# Patient Record
Sex: Male | Born: 1955 | Race: Black or African American | Hispanic: No | Smoking: Current every day smoker
Health system: Southern US, Community
[De-identification: ages and names within clinical notes are randomized; demographics above are authoritative.]

## PROBLEM LIST (undated history)

## (undated) DIAGNOSIS — B9681 Helicobacter pylori [H. pylori] as the cause of diseases classified elsewhere: Secondary | ICD-10-CM

## (undated) DIAGNOSIS — E785 Hyperlipidemia, unspecified: Secondary | ICD-10-CM

## (undated) DIAGNOSIS — M1990 Unspecified osteoarthritis, unspecified site: Secondary | ICD-10-CM

## (undated) DIAGNOSIS — E119 Type 2 diabetes mellitus without complications: Secondary | ICD-10-CM

## (undated) DIAGNOSIS — I10 Essential (primary) hypertension: Secondary | ICD-10-CM

## (undated) DIAGNOSIS — B1920 Unspecified viral hepatitis C without hepatic coma: Secondary | ICD-10-CM

## (undated) DIAGNOSIS — J42 Unspecified chronic bronchitis: Secondary | ICD-10-CM

## (undated) DIAGNOSIS — E871 Hypo-osmolality and hyponatremia: Secondary | ICD-10-CM

## (undated) DIAGNOSIS — K5900 Constipation, unspecified: Secondary | ICD-10-CM

## (undated) DIAGNOSIS — K652 Spontaneous bacterial peritonitis: Secondary | ICD-10-CM

## (undated) DIAGNOSIS — J189 Pneumonia, unspecified organism: Secondary | ICD-10-CM

## (undated) DIAGNOSIS — K219 Gastro-esophageal reflux disease without esophagitis: Secondary | ICD-10-CM

## (undated) DIAGNOSIS — R519 Headache, unspecified: Secondary | ICD-10-CM

## (undated) DIAGNOSIS — D689 Coagulation defect, unspecified: Secondary | ICD-10-CM

## (undated) DIAGNOSIS — D649 Anemia, unspecified: Secondary | ICD-10-CM

## (undated) DIAGNOSIS — K279 Peptic ulcer, site unspecified, unspecified as acute or chronic, without hemorrhage or perforation: Secondary | ICD-10-CM

## (undated) DIAGNOSIS — D62 Acute posthemorrhagic anemia: Secondary | ICD-10-CM

## (undated) DIAGNOSIS — R188 Other ascites: Secondary | ICD-10-CM

## (undated) DIAGNOSIS — T148XXA Other injury of unspecified body region, initial encounter: Secondary | ICD-10-CM

## (undated) DIAGNOSIS — IMO0002 Reserved for concepts with insufficient information to code with codable children: Secondary | ICD-10-CM

## (undated) DIAGNOSIS — F1010 Alcohol abuse, uncomplicated: Secondary | ICD-10-CM

## (undated) DIAGNOSIS — K254 Chronic or unspecified gastric ulcer with hemorrhage: Secondary | ICD-10-CM

## (undated) DIAGNOSIS — R51 Headache: Secondary | ICD-10-CM

## (undated) DIAGNOSIS — K7460 Unspecified cirrhosis of liver: Secondary | ICD-10-CM

## (undated) HISTORY — PX: TONSILLECTOMY: SUR1361

## (undated) HISTORY — PX: PARACENTESIS: SHX844

---
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
## 1999-02-21 ENCOUNTER — Encounter: Payer: Self-pay | Admitting: Family Medicine

## 1999-02-21 ENCOUNTER — Ambulatory Visit (HOSPITAL_COMMUNITY): Admission: RE | Admit: 1999-02-21 | Discharge: 1999-02-21 | Payer: Self-pay | Admitting: Family Medicine

## 2001-08-31 ENCOUNTER — Emergency Department (HOSPITAL_COMMUNITY): Admission: EM | Admit: 2001-08-31 | Discharge: 2001-08-31 | Payer: Self-pay

## 2001-10-08 ENCOUNTER — Encounter: Payer: Self-pay | Admitting: Emergency Medicine

## 2001-10-08 ENCOUNTER — Emergency Department (HOSPITAL_COMMUNITY): Admission: EM | Admit: 2001-10-08 | Discharge: 2001-10-08 | Payer: Self-pay | Admitting: Emergency Medicine

## 2002-07-30 ENCOUNTER — Emergency Department (HOSPITAL_COMMUNITY): Admission: EM | Admit: 2002-07-30 | Discharge: 2002-07-30 | Payer: Self-pay | Admitting: Emergency Medicine

## 2003-08-22 ENCOUNTER — Emergency Department (HOSPITAL_COMMUNITY): Admission: EM | Admit: 2003-08-22 | Discharge: 2003-08-22 | Payer: Self-pay

## 2003-09-15 ENCOUNTER — Emergency Department (HOSPITAL_COMMUNITY): Admission: EM | Admit: 2003-09-15 | Discharge: 2003-09-15 | Payer: Self-pay | Admitting: Emergency Medicine

## 2004-05-06 ENCOUNTER — Emergency Department (HOSPITAL_COMMUNITY): Admission: EM | Admit: 2004-05-06 | Discharge: 2004-05-06 | Payer: Self-pay | Admitting: Emergency Medicine

## 2012-04-22 ENCOUNTER — Encounter (HOSPITAL_COMMUNITY): Payer: Self-pay | Admitting: *Deleted

## 2012-04-22 ENCOUNTER — Emergency Department (HOSPITAL_COMMUNITY)
Admission: EM | Admit: 2012-04-22 | Discharge: 2012-04-22 | Disposition: A | Discharge status: Home / Self Care | Payer: Self-pay | Attending: Emergency Medicine | Admitting: Emergency Medicine

## 2012-04-22 DIAGNOSIS — R000 Tachycardia, unspecified: Secondary | ICD-10-CM | POA: Insufficient documentation

## 2012-04-22 DIAGNOSIS — R5381 Other malaise: Secondary | ICD-10-CM | POA: Insufficient documentation

## 2012-04-22 DIAGNOSIS — F17200 Nicotine dependence, unspecified, uncomplicated: Secondary | ICD-10-CM | POA: Insufficient documentation

## 2012-04-22 DIAGNOSIS — E119 Type 2 diabetes mellitus without complications: Secondary | ICD-10-CM | POA: Insufficient documentation

## 2012-04-22 DIAGNOSIS — R739 Hyperglycemia, unspecified: Secondary | ICD-10-CM

## 2012-04-22 DIAGNOSIS — I10 Essential (primary) hypertension: Secondary | ICD-10-CM | POA: Insufficient documentation

## 2012-04-22 DIAGNOSIS — Z7982 Long term (current) use of aspirin: Secondary | ICD-10-CM | POA: Insufficient documentation

## 2012-04-22 DIAGNOSIS — Z79899 Other long term (current) drug therapy: Secondary | ICD-10-CM | POA: Insufficient documentation

## 2012-04-22 HISTORY — DX: Essential (primary) hypertension: I10

## 2012-04-22 LAB — GLUCOSE, CAPILLARY: Glucose-Capillary: 224 mg/dL — ABNORMAL HIGH (ref 70–99)

## 2012-04-22 LAB — COMPREHENSIVE METABOLIC PANEL WITH GFR
ALT: 96 U/L — ABNORMAL HIGH (ref 0–53)
AST: 88 U/L — ABNORMAL HIGH (ref 0–37)
Albumin: 3 g/dL — ABNORMAL LOW (ref 3.5–5.2)
Alkaline Phosphatase: 398 U/L — ABNORMAL HIGH (ref 39–117)
BUN: 5 mg/dL — ABNORMAL LOW (ref 6–23)
CO2: 21 meq/L (ref 19–32)
Calcium: 9.2 mg/dL (ref 8.4–10.5)
Chloride: 99 meq/L (ref 96–112)
Creatinine, Ser: 0.68 mg/dL (ref 0.50–1.35)
GFR calc Af Amer: >90 mL/min
GFR calc non Af Amer: >90 mL/min
Glucose, Bld: 407 mg/dL — ABNORMAL HIGH (ref 70–99)
Potassium: 4.2 meq/L (ref 3.5–5.1)
Sodium: 134 meq/L — ABNORMAL LOW (ref 135–145)
Total Bilirubin: 0.7 mg/dL (ref 0.3–1.2)
Total Protein: 7 g/dL (ref 6.0–8.3)

## 2012-04-22 LAB — CBC WITH DIFFERENTIAL/PLATELET
Basophils Absolute: 0.1 10*3/uL (ref 0.0–0.1)
Basophils Relative: 1 % (ref 0–1)
Eosinophils Absolute: 0.2 10*3/uL (ref 0.0–0.7)
Eosinophils Relative: 2 % (ref 0–5)
HCT: 41.5 % (ref 39.0–52.0)
Hemoglobin: 14.8 g/dL (ref 13.0–17.0)
Lymphocytes Relative: 40 % (ref 12–46)
Lymphs Abs: 3.2 10*3/uL (ref 0.7–4.0)
MCH: 29.9 pg (ref 26.0–34.0)
MCHC: 35.7 g/dL (ref 30.0–36.0)
MCV: 83.8 fL (ref 78.0–100.0)
Monocytes Absolute: 0.7 10*3/uL (ref 0.1–1.0)
Monocytes Relative: 8 % (ref 3–12)
Neutro Abs: 3.9 10*3/uL (ref 1.7–7.7)
Neutrophils Relative %: 48 % (ref 43–77)
Platelets: 111 10*3/uL — ABNORMAL LOW (ref 150–400)
RBC: 4.95 MIL/uL (ref 4.22–5.81)
RDW: 14.9 % (ref 11.5–15.5)
WBC: 8 10*3/uL (ref 4.0–10.5)

## 2012-04-22 LAB — URINALYSIS, ROUTINE W REFLEX MICROSCOPIC
Bilirubin Urine: NEGATIVE
Glucose, UA: >1000 mg/dL — AB
Hgb urine dipstick: NEGATIVE
Ketones, ur: NEGATIVE mg/dL
pH: 5.5 (ref 5.0–8.0)

## 2012-04-22 LAB — URINE MICROSCOPIC-ADD ON

## 2012-04-22 MED ORDER — SODIUM CHLORIDE 0.9 % IV BOLUS (SEPSIS)
1000.0000 mL | Freq: Once | INTRAVENOUS | Status: AC
  Administered 2012-04-22: 1000 mL via INTRAVENOUS

## 2012-04-22 NOTE — ED Notes (Signed)
Patient states dizziness and weakness this am, patient seen at PCP and [facility]

## 2012-04-22 NOTE — ED Provider Notes (Signed)
History     CSN: [id number]  Arrival date & time 04/22/12  1315   First MD Initiated Contact with Patient 04/22/12 1553      Chief Complaint  Patient presents with  . Hyperglycemia    HPI  The patient presents with generalized complaints.  He notes that today, he gradually became aware of general weakness, mild disequilibrium.  The sensation persisted after the patient took a nap.  He had his blood sugar checked, found the elevated results concerning, and presents for evaluation.  During my evaluation he has no ongoing complaints and chronic lower extremity pain, attributed to neuropathy.  He currently denies any fevers, chills, nausea, vomiting, confusion, disequilibrium, disorientation or any other pain.  Past Medical History  Diagnosis Date  . Hypertension   . Diabetes mellitus without complication   . Hepatitis     Past Surgical History  Procedure Date  . Hernia repair   . Tonsillectomy     No family history on file.  History  Substance Use Topics  . Smoking status: Current Every Day Smoker  . Smokeless tobacco: Not on file  . Alcohol Use: Yes     socially      Review of Systems  Constitutional:       Per HPI, otherwise negative  HENT:       Per HPI, otherwise negative  Eyes: Negative.   Respiratory:       Per HPI, otherwise negative  Cardiovascular:       Per HPI, otherwise negative  Gastrointestinal: Negative for vomiting.  Genitourinary: Negative.   Musculoskeletal:       Per HPI, otherwise negative  Skin: Negative.   Neurological: Negative for syncope.    Allergies  Penicillins  Home Medications   Current Outpatient Rx  Name Route Sig Dispense Refill  . ACETAMINOPHEN 500 MG PO TABS Oral Take 500 mg by mouth every 8 (eight) hours as needed. For pain    . ASPIRIN EC 81 MG PO TBEC Oral Take 81 mg by mouth daily.    . OMEGA-3 FATTY ACIDS 1000 MG PO CAPS Oral Take 1 g by mouth 2 (two) times daily.    Marland Kitchen GABAPENTIN 300 MG PO CAPS Oral Take 600  mg by mouth 3 (three) times daily.    . GLYBURIDE 5 MG PO TABS Oral Take 10 mg by mouth 2 (two) times daily with a meal.    . LISINOPRIL 20 MG PO TABS Oral Take 10 mg by mouth daily.    Marland Kitchen METFORMIN HCL 1000 MG PO TABS Oral Take 1,000 mg by mouth 2 (two) times daily with a meal.    . POLYVINYL ALCOHOL 1.4 % OP SOLN Both Eyes Place 1 drop into both eyes daily as needed.    Marland Kitchen SILDENAFIL CITRATE 100 MG PO TABS Oral Take 50 mg by mouth daily as needed.      BP 109/78  Pulse 105  Temp 98.1 F (36.7 C) (Oral)  Resp 16  SpO2 99%  Physical Exam  Nursing note and vitals reviewed. Constitutional: He is oriented to person, place, and time. He appears well-developed. No distress.  HENT:  Head: Normocephalic and atraumatic.  Eyes: Conjunctivae normal and EOM are normal.  Cardiovascular: Regular rhythm.  Tachycardia present.   Pulmonary/Chest: Effort normal. No stridor. No respiratory distress.  Abdominal: He exhibits no distension.  Musculoskeletal: He exhibits no edema.  Neurological: He is alert and oriented to person, place, and time.  Skin: Skin is warm and  dry.  Psychiatric: He has a normal mood and affect.    ED Course  Procedures (including critical care time)  Labs Reviewed  CBC WITH DIFFERENTIAL - Abnormal; Notable for the following:    Platelets 111 (*)     All other components within normal limits  COMPREHENSIVE METABOLIC PANEL - Abnormal; Notable for the following:    Sodium 134 (*)     Glucose, Bld 407 (*)     BUN 5 (*)     Albumin 3.0 (*)     AST 88 (*)     ALT 96 (*)     Alkaline Phosphatase 398 (*)     All other components within normal limits  GLUCOSE, CAPILLARY - Abnormal; Notable for the following:    Glucose-Capillary 357 (*)     All other components within normal limits  GLUCOSE, CAPILLARY - Abnormal; Notable for the following:    Glucose-Capillary 270 (*)     All other components within normal limits  URINALYSIS, ROUTINE W REFLEX MICROSCOPIC   No results  found.   No diagnosis found.  Cardiac 101 sinus tach abnormal Pulse ox 99% room air normal   Date: 04/22/2012  Rate: 102  Rhythm: sinus tachycardia  QRS Axis: normal  Intervals: normal  ST/T Wave abnormalities: normal  Conduction Disutrbances:none  Narrative Interpretation:   Old EKG Reviewed: none available BORDERLINE   MDM  The patient presents with generalized complaints, and on initial exam is notably hyperglycemic.  Per the patient's ED stay he had no new events, noted that he felt better.  He repeat blood check demonstrated the patient's glucose was significantly more appropriate.  His muscles on the need for ongoing medication compliance, PND followup.  Absent distress, notable vital sign abnormalities, ongoing complaints, and with the established history of non-insulin-dependent diabetes the patient was discharged with expectation of close followup.    Gerhard Munch, MD 04/22/12 1800

## 2012-08-09 ENCOUNTER — Encounter (HOSPITAL_COMMUNITY): Payer: Self-pay | Admitting: Emergency Medicine

## 2012-08-09 ENCOUNTER — Emergency Department (HOSPITAL_COMMUNITY)
Admission: EM | Admit: 2012-08-09 | Discharge: 2012-08-10 | Disposition: A | Discharge status: Home / Self Care | Payer: Self-pay | Attending: Emergency Medicine | Admitting: Emergency Medicine

## 2012-08-09 DIAGNOSIS — N390 Urinary tract infection, site not specified: Secondary | ICD-10-CM | POA: Insufficient documentation

## 2012-08-09 DIAGNOSIS — E860 Dehydration: Secondary | ICD-10-CM | POA: Insufficient documentation

## 2012-08-09 DIAGNOSIS — F17200 Nicotine dependence, unspecified, uncomplicated: Secondary | ICD-10-CM | POA: Insufficient documentation

## 2012-08-09 DIAGNOSIS — R1013 Epigastric pain: Secondary | ICD-10-CM | POA: Insufficient documentation

## 2012-08-09 DIAGNOSIS — E119 Type 2 diabetes mellitus without complications: Secondary | ICD-10-CM | POA: Insufficient documentation

## 2012-08-09 DIAGNOSIS — J029 Acute pharyngitis, unspecified: Secondary | ICD-10-CM | POA: Insufficient documentation

## 2012-08-09 DIAGNOSIS — Z7982 Long term (current) use of aspirin: Secondary | ICD-10-CM | POA: Insufficient documentation

## 2012-08-09 DIAGNOSIS — Z79899 Other long term (current) drug therapy: Secondary | ICD-10-CM | POA: Insufficient documentation

## 2012-08-09 DIAGNOSIS — I10 Essential (primary) hypertension: Secondary | ICD-10-CM | POA: Insufficient documentation

## 2012-08-09 DIAGNOSIS — R112 Nausea with vomiting, unspecified: Secondary | ICD-10-CM | POA: Insufficient documentation

## 2012-08-09 LAB — URINALYSIS, ROUTINE W REFLEX MICROSCOPIC
Glucose, UA: 250 mg/dL — AB
Hgb urine dipstick: NEGATIVE
Specific Gravity, Urine: 1.033 — ABNORMAL HIGH (ref 1.005–1.030)

## 2012-08-09 LAB — COMPREHENSIVE METABOLIC PANEL
ALT: 120 U/L — ABNORMAL HIGH (ref 0–53)
AST: 142 U/L — ABNORMAL HIGH (ref 0–37)
CO2: 31 mEq/L (ref 19–32)
Calcium: 9.7 mg/dL (ref 8.4–10.5)
GFR calc non Af Amer: >90 mL/min (ref >90)
Sodium: 135 mEq/L (ref 135–145)

## 2012-08-09 LAB — CBC WITH DIFFERENTIAL/PLATELET
Basophils Absolute: 0.1 10*3/uL (ref 0.0–0.1)
Eosinophils Relative: 0 % (ref 0–5)
Lymphocytes Relative: 39 % (ref 12–46)
MCV: 83.4 fL (ref 78.0–100.0)
Neutro Abs: 4.9 10*3/uL (ref 1.7–7.7)
Neutrophils Relative %: 46 % (ref 43–77)
Platelets: 121 10*3/uL — ABNORMAL LOW (ref 150–400)
RDW: 15.4 % (ref 11.5–15.5)
WBC: 10.6 10*3/uL — ABNORMAL HIGH (ref 4.0–10.5)

## 2012-08-09 LAB — GLUCOSE, CAPILLARY: Glucose-Capillary: 245 mg/dL — ABNORMAL HIGH (ref 70–99)

## 2012-08-09 LAB — URINE MICROSCOPIC-ADD ON

## 2012-08-09 MED ORDER — DEXTROSE 5 % IV SOLN
1.0000 g | Freq: Once | INTRAVENOUS | Status: AC
  Administered 2012-08-09: 1 g via INTRAVENOUS
  Filled 2012-08-09: qty 10

## 2012-08-09 MED ORDER — IOHEXOL 300 MG/ML  SOLN
50.0000 mL | Freq: Once | INTRAMUSCULAR | Status: AC | PRN
  Administered 2012-08-09: 50 mL via ORAL

## 2012-08-09 MED ORDER — SODIUM CHLORIDE 0.9 % IV SOLN
INTRAVENOUS | Status: DC
  Administered 2012-08-09: 23:00:00 via INTRAVENOUS

## 2012-08-09 MED ORDER — SODIUM CHLORIDE 0.9 % IV BOLUS (SEPSIS)
1000.0000 mL | Freq: Once | INTRAVENOUS | Status: AC
  Administered 2012-08-09: 1000 mL via INTRAVENOUS

## 2012-08-09 MED ORDER — IOHEXOL 300 MG/ML  SOLN: 100.0000 mL | Freq: Once | INTRAMUSCULAR | Status: AC | PRN

## 2012-08-09 MED ORDER — ONDANSETRON HCL 4 MG/2ML IJ SOLN
4.0000 mg | Freq: Once | INTRAMUSCULAR | Status: AC
  Administered 2012-08-09: 4 mg via INTRAVENOUS
  Filled 2012-08-09: qty 2

## 2012-08-09 MED ORDER — SODIUM CHLORIDE 0.9 % IV BOLUS (SEPSIS)
500.0000 mL | Freq: Once | INTRAVENOUS | Status: AC
  Administered 2012-08-09: 500 mL via INTRAVENOUS

## 2012-08-09 NOTE — ED Notes (Signed)
Pt given water to drink PO.

## 2012-08-09 NOTE — ED Notes (Signed)
Pt made aware need for urine sample. Reports he hasn't been able to urinate all day but will try in a little.

## 2012-08-09 NOTE — ED Provider Notes (Signed)
History     CSN: [id number]  Arrival date & time 08/09/12  1843   First MD Initiated Contact with Patient 08/09/12 1854      Chief Complaint  Patient presents with  . Emesis    (Consider location/radiation/quality/duration/timing/severity/associated sxs/prior treatment) HPI Comments: Patient with h/o DM controlled on metformin, hepatitis -- presents with complaint of 3 days of persistent nausea and vomiting. Patient states that he is been unable to keep down solids or liquids other than a popsicle. He states that he has epigastric pain and throat pain from vomiting. Vomit has been clear. Patient had a BM yesterday and continues to pass gas.  Patient denies fever, cold symptoms, chest pain, shortness of breath, urinary symptoms. Denies GERD symptoms. No treatments prior to arrival. Patient states he has been unable to keep down his diabetic medications. Course is constant. Onset was acute. Nothing makes symptoms better or worse  The history is provided by the patient.    Past Medical History  Diagnosis Date  . Hypertension   . Diabetes mellitus without complication   . Hepatitis     Past Surgical History  Procedure Date  . Hernia repair   . Tonsillectomy     No family history on file.  History  Substance Use Topics  . Smoking status: Current Every Day Smoker  . Smokeless tobacco: Not on file  . Alcohol Use: Yes     Comment: socially      Review of Systems  Constitutional: Negative for fever.  HENT: Positive for sore throat. Negative for rhinorrhea.   Eyes: Negative for redness.  Respiratory: Negative for cough, choking and shortness of breath.   Cardiovascular: Negative for chest pain and leg swelling.  Gastrointestinal: Positive for nausea, vomiting and abdominal pain. Negative for diarrhea, constipation and blood in stool.  Genitourinary: Negative for dysuria.  Musculoskeletal: Negative for myalgias.  Skin: Negative for rash.  Neurological: Negative for  headaches.    Allergies  Penicillins  Home Medications   Current Outpatient Rx  Name  Route  Sig  Dispense  Refill  . ACETAMINOPHEN 500 MG PO TABS   Oral   Take 500 mg by mouth every 8 (eight) hours as needed. For pain         . ASPIRIN EC 81 MG PO TBEC   Oral   Take 81 mg by mouth daily.         . OMEGA-3 FATTY ACIDS 1000 MG PO CAPS   Oral   Take 1 g by mouth 2 (two) times daily.         Marland Kitchen GABAPENTIN 300 MG PO CAPS   Oral   Take 600 mg by mouth 3 (three) times daily.         . GLYBURIDE 5 MG PO TABS   Oral   Take 10 mg by mouth 2 (two) times daily with a meal.         . LISINOPRIL 20 MG PO TABS   Oral   Take 10 mg by mouth daily.         Marland Kitchen METFORMIN HCL 1000 MG PO TABS   Oral   Take 1,000 mg by mouth 2 (two) times daily with a meal.         . POLYVINYL ALCOHOL 1.4 % OP SOLN   Both Eyes   Place 1 drop into both eyes daily as needed.         Marland Kitchen SILDENAFIL CITRATE 100 MG PO TABS  Oral   Take 50 mg by mouth daily as needed.           BP 123/96  Pulse 121  Temp 98.3 F (36.8 C) (Oral)  Resp 16  Ht 5\' 9"  (1.753 m)  Wt 175 lb (79.379 kg)  BMI 25.84 kg/m2  SpO2 96%  Physical Exam  Nursing note and vitals reviewed. Constitutional: He appears well-developed and well-nourished.  HENT:  Head: Normocephalic and atraumatic.  Eyes: Conjunctivae normal are normal. Right eye exhibits no discharge. Left eye exhibits no discharge.  Neck: Normal range of motion. Neck supple.  Cardiovascular: Normal rate, regular rhythm and normal heart sounds.   Pulmonary/Chest: Effort normal and breath sounds normal.  Abdominal: Soft. Bowel sounds are normal. He exhibits no distension. There is tenderness. There is no rebound, no guarding and no CVA tenderness.    Neurological: He is alert.  Skin: Skin is warm and dry.  Psychiatric: He has a normal mood and affect.    ED Course  Procedures (including critical care time)  Labs Reviewed  GLUCOSE, CAPILLARY  - Abnormal; Notable for the following:    Glucose-Capillary 245 (*)     All other components within normal limits  CBC WITH DIFFERENTIAL - Abnormal; Notable for the following:    WBC 10.6 (*)     Platelets 121 (*)     Lymphs Abs 4.1 (*)     Monocytes Relative 14 (*)     Monocytes Absolute 1.5 (*)     All other components within normal limits  COMPREHENSIVE METABOLIC PANEL - Abnormal; Notable for the following:    Potassium 3.2 (*)     Chloride 94 (*)     Glucose, Bld 260 (*)     Albumin 3.2 (*)     AST 142 (*)     ALT 120 (*)     Alkaline Phosphatase 155 (*)     Total Bilirubin 2.3 (*)     All other components within normal limits  URINALYSIS, ROUTINE W REFLEX MICROSCOPIC - Abnormal; Notable for the following:    Color, Urine ORANGE (*)  BIOCHEMICALS MAY BE AFFECTED BY COLOR   APPearance CLOUDY (*)     Specific Gravity, Urine 1.033 (*)     Glucose, UA 250 (*)     Bilirubin Urine MODERATE (*)     Ketones, ur 15 (*)     Protein, ur 30 (*)     Urobilinogen, UA 4.0 (*)     Nitrite POSITIVE (*)     Leukocytes, UA SMALL (*)     All other components within normal limits  URINE MICROSCOPIC-ADD ON - Abnormal; Notable for the following:    Squamous Epithelial / LPF FEW (*)     Bacteria, UA MANY (*)     All other components within normal limits  LIPASE, BLOOD  URINE CULTURE   No results found.   1. UTI (lower urinary tract infection)   2. Dehydration     7:25 PM Patient seen and examined. Work-up initiated.    Vital signs reviewed and are as follows: Filed Vitals:   08/09/12 1848  BP: 123/96  Pulse: 121  Temp:   Resp:   BP 123/96  Pulse 121  Temp 98.3 F (36.8 C) (Oral)  Resp 16  Ht 5\' 9"  (1.753 m)  Wt 175 lb (79.379 kg)  BMI 25.84 kg/m2  SpO2 96%  9:50 PM Pain has seemed to worsen in ED. Patient d/w Dr. Rubin Payor who has seen. Will  get CT given tenderness. Awaiting UA.   11:25 PM UA showed likely UTI. HR improving. Pending CT. Concepcion Living NP who will f/u on  CT results. Patient has been drinking in room without vomiting.   Plan: CT. Treat any concerning findings. If neg, home with antiemetic, abx for complicated UTI.     MDM  N/V: gastroparesis v UTI. CT pending to rule out more concerning cause of abd pain. Dehydration/hyperglycemia: Do not suspect DKA, treated with fluids.  Transaminitis: chronic, 2/2 hepatitis        Renne Crigler, [place] 08/09/12 2327

## 2012-08-09 NOTE — ED Notes (Signed)
Pt reports he is here for n/v. Pt has been spitting into bag, no emesis seen. Pt in nad. Skin warm and dry. resp e/u.

## 2012-08-09 NOTE — ED Notes (Signed)
Pt finished drinking oral CT contrast, CT has been notified.  

## 2012-08-09 NOTE — ED Notes (Signed)
Pt able to keep fluids down.  

## 2012-08-09 NOTE — ED Notes (Signed)
Per report from [facility] pt has been having N/V x 4 days.  No distress noted.  Resp symmetrical and unlabored.  He states that the color is green now.  Mild dizziness on standing up.

## 2012-08-10 ENCOUNTER — Emergency Department (HOSPITAL_COMMUNITY): Payer: Self-pay

## 2012-08-10 MED ORDER — PROMETHAZINE HCL 25 MG PO TABS: 12.5000 mg | ORAL_TABLET | Freq: Four times a day (QID) | ORAL | Status: DC | PRN

## 2012-08-10 MED ORDER — IOHEXOL 300 MG/ML  SOLN
100.0000 mL | Freq: Once | INTRAMUSCULAR | Status: AC | PRN
  Administered 2012-08-10: 100 mL via INTRAVENOUS

## 2012-08-10 MED ORDER — SULFAMETHOXAZOLE-TRIMETHOPRIM 800-160 MG PO TABS: 1.0000 | ORAL_TABLET | Freq: Two times a day (BID) | ORAL | Status: DC

## 2012-08-10 NOTE — ED Provider Notes (Signed)
Medical screening examination/treatment/procedure(s) were performed by non-physician practitioner and as supervising physician I was immediately available for consultation/collaboration.  Sunnie Nielsen, MD 08/10/12 0530

## 2012-08-10 NOTE — ED Provider Notes (Signed)
  Physical Exam  BP 113/73  Pulse 98  Temp 98.3 F (36.8 C) (Oral)  Resp 19  Ht 5\' 9"  (1.753 m)  Wt 175 lb (79.379 kg)  BMI 25.84 kg/m2  SpO2 95%  Physical Exam Asked to review CT Scan  ED Course  Procedures  MDM Ct scan reviewed no acute findings       Arman Filter, NP 08/10/12 0150

## 2012-08-10 NOTE — ED Provider Notes (Signed)
Medical screening examination/treatment/procedure(s) were performed by non-physician practitioner and as supervising physician I was immediately available for consultation/collaboration.  Verena Shawgo, MD 08/10/12 0530 

## 2012-08-11 LAB — URINE CULTURE

## 2012-08-12 ENCOUNTER — Telehealth (HOSPITAL_COMMUNITY): Payer: Self-pay | Admitting: Emergency Medicine

## 2012-08-12 NOTE — ED Notes (Signed)
Results received from [facility]. (+) URNC -> >/= 100,000 colonies, Staph. Species.  Rx given in ED for Sulfa Trimeth -> No Sens. for this drug listed.  Chart to MD office for review.

## 2012-08-13 NOTE — ED Notes (Signed)
Chart returned from EDP office written rx by Lemont Fillers for Macrobid 100 mg tab # 14 one tab po BID x 7 days.

## 2012-08-14 ENCOUNTER — Telehealth (HOSPITAL_COMMUNITY): Payer: Self-pay | Admitting: Emergency Medicine

## 2012-08-14 NOTE — ED Notes (Signed)
Patient notified of + urine culture. RX Macrobid called to [facility] [phone].

## 2013-01-11 DIAGNOSIS — T148XXA Other injury of unspecified body region, initial encounter: Secondary | ICD-10-CM

## 2013-01-11 DIAGNOSIS — D689 Coagulation defect, unspecified: Secondary | ICD-10-CM

## 2013-01-11 DIAGNOSIS — K7460 Unspecified cirrhosis of liver: Secondary | ICD-10-CM

## 2013-01-11 DIAGNOSIS — E871 Hypo-osmolality and hyponatremia: Secondary | ICD-10-CM

## 2013-01-11 DIAGNOSIS — R188 Other ascites: Secondary | ICD-10-CM

## 2013-01-11 HISTORY — DX: Unspecified cirrhosis of liver: K74.60

## 2013-01-11 HISTORY — DX: Hypo-osmolality and hyponatremia: E87.1

## 2013-01-11 HISTORY — DX: Other injury of unspecified body region, initial encounter: T14.8XXA

## 2013-01-11 HISTORY — DX: Other ascites: R18.8

## 2013-01-11 HISTORY — DX: Coagulation defect, unspecified: D68.9

## 2013-02-04 ENCOUNTER — Emergency Department (HOSPITAL_COMMUNITY): Payer: Self-pay

## 2013-02-04 ENCOUNTER — Encounter (HOSPITAL_COMMUNITY): Payer: Self-pay | Admitting: *Deleted

## 2013-02-04 ENCOUNTER — Emergency Department (HOSPITAL_COMMUNITY)
Admission: EM | Admit: 2013-02-04 | Discharge: 2013-02-05 | Disposition: A | Discharge status: Home / Self Care | Payer: Self-pay | Attending: Emergency Medicine | Admitting: Emergency Medicine

## 2013-02-04 DIAGNOSIS — E119 Type 2 diabetes mellitus without complications: Secondary | ICD-10-CM | POA: Insufficient documentation

## 2013-02-04 DIAGNOSIS — W19XXXA Unspecified fall, initial encounter: Secondary | ICD-10-CM | POA: Insufficient documentation

## 2013-02-04 DIAGNOSIS — Y939 Activity, unspecified: Secondary | ICD-10-CM | POA: Insufficient documentation

## 2013-02-04 DIAGNOSIS — Z7982 Long term (current) use of aspirin: Secondary | ICD-10-CM | POA: Insufficient documentation

## 2013-02-04 DIAGNOSIS — Y929 Unspecified place or not applicable: Secondary | ICD-10-CM | POA: Insufficient documentation

## 2013-02-04 DIAGNOSIS — S301XXA Contusion of abdominal wall, initial encounter: Secondary | ICD-10-CM | POA: Insufficient documentation

## 2013-02-04 DIAGNOSIS — T148XXA Other injury of unspecified body region, initial encounter: Secondary | ICD-10-CM

## 2013-02-04 DIAGNOSIS — Z8719 Personal history of other diseases of the digestive system: Secondary | ICD-10-CM | POA: Insufficient documentation

## 2013-02-04 DIAGNOSIS — Z79899 Other long term (current) drug therapy: Secondary | ICD-10-CM | POA: Insufficient documentation

## 2013-02-04 DIAGNOSIS — F17200 Nicotine dependence, unspecified, uncomplicated: Secondary | ICD-10-CM | POA: Insufficient documentation

## 2013-02-04 DIAGNOSIS — Z880 Allergy status to penicillin: Secondary | ICD-10-CM | POA: Insufficient documentation

## 2013-02-04 DIAGNOSIS — I10 Essential (primary) hypertension: Secondary | ICD-10-CM | POA: Insufficient documentation

## 2013-02-04 LAB — COMPREHENSIVE METABOLIC PANEL
ALT: 63 U/L — ABNORMAL HIGH (ref 0–53)
AST: 111 U/L — ABNORMAL HIGH (ref 0–37)
CO2: 24 mEq/L (ref 19–32)
Calcium: 8 mg/dL — ABNORMAL LOW (ref 8.4–10.5)
GFR calc non Af Amer: >90 mL/min (ref >90)
Sodium: 131 mEq/L — ABNORMAL LOW (ref 135–145)

## 2013-02-04 LAB — CBC WITH DIFFERENTIAL/PLATELET
Eosinophils Relative: 2 % (ref 0–5)
Lymphocytes Relative: 33 % (ref 12–46)
Monocytes Absolute: 1.3 10*3/uL — ABNORMAL HIGH (ref 0.1–1.0)
Monocytes Relative: 17 % — ABNORMAL HIGH (ref 3–12)
Neutrophils Relative %: 47 % (ref 43–77)
Platelets: 166 10*3/uL (ref 150–400)
RBC: 4.3 MIL/uL (ref 4.22–5.81)
WBC: 7.8 10*3/uL (ref 4.0–10.5)

## 2013-02-04 LAB — GLUCOSE, CAPILLARY: Glucose-Capillary: 162 mg/dL — ABNORMAL HIGH (ref 70–99)

## 2013-02-04 LAB — URINALYSIS, ROUTINE W REFLEX MICROSCOPIC
Glucose, UA: NEGATIVE mg/dL
Hgb urine dipstick: NEGATIVE
Protein, ur: NEGATIVE mg/dL
Specific Gravity, Urine: 1.017 (ref 1.005–1.030)

## 2013-02-04 MED ORDER — HYDROMORPHONE HCL PF 1 MG/ML IJ SOLN
1.0000 mg | Freq: Once | INTRAMUSCULAR | Status: AC
  Administered 2013-02-04: 1 mg via INTRAVENOUS
  Filled 2013-02-04: qty 1

## 2013-02-04 MED ORDER — IOHEXOL 300 MG/ML  SOLN
100.0000 mL | Freq: Once | INTRAMUSCULAR | Status: AC | PRN
  Administered 2013-02-04: 100 mL via INTRAVENOUS

## 2013-02-04 MED ORDER — SODIUM CHLORIDE 0.9 % IV SOLN
Freq: Once | INTRAVENOUS | Status: AC
  Administered 2013-02-04: 22:00:00 via INTRAVENOUS

## 2013-02-04 NOTE — ED Notes (Signed)
Pt reports having a fall last wed and still having pain to right flank and big toe. Large hematoma noted to right side. Reports swelling and drainage from right big toe.

## 2013-02-04 NOTE — ED Notes (Signed)
CBG checked 162

## 2013-02-05 MED ORDER — SULFAMETHOXAZOLE-TRIMETHOPRIM 800-160 MG PO TABS: 1.0000 | ORAL_TABLET | Freq: Two times a day (BID) | ORAL | Status: DC

## 2013-02-05 MED ORDER — CEPHALEXIN 500 MG PO CAPS: 500.0000 mg | ORAL_CAPSULE | Freq: Four times a day (QID) | ORAL | Status: DC

## 2013-02-05 MED ORDER — HYDROCODONE-ACETAMINOPHEN 5-325 MG PO TABS: 1.0000 | ORAL_TABLET | Freq: Four times a day (QID) | ORAL | Status: DC | PRN

## 2013-02-05 NOTE — ED Provider Notes (Signed)
CSN: [id number]     Arrival date & time 02/04/13  1539 History     First MD Initiated Contact with Patient 02/04/13 2045     Chief Complaint  Patient presents with  . Fall   (Consider location/radiation/quality/duration/timing/severity/associated sxs/prior Treatment) HPI Comments: Pt comes in with cc of fall. Pt has hx of liver cirrhosis. Pt had a fall last week. States that since then he has been having right great toe pain, and some abd pain. The toe swelling is associated with some clear drainage. No purulent discharge. His nail is slightly loose as well. Pt also has right sided flank and abd pain. He has a large bruise at the site, the pain and the redness is worse per patient.  Patient is a 57 y.o. male presenting with fall. The history is provided by the patient.  Fall Associated symptoms include abdominal pain. Pertinent negatives include no chest pain and no shortness of breath.    Past Medical History  Diagnosis Date  . Hypertension   . Diabetes mellitus without complication   . Hepatitis   . Enlarged liver    Past Surgical History  Procedure Laterality Date  . Hernia repair    . Tonsillectomy     History reviewed. No pertinent family history. History  Substance Use Topics  . Smoking status: Current Every Day Smoker  . Smokeless tobacco: Not on file  . Alcohol Use: Yes     Comment: socially    Review of Systems  Constitutional: Negative for activity change and appetite change.  Respiratory: Negative for cough and shortness of breath.   Cardiovascular: Negative for chest pain.  Gastrointestinal: Positive for abdominal pain.  Genitourinary: Negative for dysuria.  Musculoskeletal: Positive for arthralgias.  Skin: Positive for rash.  Hematological: Does not bruise/bleed easily.    Allergies  Penicillins  Home Medications   Current Outpatient Rx  Name  Route  Sig  Dispense  Refill  . acetaminophen (TYLENOL) 500 MG tablet   Oral   Take 500 mg by mouth  every 8 (eight) hours as needed for pain.          Marland Kitchen aspirin EC 81 MG tablet   Oral   Take 81 mg by mouth daily.         . fish oil-omega-3 fatty acids 1000 MG capsule   Oral   Take 1 g by mouth 2 (two) times daily.         Marland Kitchen glyBURIDE (DIABETA) 5 MG tablet   Oral   Take 7.5 mg by mouth daily with breakfast.         . lisinopril (PRINIVIL,ZESTRIL) 20 MG tablet   Oral   Take 10 mg by mouth daily.         . metFORMIN (GLUCOPHAGE) 1000 MG tablet   Oral   Take 1,000 mg by mouth 2 (two) times daily with a meal.         . polyvinyl alcohol (LIQUIFILM TEARS) 1.4 % ophthalmic solution   Both Eyes   Place 1 drop into both eyes daily as needed (dry eyes).          . promethazine (PHENERGAN) 25 MG tablet   Oral   Take 12.5 mg by mouth every 6 (six) hours as needed for nausea.         . sildenafil (VIAGRA) 100 MG tablet   Oral   Take 50 mg by mouth daily as needed for erectile dysfunction.          Marland Kitchen  HYDROcodone-acetaminophen (NORCO/VICODIN) 5-325 MG per tablet   Oral   Take 1 tablet by mouth every 6 (six) hours as needed for pain.   15 tablet   0    BP 119/84  Pulse 98  Temp(Src) 98 F (36.7 C) (Oral)  Resp 20  SpO2 97% Physical Exam  Nursing note and vitals reviewed. Constitutional: He is oriented to person, place, and time. He appears well-developed.  HENT:  Head: Normocephalic and atraumatic.  Eyes: Conjunctivae and EOM are normal. Pupils are equal, round, and reactive to light.  Neck: Normal range of motion. Neck supple.  Cardiovascular: Normal rate and regular rhythm.   Pulmonary/Chest: Effort normal and breath sounds normal.  Abdominal: Soft. Bowel sounds are normal. He exhibits no distension. There is tenderness. There is no rebound and no guarding.  Large ecchymoses to the right flank region, extending anteriorly.  Musculoskeletal:  Right great toe - swelling, with some fluid pitting and loose toe nail, with no compromise at the nail bed. Pt  has no erythema. No purulent discharge.  Neurological: He is alert and oriented to person, place, and time.  Skin: Skin is warm. Rash noted.    ED Course   Procedures (including critical care time)  Labs Reviewed  GLUCOSE, CAPILLARY - Abnormal; Notable for the following:    Glucose-Capillary 162 (*)    All other components within normal limits  CBC WITH DIFFERENTIAL - Abnormal; Notable for the following:    Hemoglobin 12.7 (*)    HCT 34.4 (*)    MCHC 36.9 (*)    RDW 16.0 (*)    Monocytes Relative 17 (*)    Monocytes Absolute 1.3 (*)    All other components within normal limits  COMPREHENSIVE METABOLIC PANEL - Abnormal; Notable for the following:    Sodium 131 (*)    Glucose, Bld 148 (*)    Calcium 8.0 (*)    Albumin 2.1 (*)    AST 111 (*)    ALT 63 (*)    Alkaline Phosphatase 176 (*)    Total Bilirubin 1.6 (*)    All other components within normal limits  URINALYSIS, ROUTINE W REFLEX MICROSCOPIC - Abnormal; Notable for the following:    Color, Urine AMBER (*)    Bilirubin Urine SMALL (*)    Urobilinogen, UA >8.0 (*)    All other components within normal limits  SEDIMENTATION RATE   Ct Abdomen Pelvis W Contrast  02/05/2013   *RADIOLOGY REPORT*  Clinical Data: Right sided hematoma and decreased hemoglobin after fall.  CT ABDOMEN AND PELVIS WITH CONTRAST  Technique:  Multidetector CT imaging of the abdomen and pelvis was performed following the standard protocol during bolus administration of intravenous contrast.  Contrast: OMNIPAQUE IOHEXOL 300 MG/ML  SOLN  Comparison: 08/10/2012  Findings: There is a linear atelectasis or fibrosis in the lung bases.  Changes of hepatic cirrhosis with enlarged lateral segment left lobe and caudate lobe.  Nodular contour of the liver.  Gallbladder wall is thickened which may be response to ascites and cirrhosis. Spleen size is normal.  Flow is demonstrated in the portal veins. There is interval development of upper abdominal fluid  collections. Density measurements are consistent with ascites.  Fluid extends throughout the abdomen and into the pelvis.  The pancreas, kidneys, inferior vena cava, and retroperitoneal lymph nodes are unremarkable.  Calcification of the abdominal aorta without aneurysm.  There is a right adrenal gland nodule measuring 1.5 cm diameter.  This is stable since previous study  and probably represents an adenoma.  Mildly prominent lymph nodes in the celiac axis likely related to reactive change due to of cirrhosis. Gastric and splenic varices.  The stomach and small bowel are decompressed.  Stool filled colon without abnormal distension.  No free air in the abdomen.  Abdominal wall musculature appears intact.  Pelvis:  Pelvic ascites as previously indicated.  Fat containing left inguinal hernia appears stable.  Bladder wall is not thickened.  Prostate gland is not enlarged.  No significant pelvic lymphadenopathy.  The appendix appears normal.  Degenerative changes in the lumbar spine with normal alignment. Subcutaneous soft tissue hematoma in the right flank region inferior to the right 12th rib.  This measures about 4.2 x 8.6 by 3.1 cm.  No displaced lumbar spine, pelvic, or rib fractures are identified.  IMPRESSION: Hepatic cirrhosis with developing diffuse abdominal and pelvic ascites.  Gallbladder wall thickening is probably reactive. Reactive lymph nodes in the celiac axis.  The upper abdominal varices.  Subcutaneous soft tissue hematoma in the right posterior flank region.  No displaced fractures identified.   Original Report Authenticated By: Burman Nieves, M.D.   Dg Toe Great Right  02/04/2013   *RADIOLOGY REPORT*  Clinical Data: Post fall, now with pain involving the right great toe at the level of the nail bed  RIGHT GREAT TOE  Comparison: None.  Findings:  No fracture or dislocation.  Joint spaces are preserved.  Regional soft tissues are normal.  No radiopaque foreign body.  IMPRESSION: No fracture,  dislocation or radiopaque foreign body.   Original Report Authenticated By: Tacey Ruiz, MD   1. Hematoma   2. Fall, initial encounter     MDM  Pt comes in with cc of fall. Pt has hx of liver dz, he has a large bruise to the right flank region. Pt's Hb is 4 grams less than last visit - CT was ordered to ensure there was no renal or liver injury/ hematoma, and no retroperitoneal bleed. CT is negative, just shows hematoma.  Rest of the exam shows toe swelling, with no clear signs of infection. Given he is diabetic and has liver dz, and the injury is in the periphery, we will get him some po antibiotics.  I have requested patient to see pcp closely, or to return to the ER if the symptoms are getting worse.  Derwood Kaplan, MD 02/05/13 [phone]

## 2013-02-11 DIAGNOSIS — D649 Anemia, unspecified: Secondary | ICD-10-CM

## 2013-02-11 HISTORY — DX: Anemia, unspecified: D64.9

## 2013-03-02 ENCOUNTER — Emergency Department (HOSPITAL_COMMUNITY): Payer: Non-veteran care

## 2013-03-02 ENCOUNTER — Inpatient Hospital Stay (HOSPITAL_COMMUNITY)
Admission: EM | Admit: 2013-03-02 | Discharge: 2013-03-11 | DRG: 377 | Disposition: A | Discharge status: Skilled Nursing Facility | Payer: Non-veteran care | Attending: Internal Medicine | Admitting: Internal Medicine

## 2013-03-02 ENCOUNTER — Encounter (HOSPITAL_COMMUNITY): Payer: Self-pay | Admitting: Emergency Medicine

## 2013-03-02 DIAGNOSIS — R748 Abnormal levels of other serum enzymes: Secondary | ICD-10-CM

## 2013-03-02 DIAGNOSIS — K759 Inflammatory liver disease, unspecified: Secondary | ICD-10-CM

## 2013-03-02 DIAGNOSIS — E871 Hypo-osmolality and hyponatremia: Secondary | ICD-10-CM | POA: Diagnosis present

## 2013-03-02 DIAGNOSIS — D689 Coagulation defect, unspecified: Secondary | ICD-10-CM

## 2013-03-02 DIAGNOSIS — K766 Portal hypertension: Secondary | ICD-10-CM | POA: Diagnosis present

## 2013-03-02 DIAGNOSIS — E46 Unspecified protein-calorie malnutrition: Secondary | ICD-10-CM | POA: Diagnosis present

## 2013-03-02 DIAGNOSIS — F1020 Alcohol dependence, uncomplicated: Secondary | ICD-10-CM | POA: Diagnosis present

## 2013-03-02 DIAGNOSIS — B1921 Unspecified viral hepatitis C with hepatic coma: Secondary | ICD-10-CM | POA: Diagnosis present

## 2013-03-02 DIAGNOSIS — I8510 Secondary esophageal varices without bleeding: Secondary | ICD-10-CM | POA: Diagnosis present

## 2013-03-02 DIAGNOSIS — R0989 Other specified symptoms and signs involving the circulatory and respiratory systems: Secondary | ICD-10-CM | POA: Clinically undetermined

## 2013-03-02 DIAGNOSIS — I10 Essential (primary) hypertension: Secondary | ICD-10-CM | POA: Diagnosis present

## 2013-03-02 DIAGNOSIS — K7030 Alcoholic cirrhosis of liver without ascites: Secondary | ICD-10-CM | POA: Diagnosis present

## 2013-03-02 DIAGNOSIS — D72829 Elevated white blood cell count, unspecified: Secondary | ICD-10-CM

## 2013-03-02 DIAGNOSIS — K7010 Alcoholic hepatitis without ascites: Secondary | ICD-10-CM | POA: Diagnosis present

## 2013-03-02 DIAGNOSIS — R5381 Other malaise: Secondary | ICD-10-CM | POA: Diagnosis present

## 2013-03-02 DIAGNOSIS — IMO0002 Reserved for concepts with insufficient information to code with codable children: Secondary | ICD-10-CM

## 2013-03-02 DIAGNOSIS — T148XXA Other injury of unspecified body region, initial encounter: Secondary | ICD-10-CM | POA: Diagnosis present

## 2013-03-02 DIAGNOSIS — E119 Type 2 diabetes mellitus without complications: Secondary | ICD-10-CM

## 2013-03-02 DIAGNOSIS — J9600 Acute respiratory failure, unspecified whether with hypoxia or hypercapnia: Secondary | ICD-10-CM | POA: Diagnosis present

## 2013-03-02 DIAGNOSIS — J9601 Acute respiratory failure with hypoxia: Secondary | ICD-10-CM

## 2013-03-02 DIAGNOSIS — B9681 Helicobacter pylori [H. pylori] as the cause of diseases classified elsewhere: Secondary | ICD-10-CM

## 2013-03-02 DIAGNOSIS — R000 Tachycardia, unspecified: Secondary | ICD-10-CM

## 2013-03-02 DIAGNOSIS — K7011 Alcoholic hepatitis with ascites: Secondary | ICD-10-CM

## 2013-03-02 DIAGNOSIS — D62 Acute posthemorrhagic anemia: Secondary | ICD-10-CM | POA: Diagnosis present

## 2013-03-02 DIAGNOSIS — R791 Abnormal coagulation profile: Secondary | ICD-10-CM | POA: Diagnosis present

## 2013-03-02 DIAGNOSIS — K7290 Hepatic failure, unspecified without coma: Secondary | ICD-10-CM

## 2013-03-02 DIAGNOSIS — B1920 Unspecified viral hepatitis C without hepatic coma: Secondary | ICD-10-CM | POA: Diagnosis present

## 2013-03-02 DIAGNOSIS — F17200 Nicotine dependence, unspecified, uncomplicated: Secondary | ICD-10-CM | POA: Diagnosis present

## 2013-03-02 DIAGNOSIS — K254 Chronic or unspecified gastric ulcer with hemorrhage: Principal | ICD-10-CM | POA: Diagnosis present

## 2013-03-02 DIAGNOSIS — B182 Chronic viral hepatitis C: Secondary | ICD-10-CM

## 2013-03-02 DIAGNOSIS — K920 Hematemesis: Secondary | ICD-10-CM

## 2013-03-02 DIAGNOSIS — K922 Gastrointestinal hemorrhage, unspecified: Secondary | ICD-10-CM

## 2013-03-02 DIAGNOSIS — A048 Other specified bacterial intestinal infections: Secondary | ICD-10-CM | POA: Diagnosis present

## 2013-03-02 DIAGNOSIS — I8500 Esophageal varices without bleeding: Secondary | ICD-10-CM

## 2013-03-02 DIAGNOSIS — R188 Other ascites: Secondary | ICD-10-CM

## 2013-03-02 HISTORY — DX: Unspecified cirrhosis of liver: K74.60

## 2013-03-02 HISTORY — DX: Other injury of unspecified body region, initial encounter: T14.8XXA

## 2013-03-02 HISTORY — DX: Hypo-osmolality and hyponatremia: E87.1

## 2013-03-02 HISTORY — DX: Coagulation defect, unspecified: D68.9

## 2013-03-02 HISTORY — DX: Alcohol abuse, uncomplicated: F10.10

## 2013-03-02 HISTORY — DX: Other ascites: R18.8

## 2013-03-02 HISTORY — DX: Anemia, unspecified: D64.9

## 2013-03-02 LAB — PROTIME-INR
INR: 1.83 — ABNORMAL HIGH (ref 0.00–1.49)
Prothrombin Time: 20.6 seconds — ABNORMAL HIGH (ref 11.6–15.2)

## 2013-03-02 LAB — CBC WITH DIFFERENTIAL/PLATELET
Eosinophils Relative: 1 % (ref 0–5)
HCT: 30.2 % — ABNORMAL LOW (ref 39.0–52.0)
Lymphocytes Relative: 29 % (ref 12–46)
Lymphs Abs: 2.8 10*3/uL (ref 0.7–4.0)
MCV: 83.7 fL (ref 78.0–100.0)
Monocytes Absolute: 1.3 10*3/uL — ABNORMAL HIGH (ref 0.1–1.0)
Monocytes Relative: 14 % — ABNORMAL HIGH (ref 3–12)
RBC: 3.61 MIL/uL — ABNORMAL LOW (ref 4.22–5.81)
WBC: 9.6 10*3/uL (ref 4.0–10.5)

## 2013-03-02 LAB — COMPREHENSIVE METABOLIC PANEL WITH GFR
ALT: 41 U/L (ref 0–53)
AST: 67 U/L — ABNORMAL HIGH (ref 0–37)
Albumin: 2 g/dL — ABNORMAL LOW (ref 3.5–5.2)
Alkaline Phosphatase: 104 U/L (ref 39–117)
BUN: 17 mg/dL (ref 6–23)
CO2: 25 meq/L (ref 19–32)
Calcium: 8.4 mg/dL (ref 8.4–10.5)
Chloride: 104 meq/L (ref 96–112)
Creatinine, Ser: 1 mg/dL (ref 0.50–1.35)
GFR calc Af Amer: >90 mL/min (ref >90)
GFR calc non Af Amer: 80 mL/min — ABNORMAL LOW (ref >90)
Glucose, Bld: 164 mg/dL — ABNORMAL HIGH (ref 70–99)
Potassium: 4 meq/L (ref 3.5–5.1)
Sodium: 137 meq/L (ref 135–145)
Total Bilirubin: 1.3 mg/dL — ABNORMAL HIGH (ref 0.3–1.2)
Total Protein: 5.9 g/dL — ABNORMAL LOW (ref 6.0–8.3)

## 2013-03-02 LAB — TYPE AND SCREEN
ABO/RH(D): O POS
Antibody Screen: NEGATIVE

## 2013-03-02 LAB — LACTIC ACID, PLASMA: Lactic Acid, Venous: 4.7 mmol/L — ABNORMAL HIGH (ref 0.5–2.2)

## 2013-03-02 LAB — ABO/RH: ABO/RH(D): O POS

## 2013-03-02 LAB — LIPASE, BLOOD: Lipase: 71 U/L — ABNORMAL HIGH (ref 11–59)

## 2013-03-02 LAB — OCCULT BLOOD, POC DEVICE: Fecal Occult Bld: POSITIVE — AB

## 2013-03-02 LAB — APTT: aPTT: 34 s (ref 24–37)

## 2013-03-02 MED ORDER — SODIUM CHLORIDE 0.9 % IJ SOLN
3.0000 mL | Freq: Two times a day (BID) | INTRAMUSCULAR | Status: DC
  Administered 2013-03-05 – 2013-03-10 (×7): 3 mL via INTRAVENOUS

## 2013-03-02 MED ORDER — SODIUM CHLORIDE 0.9 % IV SOLN
1000.0000 mL | Freq: Once | INTRAVENOUS | Status: AC
  Administered 2013-03-02: 1000 mL via INTRAVENOUS

## 2013-03-02 MED ORDER — SODIUM CHLORIDE 0.9 % IV BOLUS (SEPSIS)
1000.0000 mL | Freq: Once | INTRAVENOUS | Status: AC
  Administered 2013-03-02: 1000 mL via INTRAVENOUS

## 2013-03-02 MED ORDER — MORPHINE SULFATE 4 MG/ML IJ SOLN
4.0000 mg | Freq: Once | INTRAMUSCULAR | Status: AC
  Administered 2013-03-02: 4 mg via INTRAVENOUS
  Filled 2013-03-02: qty 1

## 2013-03-02 MED ORDER — INSULIN ASPART 100 UNIT/ML ~~LOC~~ SOLN
0.0000 [IU] | SUBCUTANEOUS | Status: DC
  Administered 2013-03-03: 3 [IU] via SUBCUTANEOUS
  Administered 2013-03-03: 2 [IU] via SUBCUTANEOUS
  Administered 2013-03-04: 3 [IU] via SUBCUTANEOUS
  Administered 2013-03-04: 2 [IU] via SUBCUTANEOUS

## 2013-03-02 MED ORDER — ONDANSETRON HCL 4 MG/2ML IJ SOLN
4.0000 mg | Freq: Once | INTRAMUSCULAR | Status: AC
Start: 2013-03-02 — End: 2013-03-02
  Administered 2013-03-02: 4 mg via INTRAVENOUS
  Filled 2013-03-02: qty 2

## 2013-03-02 MED ORDER — SODIUM CHLORIDE 0.9 % IV SOLN
8.0000 mg/h | INTRAVENOUS | Status: AC
  Administered 2013-03-02 – 2013-03-05 (×5): 8 mg/h via INTRAVENOUS
  Filled 2013-03-02 (×12): qty 80

## 2013-03-02 MED ORDER — SODIUM CHLORIDE 0.9 % IV SOLN
50.0000 ug/h | INTRAVENOUS | Status: DC
  Administered 2013-03-02 – 2013-03-04 (×3): 50 ug/h via INTRAVENOUS
  Filled 2013-03-02 (×8): qty 1

## 2013-03-02 MED ORDER — GABAPENTIN 300 MG PO CAPS
600.0000 mg | ORAL_CAPSULE | Freq: Three times a day (TID) | ORAL | Status: DC
  Administered 2013-03-03 – 2013-03-04 (×3): 600 mg via ORAL
  Filled 2013-03-02 (×7): qty 2

## 2013-03-02 MED ORDER — SODIUM CHLORIDE 0.9 % IV SOLN
1000.0000 mL | INTRAVENOUS | Status: DC
  Administered 2013-03-03: 1000 mL via INTRAVENOUS

## 2013-03-02 MED ORDER — ACETAMINOPHEN 325 MG PO TABS
650.0000 mg | ORAL_TABLET | Freq: Three times a day (TID) | ORAL | Status: DC | PRN
  Administered 2013-03-04: 650 mg via ORAL
  Filled 2013-03-02: qty 2

## 2013-03-02 MED ORDER — SODIUM CHLORIDE 0.9 % IV SOLN
80.0000 mg | Freq: Once | INTRAVENOUS | Status: AC
  Administered 2013-03-02: 80 mg via INTRAVENOUS
  Filled 2013-03-02: qty 80

## 2013-03-02 MED ORDER — POLYVINYL ALCOHOL 1.4 % OP SOLN
1.0000 [drp] | Freq: Every day | OPHTHALMIC | Status: DC | PRN
  Filled 2013-03-02: qty 15

## 2013-03-02 MED ORDER — SODIUM CHLORIDE 0.9 % IV SOLN: 80.0000 mg | Freq: Once | INTRAVENOUS | Status: DC

## 2013-03-02 MED ORDER — MORPHINE SULFATE 4 MG/ML IJ SOLN
4.0000 mg | Freq: Once | INTRAMUSCULAR | Status: AC
  Administered 2013-03-03: 4 mg via INTRAVENOUS
  Filled 2013-03-02 (×2): qty 1

## 2013-03-02 NOTE — ED Notes (Signed)
Patient reports that for the last 2 weeks has had abdominal swelling and pan to his left side. The patent report that he is having chest pan as well.

## 2013-03-02 NOTE — ED Provider Notes (Signed)
CSN: [id number]     Arrival date & time 03/02/13  1827 History     First MD Initiated Contact with Patient 03/02/13 1829     Chief Complaint  Patient presents with  . Abdominal Pain   (Consider location/radiation/quality/duration/timing/severity/associated sxs/prior Treatment) HPI  Curtis Estrada is a 57 y.o. male  with a hx of hepatitis, alcohol abuse DM, HTN presents to the Emergency Department complaining of gradual, persistent, progressively worsening abdominal swelling and pain for the last 2 weeks with associated black tarry stools which began 3 days ago. Pt reports his pain is sometimes epigastric and sometimes on his L side.  He reports nausea today with 1 small episode of emesis that he called dark brown, but is unable to specify if bloody or bilious.  Pt states he has never had a paracentesis or history of GI bleed.  He reports that he has seen a doctor at the [place] but no gastroenterologist.  He also c/o chest pain today.  He endorses peripheral edema of the bilateral lower extremities for several months. He also endorses SOB and generally ill feeling.  Nothing makes his pain better and movement and emesis makes it worse.  Pt denies fever, chills, headache, neck pain, diarrhea, weakness, dizziness, syncope, dysuria, hematuria.      Past Medical History  Diagnosis Date  . Hypertension   . Diabetes mellitus without complication   . HCV (hepatitis C virus)   . Enlarged liver   . ETOH abuse    Past Surgical History  Procedure Laterality Date  . Hernia repair    . Tonsillectomy     Family History  Problem Relation Age of Onset  . Dementia Mother   . Cancer - Other Father    History  Substance Use Topics  . Smoking status: Current Every Day Smoker -- 0.25 packs/day for 45 years    Types: Cigarettes  . Smokeless tobacco: Never Used  . Alcohol Use: Yes     Comment: 03/02/13 -"quit 2 weeks ago"    Review of Systems  Constitutional: Negative for fever, diaphoresis, appetite  change, fatigue and unexpected weight change.  HENT: Negative for mouth sores, trouble swallowing, neck pain and neck stiffness.   Respiratory: Positive for shortness of breath. Negative for cough, chest tightness, wheezing and stridor.   Cardiovascular: Positive for chest pain. Negative for palpitations.  Gastrointestinal: Positive for nausea, vomiting, abdominal pain and abdominal distention. Negative for diarrhea, constipation, blood in stool and rectal pain.  Genitourinary: Negative for dysuria, urgency, frequency, hematuria, flank pain and difficulty urinating.  Musculoskeletal: Negative for back pain.  Skin: Negative for rash.  Neurological: Negative for weakness.  Hematological: Negative for adenopathy.  Psychiatric/Behavioral: Negative for confusion.  All other systems reviewed and are negative.    Allergies  Penicillins  Home Medications   Current Outpatient Rx  Name  Route  Sig  Dispense  Refill  . aspirin EC 81 MG tablet   Oral   Take 81 mg by mouth daily.         . fish oil-omega-3 fatty acids 1000 MG capsule   Oral   Take 1 g by mouth 2 (two) times daily.         . furosemide (LASIX) 20 MG tablet   Oral   Take 20 mg by mouth.         . gabapentin (NEURONTIN) 300 MG capsule   Oral   Take 600 mg by mouth 3 (three) times daily.         Marland Kitchen  HYDROcodone-acetaminophen (NORCO/VICODIN) 5-325 MG per tablet   Oral   Take 1 tablet by mouth every 6 (six) hours as needed for pain.   15 tablet   0   . metFORMIN (GLUCOPHAGE) 1000 MG tablet   Oral   Take 1,000 mg by mouth 2 (two) times daily with a meal.         . ondansetron (ZOFRAN) 8 MG tablet   Oral   Take 4 mg by mouth every 8 (eight) hours as needed for nausea.         Marland Kitchen sulfamethoxazole-trimethoprim (SEPTRA DS) 800-160 MG per tablet   Oral   Take 1 tablet by mouth 2 (two) times daily.   20 tablet   0   . acetaminophen (TYLENOL) 500 MG tablet   Oral   Take 500 mg by mouth every 8 (eight) hours  as needed for pain.          . cephALEXin (KEFLEX) 500 MG capsule   Oral   Take 1 capsule (500 mg total) by mouth 4 (four) times daily.   40 capsule   0   . polyvinyl alcohol (LIQUIFILM TEARS) 1.4 % ophthalmic solution   Both Eyes   Place 1 drop into both eyes daily as needed (dry eyes).          . promethazine (PHENERGAN) 25 MG tablet   Oral   Take 12.5 mg by mouth every 6 (six) hours as needed for nausea.         . sildenafil (VIAGRA) 100 MG tablet   Oral   Take 50 mg by mouth daily as needed for erectile dysfunction.           BP 112/76  Pulse 131  Temp(Src) 100.4 F (38 C) (Rectal)  Resp 24  SpO2 98% Physical Exam  Nursing note and vitals reviewed. Constitutional: He appears well-developed and well-nourished. He appears distressed.  HENT:  Head: Normocephalic and atraumatic.  Mouth/Throat: Oropharynx is clear and moist.  Eyes: Conjunctivae and EOM are normal. Pupils are equal, round, and reactive to light. Scleral icterus is present.  Neck: Normal range of motion.  Cardiovascular: Regular rhythm, S1 normal, S2 normal, normal heart sounds and intact distal pulses.  Tachycardia present.   No murmur heard. Pulses:      Radial pulses are 2+ on the right side, and 2+ on the left side.       Dorsalis pedis pulses are 2+ on the right side, and 2+ on the left side.  Pulmonary/Chest: Effort normal and breath sounds normal. No accessory muscle usage. Not tachypneic. No respiratory distress. He has no decreased breath sounds. He has no wheezes. He has no rhonchi. He has no rales. He exhibits no bony tenderness and no laceration.  Abdominal: Soft. Bowel sounds are normal. He exhibits distension, fluid wave and ascites. He exhibits no mass. There is generalized tenderness (general). There is no rebound, no guarding and no CVA tenderness.  Genitourinary: Rectal exam shows no external hemorrhoid, no internal hemorrhoid, no fissure, no mass, no tenderness and anal tone normal.  Guaiac positive stool.  Musculoskeletal:  Mildly pitting edema of the LE bilaterally.    Lymphadenopathy:    He has no cervical adenopathy.  Neurological: He is alert.  Skin: Skin is warm, dry and intact. He is not diaphoretic. There is pallor.  Psychiatric: He has a normal mood and affect.    ED Course   Procedures (including critical care time)  Labs Reviewed  CBC WITH  DIFFERENTIAL - Abnormal; Notable for the following:    RBC 3.61 (*)    Hemoglobin 10.1 (*)    HCT 30.2 (*)    RDW 16.0 (*)    Monocytes Relative 14 (*)    Monocytes Absolute 1.3 (*)    All other components within normal limits  COMPREHENSIVE METABOLIC PANEL - Abnormal; Notable for the following:    Glucose, Bld 164 (*)    Total Protein 5.9 (*)    Albumin 2.0 (*)    AST 67 (*)    Total Bilirubin 1.3 (*)    GFR calc non Af Amer 80 (*)    All other components within normal limits  LIPASE, BLOOD - Abnormal; Notable for the following:    Lipase 71 (*)    All other components within normal limits  LACTIC ACID, PLASMA - Abnormal; Notable for the following:    Lactic Acid, Venous 4.7 (*)    All other components within normal limits  PROTIME-INR - Abnormal; Notable for the following:    Prothrombin Time 20.6 (*)    INR 1.83 (*)    All other components within normal limits  OCCULT BLOOD, POC DEVICE - Abnormal; Notable for the following:    Fecal Occult Bld POSITIVE (*)    All other components within normal limits  APTT  URINALYSIS, ROUTINE W REFLEX MICROSCOPIC  HEMOGLOBIN AND HEMATOCRIT, BLOOD  CBC  COMPREHENSIVE METABOLIC PANEL  POCT I-STAT TROPONIN I  TYPE AND SCREEN  ABO/RH   CRITICAL CARE Performed by: Dierdre Forth Total critical care time: Critical care time was exclusive of separately billable procedures and treating other patients. Critical care was necessary to treat or prevent imminent or life-threatening deterioration. Critical care was time spent personally by me on the  following activities: development of treatment plan with patient and/or surrogate as well as nursing, discussions with consultants, evaluation of patient's response to treatment, examination of patient, obtaining history from patient or surrogate, ordering and performing treatments and interventions, ordering and review of laboratory studies, ordering and review of radiographic studies, pulse oximetry and re-evaluation of patient's condition.   ECG:  Date: 03/02/2013  Rate: 134  Rhythm: sinus tachycardia  QRS Axis: normal  Intervals: normal  ST/T Wave abnormalities: nonspecific T wave changes  Conduction Disutrbances:none  Narrative Interpretation: inverted T waves III, aVF; changes noted from 04/22/12  Old EKG Reviewed: changes noted   Dg Chest 2 View  03/02/2013   *RADIOLOGY REPORT*  Clinical Data: Shortness of breath.  CHEST - 2 VIEW  Comparison: 08/22/2003  Findings: There is a small patchy area of infiltrate/atelectasis posteriorly at the left lung base.  Heart size and vascularity are normal.  Right lung is clear.  No significant osseous abnormality.  IMPRESSION: Small area of infiltrate/atelectasis at the left base posteriorly.   Original Report Authenticated By: Francene Boyers, M.D.   1. Coffee ground emesis   2. Hepatitis   3. Alcoholic hepatitis with ascites   4. Tachycardia   5. DM (diabetes mellitus)   6. UGI bleed     MDM  [name] presents with epigastric and abdominal pain.  Pt with hepatitis and palpable fluid wave with accumulating ascites.  Pt also reporting black tarry stools.  Pt tachycardic in the 130-140s.  Abd distended and tympanic without pain to palpation.    Record review with CT on 7/35/14 with Hepatic cirrhosis with developing diffuse abdominal and pelvic ascites; upper abdominal varices.  I personally reviewed the imaging tests through PACS system.  I reviewed available ER/hospitalization records through the EMR.    8:00 PM Pt now vomiting with  coffee ground/bloody emesis.  Pt reports this is the 2nd episode today.  Will give fluid bolus, begin octreotide and protonix.  Coags, type and screen pending.  Hgb 10.1 as compared to previous of 12.7.  Pt with significant tachycardia with witnessed coffee ground emesis.  Pt BP stable at this time, but significant concern for possible decompensation.  Will continue to give fluid and monitor closely.    9:18 PM Rectal exam with soft, black tarry stool.  Fecal Occult positive.  No further episodes of emesis.  Pt remains tachycardic.  Attempting to control pain.  Pt also found to be mildly hypoxic at 88% on RA.  Pt placed on [place] 3L with improvement to 97%  CXR with questionable infiltrate vs atelectasis.  I personally reviewed the imaging tests through PACS system.  I reviewed available ER/hospitalization records through the EMR.    10:07 PM Discussed with Dr Russella Dar of [facility] who agrees with the plan and will consult.  Pt without diagnosis of cirrhosis, but clinically appears to be cirrhotic with large ascites and general muscle wasting.  Elevated PT/IN and lactic acid of 4.7.  Pt is continuing to receive fluids.  Pt is to be admitted by the hospitalist.    Dr. Rochele Raring was consulted, evaluated this patient with me and agrees with the plan.    Dahlia Client Shilo Philipson, PA-C 03/02/13 2219  Dierdre Forth, PA-C 03/02/13 2220

## 2013-03-02 NOTE — ED Notes (Signed)
ROOM 2C08C ASSIGNED@2230 

## 2013-03-02 NOTE — ED Notes (Signed)
ROOM 1225 ASSIGNED@2157 

## 2013-03-02 NOTE — H&P (Signed)
[facility] History and Physical  Curtis Estrada ZOX:[id number] DOB: 12/26/1955 DOA: 03/02/2013  Referring physician: ED PCP: No PCP Per Patient   Chief Complaint: Hematemesis  HPI: Curtis Estrada is a 57 y.o. male who presents to the ED after quitting drinking 2 weeks ago.  Since then he has had 2 week history of abdominal swelling and distention, 2 episodes of melena over the past 2 days and 2 episodes of coffee-ground emesis 1 here in the ED observed, and 1 at home that occurred either yesterday or Sunday (he told EDP yesterday and me Sunday).  The patient also admits to a history of HCV, DM, and an "enlarged liver".  He has no history of EGD nor colonoscopy and so does not know about esophageal varices.  In the ED he was witnessed having an episode of coffee ground emesis, he was tachycardic S.Tach to the 140s which improved to 130 with fluid resuscitation.  Guiac was positive, his HGB is 10.1 down from 12 1 month ago.  Hospitalist has been asked to admit.  Review of Systems: 12 systems reviewed and otherwise negative.  Past Medical History  Diagnosis Date  . Hypertension   . Diabetes mellitus without complication   . HCV (hepatitis C virus)   . Enlarged liver   . ETOH abuse    Past Surgical History  Procedure Laterality Date  . Hernia repair    . Tonsillectomy     Social History:  reports that he has been smoking Cigarettes.  He has a 11.25 pack-year smoking history. He has never used smokeless tobacco. He reports that  drinks alcohol. He reports that he does not use illicit drugs.   Allergies  Allergen Reactions  . Penicillins     childhood    Family History  Problem Relation Age of Onset  . Dementia Mother   . Cancer - Other Father      Prior to Admission medications   Medication Sig Start Date End Date Taking? Authorizing Provider  aspirin EC 81 MG tablet Take 81 mg by mouth daily.   Yes Historical Provider, MD  fish oil-omega-3 fatty acids 1000 MG capsule  Take 1 g by mouth 2 (two) times daily.   Yes Historical Provider, MD  furosemide (LASIX) 20 MG tablet Take 20 mg by mouth.   Yes Historical Provider, MD  gabapentin (NEURONTIN) 300 MG capsule Take 600 mg by mouth 3 (three) times daily.   Yes Historical Provider, MD  HYDROcodone-acetaminophen (NORCO/VICODIN) 5-325 MG per tablet Take 1 tablet by mouth every 6 (six) hours as needed for pain. 02/05/13  Yes Derwood Kaplan, MD  metFORMIN (GLUCOPHAGE) 1000 MG tablet Take 1,000 mg by mouth 2 (two) times daily with a meal.   Yes Historical Provider, MD  ondansetron (ZOFRAN) 8 MG tablet Take 4 mg by mouth every 8 (eight) hours as needed for nausea.   Yes Historical Provider, MD  sulfamethoxazole-trimethoprim (SEPTRA DS) 800-160 MG per tablet Take 1 tablet by mouth 2 (two) times daily. 02/05/13  Yes Derwood Kaplan, MD  acetaminophen (TYLENOL) 500 MG tablet Take 500 mg by mouth every 8 (eight) hours as needed for pain.     Historical Provider, MD  cephALEXin (KEFLEX) 500 MG capsule Take 1 capsule (500 mg total) by mouth 4 (four) times daily. 02/05/13   Derwood Kaplan, MD  polyvinyl alcohol (LIQUIFILM TEARS) 1.4 % ophthalmic solution Place 1 drop into both eyes daily as needed (dry eyes).     Historical Provider, MD  promethazine (PHENERGAN)  25 MG tablet Take 12.5 mg by mouth every 6 (six) hours as needed for nausea. 08/10/12   Arman Filter, NP  sildenafil (VIAGRA) 100 MG tablet Take 50 mg by mouth daily as needed for erectile dysfunction.     Historical Provider, MD   Physical Exam: Filed Vitals:   03/02/13 2100  BP: 113/75  Pulse: 128  Temp:   Resp: 24    General:  NAD, resting comfortably in bed Eyes: PEERLA EOMI, scleral icterus noted ENT: mucous membranes moist Neck: supple w/o JVD Cardiovascular: tachycardic, regular, w/o MRG Respiratory: CTA B Abdomen: soft, nt, distended with ascites, elsewhere he is malnourished, does have stigmata of liver disease, bs+ Skin: no rash nor  lesion Musculoskeletal: MAE, full ROM all 4 extremities Psychiatric: normal tone and affect Neurologic: AAOx3, grossly non-focal  Labs on Admission:  Basic Metabolic Panel:  Recent Labs Lab 03/02/13 1845  NA 137  K 4.0  CL 104  CO2 25  GLUCOSE 164*  BUN 17  CREATININE 1.00  CALCIUM 8.4   Liver Function Tests:  Recent Labs Lab 03/02/13 1845  AST 67*  ALT 41  ALKPHOS 104  BILITOT 1.3*  PROT 5.9*  ALBUMIN 2.0*    Recent Labs Lab 03/02/13 1845  LIPASE 71*   No results found for this basename: AMMONIA,  in the last 168 hours CBC:  Recent Labs Lab 03/02/13 1845  WBC 9.6  NEUTROABS 5.4  HGB 10.1*  HCT 30.2*  MCV 83.7  PLT 197   Cardiac Enzymes: No results found for this basename: CKTOTAL, CKMB, CKMBINDEX, TROPONINI,  in the last 168 hours  BNP (last 3 results) No results found for this basename: PROBNP,  in the last 8760 hours CBG: No results found for this basename: GLUCAP,  in the last 168 hours  Radiological Exams on Admission: Dg Chest 2 View  03/02/2013   *RADIOLOGY REPORT*  Clinical Data: Shortness of breath.  CHEST - 2 VIEW  Comparison: 08/22/2003  Findings: There is a small patchy area of infiltrate/atelectasis posteriorly at the left lung base.  Heart size and vascularity are normal.  Right lung is clear.  No significant osseous abnormality.  IMPRESSION: Small area of infiltrate/atelectasis at the left base posteriorly.   Original Report Authenticated By: Francene Boyers, M.D.    EKG: Independently reviewed.  Shows Sinus tach  Assessment/Plan Principal Problem:   UGI bleed   1. UGI bleed - although not formally diagnosed, clinical findings highly concerning for an esophageal variceal bleed with exam findings of cirrhosis of liver, high risk for cirrhosis given heavy EtOH history and HCV.  Patient therefore put on PPI load and GTT as well as octreotide GTT empirically.  He is showing signs of volume depletion with S.Tach on the monitor,  aggressively fluid resuscitating him, 2 L NS in the ED, followed by at least 125 cc/hr on floor, may need additional bolus.  H/H repeat at midnight and again at [facility], transfuse PRBC PRN.  SDU for closer monitoring given his tachycardia. 2. H/o EtOH abuse - watch for withdrawal but patient states last drink 2 weeks ago, so if true withdrawal unlikely. 3. CXR finding of atelectasis vs infiltrate - will observe at this point, does not appear clinically to have PNA, treat if symptoms or signs develop.  Formal consult has been called with Dr. Russella Dar of [facility].  Code Status: Full Code (must indicate code status--if unknown or must be presumed, indicate so) Family Communication: No family in room (indicate person  spoken with, if applicable, with phone number if by telephone) Disposition Plan: Admit to SDU (indicate anticipated LOS)  Time spent: 70 min  GARDNER, JARED M. [facility] Pager [phone]  If 7PM-7AM, please contact night-coverage [email or web address] Password [facility] 03/02/2013, 9:41 PM

## 2013-03-02 NOTE — ED Notes (Signed)
MD at bedside. Evaluation of the patient. O2 sats decreased and o2 placed.

## 2013-03-02 NOTE — Progress Notes (Signed)
Patient does not use computer. Patient does not want to sign up for My Chart. Briscoe Burns BSN, RN-BC Admissions RN  03/02/2013 8:50 PM

## 2013-03-02 NOTE — ED Provider Notes (Signed)
Medical screening examination/treatment/procedure(s) were conducted as a shared visit with non-physician practitioner(s) and myself.  I personally evaluated the patient during the encounter and agree with physical exam and plan of care.  Patient is a 57 y.o. African American male with a history of hepatitis C, alcohol abuse who presents to the emergency department with 2 weeks of abdominal swelling and 2 episodes of melena, 2 episodes of coffee-ground emesis. No prior history of GI bleeding. He states he stopped drinking approximately 2 weeks ago. No history of endoscopy or colonoscopy. He is not on anticoagulation. In the ED, patient is normotensive but tachycardic in the 130s. His abdomen is distended with fluid wave but nontender. Denies a history of fever. No history of SBP. No prior history of paracentesis. Hemoglobin is 10. INR pending. Will give octreotide and start PPI drip with bolus. Will need admission.  10:23 PM  Patient is still tachycardic and hypotensive. Patient admitted to the ICU for GI bleed.  No NGT placed given concern for varices.  No h/o varices or banding.  Layla Maw Ward, DO 03/02/13 2224

## 2013-03-02 NOTE — ED Notes (Signed)
Pt is not able to urinate.  Will try again in 30 minutes. 

## 2013-03-02 NOTE — ED Notes (Signed)
Bed: WA06 Expected date:  Expected time:  Means of arrival:  Comments: ems- 57 yo M, black stool. Abdominal pain

## 2013-03-02 NOTE — ED Notes (Signed)
Patient is sitting up on the side of the bed vomiting coffee ground emesis. The patient is SOB and o2 decreased. The patient is coughing. MD awared

## 2013-03-03 ENCOUNTER — Encounter (HOSPITAL_COMMUNITY)
Admission: EM | Disposition: A | Discharge status: Skilled Nursing Facility | Payer: Self-pay | Source: Home / Self Care | Attending: Internal Medicine

## 2013-03-03 ENCOUNTER — Inpatient Hospital Stay (HOSPITAL_COMMUNITY): Payer: Non-veteran care

## 2013-03-03 ENCOUNTER — Encounter (HOSPITAL_COMMUNITY): Payer: Self-pay | Admitting: Physician Assistant

## 2013-03-03 DIAGNOSIS — J9601 Acute respiratory failure with hypoxia: Secondary | ICD-10-CM | POA: Diagnosis present

## 2013-03-03 DIAGNOSIS — IMO0002 Reserved for concepts with insufficient information to code with codable children: Secondary | ICD-10-CM

## 2013-03-03 DIAGNOSIS — K7460 Unspecified cirrhosis of liver: Secondary | ICD-10-CM

## 2013-03-03 DIAGNOSIS — R188 Other ascites: Secondary | ICD-10-CM | POA: Diagnosis present

## 2013-03-03 DIAGNOSIS — R748 Abnormal levels of other serum enzymes: Secondary | ICD-10-CM | POA: Diagnosis present

## 2013-03-03 DIAGNOSIS — R0989 Other specified symptoms and signs involving the circulatory and respiratory systems: Secondary | ICD-10-CM | POA: Clinically undetermined

## 2013-03-03 DIAGNOSIS — I10 Essential (primary) hypertension: Secondary | ICD-10-CM | POA: Diagnosis present

## 2013-03-03 DIAGNOSIS — I8510 Secondary esophageal varices without bleeding: Secondary | ICD-10-CM

## 2013-03-03 DIAGNOSIS — D62 Acute posthemorrhagic anemia: Secondary | ICD-10-CM

## 2013-03-03 DIAGNOSIS — B182 Chronic viral hepatitis C: Secondary | ICD-10-CM

## 2013-03-03 DIAGNOSIS — K7030 Alcoholic cirrhosis of liver without ascites: Secondary | ICD-10-CM | POA: Diagnosis present

## 2013-03-03 DIAGNOSIS — J9600 Acute respiratory failure, unspecified whether with hypoxia or hypercapnia: Secondary | ICD-10-CM

## 2013-03-03 DIAGNOSIS — K7010 Alcoholic hepatitis without ascites: Secondary | ICD-10-CM

## 2013-03-03 DIAGNOSIS — K766 Portal hypertension: Secondary | ICD-10-CM

## 2013-03-03 DIAGNOSIS — T148XXA Other injury of unspecified body region, initial encounter: Secondary | ICD-10-CM | POA: Diagnosis present

## 2013-03-03 DIAGNOSIS — E119 Type 2 diabetes mellitus without complications: Secondary | ICD-10-CM | POA: Diagnosis present

## 2013-03-03 DIAGNOSIS — K254 Chronic or unspecified gastric ulcer with hemorrhage: Secondary | ICD-10-CM

## 2013-03-03 DIAGNOSIS — D72829 Elevated white blood cell count, unspecified: Secondary | ICD-10-CM | POA: Diagnosis not present

## 2013-03-03 DIAGNOSIS — B1920 Unspecified viral hepatitis C without hepatic coma: Secondary | ICD-10-CM | POA: Diagnosis present

## 2013-03-03 DIAGNOSIS — D689 Coagulation defect, unspecified: Secondary | ICD-10-CM | POA: Diagnosis present

## 2013-03-03 HISTORY — DX: Acute posthemorrhagic anemia: D62

## 2013-03-03 HISTORY — DX: Reserved for concepts with insufficient information to code with codable children: IMO0002

## 2013-03-03 HISTORY — PX: ESOPHAGOGASTRODUODENOSCOPY: SHX5428

## 2013-03-03 HISTORY — DX: Chronic or unspecified gastric ulcer with hemorrhage: K25.4

## 2013-03-03 LAB — URINALYSIS, ROUTINE W REFLEX MICROSCOPIC
Glucose, UA: NEGATIVE mg/dL
Ketones, ur: 15 mg/dL — AB
Leukocytes, UA: NEGATIVE
Protein, ur: NEGATIVE mg/dL

## 2013-03-03 LAB — CBC
HCT: 22.9 % — ABNORMAL LOW (ref 39.0–52.0)
Hemoglobin: 8.8 g/dL — ABNORMAL LOW (ref 13.0–17.0)
Hemoglobin: 8 g/dL — ABNORMAL LOW (ref 13.0–17.0)
MCH: 28.9 pg (ref 26.0–34.0)
MCHC: 35.5 g/dL (ref 30.0–36.0)
MCHC: 34.9 g/dL (ref 30.0–36.0)
MCV: 82.7 fL (ref 78.0–100.0)
Platelets: 218 10*3/uL (ref 150–400)
RBC: 2.77 MIL/uL — ABNORMAL LOW (ref 4.22–5.81)
RDW: 16.3 % — ABNORMAL HIGH (ref 11.5–15.5)

## 2013-03-03 LAB — ABO/RH: ABO/RH(D): O POS

## 2013-03-03 LAB — COMPREHENSIVE METABOLIC PANEL
ALT: 41 U/L (ref 0–53)
AST: 69 U/L — ABNORMAL HIGH (ref 0–37)
Albumin: 1.9 g/dL — ABNORMAL LOW (ref 3.5–5.2)
Alkaline Phosphatase: 83 U/L (ref 39–117)
Glucose, Bld: 156 mg/dL — ABNORMAL HIGH (ref 70–99)
Potassium: 5 mEq/L (ref 3.5–5.1)
Sodium: 141 mEq/L (ref 135–145)
Total Protein: 5.3 g/dL — ABNORMAL LOW (ref 6.0–8.3)

## 2013-03-03 LAB — GLUCOSE, CAPILLARY
Glucose-Capillary: 150 mg/dL — ABNORMAL HIGH (ref 70–99)
Glucose-Capillary: 151 mg/dL — ABNORMAL HIGH (ref 70–99)
Glucose-Capillary: 156 mg/dL — ABNORMAL HIGH (ref 70–99)
Glucose-Capillary: 157 mg/dL — ABNORMAL HIGH (ref 70–99)

## 2013-03-03 LAB — TYPE AND SCREEN
ABO/RH(D): O POS
Antibody Screen: NEGATIVE

## 2013-03-03 LAB — PROTIME-INR
INR: 1.74 — ABNORMAL HIGH (ref 0.00–1.49)
Prothrombin Time: 19.8 s — ABNORMAL HIGH (ref 11.6–15.2)

## 2013-03-03 SURGERY — EGD (ESOPHAGOGASTRODUODENOSCOPY)
Anesthesia: Moderate Sedation

## 2013-03-03 MED ORDER — DIPHENHYDRAMINE HCL 50 MG/ML IJ SOLN
INTRAMUSCULAR | Status: AC
  Filled 2013-03-03: qty 1

## 2013-03-03 MED ORDER — EPINEPHRINE HCL 0.1 MG/ML IJ SOSY
PREFILLED_SYRINGE | INTRAMUSCULAR | Status: AC
  Filled 2013-03-03: qty 10

## 2013-03-03 MED ORDER — BUTAMBEN-TETRACAINE-BENZOCAINE 2-2-14 % EX AERO
INHALATION_SPRAY | CUTANEOUS | Status: DC | PRN
  Administered 2013-03-03: 1 via TOPICAL

## 2013-03-03 MED ORDER — MIDAZOLAM HCL 10 MG/2ML IJ SOLN
INTRAMUSCULAR | Status: DC | PRN
  Administered 2013-03-03 (×3): 1 mg via INTRAVENOUS
  Administered 2013-03-03: 2 mg via INTRAVENOUS
  Administered 2013-03-03: 1 mg via INTRAVENOUS

## 2013-03-03 MED ORDER — FENTANYL CITRATE 0.05 MG/ML IJ SOLN
INTRAMUSCULAR | Status: DC | PRN
  Administered 2013-03-03 (×2): 25 ug via INTRAVENOUS

## 2013-03-03 MED ORDER — NALOXONE HCL 0.4 MG/ML IJ SOLN
0.4000 mg | Freq: Once | INTRAMUSCULAR | Status: AC
  Administered 2013-03-03: 0.4 mg via INTRAVENOUS
  Filled 2013-03-03: qty 1

## 2013-03-03 MED ORDER — SODIUM CHLORIDE 0.9 % IV SOLN
1000.0000 mL | INTRAVENOUS | Status: DC
  Administered 2013-03-03: 1000 mL via INTRAVENOUS

## 2013-03-03 MED ORDER — LACTULOSE 10 GM/15ML PO SOLN
30.0000 g | Freq: Every day | ORAL | Status: DC
  Administered 2013-03-03 – 2013-03-04 (×2): 30 g via ORAL
  Filled 2013-03-03 (×2): qty 45

## 2013-03-03 MED ORDER — SODIUM CHLORIDE 0.9 % IJ SOLN
PREFILLED_SYRINGE | INTRAMUSCULAR | Status: DC | PRN
  Administered 2013-03-03: 13:00:00

## 2013-03-03 MED ORDER — ONDANSETRON HCL 4 MG/2ML IJ SOLN
4.0000 mg | Freq: Four times a day (QID) | INTRAMUSCULAR | Status: DC | PRN
  Administered 2013-03-03 (×3): 4 mg via INTRAVENOUS
  Filled 2013-03-03 (×2): qty 2

## 2013-03-03 MED ORDER — FENTANYL CITRATE 0.05 MG/ML IJ SOLN
INTRAMUSCULAR | Status: AC
  Filled 2013-03-03: qty 4

## 2013-03-03 MED ORDER — METOCLOPRAMIDE HCL 5 MG/ML IJ SOLN
10.0000 mg | Freq: Four times a day (QID) | INTRAMUSCULAR | Status: AC
  Administered 2013-03-03 – 2013-03-04 (×4): 10 mg via INTRAVENOUS
  Filled 2013-03-03 (×4): qty 2

## 2013-03-03 MED ORDER — MIDAZOLAM HCL 5 MG/ML IJ SOLN
INTRAMUSCULAR | Status: AC
  Filled 2013-03-03: qty 2

## 2013-03-03 MED ORDER — SODIUM CHLORIDE 0.9 % IV SOLN: INTRAVENOUS | Status: DC

## 2013-03-03 MED ORDER — VITAMIN K1 10 MG/ML IJ SOLN
10.0000 mg | Freq: Every day | INTRAMUSCULAR | Status: AC
  Administered 2013-03-03 – 2013-03-05 (×3): 10 mg via SUBCUTANEOUS
  Filled 2013-03-03 (×3): qty 1

## 2013-03-03 MED ORDER — DEXTROSE 5 % IV SOLN
1.0000 g | Freq: Two times a day (BID) | INTRAVENOUS | Status: AC
  Administered 2013-03-03 – 2013-03-07 (×10): 1 g via INTRAVENOUS
  Filled 2013-03-03 (×10): qty 1

## 2013-03-03 NOTE — Consult Note (Signed)
[facility] Consult: 9:59 AM 03/03/2013   Referring Provider: Dr Butler Denmark Primary Care Physician:  [facility] Gastroenterologist:  none Hardy,Mamie Sister [phone] [place], [place] [phone]  Reason for Consultation:  Hematemesis and abdominal pain.   HPI: Sheldon Sem is a 57 y.o. male.  Alcoholic with hx Hep C, alcoholic hepatitis, cirrhosis, NIDDM.  Quit ETOH 2 weeks ago Seen for toe injury on 7/25 in ED and Hgb was 12.7, compared to 16 in Jan 2014.  Transaminases with etoh abuse pattern. CT showed cirrhosis and ascites, upper abdominal varices:  Gastric and splenic, hematoma in right posterior flank.  He was empirically given abx for toe swelling.    Represented to ED [facility] initially but transferred to [facility] for available bed) with episodes of hematemesis and melena.   Hgb was 10.1.  Coags 1.8 and 20.6. Na was 131.    For 2 weeks had intermittent vomiting of coffee like material.  Not every day.  Able to keep down soup.  MD at the [facility] gave him "nausea" med (Zofran and Phenergen on PTA list) on Friday but nausea persists.  For 2 weeks having pain in lower to mid abdomen and in creasing abdominal girth.  No taking any OTC or Rx anelgesics.  Felt so bad he quit his daily 6 pack of beer.  No previous stomach trouble.  At times has pain in left chest, not exertional.  Never had EGD or colonoscopy.       Past Medical History  Diagnosis Date  . Hypertension   . Diabetes mellitus without complication   . HCV (hepatitis C virus)   . 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 ETOH abuse   . Ascites 01/2013  . Hematoma 01/2013    posterior right flank from a fall  . 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   . Anemia 02/2013  . Hyponatremia 01/2013    Past Surgical History  Procedure Laterality Date  . Hernia repair    . Tonsillectomy      Prior to Admission medications   Medication Sig Start Date  End Date Taking? Authorizing Provider  aspirin EC 81 MG tablet Take 81 mg by mouth daily.   Yes Historical Provider, MD  fish oil-omega-3 fatty acids 1000 MG capsule Take 1 g by mouth 2 (two) times daily.   Yes Historical Provider, MD  furosemide (LASIX) 20 MG tablet Take 20 mg by mouth.   Yes Historical Provider, MD  gabapentin (NEURONTIN) 300 MG capsule Take 600 mg by mouth 3 (three) times daily.   Yes Historical Provider, MD  HYDROcodone-acetaminophen (NORCO/VICODIN) 5-325 MG per tablet Take 1 tablet by mouth every 6 (six) hours as needed for pain. 02/05/13  Yes Derwood Kaplan, MD  metFORMIN (GLUCOPHAGE) 1000 MG tablet Take 1,000 mg by mouth 2 (two) times daily with a meal.   Yes Historical Provider, MD  ondansetron (ZOFRAN) 8 MG tablet Take 4 mg by mouth every 8 (eight) hours as needed for nausea.   Yes Historical Provider, MD  sulfamethoxazole-trimethoprim (SEPTRA DS) 800-160 MG per tablet Take 1 tablet by mouth 2 (two) times daily. 02/05/13  Yes Derwood Kaplan, MD  acetaminophen (TYLENOL) 500 MG tablet Take 500 mg by mouth every 8 (eight) hours as needed for pain.     Historical Provider, MD  cephALEXin (KEFLEX) 500 MG capsule Take 1 capsule (500 mg total) by mouth 4 (four) times daily. 02/05/13   Derwood Kaplan, MD  polyvinyl  alcohol (LIQUIFILM TEARS) 1.4 % ophthalmic solution Place 1 drop into both eyes daily as needed (dry eyes).     Historical Provider, MD  promethazine (PHENERGAN) 25 MG tablet Take 12.5 mg by mouth every 6 (six) hours as needed for nausea. 08/10/12   Arman Filter, NP  sildenafil (VIAGRA) 100 MG tablet Take 50 mg by mouth daily as needed for erectile dysfunction.     Historical Provider, MD    Scheduled Meds: . ceFEPime (MAXIPIME) IV  1 g Intravenous Q12H  . gabapentin  600 mg Oral TID  . insulin aspart  0-15 Units Subcutaneous Q4H  . morphine  4 mg Intravenous Once  . sodium chloride  3 mL Intravenous Q12H   Infusions: . sodium chloride 1,000 mL (03/03/13 0308)  .  octreotide (SANDOSTATIN) infusion 50 mcg/hr (03/02/13 2241)  . pantoprozole (PROTONIX) infusion 8 mg/hr (03/02/13 2240)   PRN Meds: acetaminophen, ondansetron, polyvinyl alcohol   Allergies as of 03/02/2013 - Review Complete 03/02/2013  Allergen Reaction Noted  . Penicillins  04/22/2012    Family History  Problem Relation Age of Onset  . Dementia Mother   . Cancer - Other Father       5 sisters  History   Social History  . Marital Status: Divorced    Spouse Name: N/A    Number of Children: N/A  . Years of Education: N/A   Occupational History  . Unemployed.  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 Smoking status: Current Every Day Smoker -- 0.25 packs/day for 45 years    Types: Cigarettes  . Smokeless tobacco: Never Used  . Alcohol Use: Yes     Comment: 03/02/13 -"quit 2 weeks ago"  . Drug Use: No  . Sexual Activity: Not on file    Social History Narrative  . Sister and bro in law pay his rent    REVIEW OF SYSTEMS: Constitutional:  No weight loss ENT:  No nose bleeds, no teeth Pulm:  Occasional cough and dyspnea.  No PND CV:  Previous stress test at [place], no problems reported GU:  No dysuria, no blood in urine GI:  Per HPI. Heme:  No hx of anemia, no excessive bleeding.    Transfusions:  none Neuro:  No headache.  Some dizziness with standing.  Poor sleep: too noisy in his apartment.  Tired and intermittent sleep during day.  Mild confusion.  No seizures, no hallucinations.  Derm:  No rash, sores or itching Endocrine:  No sweats or chills Immunization:  Not asked Travel:  None.    PHYSICAL EXAM: Vital signs in last 24 hours: Temp:  [97.2 F (36.2 C)-100.4 F (38 C)] 97.2 F (36.2 C) (08/21 0845) Pulse Rate:  [117-134] 119 (08/21 0845) Resp:  [18-27] 22 (08/21 0845) BP: (91-125)/(58-87) 113/73 mmHg (08/21 0845) SpO2:   92 % (08/21 0845) FiO2 (%): 2 % (08/21 0440) Weight:  86.9 kg 191 lb 9.3 oz  General: unwell, weak and  somewhat groggy but arouseable.   Head:  No facial asymmetry or swelling  Eyes:  No icterus, no pallor Ears:  Not HOH  Nose:  No discharge, no blood Mouth:  Dry oral MM.  Clear.  No teeth Neck:  No JVD, no TMG, no bruits Lungs:  Clear bil.  Unlabored breathing.  Heart: tachy, regular.  No MRG Abdomen:  Distended and tense protuberance,  Not tender.  BS hypoactive.  No mass.  Slight reducible umbilical hernia.  No  caput medusa.   Rectal: not done .  FOB + in ED  GU:  No scrotal edema Musc/Skeltl: no joint defornmity or swelling Extremities:  2 plus pedal ankle edema  Neurologic:  Groggy, arouseable, oriented x 3.  Poor recall of meds.  Moves all 4s.  Mild asterixis Skin:  No telangectasia.  No sores Tattoos:  none Nodes:  No inguinal adenopathy.    Psych:  Cooperative, depressed affect.   Intake/Output from previous day: 08/20 0701 - 08/21 0700 In: 375 [I.V.:375] Out: 550 [Urine:550] Intake/Output this shift: Total I/O In: 700 [I.V.:700] Out: -   LAB RESULTS:  Recent Labs  03/02/13 1845 03/03/13 0810  WBC 9.6 14.5*  HGB 10.1* 8.8*  HCT 30.2* 24.8*  PLT 197 218  MCV    82  BMET Lab Results  Component Value Date   NA 141 03/03/2013   NA 137 03/02/2013   NA 131* 02/04/2013   K 5.0 03/03/2013   K 4.0 03/02/2013   K 4.0 02/04/2013   CL 110 03/03/2013   CL 104 03/02/2013   CL 100 02/04/2013   CO2 17* 03/03/2013   CO2 25 03/02/2013   CO2 24 02/04/2013   GLUCOSE 156* 03/03/2013   GLUCOSE 164* 03/02/2013   GLUCOSE 148* 02/04/2013   BUN 23 03/03/2013   BUN 17 03/02/2013   BUN 6 02/04/2013   CREATININE 0.90 03/03/2013   CREATININE 1.00 03/02/2013   CREATININE 0.68 02/04/2013   CALCIUM 8.1* 03/03/2013   CALCIUM 8.4 03/02/2013   CALCIUM 8.0* 02/04/2013   LFT  Recent Labs  03/02/13 1845 03/03/13 0810  PROT 5.9* 5.3*  ALBUMIN 2.0* 1.9*  AST 67* 69*  ALT 41 41  ALKPHOS 104 83  BILITOT 1.3* 1.3*   PT/INR Lab Results  Component Value Date   INR 1.83* 03/02/2013      PTT                           20.6   RADIOLOGY STUDIES: Dg Chest 2 View  03/02/2013   *RADIOLOGY REPORT*  Clinical Data: Shortness of breath.  CHEST - 2 VIEW  Comparison: 08/22/2003  Findings: There is a small patchy area of infiltrate/atelectasis posteriorly at the left lung base.  Heart size and vascularity are normal.  Right lung is clear.  No significant osseous abnormality.  IMPRESSION: Small area of infiltrate/atelectasis at the left base posteriorly.   Original Report Authenticated By: Francene Boyers, M.D.   Ct Abdomen Pelvis W Contrast  02/05/2013  Findings: There is a linear atelectasis or fibrosis in the lung bases. Changes of hepatic cirrhosis with enlarged lateral segment left lobe and caudate lobe. Nodular contour of the liver. Gallbladder wall is thickened which may be response to ascites and cirrhosis. Spleen size is normal. Flow is demonstrated in the portal veins. There is interval development of upper abdominal fluid collections. Density measurements are consistent with ascites. Fluid extends throughout the abdomen and into the pelvis. The pancreas, kidneys, inferior vena cava, and retroperitoneal lymph nodes are unremarkable. Calcification of the abdominal aorta without aneurysm. There is a right adrenal gland nodule measuring 1.5 cm diameter. This is stable since previous study and probably represents an adenoma. Mildly prominent lymph nodes in the celiac axis likely related to reactive change due to of cirrhosis. Gastric and splenic varices. The stomach and small bowel are decompressed. Stool filled colon without abnormal distension. No free air in the abdomen. Abdominal wall musculature appears intact.  Pelvis: Pelvic ascites as previously indicated. Fat containing left inguinal hernia appears stable. Bladder wall is not thickened. Prostate gland is not enlarged. No significant pelvic lymphadenopathy. The appendix appears normal. Degenerative changes in the lumbar spine with normal alignment.  Subcutaneous soft tissue hematoma in the right flank region inferior to the right 12th rib. This measures about 4.2 x 8.6 by 3.1 cm. No displaced lumbar spine, pelvic, or rib fractures are identified. IMPRESSION: Hepatic cirrhosis with developing diffuse abdominal and pelvic ascites. Gallbladder wall thickening is probably reactive. Reactive lymph nodes in the celiac axis. The upper abdominal varices. Subcutaneous soft tissue hematoma in the right posterior flank region. No displaced fractures identified. Original Report Authenticated By: Burman Nieves, M.D.      ENDOSCOPIC STUDIES: none  IMPRESSION: *  Cg emesis.  Rule out variceal bleed, ulcers, gastritis, MW tear... *  Abdominal pain.  With the ascites, need to rule out SBP.  Had right flank hematoma on CT last month, with coagulopathy, this may have enlarged.  *   Normocytic anemia.  Hgb drop of 2 grams in  Last 4 weeks.  *  Hepatitis C + *  Cirrhosis *  Hx alcoholism.  Quit drinking around 02/17/13.  Hx Injection cocaine use.  Qui street drugs around 2008 *  Diabetes *  Hyponatremia 02/04/13:  Resolved.     PLAN: *  Transfuse FFP.  Then EGD sometime today.   *  Continue Octreotide and Protonix drips for now.  On Maxipime prophylaxis. *  Will need paracentesis, diagnostic and therapeutic.  Rule out SBP.   LOS: 1 day   Jennye Moccasin  03/03/2013, 9:59 AM Pager: [phone]

## 2013-03-03 NOTE — Progress Notes (Signed)
Utilization Review Completed.  

## 2013-03-03 NOTE — ED Notes (Signed)
Pt c/o abd pain 7/10.  Pt BP 97/62.  PA notified.  Morphine IV held for pain due to low BP.  Pt verbalizes understanding of why this medication is not given at this time.

## 2013-03-03 NOTE — Progress Notes (Signed)
[facility] Progress Note Curtis Estrada   Curtis Estrada ZOX:[id number] DOB: 23-Feb-1956 DOA: 03/02/2013 PCP: [facility]  Brief narrative: 57 year old male patient with known underlying alcoholism. Presented to the ER with complaints of abdominal swelling and melena and hematemesis. He apparently quit drinking 2 weeks prior and this correlates with the onset of the symptoms except for the GI bleeding symptoms which have only been present for 2 days. Patient denies prior history of EGD or colonoscopy and has never been told he has cirrhosis or varices per his report. While in the emergency department he had an episode of coffee-ground emesis and was tachycardic with rates in the 140s. He was given fluid resuscitation. Guaiac of this was positive. Hemoglobin one month ago was 12 and down to 10 in the ER.  Assessment/Plan: Active Problems:   Hematemesis -CT of the abdomen and pelvis in July revealed upper abdominal varices -GI consulted and plans EGD -continue Octreotide and IV PPI gtt    Acute blood loss anemia -hgb has drifted further down after hydration to 8.8 -transfuse for sx's such as hypotension or tachycardia or if hgb </= 8.0    Cirrhosis, alcoholic and Hepatitis C  /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 -pt denies being told has cirrhosis -counseled on ETOH cessation  Elevated ammonia level- asymptomatic - not disoriented or lethargic therefore does not need treatment   -no evidence of encephalopathy despite ammonia level high at 225 -GI started lactulose -repeat ammonia in am- concern with current DH/acidosis cautious use of this med (diarrhea)-     Acute respiratory failure with hypoxia - CXR reveals Edema but also very crowded lung fields due to significant ascites- may need to pursue paracentesis since resp sx's likely mechanical from ascites pushing up om diaphragm   ? Dehydration -Lactic acid was elevated at 4.7 at presentation so will contiue IVF for now (see  above) - poor urine output likely third spacing into abdomen    Coagulopathy -agree with FFP before procedures esp in light of apparent GIB    Leukocytosis/Ascites -WBC has increased since admit -still has abdominal pain and will treat empirically for SBP with Cefepime -Consider for nonemergent paracentesis    Elevated lipase -mild elevation which has trended down since admit -no evidence of acute pancreatitis    HTN (hypertension) -trend is up -was not on meds pre admit    Diabetes mellitus, type 2?? -controlled thus far - HgbA1c <6    Chronic wound of extremity-right great toe/Decreased pedal pulses -has been on anbx's previously -toe not draining at present and note diminished pulses so will check arterial duplex   DVT prophylaxis: SCDs Code Status: Full code Family Communication: Patient and sister at bedside Disposition Plan/Expected LOS: Stepdown  Isolation: None Nutritional Status:  Acute on chronic malnutrition related to ongoing alcohol abuse and underlying chronic diseases  Consultants: Gastroenterology  Procedures: EGD pending  Antibiotics: Cefepime 8/21 >>>  HPI/Subjective: Patient alert. Complains of upper abdominal pain. Denies shortness of breath. Complains of reflux of type chest discomfort this was correlated with progressive increase in size of the abdomen. Denies previous diagnosis of cirrhosis.  Objective: Blood pressure 132/90, pulse 111, temperature 97.4 F (36.3 C), temperature source Axillary, resp. rate 18, height 5\' 7"  (1.702 m), weight 86.9 kg (191 lb 9.3 oz), SpO2 100.00%.  Intake/Output Summary (Last 24 hours) at 03/03/13 1316 Last data filed at 03/03/13 1230  Gross per 24 hour  Intake 1902.5 ml  Output    550 ml  Net 1352.5 ml     Exam: General: No acute respiratory distress Lungs: Clear to auscultation bilaterally without wheezes or crackles, 3L Cardiovascular: Regular rate and rhythm without murmur gallop or rub normal S1  and S2, no peripheral edema or JVD Abdomen: Distended and mildly tender left upper quadrant without guarding or rebounding, tympanic, bowel sounds positive, no rebound, acid ascites, no appreciable mass Musculoskeletal: No significant cyanosis, clubbing of bilateral lower extremities Neurological: Alert and oriented x 3, moves all extremities x 4 without focal neurological deficits, CN 2-12 intact  Scheduled Meds:  Scheduled Meds: . ceFEPime (MAXIPIME) IV  1 g Intravenous Q12H  . gabapentin  600 mg Oral TID  . insulin aspart  0-15 Units Subcutaneous Q4H  . sodium chloride  3 mL Intravenous Q12H   Continuous Infusions: . sodium chloride    . sodium chloride    . octreotide (SANDOSTATIN) infusion 50 mcg/hr (03/02/13 2241)  . pantoprozole (PROTONIX) infusion 8 mg/hr (03/02/13 2240)    **Reviewed in detail by the Attending Physicia  Data Reviewed: Basic Metabolic Panel:  Recent Labs Lab 03/02/13 1845 03/03/13 0810  NA 137 141  K 4.0 5.0  CL 104 110  CO2 25 17*  GLUCOSE 164* 156*  BUN 17 23  CREATININE 1.00 0.90  CALCIUM 8.4 8.1*   Liver Function Tests:  Recent Labs Lab 03/02/13 1845 03/03/13 0810  AST 67* 69*  ALT 41 41  ALKPHOS 104 83  BILITOT 1.3* 1.3*  PROT 5.9* 5.3*  ALBUMIN 2.0* 1.9*    Recent Labs Lab 03/02/13 1845 03/03/13 0810  LIPASE 71* 55   No results found for this basename: AMMONIA,  in the last 168 hours CBC:  Recent Labs Lab 03/02/13 1845 03/03/13 0810  WBC 9.6 14.5*  NEUTROABS 5.4  --   HGB 10.1* 8.8*  HCT 30.2* 24.8*  MCV 83.7 82.4  PLT 197 218   Cardiac Enzymes: No results found for this basename: CKTOTAL, CKMB, CKMBINDEX, TROPONINI,  in the last 168 hours BNP (last 3 results) No results found for this basename: PROBNP,  in the last 8760 hours CBG:  Recent Labs Lab 03/03/13 0013  GLUCAP 157*    Recent Results (from the past 240 hour(s))  MRSA PCR SCREENING     Status: None   Collection Time    03/03/13  1:09 AM       Result Value Range Status   MRSA by PCR NEGATIVE  NEGATIVE Final   Comment:            The GeneXpert MRSA Assay (FDA     approved for NASAL specimens     only), is one component of a     comprehensive MRSA colonization     surveillance program. It is not     intended to diagnose MRSA     infection nor to guide or     monitor treatment for     MRSA infections.     Studies:  Recent x-ray studies have been reviewed in detail by the Attending Physician    Junious Silk, ANP [facility] Office  [phone] Pager [phone]  **If unable to reach the above provider after paging please contact the Flow Manager @ [phone]  On-Call/Text Page:      [email or web address]      password [email or web address]  If 7PM-7AM, please contact night-coverage [email or web address] Password [email or web address] 03/03/2013, 1:16 PM   LOS: 1 day   I have examined the patient, reviewed the chart and modified the above note  which I agree with.   Damarrion Mimbs,MD [phone] 03/03/2013, 3:58 PM

## 2013-03-03 NOTE — Progress Notes (Addendum)
Shift event: RN paged this NP secondary to pt being difficult to arouse. He is s/p EGD today per GI. NP to bedside. S: Pt can not participate in ROS secondary to mental status. O: Barely opens eyes to tactile stimulation/sternal rub.  VSS. His resp effort is normal and he is satting normally. A/P: 1. Acute and severe lethargy likely secondary to narcotic sedation in EGD. Give Narcan 0.4mg  now and monitor. Can repeat if necessary. Hold all sedation tonight.  Jimmye Norman, NP [facility] Addendum 0400: RN paged this NP because pt has returned to severe lethargy, although still improved over earlier. Per RN, pt woke up some after 2 doses of narcan, but now has worsened mentally and has awkward breathing. NP to bedside. Pt opens his eyes to name calling now and says "huh", but is very sleepy. Doesn't follow commands, but when arms lifted, he tries to keep them in the air. No reaction with tactile stimulation to the feet. PERRL. Breathing is not labored or tachypneic, but he puffs air from his mouth when he exhales. His O2 sats have never dropped. BP is actually climbing into normal range. Slightly tachycardic in the one teens with RRR.  In reviewing chart, his ammonia level was elevated earlier today. GI gave Lactulose with no return of stool as of yet. Note today said cautionary use of Lactulose given he is already acidotic and diarrhea could worsen this. Will recheck stat ammonia, CBC, CMP stat along with an ABG to check PCO2. Stat CT head to assess for neuro injury.  KJKG, NP, [facility] Addendum 0500: CT head neg for acute process. ABG with normal PCO2, low PO2. Awaiting lab results. Suspect high ammonia level. Likely will need Lactulose enemas.  [facility], NP   [facility] Addendum [phone]: Labs back. Ammonia down to 79. CMP looks fine except for slightly more elevated LFT. CBC looks good as well. Sugar is normal. VS are still normal. Will r/p Narcan ? Still sedated from EGD  narcotics. Will report to oncoming SDU team.  Hortencia Conradi, NP  [facility]

## 2013-03-03 NOTE — Progress Notes (Signed)
I was asked to see pt s/p transfer from [facility] to [facility] room 2C-08. Curtis Estrada is a 57 y.o. AAM who presented to the ED after quitting drinking 2 weeks ago. Since then he has had 2 wk history of abdominal swelling and distention, 2 episodes of melena over the past 2 days and 2 episodes of coffee-ground emesis, 1 in the ED observed, and 1 at home that occurred either yesterday or Sunday (he told EDP yesterday and admitting MD Sunday). The patient also admits to a history of HCV, DM, and an "enlarged liver". He has no history of EGD nor colonoscopy and so does not know about esophageal varices. Pt admitted for possible UGI bleed. Placed in SDU given tachycardia. At bedside he appears to be sleeping. He remains tachycardic in the 120's and BP remain somewhat soft at 97/62. RN reported that pt did request something for pain prior to my arrival,  but as he appears to be resting in NAD will hold off on IV morphine for now. RN to notify me w/ any pt c/o's or concerns. Will continue to monitor closely in SDU.   Leanne Chang, NP-C [facility] Pager [phone]

## 2013-03-03 NOTE — Op Note (Signed)
[facility] [address] [place], [address]   ENDOSCOPY PROCEDURE REPORT  PATIENT: Curtis, Estrada  MR#: [id number] BIRTHDATE: 1955-09-11 , 57  yrs. old GENDER: Male ENDOSCOPIST: Beverley Fiedler, MD REFERRED BY:  [facility] PROCEDURE DATE:  03/03/2013 PROCEDURE:  EGD w/ directed submucosal injection(s), any substance and EGD w/ control of bleeding ASA CLASS:     Class IV INDICATIONS:  Hematemesis.   Melena.   Acute post hemorrhagic anemia. MEDICATIONS: These medications were titrated to patient response per physician's verbal order, Fentanyl 50 mcg IV, and Versed 6 mg IV TOPICAL ANESTHETIC: Cetacaine Spray  DESCRIPTION OF PROCEDURE: After the risks benefits and alternatives of the procedure were thoroughly explained, informed consent was obtained.  The Pentax Gastroscope [id number] endoscope was introduced through the mouth and advanced to the second portion of the duodenum. Without limitations.  The instrument was slowly withdrawn as the mucosa was fully examined.     ESOPHAGUS: There were 3 columns of small varices in the distal third of the esophagus.  The varices were not actively bleeding.  There were small red spots on several columns of varices.  Banding was not performed because this was not felt to explain current UGI bleeding and possibility of repeat EGD and risk of then dislodging bands.  STOMACH: Clotted blood and heme was found in the stomach in the cardia, gastric fundus, and gastric body.  The clot was unable to be removed or moved despite copious irrigation and lavage.  The proximal stomach was incompletely visualized due to this clot.   A single round ulcer measuring 20 x 20mm in size with an adherent clot and active oozing of blood was found at the incisura. Epinephrine 1:10,000 was inject around the ulcer (4 quadrant) and into the bed at the adherent clot.  One hemostatic clip was based at the base of the adherent clot.   There was no active bleeding at the end of this procedure.  DUODENUM: The ampulla was prominent.  Otherwise, the duodenal mucosa showed no abnormalities in the bulb and second portion of the duodenum.  Retroflexed views revealed obscured by clot.   No definite gastric varices seen.  The scope was then withdrawn from the patient and the procedure completed.  COMPLICATIONS: There were no complications.  ENDOSCOPIC IMPRESSION: 1.   There were 3 columns of small varices in the distal third of the esophagus; The varices were not bleeding 2.   Blood was found in the stomach in the cardia, gastric fundus, and gastric body, incompletely examined proximal stomach 3.   Single ulcer measuring 20 x 20mm in size was found at the incisura with large adherent clot (source of recent bleeding) 4.    Prominent ampulla, otherwise the duodenal mucosa showed no abnormalities in the bulb and second portion of the duodenum  RECOMMENDATIONS: 1.  PPI infusion for 72 hours 2.  Octreotide infusion for another 24 hours 3.  Check H.  Pylori Ab and treat if positive 4.  Keep INR < 1.5 with FFP if necessary.  Vit K subcutaneous x 3 day in attempt to reverse coagulopathy 5.  ABX for SBP prophylaxis 6.  IV Reglan 10 mg every 6 hours x 4 doses to clear stomach of clot 7.  Closely monitor Hgb and transfuse if necessary 8.  No NSAIDs 9.  If not repeat EGD during this hospitalization, EGD should be repeated in 8 weeks to ensure healing of ulcers  eSigned:  Beverley Fiedler, MD  03/03/2013 1:54 PM     PATIENT NAME:  Curtis, Estrada MR#: [id number]

## 2013-03-03 NOTE — Consult Note (Signed)
Patient seen, examined, and I agree with the above documentation, including the assessment and plan. 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  Now with upper GI bleeding and acute anemia INR is 1.8, needs 2u FFP target is 1.5 or less and then EGD Already on PPI and octreotide infusions, continue for now Watch for ETOH withdrawal, AST/ALT only minimally elevated, so I do not think there is severe alc hepatitis at this time. Prophylactic abx for now x 5 days for GI bleed in cirrhosis  Agree with dx paracentesis, but would hold on LVP due to gi bleeding and risk of hepatorenal syndrome.  Send for culture, cell count, cytology, and albumin

## 2013-03-04 ENCOUNTER — Inpatient Hospital Stay (HOSPITAL_COMMUNITY): Payer: Non-veteran care

## 2013-03-04 ENCOUNTER — Encounter (HOSPITAL_COMMUNITY): Payer: Self-pay | Admitting: Radiology

## 2013-03-04 DIAGNOSIS — L97509 Non-pressure chronic ulcer of other part of unspecified foot with unspecified severity: Secondary | ICD-10-CM

## 2013-03-04 DIAGNOSIS — K7030 Alcoholic cirrhosis of liver without ascites: Secondary | ICD-10-CM

## 2013-03-04 DIAGNOSIS — K7290 Hepatic failure, unspecified without coma: Secondary | ICD-10-CM

## 2013-03-04 DIAGNOSIS — R188 Other ascites: Secondary | ICD-10-CM

## 2013-03-04 DIAGNOSIS — D689 Coagulation defect, unspecified: Secondary | ICD-10-CM

## 2013-03-04 LAB — CBC
MCH: 28.7 pg (ref 26.0–34.0)
MCHC: 34.5 g/dL (ref 30.0–36.0)
Platelets: 186 10*3/uL (ref 150–400)
RDW: 16.8 % — ABNORMAL HIGH (ref 11.5–15.5)

## 2013-03-04 LAB — GLUCOSE, CAPILLARY
Glucose-Capillary: 141 mg/dL — ABNORMAL HIGH (ref 70–99)
Glucose-Capillary: 149 mg/dL — ABNORMAL HIGH (ref 70–99)
Glucose-Capillary: 163 mg/dL — ABNORMAL HIGH (ref 70–99)
Glucose-Capillary: 167 mg/dL — ABNORMAL HIGH (ref 70–99)

## 2013-03-04 LAB — BLOOD GAS, ARTERIAL
Patient temperature: 98.6
TCO2: 21.9 mmol/L (ref 0–100)
pCO2 arterial: 30.1 mmHg — ABNORMAL LOW (ref 35.0–45.0)
pH, Arterial: 7.457 — ABNORMAL HIGH (ref 7.350–7.450)

## 2013-03-04 LAB — COMPREHENSIVE METABOLIC PANEL
ALT: 53 U/L (ref 0–53)
AST: 106 U/L — ABNORMAL HIGH (ref 0–37)
Albumin: 2.2 g/dL — ABNORMAL LOW (ref 3.5–5.2)
Alkaline Phosphatase: 72 U/L (ref 39–117)
Calcium: 8.4 mg/dL (ref 8.4–10.5)
GFR calc Af Amer: >90 mL/min (ref >90)
Potassium: 4.4 mEq/L (ref 3.5–5.1)
Sodium: 142 mEq/L (ref 135–145)
Total Protein: 5.5 g/dL — ABNORMAL LOW (ref 6.0–8.3)

## 2013-03-04 LAB — PREPARE FRESH FROZEN PLASMA: Unit division: 0

## 2013-03-04 LAB — AMMONIA: Ammonia: 79 umol/L — ABNORMAL HIGH (ref 11–60)

## 2013-03-04 MED ORDER — SODIUM CHLORIDE 0.9 % IV SOLN
1000.0000 mL | INTRAVENOUS | Status: DC
  Administered 2013-03-04: 1000 mL via INTRAVENOUS

## 2013-03-04 MED ORDER — INSULIN ASPART 100 UNIT/ML ~~LOC~~ SOLN
0.0000 [IU] | Freq: Three times a day (TID) | SUBCUTANEOUS | Status: DC
  Administered 2013-03-05 (×3): 2 [IU] via SUBCUTANEOUS

## 2013-03-04 MED ORDER — NALOXONE HCL 0.4 MG/ML IJ SOLN
0.4000 mg | Freq: Once | INTRAMUSCULAR | Status: AC
  Administered 2013-03-04: 0.4 mg via INTRAVENOUS

## 2013-03-04 MED ORDER — LACTULOSE ENEMA
300.0000 mL | Freq: Two times a day (BID) | ORAL | Status: DC
  Administered 2013-03-04: 300 mL via RECTAL
  Filled 2013-03-04 (×2): qty 300

## 2013-03-04 MED ORDER — LACTULOSE ENEMA
300.0000 mL | Freq: Two times a day (BID) | ORAL | Status: DC
  Administered 2013-03-04: 300 mL via RECTAL
  Filled 2013-03-04 (×11): qty 300

## 2013-03-04 MED ORDER — LACTULOSE 10 GM/15ML PO SOLN
30.0000 g | Freq: Two times a day (BID) | ORAL | Status: DC
  Administered 2013-03-05 – 2013-03-07 (×6): 30 g via ORAL
  Filled 2013-03-04 (×10): qty 45

## 2013-03-04 MED ORDER — NALOXONE HCL 0.4 MG/ML IJ SOLN
INTRAMUSCULAR | Status: AC
  Filled 2013-03-04: qty 1

## 2013-03-04 MED ORDER — NALOXONE HCL 0.4 MG/ML IJ SOLN: 0.4000 mg | INTRAMUSCULAR | Status: DC | PRN

## 2013-03-04 MED ORDER — NALOXONE HCL 0.4 MG/ML IJ SOLN
0.4000 mg | Freq: Once | INTRAMUSCULAR | Status: AC
  Administered 2013-03-04: 0.4 mg via INTRAVENOUS
  Filled 2013-03-04: qty 1

## 2013-03-04 NOTE — Progress Notes (Signed)
VASCULAR LAB PRELIMINARY  ARTERIAL  ABI completed:  ABIs within normal limits.  Right pedal waveforms abnormal.  Duplex imaging of the right lower extremity reveals no significant plaque or stenosis.    RIGHT    LEFT    PRESSURE WAVEFORM  PRESSURE WAVEFORM  BRACHIAL 114 Triphasic  BRACHIAL 132 Triphasic   DP 130 Monophasic  DP 130 Triphasic   AT   AT    PT 133 Monophasic  PT 142 Triphasic   PER   PER    GREAT TOE  NA GREAT TOE  NA    RIGHT LEFT  ABI 1.05 1.08     Bernadetta Roell, RVT 03/04/2013, 10:40 AM

## 2013-03-04 NOTE — Progress Notes (Signed)
[facility] Progress Note  TEAM 1 - Stepdown ICU Team   Curtis Estrada WUJ:[id number] DOB: October 07, 1955 DOA: 03/02/2013 PCP: [facility]  Brief narrative: 57 year old male patient with known underlying alcoholism. Presented to the ER with complaints of abdominal swelling, melena, and hematemesis. He apparently quit drinking 2 weeks prior and this correlates with the onset of the symptoms except for the GI bleeding symptoms which have only been present for 2 days. Patient denied prior history of EGD or colonoscopy and had never been told he has cirrhosis or varices per his report. While in the emergency department he had an episode of coffee-ground emesis and was tachycardic with rates in the 140s. He was given fluid resuscitation. Guaiac was positive. Hemoglobin one month ago was 12 and down to 10 in the ER.  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 -pt denies being told has cirrhosis -counseled on ETOH cessation -GI feels overall prognosis is poor  Encephalopathy: Hepatic vs other -on 8/21 he was not disoriented or lethargic  -ammonia level 8/21 was high at 225 -started lactulose - repeat ammonia today down to 79 but pt less alert - apparent poor sleep preadmit -awakened enough today to drink lactulose via straw but will provide enema's if needed due to AMS     Acute respiratory failure with hypoxia - CXR reveals edema but also very crowded lung fields due to significant ascites -consider paracentesis later if does not  improve -Change to Ventimask due to mouth breathing to improve saturations   ? Dehydration -Lactic acid was elevated at 4.7 at presentation so will contiue IVF for now (see above) at 50/hr -poor urine output likely third spacing into abdomen    Coagulopathy -FFP before procedures esp in light of apparent GIB    Leukocytosis/Ascites -WBC had increased since admit - subtle decrease past 24 hour -continue empiric treatment for SBP with Cefepime -Consider for nonemergent paracentesis later this hospitalization - GI agrees appropriate to delay procedure at this time    Elevated lipase -mild elevation which has trended down since admit -no evidence of acute pancreatitis    HTN (hypertension) -was not on meds pre admit    Diabetes mellitus, type 2?? -controlled thus far -was on glucophage as outpt  - HgbA1c <6    Chronic wound of extremity-right great toe/Decreased pedal pulses -has been on anbx's previously -toe not draining at present and note diminished pulses - arterial duplex with abnormal right pedal waveforms but normal ABI's (preliminary report)   DVT prophylaxis: SCDs Code Status: Full code Family Communication: Patient and sister at bedside  Disposition Plan/Expected LOS: Stepdown   Consultants: Gastroenterology  Procedures: EGD   Antibiotics: Cefepime 8/21 >>>  HPI/Subjective: Patient is more lethargic today when compared to yesterday. With stimulation was able to awaken patient enough to drink his lactulose. Minimally follows commands. Was able to determine his abdomen is still uncomfortable.  Objective: Blood pressure 125/72, pulse 107, temperature 98.7 F (37.1 C), temperature source Axillary, resp. rate 18, height 5'  7" (1.702 m), weight 86.9 kg (191 lb 9.3 oz), SpO2 100.00%.  Intake/Output Summary (Last 24 hours) at 03/04/13 1532 Last data filed at 03/04/13 1400  Gross per 24 hour  Intake   2470 ml  Output    250 ml  Net   2220 ml      Exam: General: No acute respiratory distress Lungs: Clear to auscultation bilaterally without wheezes or crackles, Ventimask 100% sat Cardiovascular: Regular rate and rhythm without murmur gallop or rub normal S1 and S2, no peripheral edema or JVD Abdomen: Distended and mildly tender left upper quadrant without guarding or rebounding, tympanic, bowel sounds positive, no rebound, acid ascites, no appreciable mass Musculoskeletal: No significant cyanosis, clubbing of bilateral lower extremities Neurological: Lethargic and weakly follows commands.  Scheduled Meds:  Scheduled Meds: . ceFEPime (MAXIPIME) IV  1 g Intravenous Q12H  . insulin aspart  0-15 Units Subcutaneous Q4H  . lactulose  30 g Oral BID   Or  . lactulose  300 mL Rectal BID  . phytonadione  10 mg Subcutaneous Daily  . sodium chloride  3 mL Intravenous Q12H   Data Reviewed: Basic Metabolic Panel:  Recent Labs Lab 03/02/13 1845 03/03/13 0810 03/04/13 0510  NA 137 141 142  K 4.0 5.0 4.4  CL 104 110 111  CO2 25 17* 21  GLUCOSE 164* 156* 153*  BUN 17 23 23   CREATININE 1.00 0.90 0.90  CALCIUM 8.4 8.1* 8.4   Liver Function Tests:  Recent Labs Lab 03/02/13 1845 03/03/13 0810 03/04/13 0510  AST 67* 69* 106*  ALT 41 41 53  ALKPHOS 104 83 72  BILITOT 1.3* 1.3* 1.2  PROT 5.9* 5.3* 5.5*  ALBUMIN 2.0* 1.9* 2.2*    Recent Labs Lab 03/02/13 1845 03/03/13 0810  LIPASE 71* 55    Recent Labs Lab 03/03/13 1445 03/04/13 0510  AMMONIA 225* 79*   CBC:  Recent Labs Lab 03/02/13 1845 03/03/13 0810 03/03/13 1859 03/04/13 0510  WBC 9.6 14.5* 16.4* 15.5*  NEUTROABS 5.4  --   --   --   HGB 10.1* 8.8* 8.0* 7.7*  HCT 30.2* 24.8* 22.9* 22.3*  MCV 83.7 82.4 82.7 83.2  PLT 197 218 212 186   CBG:  Recent Labs Lab 03/03/13 2049 03/04/13 0034 03/04/13 0451 03/04/13 0750 03/04/13 1144  GLUCAP 151* 141* 163* 149* 144*    Recent Results (from the past 240 hour(s))  MRSA PCR SCREENING     Status:  None   Collection Time    03/03/13  1:09 AM      Result Value Range Status   MRSA by PCR NEGATIVE  NEGATIVE Final   Comment:            The GeneXpert MRSA Assay (FDA     approved for NASAL specimens     only), is one component of a     comprehensive MRSA colonization     surveillance program. It is not     intended to diagnose MRSA     infection nor to guide or     monitor treatment for     MRSA infections.     Studies:  Recent x-ray studies have been reviewed in detail by the Attending Physician   Junious Silk, ANP [facility] Office  [phone] Pager [phone]  **If unable to reach the above provider after paging please contact the Flow Manager @ [phone]  On-Call/Text Page:      [email or web address]      password [email or web address]  If  7PM-7AM, please contact night-coverage [email or web address] Password [facility] 03/04/2013, 3:32 PM   LOS: 2 days   I have personally examined this patient and reviewed the entire database. I have reviewed the above note, made any necessary editorial changes, and agree with its content.  Lonia Blood, MD [facility]

## 2013-03-04 NOTE — Progress Notes (Addendum)
Pt arousable for brief periods.Capable of drinking fluids. Slept throughout shift. Oral lactalose and enema given. First black stooling started at 1700.

## 2013-03-04 NOTE — Progress Notes (Signed)
Patient seen, examined, and I agree with the above documentation, including the assessment and plan. Agree with lactulose enemas as part of his mental status changes are felt to be due to hepatic encephalopathy, alcohol withdrawal also possible.  Sedation with yesterday's EGD is not felt to currently be contributing to altered metal status as these medications should be metabolized and cleared to this point. Add lactulose by mouth when mental status allows Continue PPI drip Continue to monitor hemoglobin, transfuse if necessary End-stage liver disease with poor prognosis overall. Continue prophylactic antibiotics. Paracentesis also reasonable at some point during hospitalization. Closely monitor renal function Complete 3 days vitamin K, target INR is less than 1.5 to reduce the risk of ongoing/rebleeding

## 2013-03-04 NOTE — Progress Notes (Signed)
[facility] Daily Rounding Note 03/04/2013, 12:52 PM  SUBJECTIVE:       Problems with arousal overnight. Received Narcan. CT head negative. Note ammonia level of 225 at 1445 yesterday, down to 79 this AM.  Got 30 mg Lactulose at 1650 yesterday. No BM yet. Remains obtunded this AM.  Sister says he is not responding or recognizing her.  OBJECTIVE:         Vital signs in last 24 hours:    Temp:  [97.5 F (36.4 C)-98.7 F (37.1 C)] 98.7 F (37.1 C) (08/22 1141) Pulse Rate:  [107-120] 107 (08/22 1141) Resp:  [17-33] 18 (08/22 1141) BP: (121-142)/(70-89) 125/72 mmHg (08/22 1141) SpO2:  [94 %-100 %] 100 % (08/22 1141) Last BM Date: 03/02/13 General: barely responsive. Heart: RR, slightly tachy Chest: clear bil.  Venturi mask in place Abdomen: proteuberant, softer.  Not tender.  Hypoactive BS  Extremities: 2 to 3 plus LE edema Neuro/Psych:  Obtunded.  Only once did barely respond to me with a nod.  Not able to elicit asterixis.  No tremors at rest.   Intake/Output from previous day: 08/21 0701 - 08/22 0700 In: 2927.5 [I.V.:2475; Blood:352.5; IV Piggyback:100] Out: 300 [Urine:300]  Intake/Output this shift: Total I/O In: 900 [I.V.:850; IV Piggyback:50] Out: 250 [Urine:250]  Lab Results:  Recent Labs  03/03/13 0810 03/03/13 1859 03/04/13 0510  WBC 14.5* 16.4* 15.5*  HGB 8.8* 8.0* 7.7*  HCT 24.8* 22.9* 22.3*  PLT 218 212 186   BMET  Recent Labs  03/02/13 1845 03/03/13 0810 03/04/13 0510  NA 137 141 142  K 4.0 5.0 4.4  CL 104 110 111  CO2 25 17* 21  GLUCOSE 164* 156* 153*  BUN 17 23 23   CREATININE 1.00 0.90 0.90  CALCIUM 8.4 8.1* 8.4   LFT  Recent Labs  03/02/13 1845 03/03/13 0810 03/04/13 0510  PROT 5.9* 5.3* 5.5*  ALBUMIN 2.0* 1.9* 2.2*  AST 67* 69* 106*  ALT 41 41 53  ALKPHOS 104 83 72  BILITOT 1.3* 1.3* 1.2   PT/INR  Recent Labs  03/03/13 1015 03/04/13 0510  LABPROT 19.8* 18.5*  INR 1.74* 1.59*   Hepatitis Panel No results found for  this basename: HEPBSAG, HCVAB, HEPAIGM, HEPBIGM,  in the last 72 hours  Studies/Results:  Ct Head Wo Contrast 03/04/2013  .  IMPRESSION: No acute intracranial abnormalities.  Mild cerebral atrophy.   Original Report Authenticated By: Burman Nieves, M.D.   Dg Chest Port 1 View 03/03/2013 IMPRESSION: Cardiomegaly with pulmonary vascular congestion most notable centrally.   Original Report Authenticated By: Lacy Duverney, M.D   VASCULAR LAB PRELIMINARY  03/04/13 ARTERIAL  ABI completed: ABIs within normal limits. Right pedal waveforms abnormal. Duplex imaging of the right lower extremity reveals no significant plaque or stenosis.   ASSESMENT: *  UGI bleed from gastric ulcer EGD 03/03/13:  Ulcer at gastric incisura with large adherent clot.  This was treated with Epi injection and single endoclip.  3 columns of small varices in the distal third of the esophagus; The varices were not bleeding On octreotide and PPI drip, on Maxipime.  *  Cirrhosis, new diagnoses.  Hep C and alcoholism. Sister tells me the [place] informed his of this 2 or 3 years ago.  Instead of sobering up, "he drank more" CT last month  *  Hepatic encephalopathy.  Some asterixis yesterday, obtunded overnight.  On once daily po Lactulose.  *  Coagulopathy.  Improved with FFP x 2 yesterday and  vitamin K 1 of 3 daily doses.  *  normocytic anemia. Hgb down a little bit since yesterday. *  ABL anemia. No RBCs given to date.  *  Ascites, increasing abdominal girth and pain.  Rule out SBP.  Maxipime will provide adequate SBP coverage.  SBP could also be a source for his encephalopathy.   *  Alcoholism.  *  NIDDM  PLAN: *  Lactulose enemas vs place NGT and give tid Lactulose. I ordered enemas for now.  *  2 more days of Vit K subq *  Continue Protonix GTT for 72 hours. Stop the Octrotide (let current fresh bag complete infusing) as varices were present but not bleeding at EGD.  *  At some point perform Paracentesis (Send for culture,  cell count, cytology, and albumin) for now hold on this until mental status improves.     LOS: 2 days   Jennye Moccasin  03/04/2013, 12:52 PM Pager: [phone]

## 2013-03-05 DIAGNOSIS — A048 Other specified bacterial intestinal infections: Secondary | ICD-10-CM

## 2013-03-05 DIAGNOSIS — B9681 Helicobacter pylori [H. pylori] as the cause of diseases classified elsewhere: Secondary | ICD-10-CM

## 2013-03-05 DIAGNOSIS — B1920 Unspecified viral hepatitis C without hepatic coma: Secondary | ICD-10-CM

## 2013-03-05 DIAGNOSIS — K259 Gastric ulcer, unspecified as acute or chronic, without hemorrhage or perforation: Secondary | ICD-10-CM

## 2013-03-05 HISTORY — DX: Helicobacter pylori (H. pylori) as the cause of diseases classified elsewhere: B96.81

## 2013-03-05 LAB — CBC
HCT: 22.1 % — ABNORMAL LOW (ref 39.0–52.0)
MCHC: 34.8 g/dL (ref 30.0–36.0)
Platelets: 182 10*3/uL (ref 150–400)
RDW: 17.5 % — ABNORMAL HIGH (ref 11.5–15.5)

## 2013-03-05 LAB — PROTIME-INR: INR: 1.62 — ABNORMAL HIGH (ref 0.00–1.49)

## 2013-03-05 LAB — COMPREHENSIVE METABOLIC PANEL
AST: 76 U/L — ABNORMAL HIGH (ref 0–37)
CO2: 24 mEq/L (ref 19–32)
Calcium: 8.4 mg/dL (ref 8.4–10.5)
Creatinine, Ser: 0.96 mg/dL (ref 0.50–1.35)
GFR calc Af Amer: >90 mL/min (ref >90)
GFR calc non Af Amer: 89 mL/min — ABNORMAL LOW (ref >90)
Glucose, Bld: 163 mg/dL — ABNORMAL HIGH (ref 70–99)
Total Protein: 5.6 g/dL — ABNORMAL LOW (ref 6.0–8.3)

## 2013-03-05 LAB — GLUCOSE, CAPILLARY
Glucose-Capillary: 216 mg/dL — ABNORMAL HIGH (ref 70–99)
Glucose-Capillary: 141 mg/dL — ABNORMAL HIGH (ref 70–99)
Glucose-Capillary: 134 mg/dL — ABNORMAL HIGH (ref 70–99)
Glucose-Capillary: 115 mg/dL — ABNORMAL HIGH (ref 70–99)

## 2013-03-05 MED ORDER — OXYCODONE HCL 5 MG PO TABS
5.0000 mg | ORAL_TABLET | Freq: Four times a day (QID) | ORAL | Status: DC | PRN
  Administered 2013-03-05 – 2013-03-10 (×6): 5 mg via ORAL
  Filled 2013-03-05 (×7): qty 1

## 2013-03-05 MED ORDER — MORPHINE SULFATE 2 MG/ML IJ SOLN
1.0000 mg | INTRAMUSCULAR | Status: DC | PRN
  Administered 2013-03-05 – 2013-03-09 (×3): 2 mg via INTRAVENOUS
  Filled 2013-03-05 (×3): qty 1

## 2013-03-05 MED ORDER — RIFAXIMIN 550 MG PO TABS
550.0000 mg | ORAL_TABLET | Freq: Two times a day (BID) | ORAL | Status: DC
  Administered 2013-03-05 – 2013-03-11 (×13): 550 mg via ORAL
  Filled 2013-03-05 (×14): qty 1

## 2013-03-05 MED ORDER — PANTOPRAZOLE SODIUM 40 MG PO TBEC: 40.0000 mg | DELAYED_RELEASE_TABLET | Freq: Every day | ORAL | Status: DC

## 2013-03-05 NOTE — Progress Notes (Signed)
[facility] Progress Note   Subjective  doesn't feel too well but no specific complaints.    Objective   Vital signs in last 24 hours: Temp:  [97.7 F (36.5 C)-98.7 F (37.1 C)] 98 F (36.7 C) (08/23 0830) Pulse Rate:  [105-112] 105 (08/23 0830) Resp:  [17-26] 19 (08/23 0830) BP: (115-125)/(72-90) 119/80 mmHg (08/23 0830) SpO2:  [92 %-100 %] 97 % (08/23 0830) Last BM Date: 03/04/13 General:    Black male in NAD Heart:  Slightly tachycardic Lungs: Respirations even and unlabored Abdomen:  Soft, lnontender, largely distended c/w ascites.  Normal bowel sounds. Extremities:  BLE pitting edema.  Neurologic:  Easily arouable, slow to answer questions but oriented to place and year but unsure of month. No asterixis Psych:  Cooperative..  Lab Results:  Recent Labs  03/03/13 1859 03/04/13 0510 03/05/13 0600  WBC 16.4* 15.5* 13.8*  HGB 8.0* 7.7* 7.7*  HCT 22.9* 22.3* 22.1*  PLT 212 186 182   BMET  Recent Labs  03/03/13 0810 03/04/13 0510 03/05/13 0600  NA 141 142 145  K 5.0 4.4 3.6  CL 110 111 115*  CO2 17* 21 24  GLUCOSE 156* 153* 163*  BUN 23 23 18   CREATININE 0.90 0.90 0.96  CALCIUM 8.1* 8.4 8.4   LFT  Recent Labs  03/05/13 0600  PROT 5.6*  ALBUMIN 2.2*  AST 76*  ALT 46  ALKPHOS 68  BILITOT 1.2   PT/INR  Recent Labs  03/04/13 0510 03/05/13 0600  LABPROT 18.5* 18.8*  INR 1.59* 1.62*     Assessment / Plan:   1. Decompensated HCV and ETOH cirrhosis with encephalopathy, ascites, coagulopathy and esophageal varices (non-bleeding).   Continue IV antibiotics  Continue Lactulose. He seems capable of taking it PO now. Having BMs per RN. Start Xifaxan today.    Continue PPI drip through today then change to PO BID  Needs diagnostic (possibly therapeutic) paracentesis  soon (preferably when INR less than 1.5). No focal liver lesions on CTscan but will check AFP as one hasn't been done. Check am INR  Will need diuretics following  paracentesis.  Will make sure diet is low sodium    2. Upper GI bleed secondary to gastric ulcer, s/p EGD with control of bleeding 03/03/13. Bleeding resolved. Continue PPI drip through today then can change to PO BID.      LOS: 3 days   Willette Cluster  03/05/2013, 10:29 AM

## 2013-03-05 NOTE — Progress Notes (Signed)
[facility] Progress Note [place] TEAM 1 - Stepdown ICU Team   Curtis Estrada ZOX:[id number] DOB: 1956-02-07 DOA: 03/02/2013 PCP: [facility]  Brief narrative: 57 year old male patient with known underlying alcoholism. Presented to the ER with complaints of abdominal swelling, melena, and hematemesis. He apparently quit drinking 2 weeks prior and this correlates with the onset of the symptoms except for the GI bleeding symptoms which had only been present for 2 days. Patient denied prior history of EGD or colonoscopy and had never been told he has cirrhosis or varices per his report. While in the emergency department he had an episode of coffee-ground emesis and was tachycardic with rates in the 140s. He was given fluid resuscitation. Guaiac was positive. Hemoglobin one month ago was 12;  Hgb was down to 10 in the ER.  Assessment/Plan:  Encephalopathy - Hepatic +/- EtOH withdrawal  lactulose dosing continues - MS has improved as of 8/23 - cont current intervention   Hematemesis / UGIB due to ulcer CT of the abdomen and pelvis in July revealed upper abdominal varices - GI consulted - EGD: non bleeding varices, 2 x2 cm ulcer at incisura - complete IV PPI gtt for total of 72 hours  Acute blood loss anemia hgb drifted down after hydration to 7.7, but is currently holding steady - transfuse for sx's such as hypotension or tachycardia or if hgb </= 7.0 (no known hx of CAD)  Cirrhosis, alcoholic and Hepatitis C - 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 pt denies being told has cirrhosis - counseled on ETOH cessation - overall prognosis is poor  Acute respiratory failure with hypoxia CXR revealed edema but also very crowded lung fields due to significant ascites - consider paracentesis when more stable clinically   Dehydration Lactic acid was elevated at 4.7 at presentation - contiue IVF for now (see above) at 50/hr -poor urine output likely third spacing into abdomen  Coagulopathy No signif response to  Vitamin K at this time   Ascites continue empiric treatment for SBP with Cefepime - consider nonemergent paracentesis later this hospitalization  Elevated lipase mild elevation which has trended down since admit - recheck in AM  Diabetes mellitus, type 2?? was on glucophage as outpt - HgbA1c <6  Chronic wound of extremity-right great toe/Decreased pedal pulses has been on anbx's previously - toe not draining at present - note diminished pulses - arterial duplex with abnormal right pedal waveforms but normal ABI's (preliminary report)   DVT prophylaxis: SCDs Code Status: Full code Family Communication: no family present at time of exam today  Disposition Plan/Expected LOS: Stepdown   Consultants: Gastroenterology  Procedures: EGD   Antibiotics: Cefepime 8/21 >>>  HPI/Subjective: More alert today, though remains confused.  C/O hurting "all over."  Hx is not felt to be reliable due to confusion.   Objective: Blood pressure 119/80, pulse 105, temperature 98 F (36.7 C), temperature source Axillary, resp. rate 19, height 5\' 7"  (1.702 m), weight 86.9 kg (191 lb 9.3 oz), SpO2 97.00%.  Intake/Output Summary (Last 24 hours) at 03/05/13 0944 Last data filed at 03/05/13 0600  Gross per 24 hour  Intake   1955 ml  Output    600 ml  Net   1355 ml    Exam: General: No acute respiratory distress Lungs: Clear to auscultation bilaterally without wheezes or crackles Cardiovascular: Regular rate and rhythm without murmur gallop or rub normal S1 and S2, no peripheral edema or JVD Abdomen: Distended w/ ascites, without guarding or rebound, tympanic, bowel sounds positive,  no appreciable mass Musculoskeletal: No significant cyanosis, clubbing of bilateral lower extremities Neurological: Lethargic but improved today - moves all 4 ext spontaneously   Scheduled Meds:  Scheduled Meds: . ceFEPime (MAXIPIME) IV  1 g Intravenous Q12H  . insulin aspart  0-15 Units Subcutaneous TID WC  .  lactulose  30 g Oral BID   Or  . lactulose  300 mL Rectal BID  . sodium chloride  3 mL Intravenous Q12H   Data Reviewed: Basic Metabolic Panel:  Recent Labs Lab 03/02/13 1845 03/03/13 0810 03/04/13 0510 03/05/13 0600  NA 137 141 142 145  K 4.0 5.0 4.4 3.6  CL 104 110 111 115*  CO2 25 17* 21 24  GLUCOSE 164* 156* 153* 163*  BUN 17 23 23 18   CREATININE 1.00 0.90 0.90 0.96  CALCIUM 8.4 8.1* 8.4 8.4   Liver Function Tests:  Recent Labs Lab 03/02/13 1845 03/03/13 0810 03/04/13 0510 03/05/13 0600  AST 67* 69* 106* 76*  ALT 41 41 53 46  ALKPHOS 104 83 72 68  BILITOT 1.3* 1.3* 1.2 1.2  PROT 5.9* 5.3* 5.5* 5.6*  ALBUMIN 2.0* 1.9* 2.2* 2.2*    Recent Labs Lab 03/02/13 1845 03/03/13 0810  LIPASE 71* 55    Recent Labs Lab 03/03/13 1445 03/04/13 0510  AMMONIA 225* 79*   CBC:  Recent Labs Lab 03/02/13 1845 03/03/13 0810 03/03/13 1859 03/04/13 0510 03/05/13 0600  WBC 9.6 14.5* 16.4* 15.5* 13.8*  NEUTROABS 5.4  --   --   --   --   HGB 10.1* 8.8* 8.0* 7.7* 7.7*  HCT 30.2* 24.8* 22.9* 22.3* 22.1*  MCV 83.7 82.4 82.7 83.2 84.0  PLT 197 218 212 186 182   CBG:  Recent Labs Lab 03/04/13 0451 03/04/13 0750 03/04/13 1144 03/04/13 1618 03/05/13 0821  GLUCAP 163* 149* 144* 167* 142*    Recent Results (from the past 240 hour(s))  MRSA PCR SCREENING     Status: None   Collection Time    03/03/13  1:09 AM      Result Value Range Status   MRSA by PCR NEGATIVE  NEGATIVE Final   Comment:            The GeneXpert MRSA Assay (FDA     approved for NASAL specimens     only), is one component of a     comprehensive MRSA colonization     surveillance program. It is not     intended to diagnose MRSA     infection nor to guide or     monitor treatment for     MRSA infections.     Studies:  Recent x-ray studies have been reviewed in detail by the Attending Physician   Lonia Blood, MD [facility] Office  [phone] Pager  [phone]  On-Call/Text Page:      [email or web address]      password [facility]  03/05/2013, 9:44 AM   LOS: 3 days

## 2013-03-05 NOTE — Progress Notes (Signed)
Patient seen, examined, and I agree with the above documentation, including the assessment and plan. Complicated medical issues, early in the week acute upper GI bleeding due to large gastric ulcer with visible vessel was biggest issue. Ulcer was injected with epinephrine and clipped, and at this point there has been no evidence of overt rebleeding Biggest issue at present is decompensated hepatitis C and alcoholic cirrhosis complicated by portal hypertension as manifested by hepatic encephalopathy, large volume ascites and coagulopathy Agree with changing lactulose to oral and adding rifaximin 550 twice a day Twice a day PPI starting tomorrow Large-volume paracentesis, possibly tomorrow. Patient will need IV albumin with paracentesis, send for culture, Gram stain, cytology and cell count (expected to be sterile at this point as he has been on prophylactic antibiotics) Low sodium diet No NSAIDs Alcohol cessation which I discussed with patient today. He states "already quit 2 weeks ago".  Hopefully he will be able to maintain sobriety, overall prognosis is poor, worsening if he resumes alcohol intake H. pylori antibody positive, would treat for H. pylori infection. Can start H. pylori antibiotics after he completes 5 days of IV antibiotics for SBP prophylaxis in the setting of GI bleeding and cirrhosis (amoxicillin 1 g twice daily, clindamycin 500 twice daily x7 days with BID PPI)

## 2013-03-06 ENCOUNTER — Inpatient Hospital Stay (HOSPITAL_COMMUNITY): Payer: Non-veteran care

## 2013-03-06 LAB — BASIC METABOLIC PANEL
Chloride: 114 mEq/L — ABNORMAL HIGH (ref 96–112)
GFR calc Af Amer: >90 mL/min (ref >90)
GFR calc non Af Amer: 80 mL/min — ABNORMAL LOW (ref >90)
Potassium: 3.7 mEq/L (ref 3.5–5.1)
Sodium: 143 mEq/L (ref 135–145)

## 2013-03-06 LAB — BODY FLUID CELL COUNT WITH DIFFERENTIAL
Monocyte-Macrophage-Serous Fluid: 23 % — ABNORMAL LOW (ref 50–90)
Neutrophil Count, Fluid: 6 % (ref 0–25)
Total Nucleated Cell Count, Fluid: 286 cu mm (ref 0–1000)

## 2013-03-06 LAB — LIPASE, BLOOD: Lipase: 1088 U/L — ABNORMAL HIGH (ref 11–59)

## 2013-03-06 LAB — PROTIME-INR: Prothrombin Time: 18.1 seconds — ABNORMAL HIGH (ref 11.6–15.2)

## 2013-03-06 LAB — GLUCOSE, CAPILLARY
Glucose-Capillary: 163 mg/dL — ABNORMAL HIGH (ref 70–99)
Glucose-Capillary: 157 mg/dL — ABNORMAL HIGH (ref 70–99)
Glucose-Capillary: 205 mg/dL — ABNORMAL HIGH (ref 70–99)

## 2013-03-06 LAB — APTT: aPTT: 34 seconds (ref 24–37)

## 2013-03-06 LAB — CBC
HCT: 24.7 % — ABNORMAL LOW (ref 39.0–52.0)
Hemoglobin: 8.2 g/dL — ABNORMAL LOW (ref 13.0–17.0)
WBC: 11.5 10*3/uL — ABNORMAL HIGH (ref 4.0–10.5)

## 2013-03-06 MED ORDER — ENSURE COMPLETE PO LIQD
237.0000 mL | Freq: Two times a day (BID) | ORAL | Status: DC
  Administered 2013-03-06 – 2013-03-11 (×8): 237 mL via ORAL

## 2013-03-06 MED ORDER — ENSURE PUDDING PO PUDG
1.0000 | Freq: Three times a day (TID) | ORAL | Status: DC
  Administered 2013-03-06 – 2013-03-11 (×11): 1 via ORAL

## 2013-03-06 MED ORDER — PANTOPRAZOLE SODIUM 40 MG PO TBEC
40.0000 mg | DELAYED_RELEASE_TABLET | Freq: Two times a day (BID) | ORAL | Status: DC
  Administered 2013-03-06 – 2013-03-11 (×9): 40 mg via ORAL
  Filled 2013-03-06 (×10): qty 1

## 2013-03-06 MED ORDER — SODIUM CHLORIDE 0.9 % IV SOLN: 1000.0000 mL | INTRAVENOUS | Status: DC

## 2013-03-06 MED ORDER — ALBUMIN HUMAN 25 % IV SOLN
75.0000 g | Freq: Once | INTRAVENOUS | Status: AC
  Administered 2013-03-06: 75 g via INTRAVENOUS
  Filled 2013-03-06: qty 300

## 2013-03-06 NOTE — Progress Notes (Signed)
[facility] Progress Note   Subjective  Feels a bit better today.  Denies abd pain, but reports abd feels "tight" No nausea or vomiting. No stools overnight or today yet, did have several non-melenic BMs yesterday   Objective  Vital signs in last 24 hours: Temp:  [98.3 F (36.8 C)-98.9 F (37.2 C)] 98.9 F (37.2 C) (08/24 0325) Pulse Rate:  [106-115] 107 (08/24 0900) Resp:  [13-24] 14 (08/24 0900) BP: (95-126)/(70-83) 106/73 mmHg (08/24 0800) SpO2:  [91 %-97 %] 94 % (08/24 0900) Weight:  [199 lb 4.7 oz (90.4 kg)] 199 lb 4.7 oz (90.4 kg) (08/24 0325) Last BM Date: 03/05/13 Gen: awake, chronically ill-appearing male in no acute distress  HEENT: anicteric, op clear CV: tachy, regular with ectopy Pulm: decreased b/l bases Abd: tense with ascites, nontender, +BS Ext: no c/c, 2+ LE edema to mid-thigh Neuro: nonfocal, knows name, month, and place  Lab Results:  Recent Labs  03/04/13 0510 03/05/13 0600 03/06/13 0500  WBC 15.5* 13.8* 11.5*  HGB 7.7* 7.7* 8.2*  HCT 22.3* 22.1* 24.7*  PLT 186 182 205   BMET  Recent Labs  03/04/13 0510 03/05/13 0600 03/06/13 0500  NA 142 145 143  K 4.4 3.6 3.7  CL 111 115* 114*  CO2 21 24 25   GLUCOSE 153* 163* 133*  BUN 23 18 15   CREATININE 0.90 0.96 1.00  CALCIUM 8.4 8.4 8.5   LFT  Recent Labs  03/05/13 0600  PROT 5.6*  ALBUMIN 2.2*  AST 76*  ALT 46  ALKPHOS 68  BILITOT 1.2   PT/INR  Recent Labs  03/05/13 0600 03/06/13 0500  LABPROT 18.8* 18.1*  INR 1.62* 1.54*     Assessment & Plan  1.  decompensated HCV and alcoholic cirrhosis with encephalopathy, ascites, coagulopathy, and nonbleeding esophageal varices --Cefepime though tomorrow, then discontinue (SBP prophylaxis in the setting of GI bleeding) --Continue lactulose and Xifaxan --Twice a day PPI --Okay for large-volume paracentesis with IV albumin; initiation of diuretics in the coming days --Low sodium diet --Alcohol cessation paramount  2.   Gastric ulcer, H. pylori antibody positive.  No further bleeding, improved hemoglobin today.  INR slightly improved with vitamin K --Twice a day PPI for at least 12 weeks --H. pylori therapy on discharge   Active Problems:   Hematemesis   Cirrhosis, alcoholic/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   Hepatitis C   Acute respiratory failure with hypoxia   Leukocytosis   Elevated lipase   Acute blood loss anemia   Coagulopathy   HTN (hypertension)   Diabetes mellitus, type 2   Chronic wound of extremity-right great toe   Decreased pedal pulses   Ascites   Gastric ulcer with hemorrhage   H pylori ulcer     LOS: 4 days   Curtis Estrada M  03/06/2013, 9:19 AM

## 2013-03-06 NOTE — Progress Notes (Signed)
Pt's reports having blurry vision.  He said it was the same yesterday.  He wears reading glasses and the family will bring them in tomorrow so he can see if that helps his vision.

## 2013-03-06 NOTE — Progress Notes (Signed)
[facility] Progress Note [place] TEAM 1 - Stepdown ICU Team   Curtis Estrada ZOX:[id number] DOB: Apr 21, 1956 DOA: 03/02/2013 PCP: [facility]  Brief narrative: 57 year old male patient with known underlying alcoholism. Presented to the ER with complaints of abdominal swelling, melena, and hematemesis. He apparently quit drinking 2 weeks prior and this correlates with the onset of the symptoms except for the GI bleeding symptoms which had only been present for 2 days. Patient denied prior history of EGD or colonoscopy and had never been told he has cirrhosis or varices per his report. While in the emergency department he had an episode of coffee-ground emesis and was tachycardic with rates in the 140s. He was given fluid resuscitation. Guaiac was positive. Hemoglobin one month ago was 12;  Hgb was down to 10 in the ER.  Assessment/Plan:  Encephalopathy - Hepatic +/- EtOH withdrawal  lactulose dosing continues - MS continues to improve - cont lactulose + rifaximin   Hematemesis / UGIB due to ulcer CT of the abdomen and pelvis in July revealed upper abdominal varices - GI consulted - EGD: non bleeding varices, 2 x2 cm ulcer at incisura - completed IV PPI gtt for total of 72 hours >> BID PPI   H pylori + Initiate tx regimen at time of d/c   Acute blood loss anemia hgb drifted down after hydration to 7.7 - transfuse for sx's such as hypotension or tachycardia or if hgb </= 7.0 (no known hx of CAD)  Cirrhosis, alcoholic and Hepatitis C - 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 with large volume ascites  pt denied being told has cirrhosis - counseled on ETOH cessation - overall prognosis is poor - underwent large volume paracentesis today - tolerating well thus far - GI ordered albumin   Acute respiratory failure with hypoxia CXR revealed edema but also very crowded lung fields due to significant ascites - much improved post paracentesis   Coagulopathy No signif response to Vitamin K at this time    Elevated lipase mild elevation which has trended down since admit - signif elevated today, but pt has no sx to suggest pancreatitis - follow clinically   Diabetes mellitus, type 2?? was on glucophage as outpt - HgbA1c <6 - CBG has been well controlled   Chronic wound of extremity-right great toe/Decreased pedal pulses has been on anbx's previously - toe not draining at present - note diminished pulses - arterial duplex with abnormal right pedal waveforms but normal ABI's (preliminary report)   DVT prophylaxis: SCDs Code Status: FULL Family Communication: no family present at time of exam today  Disposition Plan/Expected LOS: Stepdown   Consultants: Gastroenterology  Procedures: EGD Paracentesis 8/24   Antibiotics: Cefepime 8/21 >>>8/25  HPI/Subjective: Alert and conversant.  Is hungry.  Reports sob is much improved post paracentesis.  Denies abdom pain.  Is asking for ensure.  Objective: Blood pressure 108/74, pulse 117, temperature 98.9 F (37.2 C), temperature source Oral, resp. rate 21, height 5\' 7"  (1.702 m), weight 90.4 kg (199 lb 4.7 oz), SpO2 93.00%.  Intake/Output Summary (Last 24 hours) at 03/06/13 1048 Last data filed at 03/06/13 1000  Gross per 24 hour  Intake   2738 ml  Output     75 ml  Net   2663 ml    Exam: General: No acute respiratory distress Lungs: Clear to auscultation bilaterally without wheezes or crackles Cardiovascular: Regular rate and rhythm without murmur gallop or rub - no peripheral edema or JVD Abdomen: much less distended w/ ascites, without guarding  or rebound, tympanic, bowel sounds positive, no appreciable mass Musculoskeletal: No significant cyanosis, clubbing of bilateral lower extremities Neurological: much more alert - follows commands - no focal neuro deficits  Scheduled Meds:  Scheduled Meds: . albumin human  75 g Intravenous Once  . ceFEPime (MAXIPIME) IV  1 g Intravenous Q12H  . insulin aspart  0-15 Units Subcutaneous  TID WC  . lactulose  30 g Oral BID   Or  . lactulose  300 mL Rectal BID  . pantoprazole  40 mg Oral BID AC  . rifaximin  550 mg Oral BID  . sodium chloride  3 mL Intravenous Q12H   Data Reviewed: Basic Metabolic Panel:  Recent Labs Lab 03/02/13 1845 03/03/13 0810 03/04/13 0510 03/05/13 0600 03/06/13 0500  NA 137 141 142 145 143  K 4.0 5.0 4.4 3.6 3.7  CL 104 110 111 115* 114*  CO2 25 17* 21 24 25   GLUCOSE 164* 156* 153* 163* 133*  BUN 17 23 23 18 15   CREATININE 1.00 0.90 0.90 0.96 1.00  CALCIUM 8.4 8.1* 8.4 8.4 8.5   Liver Function Tests:  Recent Labs Lab 03/02/13 1845 03/03/13 0810 03/04/13 0510 03/05/13 0600  AST 67* 69* 106* 76*  ALT 41 41 53 46  ALKPHOS 104 83 72 68  BILITOT 1.3* 1.3* 1.2 1.2  PROT 5.9* 5.3* 5.5* 5.6*  ALBUMIN 2.0* 1.9* 2.2* 2.2*    Recent Labs Lab 03/02/13 1845 03/03/13 0810 03/06/13 0500  LIPASE 71* 55 1088*    Recent Labs Lab 03/03/13 1445 03/04/13 0510  AMMONIA 225* 79*   CBC:  Recent Labs Lab 03/02/13 1845 03/03/13 0810 03/03/13 1859 03/04/13 0510 03/05/13 0600 03/06/13 0500  WBC 9.6 14.5* 16.4* 15.5* 13.8* 11.5*  NEUTROABS 5.4  --   --   --   --   --   HGB 10.1* 8.8* 8.0* 7.7* 7.7* 8.2*  HCT 30.2* 24.8* 22.9* 22.3* 22.1* 24.7*  MCV 83.7 82.4 82.7 83.2 84.0 85.2  PLT 197 218 212 186 182 205   CBG:  Recent Labs Lab 03/05/13 0821 03/05/13 1223 03/05/13 1658 03/05/13 2142 03/06/13 0757  GLUCAP 142* 141* 134* 115* 116*    Recent Results (from the past 240 hour(s))  MRSA PCR SCREENING     Status: None   Collection Time    03/03/13  1:09 AM      Result Value Range Status   MRSA by PCR NEGATIVE  NEGATIVE Final   Comment:            The GeneXpert MRSA Assay (FDA     approved for NASAL specimens     only), is one component of a     comprehensive MRSA colonization     surveillance program. It is not     intended to diagnose MRSA     infection nor to guide or     monitor treatment for     MRSA  infections.     Studies:  Recent x-ray studies have been reviewed in detail by the Attending Physician   Lonia Blood, MD [facility] Office  [phone] Pager [phone]  On-Call/Text Page:      [email or web address]      password [facility]  03/06/2013, 10:48 AM   LOS: 4 days

## 2013-03-06 NOTE — Procedures (Signed)
Successful US guided paracentesis from LLQ.  Yielded 5.3 liters of amber colored fluid.  No immediate complications.  Pt tolerated well.   Specimen was sent for labs.  Pattricia Boss D PA-C 03/06/2013 10:36 AM

## 2013-03-07 LAB — GLUCOSE, CAPILLARY
Glucose-Capillary: 239 mg/dL — ABNORMAL HIGH (ref 70–99)
Glucose-Capillary: 191 mg/dL — ABNORMAL HIGH (ref 70–99)

## 2013-03-07 LAB — COMPREHENSIVE METABOLIC PANEL
ALT: 34 U/L (ref 0–53)
AST: 65 U/L — ABNORMAL HIGH (ref 0–37)
Alkaline Phosphatase: 102 U/L (ref 39–117)
CO2: 20 mEq/L (ref 19–32)
Calcium: 8.1 mg/dL — ABNORMAL LOW (ref 8.4–10.5)
Chloride: 106 mEq/L (ref 96–112)
GFR calc Af Amer: >90 mL/min (ref >90)
GFR calc non Af Amer: >90 mL/min (ref >90)
Glucose, Bld: 246 mg/dL — ABNORMAL HIGH (ref 70–99)
Potassium: 3.3 mEq/L — ABNORMAL LOW (ref 3.5–5.1)
Sodium: 135 mEq/L (ref 135–145)
Total Bilirubin: 1.4 mg/dL — ABNORMAL HIGH (ref 0.3–1.2)

## 2013-03-07 LAB — CBC
HCT: 21.4 % — ABNORMAL LOW (ref 39.0–52.0)
MCH: 28.5 pg (ref 26.0–34.0)
MCH: 28.3 pg (ref 26.0–34.0)
MCHC: 34.5 g/dL (ref 30.0–36.0)
MCV: 80.8 fL (ref 78.0–100.0)
Platelets: 176 10*3/uL (ref 150–400)
Platelets: 190 10*3/uL (ref 150–400)
RDW: 17 % — ABNORMAL HIGH (ref 11.5–15.5)
RDW: 16.3 % — ABNORMAL HIGH (ref 11.5–15.5)
WBC: 12 10*3/uL — ABNORMAL HIGH (ref 4.0–10.5)

## 2013-03-07 MED ORDER — INSULIN ASPART 100 UNIT/ML ~~LOC~~ SOLN
0.0000 [IU] | Freq: Every day | SUBCUTANEOUS | Status: DC
  Administered 2013-03-09: 2 [IU] via SUBCUTANEOUS
  Administered 2013-03-10: 3 [IU] via SUBCUTANEOUS

## 2013-03-07 MED ORDER — POTASSIUM CHLORIDE CRYS ER 20 MEQ PO TBCR
40.0000 meq | EXTENDED_RELEASE_TABLET | Freq: Once | ORAL | Status: AC
  Administered 2013-03-07: 40 meq via ORAL
  Filled 2013-03-07: qty 2

## 2013-03-07 MED ORDER — SPIRONOLACTONE 100 MG PO TABS
100.0000 mg | ORAL_TABLET | Freq: Every day | ORAL | Status: DC
  Administered 2013-03-07: 100 mg via ORAL
  Filled 2013-03-07 (×2): qty 1

## 2013-03-07 MED ORDER — PROPRANOLOL HCL 10 MG PO TABS
10.0000 mg | ORAL_TABLET | Freq: Three times a day (TID) | ORAL | Status: DC
  Administered 2013-03-07 – 2013-03-11 (×10): 10 mg via ORAL
  Filled 2013-03-07 (×17): qty 1

## 2013-03-07 MED ORDER — INSULIN DETEMIR 100 UNIT/ML ~~LOC~~ SOLN
10.0000 [IU] | Freq: Every day | SUBCUTANEOUS | Status: DC
  Administered 2013-03-07 – 2013-03-10 (×4): 10 [IU] via SUBCUTANEOUS
  Filled 2013-03-07 (×5): qty 0.1

## 2013-03-07 MED ORDER — INSULIN ASPART 100 UNIT/ML ~~LOC~~ SOLN
0.0000 [IU] | Freq: Three times a day (TID) | SUBCUTANEOUS | Status: DC
  Administered 2013-03-07 (×2): 3 [IU] via SUBCUTANEOUS
  Administered 2013-03-07: 5 [IU] via SUBCUTANEOUS
  Administered 2013-03-08: 1 [IU] via SUBCUTANEOUS
  Administered 2013-03-08 (×2): 3 [IU] via SUBCUTANEOUS
  Administered 2013-03-09 (×2): 2 [IU] via SUBCUTANEOUS
  Administered 2013-03-09: 5 [IU] via SUBCUTANEOUS
  Administered 2013-03-10 (×2): 2 [IU] via SUBCUTANEOUS
  Administered 2013-03-10: 5 [IU] via SUBCUTANEOUS
  Administered 2013-03-11: 1 [IU] via SUBCUTANEOUS

## 2013-03-07 MED ORDER — FUROSEMIDE 40 MG PO TABS
40.0000 mg | ORAL_TABLET | Freq: Every day | ORAL | Status: DC
  Administered 2013-03-07: 40 mg via ORAL
  Filled 2013-03-07 (×2): qty 1

## 2013-03-07 NOTE — Progress Notes (Signed)
[facility] Progress Note   Subjective  feels okay   Objective   Vital signs in last 24 hours: Temp:  [98.3 F (36.8 C)-99.7 F (37.6 C)] 99.7 F (37.6 C) (08/25 0754) Pulse Rate:  [100-121] 121 (08/25 0754) Resp:  [14-32] 32 (08/25 0754) BP: (97-123)/(61-92) 106/66 mmHg (08/25 0754) SpO2:  [93 %-99 %] 97 % (08/25 0754) Weight:  [199 lb 11.8 oz (90.6 kg)] 199 lb 11.8 oz (90.6 kg) (08/25 0450) Last BM Date: 03/05/13 General:    Black male in NAD Heart:  Tachycardic in 120's Abdomen:  Soft, nontender, moderately distended with normal bowel sounds. Extremities:  Pitting BLE edema.  Neurologic:  Alert and oriented today,  grossly normal neurologically. No asterixis Psych:  Cooperative.   Lab Results:  Recent Labs  03/05/13 0600 03/06/13 0500 03/07/13 0700  WBC 13.8* 11.5* 10.9*  HGB 7.7* 8.2* 7.1*  HCT 22.1* 24.7* 20.6*  PLT 182 205 176   BMET  Recent Labs  03/05/13 0600 03/06/13 0500 03/07/13 0700  NA 145 143 135  K 3.6 3.7 3.3*  CL 115* 114* 106  CO2 24 25 20   GLUCOSE 163* 133* 246*  BUN 18 15 12   CREATININE 0.96 1.00 0.79  CALCIUM 8.4 8.5 8.1*   LFT  Recent Labs  03/07/13 0700  PROT 5.0*  ALBUMIN 2.5*  AST 65*  ALT 34  ALKPHOS 102  BILITOT 1.4*   PT/INR  Recent Labs  03/05/13 0600 03/06/13 0500  LABPROT 18.8* 18.1*  INR 1.62* 1.54*    Studies/Results: US Paracentesis  03/06/2013   *RADIOLOGY REPORT*  Clinical Data: ascites, cirrhosis  ULTRASOUND GUIDED PARACENTESIS  An ultrasound guided paracentesis was thoroughly discussed with the patient and questions answered.  The benefits, risks, alternatives and complications were also discussed.  The patient understands and wishes to proceed with the procedure.  Written consent was obtained.  Ultrasound was performed to localize and mark an adequate pocket of fluid in the left lower quadrant of the abdomen.  The area was then prepped and draped in the normal sterile fashion.  1%  Lidocaine was used for local anesthesia.  Under ultrasound guidance a 19 gauge Yueh catheter was introduced.  Paracentesis was performed.  The catheter was removed and a dressing applied.  Complications:  none  Findings:  A total of approximately 5.3 liters of amber colored fluid was removed.  A fluid sample was sent for laboratory analysis.  IMPRESSION: Successful ultrasound guided paracentesis yielding 5.3 liters of ascites.  Read By: Pattricia Boss PA-C   Original Report Authenticated By: D. Andria Rhein, MD     Assessment / Plan:   1. Decompensated HCV / ETOH cirrhosis with encephalopathy, ascites, coagulopathy and esophageal varices (non-bleeding).  AFP 9.2.  S/p LVP (5 liters) yesterday. No SBP. SAAG c/w portal HTN.  Ascitic fluid culture and cytology still pending.  Will initiate diuretics (lasix 40mg  daily, aldactone 100mg  daily). Creatinine normal. Monitor weights  Low sodium diet  Continue Xifaxan and Lactulose  Complete total of 5 days of SBP prophylaxis given in setting of GI bleed / ascites  ETOH avoidance is paramount   2. Upper GI bleed secondary to gastric ulcer, s/p EGD with control of bleeding 03/03/13. He is H. Pylori positive. Will need treatment after completion of his SBP prophylaxis antibiotics. Recommend amoxicillin 1 g twice daily, clindamycin 500 twice daily x7 days with BID PPI  3.  Normocytic anemia, s/p 2 units of blood 8/21. Hgb has drifted slightly since transfusion, currently  at 7.1 without overt bleeding. Would benefit from oral iron at home.   4. Tachycardia, tachypnea .  His 02 sats are 95% and above. Repeat scheduled for CBC at 2pm, he may need additional unit of blood.     LOS: 5 days   Willette Cluster  03/07/2013, 9:20 AM

## 2013-03-07 NOTE — Progress Notes (Addendum)
Called in room by coworker. Pt. Found in floor in dark colored stool. Tech in room to to give bedpan and he refused to use the bedpan and he states that he got up on his own floor. Pt. Bathed and gown changed. No apparent injury. Denies any pain.

## 2013-03-07 NOTE — Progress Notes (Signed)
C/o chest pain this am. EKg done and placed on chart by tech. Dr. Sharon Seller made aware on rounds this am. No new orders given.

## 2013-03-07 NOTE — Progress Notes (Signed)
Called Revonda Standard to verify if Ekg had been read. Verified that the EKG was ok.

## 2013-03-07 NOTE — Progress Notes (Signed)
Inpatient Diabetes Program Recommendations  AACE/ADA: New Consensus Statement on Inpatient Glycemic Control (2013)  Target Ranges:  Prepandial:   less than 140 mg/dL      Peak postprandial:   less than 180 mg/dL (1-2 hours)      Critically ill patients:  140 - 180 mg/dL   Reason for Visit: Results for Curtis Estrada, Curtis Estrada (MRN [id number]) as of 03/07/2013 12:52  Ref. Range 03/06/2013 12:18 03/06/2013 16:52 03/06/2013 21:41 03/07/2013 08:00 03/07/2013 12:01  Glucose-Capillary Latest Range: 70-99 mg/dL 409 (H) 811 (H) 914 (H) 251 (H) 221 (H)   Please consider adding Levemir 10 units while in the hospital.

## 2013-03-07 NOTE — Progress Notes (Signed)
[facility] Progress Note [place] TEAM 1 - Stepdown ICU Team   Damian Buckles OZH:[id number] DOB: 1956/02/06 DOA: 03/02/2013 PCP: [facility]  Brief narrative: 57 year old male patient with known underlying alcoholism. Presented to the ER with complaints of abdominal swelling, melena, and hematemesis. He apparently quit drinking 2 weeks prior and this correlates with the onset of the symptoms except for the GI bleeding symptoms which had only been present for 2 days. Patient denied prior history of EGD or colonoscopy and had never been told he has cirrhosis or varices per his report. While in the emergency department he had an episode of coffee-ground emesis and was tachycardic with rates in the 140s. He was given fluid resuscitation. Guaiac was positive. Hemoglobin one month ago was 12;  Hgb was down to 10 in the ER.  Assessment/Plan:  Encephalopathy - Hepatic +/- EtOH withdrawal  lactulose dosing continues - MS continues to improve - cont lactulose + rifaximin   Hematemesis / UGIB due to ulcer CT of the abdomen and pelvis in July revealed upper abdominal varices - GI consulted - EGD: non bleeding varices, 2 x2 cm ulcer at incisura - completed IV PPI gtt for total of 72 hours >> BID PPI  X 12 weeks  H pylori + Initiate tx regimen at time of d/c   Acute blood loss anemia hgb drifted down after hydration to 7.7 - transfuse for sx's such as hypotension or tachycardia or if hgb </= 7.0 (no known hx of CAD) - hgb drifting down therefore f/u CBC today @ 2pm  Cirrhosis, alcoholic and Hepatitis C - 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 with large volume ascites  pt denied being told has cirrhosis - counseled on ETOH cessation - overall prognosis is poor - underwent large volume paracentesis 8/24 - tolerating well thus far - GI ordered albumin   Acute respiratory failure with hypoxia CXR revealed edema but also very crowded lung fields due to significant ascites - much improved post paracentesis    Coagulopathy No signif response to Vitamin K at this time   Elevated lipase mild elevation which has trended down since admit - signif elevated today, but pt has no sx to suggest pancreatitis - follow clinically - rpt on 8/25 down to 125  Diabetes mellitus, type 2?? was on glucophage as outpt - HgbA1c <6 - CBG has been well controlled but note trend up so will resume SSI  Chronic wound of extremity-right great toe/Decreased pedal pulses has been on anbx's previously - toe not draining at present - note diminished pulses - arterial duplex with abnormal right pedal waveforms but normal ABI's (preliminary report)   DVT prophylaxis: SCDs Code Status: FULL Family Communication: no family present at time of exam today  Disposition Plan/Expected LOS: Stepdown   Consultants: Gastroenterology  Procedures: EGD Paracentesis 8/24   Antibiotics: Cefepime 8/21 >>>8/25  HPI/Subjective: Alert and conversant.  Eating breakfast. Says is breathing better  Objective: Blood pressure 106/66, pulse 121, temperature 99.7 F (37.6 C), temperature source Oral, resp. rate 32, height 5\' 7"  (1.702 m), weight 90.6 kg (199 lb 11.8 oz), SpO2 97.00%.  Intake/Output Summary (Last 24 hours) at 03/07/13 1053 Last data filed at 03/07/13 0900  Gross per 24 hour  Intake   2136 ml  Output    960 ml  Net   1176 ml    Exam: General: No acute respiratory distress Lungs: Clear to auscultation bilaterally without wheezes or crackles Cardiovascular: Regular rate and rhythm without murmur gallop or rub - no  peripheral edema  Abdomen: much less distended w/ ascites, without guarding or rebound, tympanic, bowel sounds positive, no appreciable mass Musculoskeletal: No significant cyanosis, clubbing of bilateral lower extremities Neurological: much more alert - follows commands - no focal neuro deficits  Scheduled Meds:  Scheduled Meds: . ceFEPime (MAXIPIME) IV  1 g Intravenous Q12H  . feeding supplement  237  mL Oral BID BM  . feeding supplement  1 Container Oral TID BM  . insulin aspart  0-5 Units Subcutaneous QHS  . insulin aspart  0-9 Units Subcutaneous TID WC  . lactulose  30 g Oral BID   Or  . lactulose  300 mL Rectal BID  . pantoprazole  40 mg Oral BID AC  . propranolol  10 mg Oral TID  . rifaximin  550 mg Oral BID  . sodium chloride  3 mL Intravenous Q12H   Data Reviewed: Basic Metabolic Panel:  Recent Labs Lab 03/03/13 0810 03/04/13 0510 03/05/13 0600 03/06/13 0500 03/07/13 0700  NA 141 142 145 143 135  K 5.0 4.4 3.6 3.7 3.3*  CL 110 111 115* 114* 106  CO2 17* 21 24 25 20   GLUCOSE 156* 153* 163* 133* 246*  BUN 23 23 18 15 12   CREATININE 0.90 0.90 0.96 1.00 0.79  CALCIUM 8.1* 8.4 8.4 8.5 8.1*   Liver Function Tests:  Recent Labs Lab 03/02/13 1845 03/03/13 0810 03/04/13 0510 03/05/13 0600 03/07/13 0700  AST 67* 69* 106* 76* 65*  ALT 41 41 53 46 34  ALKPHOS 104 83 72 68 102  BILITOT 1.3* 1.3* 1.2 1.2 1.4*  PROT 5.9* 5.3* 5.5* 5.6* 5.0*  ALBUMIN 2.0* 1.9* 2.2* 2.2* 2.5*    Recent Labs Lab 03/02/13 1845 03/03/13 0810 03/06/13 0500 03/07/13 0700  LIPASE 71* 55 1088* 129*    Recent Labs Lab 03/03/13 1445 03/04/13 0510  AMMONIA 225* 79*   CBC:  Recent Labs Lab 03/02/13 1845  03/03/13 1859 03/04/13 0510 03/05/13 0600 03/06/13 0500 03/07/13 0700  WBC 9.6  < > 16.4* 15.5* 13.8* 11.5* 10.9*  NEUTROABS 5.4  --   --   --   --   --   --   HGB 10.1*  < > 8.0* 7.7* 7.7* 8.2* 7.1*  HCT 30.2*  < > 22.9* 22.3* 22.1* 24.7* 20.6*  MCV 83.7  < > 82.7 83.2 84.0 85.2 82.7  PLT 197  < > 212 186 182 205 176  < > = values in this interval not displayed. CBG:  Recent Labs Lab 03/06/13 0757 03/06/13 1218 03/06/13 1652 03/06/13 2141 03/07/13 0800  GLUCAP 116* 163* 157* 205* 251*    Recent Results (from the past 240 hour(s))  MRSA PCR SCREENING     Status: None   Collection Time    03/03/13  1:09 AM      Result Value Range Status   MRSA by PCR  NEGATIVE  NEGATIVE Final   Comment:            The GeneXpert MRSA Assay (FDA     approved for NASAL specimens     only), is one component of a     comprehensive MRSA colonization     surveillance program. It is not     intended to diagnose MRSA     infection nor to guide or     monitor treatment for     MRSA infections.  BODY FLUID CULTURE     Status: None   Collection Time    03/06/13  10:29 AM      Result Value Range Status   Specimen Description ASCITIC FLUID   Final   Special Requests Normal   Final   Gram Stain     Final   Value: RARE WBC PRESENT, PREDOMINANTLY PMN     NO ORGANISMS SEEN     Performed at [facility]   Culture     Final   Value: NO GROWTH 1 DAY     Performed at [facility]   Report Status PENDING   Incomplete     Studies:  Recent x-ray studies have been reviewed in detail by the Attending Physician   Junious Silk, ANP [facility] Office  [phone] Pager [phone]  On-Call/Text Page:      [email or web address]      password [facility]  03/07/2013, 10:53 AM   LOS: 5 days   I have personally examined this patient and reviewed the entire database. I have reviewed the above note, made any necessary editorial changes, and agree with its content.  Lonia Blood, MD [facility]

## 2013-03-07 NOTE — Progress Notes (Signed)
Patient seen, examined, and I agree with the above documentation, including the assessment and plan. Hemoglobin down slightly since yesterday, continue to monitor daily and monitor for recurrent melena or signs of overt rebleeding. Agree with oral iron for one to 2 months. Initiation of diuretics with close monitoring of renal function

## 2013-03-07 NOTE — Progress Notes (Signed)
Maren Reamer returned page and advised of incident. No orders given. Report given to night shift who will notify MD with any changes.

## 2013-03-08 DIAGNOSIS — R609 Edema, unspecified: Secondary | ICD-10-CM

## 2013-03-08 LAB — BASIC METABOLIC PANEL
CO2: 22 mEq/L (ref 19–32)
Calcium: 8.3 mg/dL — ABNORMAL LOW (ref 8.4–10.5)
Creatinine, Ser: 0.78 mg/dL (ref 0.50–1.35)
GFR calc non Af Amer: >90 mL/min (ref >90)
Glucose, Bld: 158 mg/dL — ABNORMAL HIGH (ref 70–99)
Sodium: 138 mEq/L (ref 135–145)

## 2013-03-08 LAB — CBC
MCH: 28.5 pg (ref 26.0–34.0)
MCHC: 35.8 g/dL (ref 30.0–36.0)
MCV: 79.6 fL (ref 78.0–100.0)
Platelets: 206 10*3/uL (ref 150–400)
RBC: 2.7 MIL/uL — ABNORMAL LOW (ref 4.22–5.81)

## 2013-03-08 LAB — GLUCOSE, CAPILLARY
Glucose-Capillary: 137 mg/dL — ABNORMAL HIGH (ref 70–99)
Glucose-Capillary: 211 mg/dL — ABNORMAL HIGH (ref 70–99)

## 2013-03-08 MED ORDER — FUROSEMIDE 40 MG PO TABS: 40.0000 mg | ORAL_TABLET | Freq: Two times a day (BID) | ORAL | Status: DC

## 2013-03-08 MED ORDER — LACTULOSE 10 GM/15ML PO SOLN
20.0000 g | Freq: Two times a day (BID) | ORAL | Status: DC
  Administered 2013-03-08 – 2013-03-11 (×7): 20 g via ORAL
  Filled 2013-03-08 (×9): qty 30

## 2013-03-08 MED ORDER — METRONIDAZOLE 500 MG PO TABS
500.0000 mg | ORAL_TABLET | Freq: Two times a day (BID) | ORAL | Status: DC
  Administered 2013-03-09 – 2013-03-11 (×5): 500 mg via ORAL
  Filled 2013-03-08 (×6): qty 1

## 2013-03-08 MED ORDER — CLARITHROMYCIN 500 MG PO TABS
500.0000 mg | ORAL_TABLET | Freq: Two times a day (BID) | ORAL | Status: DC
  Administered 2013-03-09 – 2013-03-11 (×5): 500 mg via ORAL
  Filled 2013-03-08 (×6): qty 1

## 2013-03-08 MED ORDER — LACTULOSE 10 GM/15ML PO SOLN
20.0000 g | Freq: Two times a day (BID) | ORAL | Status: DC
  Filled 2013-03-08 (×2): qty 30

## 2013-03-08 MED ORDER — SPIRONOLACTONE 100 MG PO TABS: 100.0000 mg | ORAL_TABLET | Freq: Two times a day (BID) | ORAL | Status: DC

## 2013-03-08 MED ORDER — SPIRONOLACTONE 100 MG PO TABS
100.0000 mg | ORAL_TABLET | Freq: Every day | ORAL | Status: DC
  Administered 2013-03-08 – 2013-03-11 (×4): 100 mg via ORAL
  Filled 2013-03-08 (×4): qty 1

## 2013-03-08 MED ORDER — FUROSEMIDE 40 MG PO TABS
40.0000 mg | ORAL_TABLET | Freq: Every day | ORAL | Status: DC
  Administered 2013-03-08 – 2013-03-09 (×2): 40 mg via ORAL
  Filled 2013-03-08 (×2): qty 1

## 2013-03-08 NOTE — Progress Notes (Signed)
[facility] Daily Rounding Note 03/08/2013, 9:38 AM  SUBJECTIVE:       Underwent LE doppler study this AM, again no DVT. Enlarged inguinal lymph nodes bil noted.  Pt found on floor surrounded by dark colored stool at 1830 last evening. After Lactulose, Rifaxamin had finally started passing stool and this occurred all night long, none so far this AM.  Feels tired.  Belly feels distended and uncomfortable again.   OBJECTIVE:         Vital signs in last 24 hours:    Temp:  [98.3 F (36.8 C)-99.6 F (37.6 C)] 98.3 F (36.8 C) (08/26 0814) Pulse Rate:  [91-113] 96 (08/26 0911) Resp:  [14-25] 17 (08/26 0814) BP: (88-122)/(51-71) 88/51 mmHg (08/26 0911) SpO2:  [93 %-100 %] 99 % (08/26 0814) Last BM Date: 03/05/13 General: looks unwell, pleasant, lethargic, comfortable   Heart: RRR Chest: clear.  Unlabored breathing Abdomen: distended, tense, not tender.  Hypoactive BS.  No leaking from tap site in LLQ  Extremities: left > right LE edema.  3 plus on left, 2 plus on right.  Neuro/Psych:  No asterixis.  Appropriate.  Lethargic but arouseable.   Intake/Output from previous day: 08/25 0701 - 08/26 0700 In: 723 [P.O.:720; I.V.:3] Out: 2925 [Urine:1650; Stool:1275]  Intake/Output this shift:    Lab Results:  Recent Labs  03/07/13 0700 03/07/13 1400 03/08/13 0610  WBC 10.9* 12.0* 13.2*  HGB 7.1* 7.5* 7.7*  HCT 20.6* 21.4* 21.5*  PLT 176 190 206   BMET  Recent Labs  03/06/13 0500 03/07/13 0700 03/08/13 0610  NA 143 135 138  K 3.7 3.3* 3.9  CL 114* 106 109  CO2 25 20 22   GLUCOSE 133* 246* 158*  BUN 15 12 9   CREATININE 1.00 0.79 0.78  CALCIUM 8.5 8.1* 8.3*   LFT  Recent Labs  03/07/13 0700  PROT 5.0*  ALBUMIN 2.5*  AST 65*  ALT 34  ALKPHOS 102  BILITOT 1.4*   PT/INR  Recent Labs  03/06/13 0500  LABPROT 18.1*  INR 1.54*    Studies/Results: US Paracentesis  03/06/2013   *RADIOLOGY REPORT*  Clinical Data: ascites, cirrhosis  ULTRASOUND GUIDED  PARACENTESIS  An ultrasound guided paracentesis was thoroughly discussed with the patient and questions answered.  The benefits, risks, alternatives and complications were also discussed.  The patient understands and wishes to proceed with the procedure.  Written consent was obtained.  Ultrasound was performed to localize and mark an adequate pocket of fluid in the left lower quadrant of the abdomen.  The area was then prepped and draped in the normal sterile fashion.  1% Lidocaine was used for local anesthesia.  Under ultrasound guidance a 19 gauge Yueh catheter was introduced.  Paracentesis was performed.  The catheter was removed and a dressing applied.  Complications:  none  Findings:  A total of approximately 5.3 liters of amber colored fluid was removed.  A fluid sample was sent for laboratory analysis.  IMPRESSION: Successful ultrasound guided paracentesis yielding 5.3 liters of ascites.  Read By: Pattricia Boss PA-C   Original Report Authenticated By: D. Andria Rhein, MD    ASSESMENT: *  Decompensated cirrhosis in active alcoholic with Hepatitis C. alcolholic hepatitis improved.  *  Ascites, no SBP on 5.3 liter paracentesis 8/24.  On Aldactone 100 mg qday, Lasix 40 mg daily as of 8/26.  On low sodium diet.  Ascites seems to be reaccumulating. *  GI bleed.  EGD 03/03/13:  Clot adherent  to gastric ulcer treated with epi and endoclip. Transitioned to BID oral Protonix as of 8/24. Small esophageal varices noted, pt on Inderal as of 8/25.   Completed 5 day course of Cefepime.  *  Normocytic anemia. Do not see epic evidence for PRBC transfusions.  *  Coagulopathy, improved.  Got Subq Vitamin K 8/21-8/23. 2 of FFP on 8/20 *  Hepatic Encephalopathy:  On po lactulose and Xifaxan. Apex ammonia level 225 on 8/21.  Copious stooling overnight. The rectal lactulose was discontinued and oral Lactulose started this AM. *  Type 2 DM, NIDDM PTA.  On Levemir and SS Novolog currently. Marland Kitchen    PLAN: *  Does he still  need telemetry?  ? Transfer to floor?  *  Daily weights.  *  ? Begin the H Pylori treatment (amoxicillin 1 g twice daily, clindamycin 500 twice daily x7 days with BID PPI) now?   *  ? Increase diuretics?   *  ? Discontinue Rifaxamin in 24 to 48 hours.  Decrease lactulose if having more than 3 stools in 24 hours.     LOS: 6 days   Jennye Moccasin  03/08/2013, 9:38 AM Pager: [phone]

## 2013-03-08 NOTE — Progress Notes (Signed)
[facility] Progress Note [place] TEAM 1 - Stepdown ICU Team   Curtis Estrada AOZ:[id number] DOB: 05/07/1956 DOA: 03/02/2013 PCP: [facility]  Brief narrative: 57 year old male patient with known underlying alcoholism. Presented to the ER with complaints of abdominal swelling, melena, and hematemesis. He apparently quit drinking 2 weeks prior and this correlates with the onset of the symptoms except for the GI bleeding symptoms which had only been present for 2 days. Patient denied prior history of EGD or colonoscopy and had never been told he has cirrhosis or varices per his report. While in the emergency department he had an episode of coffee-ground emesis and was tachycardic with rates in the 140s. He was given fluid resuscitation. Guaiac was positive. Hemoglobin one month ago was 12;  Hgb was down to 10 in the ER.  Assessment/Plan:  Encephalopathy - Hepatic +/- EtOH withdrawal  lactulose dosing continues - MS continues to improve - cont lactulose + rifaximin -will decr. Lactulose dose due to diarrhea/check ammonia in am  Hematemesis / UGIB due to ulcer CT of the abdomen and pelvis in July revealed upper abdominal varices - GI consulted - EGD: non bleeding varices, 2 x2 cm ulcer at incisura - completed IV PPI gtt for total of 72 hours >> BID PPI  X 12 weeks  H pylori + Initiate tx regimen at time of d/c   Acute blood loss anemia hgb drifted down after hydration to 7.7 - transfuse for sx's such as hypotension or tachycardia or if hgb </= 7.0 (no known hx of CAD) - hgb drifting down therefore f/u CBC today @ 2pm  Cirrhosis, alcoholic and Hepatitis C - 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 with large volume ascites  pt denied being told has cirrhosis - counseled on ETOH cessation - overall prognosis is poor - underwent large volume paracentesis 8/24 - tolerating well thus far - GI ordered albumin -BP soft so prn hold BB in favor of diuretics  Ascites abd tight and swollen once again- follow I and O  and ensure diureses is adequate.   Acute respiratory failure with hypoxia CXR revealed edema but also very crowded lung fields due to significant ascites - much improved post paracentesis   Lower extremity and scrotal/penile edema -cont foley for now -check venous duplex to r/o DVT -suspect due to recent significant ascites with compression of venous return-diuretics as above  Coagulopathy No signif response to Vitamin K at this time   Elevated lipase mild elevation which has trended down since admit - signif elevated today, but pt has no sx to suggest pancreatitis - follow clinically - rpt on 8/25 down to 125  Diabetes mellitus, type 2?? was on glucophage as outpt - HgbA1c <6 - CBG has been well controlled but note trend up so will resume SSI  Chronic wound of extremity-right great toe/Decreased pedal pulses has been on anbx's previously - toe not draining at present - note diminished pulses - arterial duplex with abnormal right pedal waveforms but normal ABI's (preliminary report)  Physical deconditioning  -PT/OT eval   DVT prophylaxis: SCDs Code Status: FULL Family Communication: no family present at time of exam today  Disposition Plan/Expected LOS: Stepdown - Hopefully transfer out in next 24 hrs.   Consultants: Gastroenterology  Procedures: EGD Paracentesis 8/24   Antibiotics: Cefepime 8/21 >>>8/25  HPI/Subjective: Alert and conversant.  Sitting on side of bed. No complaints.  Objective: Blood pressure 88/51, pulse 96, temperature 98.3 F (36.8 C), temperature source Oral, resp. rate 17, height 5\' 7"  (  1.702 m), weight 90.6 kg (199 lb 11.8 oz), SpO2 99.00%.  Intake/Output Summary (Last 24 hours) at 03/08/13 1100 Last data filed at 03/08/13 0900  Gross per 24 hour  Intake      3 ml  Output   2600 ml  Net  -2597 ml    Exam: General: No acute respiratory distress Lungs: Clear to auscultation bilaterally without wheezes or crackles Cardiovascular: Regular  rate and rhythm without murmur gallop or rub - no peripheral edema per se in feet/lower legs but noted with marked pitting thigh edema with scrotal and pedal edema Abdomen: tightly distended w/ ascites, without guarding or rebound, tympanic, bowel sounds positive, no appreciable mass Musculoskeletal: No significant cyanosis, clubbing of bilateral lower extremities Neurological: much more alert - follows commands - no focal neuro deficits  Scheduled Meds:  Scheduled Meds: . feeding supplement  237 mL Oral BID BM  . feeding supplement  1 Container Oral TID BM  . furosemide  40 mg Oral Daily  . insulin aspart  0-5 Units Subcutaneous QHS  . insulin aspart  0-9 Units Subcutaneous TID WC  . insulin detemir  10 Units Subcutaneous QHS  . lactulose  20 g Oral BID  . pantoprazole  40 mg Oral BID AC  . propranolol  10 mg Oral TID  . rifaximin  550 mg Oral BID  . sodium chloride  3 mL Intravenous Q12H  . spironolactone  100 mg Oral Daily   Data Reviewed: Basic Metabolic Panel:  Recent Labs Lab 03/04/13 0510 03/05/13 0600 03/06/13 0500 03/07/13 0700 03/08/13 0610  NA 142 145 143 135 138  K 4.4 3.6 3.7 3.3* 3.9  CL 111 115* 114* 106 109  CO2 21 24 25 20 22   GLUCOSE 153* 163* 133* 246* 158*  BUN 23 18 15 12 9   CREATININE 0.90 0.96 1.00 0.79 0.78  CALCIUM 8.4 8.4 8.5 8.1* 8.3*   Liver Function Tests:  Recent Labs Lab 03/02/13 1845 03/03/13 0810 03/04/13 0510 03/05/13 0600 03/07/13 0700  AST 67* 69* 106* 76* 65*  ALT 41 41 53 46 34  ALKPHOS 104 83 72 68 102  BILITOT 1.3* 1.3* 1.2 1.2 1.4*  PROT 5.9* 5.3* 5.5* 5.6* 5.0*  ALBUMIN 2.0* 1.9* 2.2* 2.2* 2.5*    Recent Labs Lab 03/02/13 1845 03/03/13 0810 03/06/13 0500 03/07/13 0700  LIPASE 71* 55 1088* 129*    Recent Labs Lab 03/03/13 1445 03/04/13 0510  AMMONIA 225* 79*   CBC:  Recent Labs Lab 03/02/13 1845  03/05/13 0600 03/06/13 0500 03/07/13 0700 03/07/13 1400 03/08/13 0610  WBC 9.6  < > 13.8* 11.5* 10.9*  12.0* 13.2*  NEUTROABS 5.4  --   --   --   --   --   --   HGB 10.1*  < > 7.7* 8.2* 7.1* 7.5* 7.7*  HCT 30.2*  < > 22.1* 24.7* 20.6* 21.4* 21.5*  MCV 83.7  < > 84.0 85.2 82.7 80.8 79.6  PLT 197  < > 182 205 176 190 206  < > = values in this interval not displayed. CBG:  Recent Labs Lab 03/07/13 0800 03/07/13 1201 03/07/13 1640 03/07/13 2026 03/08/13 0811  GLUCAP 251* 221* 239* 191* 137*    Recent Results (from the past 240 hour(s))  MRSA PCR SCREENING     Status: None   Collection Time    03/03/13  1:09 AM      Result Value Range Status   MRSA by PCR NEGATIVE  NEGATIVE Final  Comment:            The GeneXpert MRSA Assay (FDA     approved for NASAL specimens     only), is one component of a     comprehensive MRSA colonization     surveillance program. It is not     intended to diagnose MRSA     infection nor to guide or     monitor treatment for     MRSA infections.  BODY FLUID CULTURE     Status: None   Collection Time    03/06/13 10:29 AM      Result Value Range Status   Specimen Description ASCITIC FLUID   Final   Special Requests Normal   Final   Gram Stain     Final   Value: RARE WBC PRESENT, PREDOMINANTLY PMN     NO ORGANISMS SEEN     Performed at [facility]   Culture     Final   Value: NO GROWTH 1 DAY     Performed at [facility]   Report Status PENDING   Incomplete     Studies:  Recent x-ray studies have been reviewed in detail by the Attending Physician   Junious Silk, ANP [facility] Office  [phone] Pager [phone]  On-Call/Text Page:      [email or web address]      password [facility]  03/08/2013, 11:00 AM   LOS: 6 days    I have examined the patient, reviewed the chart and modified the above note which I agree with.   Samnang Shugars,MD [phone] 03/08/2013, 3:41 PM

## 2013-03-08 NOTE — Evaluation (Signed)
Physical Therapy Evaluation Patient Details Name: Maximilliano Kersh MRN: [id number] DOB: 1955-09-26 Today's Date: 03/08/2013 Time: 4098-1191 PT Time Calculation (min): 16 min  PT Assessment / Plan / Recommendation History of Present Illness  57 year old male patient with known underlying alcoholism. Presented to the ER with complaints of abdominal swelling, melena, and hematemesis. He apparently quit drinking 2 weeks prior and this correlates with the onset of the symptoms except for the GI bleeding symptoms which had only been present for 2 days. Patient denied prior history of EGD or colonoscopy and had never been told he has cirrhosis or varices per his report. While in the emergency department he had an episode of coffee-ground emesis and was tachycardic with rates in the 140s. He was given fluid resuscitation. Guaiac was positive. Hemoglobin one month ago was 12;  Hgb was down to 10 in the ER.  Clinical Impression  Pt with distended abdomen and scrotal bilat LE edema limiting OOB mobility tolerance. Pt currently unsafe to return home alone at this time. Pt to benefit from ST-SNF to achieve safe mod I function for safe transition home alone.    PT Assessment  Patient needs continued PT services    Follow Up Recommendations  SNF;Supervision/Assistance - 24 hour    Does the patient have the potential to tolerate intense rehabilitation      Barriers to Discharge Decreased caregiver support      Equipment Recommendations  Rolling walker with 5" wheels    Recommendations for Other Services     Frequency Min 3X/week    Precautions / Restrictions Precautions Precautions: Fall Restrictions Weight Bearing Restrictions: No   Pertinent Vitals/Pain Denies pain       Mobility  Bed Mobility Bed Mobility: Supine to Sit;Sitting - Scoot to Edge of Bed Supine to Sit: 3: Mod assist;With rails;HOB elevated Sitting - Scoot to [place] of Bed: 4: Min assist;With rail Details for Bed Mobility  Assistance: pt with limited hip flexion due to distended abdomen Transfers Transfers: Sit to Stand;Stand to Sit Sit to Stand: 4: Min assist;With upper extremity assist;From bed Stand to Sit: 4: Min assist;With upper extremity assist;To chair/3-in-1 Details for Transfer Assistance: v/c's for hand placement and safety Ambulation/Gait Ambulation/Gait Assistance: 4: Min assist Ambulation Distance (Feet): 45 Feet Assistive device: Rolling walker Ambulation/Gait Assistance Details: no episodes of LOB, extremely slow due to fatigue and bilat LE and scrotal edema. pt endurance limited by fatigue and urgency to use BSC due to pt being on lactulose Gait Pattern: Step-to pattern;Decreased stride length;Wide base of support;Trunk flexed Gait velocity: slow Stairs: No    Exercises     PT Diagnosis: Difficulty walking;Generalized weakness  PT Problem List: Decreased strength;Decreased activity tolerance;Decreased mobility PT Treatment Interventions: DME instruction;Gait training;Functional mobility training;Therapeutic activities;Therapeutic exercise     PT Goals(Current goals can be found in the care plan section) Acute Rehab PT Goals Patient Stated Goal: to get better PT Goal Formulation: With patient Time For Goal Achievement: 03/22/13 Potential to Achieve Goals: Good  Visit Information  Last PT Received On: 03/08/13 Assistance Needed: +1 History of Present Illness: 57 year old male patient with known underlying alcoholism. Presented to the ER with complaints of abdominal swelling, melena, and hematemesis. He apparently quit drinking 2 weeks prior and this correlates with the onset of the symptoms except for the GI bleeding symptoms which had only been present for 2 days. Patient denied prior history of EGD or colonoscopy and had never been told he has cirrhosis or varices per his report. While in  the emergency department he had an episode of coffee-ground emesis and was tachycardic with rates  in the 140s. He was given fluid resuscitation. Guaiac was positive. Hemoglobin one month ago was 12;  Hgb was down to 10 in the ER.       Prior Functioning  Home Living Family/patient expects to be discharged to:: Skilled nursing facility Living Arrangements: Alone Additional Comments: pt lived alone and was indep PTA Prior Function Level of Independence: Independent Communication Communication: No difficulties Dominant Hand: Right    Cognition  Cognition Arousal/Alertness: Awake/alert Behavior During Therapy: WFL for tasks assessed/performed Overall Cognitive Status: Within Functional Limits for tasks assessed    Extremity/Trunk Assessment Upper Extremity Assessment Upper Extremity Assessment: Overall WFL for tasks assessed Lower Extremity Assessment Lower Extremity Assessment:  (noted bilat LE edema) Cervical / Trunk Assessment Cervical / Trunk Assessment:  (abdominal ascities)   Balance Balance Balance Assessed: Yes Static Sitting Balance Static Sitting - Balance Support: No upper extremity supported;Feet supported Static Sitting - Level of Assistance: 6: Modified independent (Device/Increase time) Static Sitting - Comment/# of Minutes: pt witnessed sitting EOB to eat lunch  End of Session PT - End of Session Equipment Utilized During Treatment: Gait belt Activity Tolerance: Patient limited by fatigue Patient left:  (on [facility] - RN notified) Nurse Communication: Mobility status  GP     Marcene Brawn 03/08/2013, 2:37 PM  Lewis Shock, PT, DPT Pager #: [phone] Office #: [phone]

## 2013-03-08 NOTE — Progress Notes (Addendum)
Patient seen, examined, and I agree with the above documentation, including the assessment and plan. IV antibiotics for SBP prophylaxis, okay to start H. pylori treatment.   penicillin allergic, this is the initial treatment to our knowledge, starting patient on metronidazole 500 twice daily and clarithromycin 500 twice a day x10 days with twice a day PPI (needs to be continued for 10 days total at d/c from hospital) Would not increase diuretics at this point as they were just started. Continue Lasix 40 mg and spironolactone 100 mg daily Continue lactulose to maintain 2-3 soft but formed stools daily Patient can afford rifaximin it can be continued at discharge 550 mg twice a day Low-sodium diet Stop all alcohol Liver followup after discharge

## 2013-03-08 NOTE — Progress Notes (Signed)
*  PRELIMINARY RESULTS* Vascular Ultrasound Lower extremity venous duplex has been completed.  Preliminary findings: negative for DVT and baker's cyst.  Pitting edema noted bilaterally. Enlarged inguinal lymph nodes noted bilaterally.   Farrel Demark, RDMS, RVT  03/08/2013, 10:05 AM

## 2013-03-09 LAB — CBC
HCT: 22.9 % — ABNORMAL LOW (ref 39.0–52.0)
MCH: 27.9 pg (ref 26.0–34.0)
MCHC: 34.5 g/dL (ref 30.0–36.0)
RDW: 16.3 % — ABNORMAL HIGH (ref 11.5–15.5)

## 2013-03-09 LAB — GLUCOSE, CAPILLARY
Glucose-Capillary: 258 mg/dL — ABNORMAL HIGH (ref 70–99)
Glucose-Capillary: 200 mg/dL — ABNORMAL HIGH (ref 70–99)

## 2013-03-09 LAB — AMMONIA: Ammonia: 30 umol/L (ref 11–60)

## 2013-03-09 MED ORDER — FUROSEMIDE 40 MG PO TABS
40.0000 mg | ORAL_TABLET | Freq: Two times a day (BID) | ORAL | Status: DC
  Administered 2013-03-10 – 2013-03-11 (×3): 40 mg via ORAL
  Filled 2013-03-09 (×7): qty 1

## 2013-03-09 MED ORDER — INSULIN ASPART 100 UNIT/ML ~~LOC~~ SOLN
3.0000 [IU] | Freq: Three times a day (TID) | SUBCUTANEOUS | Status: DC
  Administered 2013-03-09 – 2013-03-11 (×6): 3 [IU] via SUBCUTANEOUS

## 2013-03-09 NOTE — Progress Notes (Signed)
OT Cancellation Note  Patient Details Name: Curtis Estrada MRN: [id number] DOB: May 25, 1956   Cancelled Treatment:    Reason Eval/Treat Not Completed: Patient declined, no reason specified (pt c/o being too fatigued. will see in am)  [facility] Magdelena Kinsella, OTR/L  [phone] 03/09/2013 03/09/2013, 5:00 PM

## 2013-03-09 NOTE — Care Management Note (Addendum)
    Page 1 of 2   03/11/2013     12:32:26 PM   CARE MANAGEMENT NOTE 03/11/2013  Patient:  Curtis Estrada, Curtis Estrada   Account Number:  [id number]  Date Initiated:  03/04/2013  Documentation initiated by:  Alvira Philips Assessment:   57 yr-old male adm with dx of GI Bleed; lives alone @ [address]     Action/Plan:   pt eval- snf   Anticipated DC Date:  03/11/2013   Anticipated DC Plan:  SKILLED NURSING FACILITY  In-house referral  Clinical Social Worker      DC Planning Services  CM consult      Choice offered to / List presented to:             Status of service:  Completed, signed off Medicare Important Message given?   (If response is "NO", the following Medicare IM given date fields will be blank) Date Medicare IM given:   Date Additional Medicare IM given:    Discharge Disposition:  SKILLED NURSING FACILITY  Per UR Regulation:  Reviewed for med. necessity/level of care/duration of stay  If discussed at Long Length of Stay Meetings, dates discussed:   03/08/2013  03/10/2013    Comments:  03/11/13 12:31 Letha Cape RN, BSN [phone] patient dc to [facility] today, CSW following.  03/10/13 0946 Verdis Prime RN MSN BSN CCM Transfer packet faxed x 2 on 8/27 - fax failed.  TC from SW with community living center who provided another fax number.  Packet faxed. 1500 Received TC from SW @ [facility] - pt has been approved for 32 day stay @ contracted SNF.  Relayed message to 5W CSW, contact SW @ [place] is Peter Minium, [phone], ext [phone].  03/09/13 1330 Henrietta Mayo RN MSN BSN CCM Per PT, pt will need ST-SNF for rehab before returning to apt.  Met with sister, Levin Bacon, who will provide pt's VA information to admitting.  VM message left for Donia Pounds @ [facility] informing of need for rehab, return call requested.  Pt's sister is an employee @ [facility] [facility] in [place] and requests placement there if possible.  Will relay to CSW. 1344  Received TC from Donia Pounds who will fax packet requesting transfer to [facility] short-term rehab unit. If there are no beds, VA has contract with [facility] - informed CSW who will start process for placement there. 1357 Per NP request, inquired re coverage for Rifaximin and Levimir.  Sherlynn Stalls, VA pharmacist, states both are covered and meds can be overnighted if needed.  Scripts will need to be faxed to [phone].  03/08/13 2951 Verdis Prime RN MSN BSN CCM Repeat TC to [facility]. 1014 TC from April, transfer coordinator @ [place]. She transmitted information to [facility] and they will determine pt's benefit.  TC to sister, Levin Bacon ([phone]), requested she provide pt's VA insurance information to admitting dept.   PT/OT evals pending.  If pt needs rehab, Ludwig Lean is [place], #[phone], ext [phone].  03/04/13 1438 Henrietta Mayo RN MSN BSN CCM Per NP, pt is followed @ [place].  TC to transfer coordinator @ [phone], VM message left with admission information.

## 2013-03-09 NOTE — Progress Notes (Signed)
[facility] Progress Note [place] TEAM 1 - Stepdown ICU Team   Curtis Estrada UJW:[id number] DOB: February 06, 1956 DOA: 03/02/2013 PCP: [facility]  Brief narrative: 57 year old male patient with known underlying alcoholism. Presented to the ER with complaints of abdominal swelling, melena, and hematemesis. He apparently quit drinking 2 weeks prior and this correlates with the onset of the symptoms except for the GI bleeding symptoms which had only been present for 2 days. Patient denied prior history of EGD or colonoscopy and had never been told he has cirrhosis or varices per his report. While in the emergency department he had an episode of coffee-ground emesis and was tachycardic with rates in the 140s. He was given fluid resuscitation. Guaiac was positive. Hemoglobin one month ago was 12;  Hgb was down to 10 in the ER.  Assessment/Plan:  Encephalopathy - Hepatic +/- EtOH withdrawal  lactulose dosing continues - MS continues to improve - cont lactulose + rifaximin -will decr. 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 - GI consulted - EGD: non bleeding varices, 2 x2 cm ulcer at incisura - completed IV PPI gtt for total of 72 hours >> BID PPI  X 12 weeks  H pylori + GI initiated tx regimen (amoxicillin 1 g twice daily, clindamycin 500 twice daily x7 days)   Acute blood loss anemia hgb drifted down after hydration to 7.7 - transfuse for sx's such as hypotension or tachycardia or if hgb </= 7.0 (no known hx of CAD) - hgb stable between 7.5 and 7.9  Cirrhosis, alcoholic and Hepatitis C - 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 with large volume ascites  pt denied being told has cirrhosis - counseled on ETOH cessation - overall prognosis is poor - underwent large volume paracentesis 8/24 - tolerating well thus far - continue diuretics and BB  Ascites abd tight and swollen once again - follow I  and O and continue to titrate diuretic to find maximal dose  Acute respiratory failure with hypoxia much improved post paracentesis - most c/w abdom compartment interference   Lower extremity and scrotal/penile edema -cont foley for now -titrate diuretics as noted above   Coagulopathy No signif response to Vitamin K   Elevated lipase mild elevation which has trended down since admit - rpt on 8/25 down to 125  Diabetes mellitus, type 2?? was on glucophage as outpt - HgbA1c <6 - continue SSI and adjust dosing as needed  Chronic wound of extremity-right great toe/Decreased pedal pulses has been on anbx's previously - toe not draining at present - note diminished pulses - arterial duplex with abnormal right pedal waveforms but normal ABI's   Physical deconditioning  -PT/OT recommend SNF - d/w pt and family/agree - SW consulted   DVT prophylaxis: SCDs Code Status: FULL Family Communication: no family present at time of exam today  Disposition Plan/Expected LOS: Transfer to Floor-eventual dc to SNF once maximal diuretic dose is determined   Consultants: Gastroenterology  Procedures: EGD Paracentesis 8/24   Antibiotics: Cefepime 8/21 >>>8/25  HPI/Subjective: Alert and conversant.  Sitting on side of bed. No complaints.  Objective: Blood pressure 107/69, pulse 89, temperature 98.5 F (36.9 C), temperature source Oral, resp. rate 21, height 5\' 7"  (1.702 m), weight 88.4 kg (194 lb 14.2 oz), SpO2 97.00%.  Intake/Output Summary (Last 24 hours) at 03/09/13 1152 Last data filed at 03/09/13 0900  Gross per 24 hour  Intake    480 ml  Output    700 ml  Net   -220 ml    Exam: General: No acute respiratory distress Lungs: Clear to auscultation bilaterally without wheezes or crackles Cardiovascular: Regular rate and rhythm without murmur gallop or rub - no peripheral edema per se in feet/lower legs but noted with marked pitting thigh edema with scrotal/penile and pedal  edema Abdomen: tightly distended w/ ascites, without guarding or rebound, tympanic, bowel sounds positive, no appreciable mass Genitourinary; Foley - significant penile edema Musculoskeletal: No significant cyanosis, clubbing of bilateral lower extremities Neurological: much more alert - follows commands - no focal neuro deficits  Scheduled Meds:  Scheduled Meds: . clarithromycin  500 mg Oral Q12H  . feeding supplement  237 mL Oral BID BM  . feeding supplement  1 Container Oral TID BM  . furosemide  40 mg Oral Daily  . insulin aspart  0-5 Units Subcutaneous QHS  . insulin aspart  0-9 Units Subcutaneous TID WC  . insulin detemir  10 Units Subcutaneous QHS  . lactulose  20 g Oral BID  . metroNIDAZOLE  500 mg Oral Q12H  . pantoprazole  40 mg Oral BID AC  . propranolol  10 mg Oral TID  . rifaximin  550 mg Oral BID  . sodium chloride  3 mL Intravenous Q12H  . spironolactone  100 mg Oral Daily   Data Reviewed: Basic Metabolic Panel:  Recent Labs Lab 03/04/13 0510 03/05/13 0600 03/06/13 0500 03/07/13 0700 03/08/13 0610  NA 142 145 143 135 138  K 4.4 3.6 3.7 3.3* 3.9  CL 111 115* 114* 106 109  CO2 21 24 25 20 22   GLUCOSE 153* 163* 133* 246* 158*  BUN 23 18 15 12 9   CREATININE 0.90 0.96 1.00 0.79 0.78  CALCIUM 8.4 8.4 8.5 8.1* 8.3*   Liver Function Tests:  Recent Labs Lab 03/02/13 1845 03/03/13 0810 03/04/13 0510 03/05/13 0600 03/07/13 0700  AST 67* 69* 106* 76* 65*  ALT 41 41 53 46 34  ALKPHOS 104 83 72 68 102  BILITOT 1.3* 1.3* 1.2 1.2 1.4*  PROT 5.9* 5.3* 5.5* 5.6* 5.0*  ALBUMIN 2.0* 1.9* 2.2* 2.2* 2.5*    Recent Labs Lab 03/02/13 1845 03/03/13 0810 03/06/13 0500 03/07/13 0700  LIPASE 71* 55 1088* 129*    Recent Labs Lab 03/03/13 1445 03/04/13 0510 03/09/13 0430  AMMONIA 225* 79* 30   CBC:  Recent Labs Lab 03/02/13 1845  03/06/13 0500 03/07/13 0700 03/07/13 1400 03/08/13 0610 03/09/13 0430  WBC 9.6  < > 11.5* 10.9* 12.0* 13.2* 12.3*   NEUTROABS 5.4  --   --   --   --   --   --   HGB 10.1*  < > 8.2* 7.1* 7.5* 7.7* 7.9*  HCT 30.2*  < > 24.7* 20.6* 21.4* 21.5* 22.9*  MCV 83.7  < > 85.2 82.7 80.8 79.6 80.9  PLT 197  < > 205 176 190 206 230  < > = values in this interval not displayed. CBG:  Recent Labs Lab 03/08/13 1218 03/08/13 1650 03/08/13 2047 03/09/13 0035 03/09/13 0741  GLUCAP 247* 211* 183* 257* 180*    Recent Results (from the past 240 hour(s))  MRSA PCR SCREENING     Status: None   Collection Time    03/03/13  1:09 AM      Result Value Range Status   MRSA by PCR NEGATIVE  NEGATIVE Final   Comment:  The GeneXpert MRSA Assay (FDA     approved for NASAL specimens     only), is one component of a     comprehensive MRSA colonization     surveillance program. It is not     intended to diagnose MRSA     infection nor to guide or     monitor treatment for     MRSA infections.  BODY FLUID CULTURE     Status: None   Collection Time    03/06/13 10:29 AM      Result Value Range Status   Specimen Description ASCITIC FLUID   Final   Special Requests Normal   Final   Gram Stain     Final   Value: RARE WBC PRESENT, PREDOMINANTLY PMN     NO ORGANISMS SEEN     Performed at [facility]   Culture     Final   Value: NO GROWTH 2 DAYS     Performed at [facility]   Report Status PENDING   Incomplete     Studies:  Recent x-ray studies have been reviewed in detail by the Attending Physician   Junious Silk, ANP [facility] Office  [phone] Pager [phone]  On-Call/Text Page:      [email or web address]      password [facility]  03/09/2013, 11:52 AM   LOS: 7 days   I have personally examined this patient and reviewed the entire database. I have reviewed the above note, made any necessary editorial changes, and agree with its content.  Lonia Blood, MD [facility]

## 2013-03-09 NOTE — Clinical Social Work Note (Signed)
Clinical Social Work Department BRIEF PSYCHOSOCIAL ASSESSMENT 03/09/2013  Patient:  INFANT, ZINK     Account Number:  [id number]     Admit date:  03/02/2013  Clinical Social Worker:  Verl Blalock  Date/Time:  03/09/2013 02:50 PM  Referred by:  Care Management  Date Referred:  03/09/2013 Referred for  SNF Placement   Other Referral:   Interview type:  Patient Other interview type:   No family present at bedside - CM to update patient sister    PSYCHOSOCIAL DATA Living Status:  ALONE Admitted from facility:   Level of care:   Primary support name:  Levin Bacon  [phone] Primary support relationship to patient:  SIBLING Degree of support available:   Strong    CURRENT CONCERNS Current Concerns  Post-Acute Placement   Other Concerns:    SOCIAL WORK ASSESSMENT / PLAN Clinical Social Worker and CM met with patient at bedside to offer support and discuss patient needs.  Patient states that he lives alone at [facility].  Per PT recommendations, patient will need SNF at discharge.  CM explained to patient that due to his VA benefits we are completing paperwork to submit to the [place] for SNF approval. Patient understanding that he may get approved for short term rehab at [facility] or if no beds available must admit to a contracted VA facility locally [facility] [place]). Patient agreeable with discharge plan - CSW to await VA response to further pursue placement needs.    Clinical Social Worker remains available for support and to facilitate patient discharge needs once medically ready. CM and patient have agreed to update patient family regarding discharge plans.   Assessment/plan status:  Psychosocial Support/Ongoing Assessment of Needs Other assessment/ plan:   Information/referral to community resources:   Visual merchandiser will provide patient with continued resources throughout hospitalization pending VA approval.    PATIENT'S/FAMILY'S RESPONSE TO PLAN OF  CARE: Patient alert and oriented x3 laying in the bed.  Patient with supportive family and agreeable with discharge plan to [facility] vs. [place].  Patient verbalized his appreciation for CSW and CM support and concern.

## 2013-03-09 NOTE — Progress Notes (Signed)
Inpatient Diabetes Program Recommendations  AACE/ADA: New Consensus Statement on Inpatient Glycemic Control (2013)  Target Ranges:  Prepandial:   less than 140 mg/dL      Peak postprandial:   less than 180 mg/dL (1-2 hours)      Critically ill patients:  140 - 180 mg/dL   Post-prandial hyperglycemia  Inpatient Diabetes Program Recommendations Insulin - Meal Coverage: Please consider addition of meal coverage, starting at 3 units tidwc.  Note: Also, Metformin for home use would not be appropriate with liver disease.  Thank you, Lenor Coffin, RN, CNS, Diabetes Coordinator [phone])

## 2013-03-09 NOTE — Progress Notes (Signed)
Report called to Ginger, Curtis Estrada on 5w; all questions answered prior to transfer; Pt transferred to 5W26 with all pt belongings; no family present in room; Called Mamie Susette Racer (Sister) told her about the transfer and new room number; Ms. Susette Racer confirmed would communicate with rest of the family

## 2013-03-09 NOTE — Clinical Social Work Placement (Signed)
Clinical Social Work Department CLINICAL SOCIAL WORK PLACEMENT NOTE 03/09/2013  Patient:  Curtis Estrada, Curtis Estrada  Account Number:  [id number] Admit date:  03/02/2013  Clinical Social Worker:  Macario Golds, LCSW  Date/time:  03/09/2013 02:52 PM  Clinical Social Work is seeking post-discharge placement for this patient at the following level of care:   SKILLED NURSING   (*CSW will update this form in Epic as items are completed)   03/09/2013  Patient/family provided with [facility] Department of Clinical Social Work's list of facilities offering this level of care within the geographic area requested by the patient (or if unable, by the patient's family).  03/09/2013  Patient/family informed of their freedom to choose among providers that offer the needed level of care, that participate in Medicare, Medicaid or managed care program needed by the patient, have an available bed and are willing to accept the patient.  03/09/2013  Patient/family informed of [facility]' ownership interest in [facility], as well as of the fact that they are under no obligation to receive care at this facility.  PASARR submitted to EDS on 03/09/2013 PASARR number received from EDS on 03/09/2013  FL2 transmitted to all facilities in geographic area requested by pt/family on  03/09/2013 FL2 transmitted to all facilities within larger geographic area on   Patient informed that his/her managed care company has contracts with or will negotiate with  certain facilities, including the following:     Patient/family informed of bed offers received:   Patient chooses bed at  Physician recommends and patient chooses bed at    Patient to be transferred to  on   Patient to be transferred to facility by   The following physician request were entered in Epic:   Additional Comments: 08/27 Patient with VA benefits and contracted with [facility]

## 2013-03-09 NOTE — Progress Notes (Signed)
Patient transferred from stepdown. Oriented to room, skin intact, tele box 9 placed and showing at CMD. Bed alarm set will monitor.

## 2013-03-10 LAB — BODY FLUID CULTURE: Special Requests: NORMAL

## 2013-03-10 LAB — GLUCOSE, CAPILLARY
Glucose-Capillary: 196 mg/dL — ABNORMAL HIGH (ref 70–99)
Glucose-Capillary: 286 mg/dL — ABNORMAL HIGH (ref 70–99)

## 2013-03-10 LAB — CBC
HCT: 23.5 % — ABNORMAL LOW (ref 39.0–52.0)
Hemoglobin: 8 g/dL — ABNORMAL LOW (ref 13.0–17.0)
MCV: 79.9 fL (ref 78.0–100.0)
RBC: 2.94 MIL/uL — ABNORMAL LOW (ref 4.22–5.81)
WBC: 10.6 10*3/uL — ABNORMAL HIGH (ref 4.0–10.5)

## 2013-03-10 MED ORDER — OXYCODONE HCL 5 MG PO TABS: 5.0000 mg | ORAL_TABLET | Freq: Four times a day (QID) | ORAL | Status: DC | PRN

## 2013-03-10 MED ORDER — SPIRONOLACTONE 100 MG PO TABS: 100.0000 mg | ORAL_TABLET | Freq: Every day | ORAL | Status: DC

## 2013-03-10 MED ORDER — PROPRANOLOL HCL 10 MG PO TABS: 10.0000 mg | ORAL_TABLET | Freq: Three times a day (TID) | ORAL | Status: DC

## 2013-03-10 MED ORDER — ENSURE COMPLETE PO LIQD: 237.0000 mL | Freq: Two times a day (BID) | ORAL | Status: DC

## 2013-03-10 MED ORDER — INSULIN DETEMIR 100 UNIT/ML ~~LOC~~ SOLN: 10.0000 [IU] | Freq: Every day | SUBCUTANEOUS | Status: DC

## 2013-03-10 MED ORDER — INSULIN ASPART 100 UNIT/ML ~~LOC~~ SOLN: 3.0000 [IU] | Freq: Three times a day (TID) | SUBCUTANEOUS | Status: DC

## 2013-03-10 MED ORDER — METRONIDAZOLE 500 MG PO TABS: 500.0000 mg | ORAL_TABLET | Freq: Two times a day (BID) | ORAL | Status: DC

## 2013-03-10 MED ORDER — LACTULOSE 10 GM/15ML PO SOLN: 20.0000 g | Freq: Two times a day (BID) | ORAL | Status: DC

## 2013-03-10 MED ORDER — PANTOPRAZOLE SODIUM 40 MG PO TBEC: 40.0000 mg | DELAYED_RELEASE_TABLET | Freq: Two times a day (BID) | ORAL | Status: DC

## 2013-03-10 MED ORDER — CLARITHROMYCIN 500 MG PO TABS: 500.0000 mg | ORAL_TABLET | Freq: Two times a day (BID) | ORAL | Status: DC

## 2013-03-10 MED ORDER — INSULIN ASPART 100 UNIT/ML ~~LOC~~ SOLN: SUBCUTANEOUS | Status: DC

## 2013-03-10 MED ORDER — FUROSEMIDE 40 MG PO TABS: 40.0000 mg | ORAL_TABLET | Freq: Two times a day (BID) | ORAL | Status: DC

## 2013-03-10 MED ORDER — ENSURE PUDDING PO PUDG: 1.0000 | Freq: Three times a day (TID) | ORAL | Status: DC

## 2013-03-10 MED ORDER — RIFAXIMIN 550 MG PO TABS: 550.0000 mg | ORAL_TABLET | Freq: Two times a day (BID) | ORAL | Status: DC

## 2013-03-10 NOTE — Clinical Social Work Note (Signed)
[facility] SNF facility with VA has no available beds, therefore VA will extend contract to [facility]. VA sets up transportation of patient and has request the patient be ready for DC 03/11/13 at 10:00AM. 03/10/13 day shift RN given number for report, but facility may request additional report in morning. Patient will be transported via wheel chair Zenaida Niece, and [place] will supply wheel chair for patient. Patient and sister Alona Bene notified. Packet placed with chart.  Roddie Mc, [place], [place], [phone]

## 2013-03-10 NOTE — Progress Notes (Signed)
Physical Therapy Treatment Patient Details Name: Curtis Estrada MRN: [id number] DOB: 1956-07-04 Today's Date: 03/10/2013 Time: 1191-4782 PT Time Calculation (min): 13 min  PT Assessment / Plan / Recommendation  History of Present Illness 57 year old male patient with known underlying alcoholism. Presented to the ER with complaints of abdominal swelling, melena, and hematemesis. He apparently quit drinking 2 weeks prior and this correlates with the onset of the symptoms except for the GI bleeding symptoms which had only been present for 2 days. Patient denied prior history of EGD or colonoscopy and had never been told he has cirrhosis or varices per his report. While in the emergency department he had an episode of coffee-ground emesis and was tachycardic with rates in the 140s. He was given fluid resuscitation. Guaiac was positive. Hemoglobin one month ago was 12;  Hgb was down to 10 in the ER.   PT Comments   Pt is progressing slowly.  Continues to need someone with him at all times when he is up moving around.  He continues to be appropriate for SNF level therapies at discharge.    Follow Up Recommendations  SNF     Does the patient have the potential to tolerate intense rehabilitation    NA  Barriers to Discharge   None      Equipment Recommendations  Rolling walker with 5" wheels    Recommendations for Other Services   None  Frequency Min 2X/week   Progress towards PT Goals Progress towards PT goals: Progressing toward goals  Plan Frequency needs to be updated    Precautions / Restrictions Precautions Precautions: Fall Precaution Comments: scrotal edema Restrictions Weight Bearing Restrictions: No   Pertinent Vitals/Pain See vitals flow sheet.    Mobility  Bed Mobility Bed Mobility: Sit to Supine Supine to Sit: 6: Modified independent (Device/Increase time);With rails;HOB flat Sitting - Scoot to Edge of Bed: 6: Modified independent (Device/Increase time);With rail Sit to  Supine: 3: Mod assist Details for Bed Mobility Assistance: assist needed assist to get bil legs back into bed due to heaviness from edema.   Transfers Transfers: Sit to Stand;Stand to Sit Sit to Stand: 5: Supervision;With upper extremity assist;With armrests;From chair/3-in-1 Stand to Sit: 5: Supervision;Without upper extremity assist;With armrests;To bed Details for Transfer Assistance: supervision for safety due to slow speed of movement.   Ambulation/Gait Ambulation/Gait Assistance: 4: Min guard Ambulation Distance (Feet): 60 Feet Assistive device: Rolling walker Ambulation/Gait Assistance Details: min guard assist due to slow gait speed and flexed walking posture.   Gait Pattern: Step-through pattern;Shuffle;Trunk flexed Gait velocity: less than 1.8 ft/sec indicating risk of recurrent falls General Gait Details: verbal cues for upright posture and to stay closer to RW to support legs.        PT Goals (current goals can now be found in the care plan section) Acute Rehab PT Goals Patient Stated Goal: to get better  Visit Information  Last PT Received On: 03/10/13 Assistance Needed: +1 History of Present Illness: 57 year old male patient with known underlying alcoholism. Presented to the ER with complaints of abdominal swelling, melena, and hematemesis. He apparently quit drinking 2 weeks prior and this correlates with the onset of the symptoms except for the GI bleeding symptoms which had only been present for 2 days. Patient denied prior history of EGD or colonoscopy and had never been told he has cirrhosis or varices per his report. While in the emergency department he had an episode of coffee-ground emesis and was tachycardic with rates in the 140s. He  was given fluid resuscitation. Guaiac was positive. Hemoglobin one month ago was 12;  Hgb was down to 10 in the ER.    Subjective Data  Subjective: Pt reports legs feel heavy and start to hurt by the end of our walk.  Bil legs and  scrotal edema limiting his ability to walk well.   Patient Stated Goal: to get better   Cognition  Cognition Arousal/Alertness: Awake/alert Behavior During Therapy: WFL for tasks assessed/performed Overall Cognitive Status: Within Functional Limits for tasks assessed    Balance  Static Sitting Balance Static Sitting - Balance Support: No upper extremity supported;Feet supported Static Sitting - Level of Assistance: 6: Modified independent (Device/Increase time)  End of Session PT - End of Session Activity Tolerance: Patient limited by fatigue Patient left: in bed;with call bell/phone within reach;with bed alarm set    Samatha Anspach B. Kailand Seda, PT, DPT [phone]   03/10/2013, 2:03 PM

## 2013-03-10 NOTE — Discharge Summary (Signed)
PATIENT DETAILS Name: Curtis Estrada Age: 57 y.o. Sex: male Date of Birth: 27-Feb-1956 MRN: [id number]. Admit Date: 03/02/2013 Admitting Physician: Hillary Bow, DO PCP:[facility]  Recommendations for Outpatient Follow-up:  1. 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 Hypertension   . Diabetes mellitus without complication   . HCV (hepatitis C virus)   . 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 ETOH abuse   . Ascites 01/2013  . Hematoma 01/2013    posterior right flank from a fall  . 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   . Anemia 02/2013  . Hyponatremia 01/2013    DISCHARGE MEDICATIONS:   Medication List    STOP taking these medications       acetaminophen 500 MG tablet  Commonly known as:  TYLENOL     aspirin EC 81 MG tablet     cephALEXin 500 MG capsule  Commonly known as:  KEFLEX     fish oil-omega-3 fatty acids 1000 MG capsule     gabapentin 300 MG capsule  Commonly known as:  NEURONTIN     HYDROcodone-acetaminophen 5-325 MG per tablet  Commonly known as:  NORCO/VICODIN     metFORMIN 1000 MG tablet  Commonly known as:  GLUCOPHAGE     promethazine 25 MG tablet  Commonly known as:  PHENERGAN     sildenafil 100 MG tablet  Commonly known as:  VIAGRA     sulfamethoxazole-trimethoprim 800-160 MG per tablet  Commonly known as:  SEPTRA DS      TAKE these medications       clarithromycin 500 MG tablet  Commonly known as:  BIAXIN  Take 1 tablet  (500 mg total) by mouth every 12 (twelve) hours. 500 First dose on Wed 03/09/13 for 10 days     feeding supplement Pudg  Take 1 Container by mouth 3 (three) times daily between meals.     feeding supplement Liqd  Take 237 mLs by mouth 2 (two) times daily between meals.     furosemide 40 MG tablet  Commonly known as:  LASIX  Take 1 tablet (40 mg total) by mouth 2 (two) times daily.     insulin aspart 100 UNIT/ML injection  Commonly known as:  novoLOG  Inject 3 Units into the skin 3 (three) times daily with meals.     insulin aspart 100 UNIT/ML injection  Commonly known as:  novoLOG  - 0-9 Units, Subcutaneous, 3 times daily with meals  - CBG < 70: implement hypoglycemia protocol  - CBG 70 - 120: 0 units  - CBG 121 - 150: 1 unit  - CBG 151 - 200: 2 units  - CBG 201 - 250: 3 units  - CBG 251 - 300: 5 units  - CBG 301 - 350: 7 units  - CBG 351 - 400: 9 units  - CBG > 400: call MD  insulin detemir 100 UNIT/ML injection  Commonly known as:  LEVEMIR  Inject 0.1 mLs (10 Units total) into the skin at bedtime.     lactulose 10 GM/15ML solution  Commonly known as:  CHRONULAC  Take 30 mLs (20 g total) by mouth 2 (two) times daily.     metroNIDAZOLE 500 MG tablet  Commonly known as:  FLAGYL  Take 1 tablet (500 mg total) by mouth every 12 (twelve) hours. First dose on Wed 03/09/13 For 10 days     ondansetron 8 MG tablet  Commonly known as:  ZOFRAN  Take 4 mg by mouth every 8 (eight) hours as needed for nausea.     oxyCODONE 5 MG immediate release tablet  Commonly known as:  Oxy IR/ROXICODONE  Take 1 tablet (5 mg total) by mouth every 6 (six) hours as needed.     pantoprazole 40 MG tablet  Commonly known as:  PROTONIX  Take 1 tablet (40 mg total) by mouth 2 (two) times daily before a meal.     polyvinyl alcohol 1.4 % ophthalmic solution  Commonly known as:  LIQUIFILM TEARS  Place 1 drop into both eyes daily as needed (dry eyes).     propranolol 10 MG tablet   Commonly known as:  INDERAL  Take 1 tablet (10 mg total) by mouth 3 (three) times daily.     rifaximin 550 MG Tabs tablet  Commonly known as:  XIFAXAN  Take 1 tablet (550 mg total) by mouth 2 (two) times daily at 10 AM and 5 PM.     spironolactone 100 MG tablet  Commonly known as:  ALDACTONE  Take 1 tablet (100 mg total) by mouth daily.        ALLERGIES:   Allergies  Allergen Reactions  . Penicillins     childhood    BRIEF HPI:  See H&P, Labs, Consult and Test reports for all details in brief, 57 year old male patient with known underlying alcoholism. Presented to the ER with complaints of abdominal swelling, melena, and hematemesis. He apparently quit drinking 2 weeks prior and this correlates with the onset of the symptoms except for the GI bleeding symptoms which had only been present for 2 days. Patient denied prior history of EGD or colonoscopy and had never been told he has cirrhosis or varices per his report. While in the emergency department he had an episode of coffee-ground emesis and was tachycardic with rates in the 140s. He was given fluid resuscitation. Guaiac was positive. Hemoglobin one month ago was 12; Hgb was down to 10 in the ER.    CONSULTATIONS:   GI  PERTINENT RADIOLOGIC STUDIES: Dg Chest 2 View  03/02/2013   *RADIOLOGY REPORT*  Clinical Data: Shortness of breath.  CHEST - 2 VIEW  Comparison: 08/22/2003  Findings: There is a small patchy area of infiltrate/atelectasis posteriorly at the left lung base.  Heart size and vascularity are normal.  Right lung is clear.  No significant osseous abnormality.  IMPRESSION: Small area of infiltrate/atelectasis at the left base posteriorly.   Original Report Authenticated By: Francene Boyers, M.D.   Ct Head Wo Contrast  03/04/2013   *RADIOLOGY REPORT*  Clinical Data: Change in mental status.  CT HEAD WITHOUT CONTRAST  Technique:  Contiguous axial images were obtained from the base of the skull through the vertex without  contrast.  Comparison: None.  Findings: Mild diffuse cerebral atrophy.  No ventricular dilatation.  No mass effect or midline shift.  No abnormal extra- axial fluid  collections.  The gray-white matter junctions are distinct.  The basal cisterns are not effaced.  No evidence of acute intracranial hemorrhage.  Vascular calcifications.  No depressed skull fractures.  Visualized paranasal sinuses and mastoid air cells are not opacified.  IMPRESSION: No acute intracranial abnormalities.  Mild cerebral atrophy.   Original Report Authenticated By: Burman Nieves, M.D.   US Paracentesis  03/06/2013   *RADIOLOGY REPORT*  Clinical Data: ascites, cirrhosis  ULTRASOUND GUIDED PARACENTESIS  An ultrasound guided paracentesis was thoroughly discussed with the patient and questions answered.  The benefits, risks, alternatives and complications were also discussed.  The patient understands and wishes to proceed with the procedure.  Written consent was obtained.  Ultrasound was performed to localize and mark an adequate pocket of fluid in the left lower quadrant of the abdomen.  The area was then prepped and draped in the normal sterile fashion.  1% Lidocaine was used for local anesthesia.  Under ultrasound guidance a 19 gauge Yueh catheter was introduced.  Paracentesis was performed.  The catheter was removed and a dressing applied.  Complications:  none  Findings:  A total of approximately 5.3 liters of amber colored fluid was removed.  A fluid sample was sent for laboratory analysis.  IMPRESSION: Successful ultrasound guided paracentesis yielding 5.3 liters of ascites.  Read By: Pattricia Boss PA-C   Original Report Authenticated By: D. Andria Rhein, MD   Dg Chest Port 1 View  03/03/2013   *RADIOLOGY REPORT*  Clinical Data: Hypoxia. Shortness of breath.  PORTABLE CHEST - 1 VIEW  Comparison: 03/02/2013  Findings: Cardiomegaly.  Pulmonary vascular congestion most notable centrally.  No gross pneumothorax.  Increased markings  lung bases suggestive of crowding of lung markings/atelectasis rather than infiltrate.  Mild prominence mediastinum may be related to AP magnification.  IMPRESSION: Cardiomegaly with pulmonary vascular congestion most notable centrally.   Original Report Authenticated By: Lacy Duverney, M.D.     PERTINENT LAB RESULTS: CBC:  Recent Labs  03/09/13 0430 03/10/13 0800  WBC 12.3* 10.6*  HGB 7.9* 8.0*  HCT 22.9* 23.5*  PLT 230 247   CMET CMP     Component Value Date/Time   NA 138 03/08/2013 0610   K 3.9 03/08/2013 0610   CL 109 03/08/2013 0610   CO2 22 03/08/2013 0610   GLUCOSE 158* 03/08/2013 0610   BUN 9 03/08/2013 0610   CREATININE 0.78 03/08/2013 0610   CALCIUM 8.3* 03/08/2013 0610   PROT 5.0* 03/07/2013 0700   ALBUMIN 2.5* 03/07/2013 0700   AST 65* 03/07/2013 0700   ALT 34 03/07/2013 0700   ALKPHOS 102 03/07/2013 0700   BILITOT 1.4* 03/07/2013 0700   GFRNONAA >90 03/08/2013 0610   GFRAA >90 03/08/2013 0610    GFR Estimated Creatinine Clearance: 106.3 ml/min (by C-G formula based on Cr of 0.78). No results found for this basename: LIPASE, AMYLASE,  in the last 72 hours No results found for this basename: CKTOTAL, CKMB, CKMBINDEX, TROPONINI,  in the last 72 hours No components found with this basename: POCBNP,  No results found for this basename: DDIMER,  in the last 72 hours No results found for this basename: HGBA1C,  in the last 72 hours No results found for this basename: CHOL, HDL, LDLCALC, TRIG, CHOLHDL, LDLDIRECT,  in the last 72 hours No results found for this basename: TSH, T4TOTAL, FREET3, T3FREE, THYROIDAB,  in the last 72 hours No results found for this basename: VITAMINB12, FOLATE, FERRITIN, TIBC, IRON, RETICCTPCT,  in the last 72 hours  Coags: No results found for this basename: PT, INR,  in the last 72 hours Microbiology: Recent Results (from the past 240 hour(s))  MRSA PCR SCREENING     Status: None   Collection Time    03/03/13  1:09 AM      Result Value Range  Status   MRSA by PCR NEGATIVE  NEGATIVE Final   Comment:            The GeneXpert MRSA Assay (FDA     approved for NASAL specimens     only), is one component of a     comprehensive MRSA colonization     surveillance program. It is not     intended to diagnose MRSA     infection nor to guide or     monitor treatment for     MRSA infections.  BODY FLUID CULTURE     Status: None   Collection Time    03/06/13 10:29 AM      Result Value Range Status   Specimen Description ASCITIC FLUID   Final   Special Requests Normal   Final   Gram Stain     Final   Value: RARE WBC PRESENT, PREDOMINANTLY PMN     NO ORGANISMS SEEN     Performed at [facility]   Culture     Final   Value: NO GROWTH 3 DAYS     Performed at [facility]   Report Status PENDING   Incomplete     BRIEF HOSPITAL COURSE:  Encephalopathy - Hepatic +/- EtOH withdrawal  Patient was admitted, started on lactulose and rifaximin, with his mental status back to baseline. Continue lactulose and rifaximin on discharge. Last ammonia stable at 30.  Hematemesis / UGIB due to ulcer Patient was admitted with hematemesis, and GI was consulted-EGD: non bleeding varices, 2 x2 cm ulcer at incisura - completed IV PPI gtt for total of 72 hours >> BID PPI X 12 weeks on discharge. She will need followup with GI clinic.  Acute blood loss anemia Patient did not require PRBC transfusion this admission, discharge hemoglobin 8.0. Need to monitor H&H closely as an outpatient.  H pylori +  GI initiated tx regimen (clindamycin and Flagyl for 10 days from 8/27)   Cirrhosis, alcoholic and Hepatitis C - 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 with large volume ascites  pt denied being told has cirrhosis - counseled on ETOH cessation - overall prognosis is poor - underwent large volume paracentesis 8/24 - tolerating well thus far - continue diuretics and BB Needs followup at GI clinic on discharge Please closely monitor electrolytes on a  diuretic Ascites fluid culture and cytology was both negative   Ascites - Secondary to above - Abdomen distended, but not tense. He has no shortness of breath. Currently will continue with Aldactone and Lasix, if however he worsens and his abdomen becomes tense, he may need a paracentesis in the future  Acute respiratory failure with hypoxia  Resolved post paracentesis - most c/w abdom compartment interference   Diabetes - Given advanced cirrhosis, will stop metformin, currently sugar stable with blood and urine SSI. This will be continued on discharge.  Coagulopathy - Secondary to liver cirrhosis. Monitor INR periodically.  Physical deconditioning  -PT/OT recommend SNF - d/w pt and family/agree    TODAY-DAY OF DISCHARGE:  Subjective:   [name] today has no headache,no chest abdominal pain,no new weakness tingling or numbness.  Objective:   Blood pressure 116/77, pulse 93, temperature 99.3  F (37.4 C), temperature source Oral, resp. rate 20, height 5\' 7"  (1.702 m), weight 89.9 kg (198 lb 3.1 oz), SpO2 99.00%.  Intake/Output Summary (Last 24 hours) at 03/10/13 1112 Last data filed at 03/10/13 1105  Gross per 24 hour  Intake      3 ml  Output    700 ml  Net   -697 ml   Filed Weights   03/09/13 0400 03/09/13 1551 03/10/13 0543  Weight: 88.4 kg (194 lb 14.2 oz) 89.4 kg (197 lb 1.5 oz) 89.9 kg (198 lb 3.1 oz)    Exam Awake Alert, Oriented *3, No new F.N deficits, Normal affect [place].AT,PERRAL Supple Neck,No JVD, No cervical lymphadenopathy appriciated.  Symmetrical Chest wall movement, Good air movement bilaterally, CTAB RRR,No Gallops,Rubs or new Murmurs, No Parasternal Heave +ve B.Sounds, Abd Soft, Non tender, No organomegaly appriciated, No rebound -guarding or rigidity. Abdomen is distended, but not tense No Cyanosis, Clubbing or edema, No new Rash or bruise  DISCHARGE CONDITION: Stable  DISPOSITION: SNF  DISCHARGE INSTRUCTIONS:    Activity:  As tolerated  with Full fall precautions use walker/cane & assistance as needed  Diet recommendation: Diabetic Diet Hepatic diet      Discharge Orders   Future Orders Complete By Expires   Call MD for:  persistant nausea and vomiting  As directed    Diet - low sodium heart healthy  As directed    Scheduling Instructions:     Low protein diet/hepatic diet   Increase activity slowly  As directed       Follow-up Information   Follow up with [facility]. Schedule an appointment as soon as possible for a visit in 2 weeks.   Specialty:  General Practice   Contact information:   [address] [place] [address] [phone]       Follow up with Beverley Fiedler, MD. Schedule an appointment as soon as possible for a visit in 2 weeks.   Specialty:  Gastroenterology   Contact information:   [address] [place] [address] [phone]       Total Time spent on discharge equals 45 minutes.  SignedJeoffrey Massed 03/10/2013 11:12 AM

## 2013-03-10 NOTE — Progress Notes (Signed)
Patient has been having a difficult time urinating since his foley cath came out. He had a bladder scan with the day RN and it showed that his bladder was full. Patient says it does not feel full to him. He has urinated 200cc as of 2100. Will continue to monitor

## 2013-03-10 NOTE — Evaluation (Signed)
Occupational Therapy Evaluation Patient Details Name: Curtis Estrada MRN: [id number] DOB: 1956/03/20 Today's Date: 03/10/2013 Time: 1308-6578 OT Time Calculation (min): 31 min  OT Assessment / Plan / Recommendation History of present illness 57 year old male patient with known underlying alcoholism. Presented to the ER with complaints of abdominal swelling, melena, and hematemesis. He apparently quit drinking 2 weeks prior and this correlates with the onset of the symptoms except for the GI bleeding symptoms which had only been present for 2 days. Patient denied prior history of EGD or colonoscopy and had never been told he has cirrhosis or varices per his report. While in the emergency department he had an episode of coffee-ground emesis and was tachycardic with rates in the 140s. He was given fluid resuscitation. Guaiac was positive. Hemoglobin one month ago was 12;  Hgb was down to 10 in the ER.   Clinical Impression   Pt admitted with above. Pt currently with functional limitations due to the deficits listed below (see OT Problem List).  Pt will benefit from skilled OT to increase their safety and independence with ADL and functional mobility for ADL to facilitate discharge to venue listed below.       OT Assessment  Patient needs continued OT Services    Follow Up Recommendations  SNF    Barriers to Discharge Decreased caregiver support    Equipment Recommendations  None recommended by OT       Frequency  Min 2X/week    Precautions / Restrictions Precautions Precautions: Fall Precaution Comments: scrotal edema Restrictions Weight Bearing Restrictions: No   Pertinent Vitals/Pain C/O scrotal pain due to edema--not rated    ADL  Eating/Feeding: Independent Where Assessed - Eating/Feeding: Edge of bed Grooming: Set up;Supervision/safety Where Assessed - Grooming: Unsupported standing Upper Body Bathing: Set up Where Assessed - Upper Body Bathing: Unsupported sitting Lower  Body Bathing: Moderate assistance Where Assessed - Lower Body Bathing: Unsupported sit to stand Upper Body Dressing: Set up Where Assessed - Upper Body Dressing: Unsupported sitting Lower Body Dressing: +1 Total assistance Where Assessed - Lower Body Dressing: Unsupported sit to stand Toilet Transfer: Min Pension scheme manager Method: Sit to Barista: Regular height toilet;Grab bars Toileting - Clothing Manipulation and Hygiene: Supervision/safety Where Assessed - Engineer, mining and Hygiene: Sit to stand from 3-in-1 or toilet Equipment Used: Rolling walker Transfers/Ambulation Related to ADLs: min guard A ADL Comments: Pt cannot currently do much of his LBB/D due to scrotal edema    OT Diagnosis: Generalized weakness;Acute pain  OT Problem List: Decreased strength;Impaired balance (sitting and/or standing);Decreased knowledge of use of DME or AE;Pain OT Treatment Interventions: Self-care/ADL training;Therapeutic activities;Patient/family education;DME and/or AE instruction;Balance training   OT Goals(Current goals can be found in the care plan section) Acute Rehab OT Goals Patient Stated Goal: to get better OT Goal Formulation: With patient Time For Goal Achievement: 03/24/13 Potential to Achieve Goals: Good  Visit Information  Last OT Received On: 03/10/13 Assistance Needed: +1 History of Present Illness: 57 year old male patient with known underlying alcoholism. Presented to the ER with complaints of abdominal swelling, melena, and hematemesis. He apparently quit drinking 2 weeks prior and this correlates with the onset of the symptoms except for the GI bleeding symptoms which had only been present for 2 days. Patient denied prior history of EGD or colonoscopy and had never been told he has cirrhosis or varices per his report. While in the emergency department he had an episode of coffee-ground emesis and was tachycardic with  rates in the 140s.  He was given fluid resuscitation. Guaiac was positive. Hemoglobin one month ago was 12;  Hgb was down to 10 in the ER.       Prior Functioning     Home Living Family/patient expects to be discharged to:: Skilled nursing facility Living Arrangements: Alone Additional Comments: pt lived alone and was indep PTA Prior Function Level of Independence: Independent Communication Communication: No difficulties Dominant Hand: Right         Vision/Perception Vision - History Baseline Vision: Wears glasses for distance only Patient Visual Report: No change from baseline   Cognition  Cognition Arousal/Alertness: Awake/alert Behavior During Therapy: WFL for tasks assessed/performed Overall Cognitive Status: Within Functional Limits for tasks assessed    Extremity/Trunk Assessment Upper Extremity Assessment Upper Extremity Assessment: Overall WFL for tasks assessed     Mobility Bed Mobility Bed Mobility: Sit to Supine;Sit to Sidelying Right Supine to Sit: 6: Modified independent (Device/Increase time);With rails;HOB elevated Sitting - Scoot to Edge of Bed: 6: Modified independent (Device/Increase time);With rail Sit to Supine: 3: Mod assist Details for Bed Mobility Assistance: assist needed assist to get bil legs back into bed due to heaviness from edema.   Transfers Transfers: Sit to Stand;Stand to Sit Sit to Stand: 5: Supervision;With upper extremity assist;From bed Stand to Sit: 5: Supervision;With upper extremity assist;To chair/3-in-1 Details for Transfer Assistance: supervision for safety due to slow speed of movement.          Balance Static Sitting Balance Static Sitting - Balance Support: No upper extremity supported;Feet supported Static Sitting - Level of Assistance: 6: Modified independent (Device/Increase time)   End of Session OT - End of Session Equipment Utilized During Treatment: Rolling walker Activity Tolerance: Patient tolerated treatment well Patient  left: in chair;with call bell/phone within reach;with chair alarm set       Evette Georges [phone] 03/10/2013, 3:05 PM

## 2013-03-11 ENCOUNTER — Other Ambulatory Visit: Payer: Self-pay | Admitting: *Deleted

## 2013-03-11 ENCOUNTER — Non-Acute Institutional Stay (SKILLED_NURSING_FACILITY): Payer: PRIVATE HEALTH INSURANCE | Admitting: Internal Medicine

## 2013-03-11 DIAGNOSIS — B9681 Helicobacter pylori [H. pylori] as the cause of diseases classified elsewhere: Secondary | ICD-10-CM

## 2013-03-11 DIAGNOSIS — K259 Gastric ulcer, unspecified as acute or chronic, without hemorrhage or perforation: Secondary | ICD-10-CM

## 2013-03-11 DIAGNOSIS — K922 Gastrointestinal hemorrhage, unspecified: Secondary | ICD-10-CM

## 2013-03-11 DIAGNOSIS — I8510 Secondary esophageal varices without bleeding: Secondary | ICD-10-CM

## 2013-03-11 DIAGNOSIS — K7460 Unspecified cirrhosis of liver: Secondary | ICD-10-CM

## 2013-03-11 DIAGNOSIS — A048 Other specified bacterial intestinal infections: Secondary | ICD-10-CM

## 2013-03-11 DIAGNOSIS — K766 Portal hypertension: Secondary | ICD-10-CM

## 2013-03-11 DIAGNOSIS — I8500 Esophageal varices without bleeding: Secondary | ICD-10-CM

## 2013-03-11 LAB — GLUCOSE, CAPILLARY: Glucose-Capillary: 128 mg/dL — ABNORMAL HIGH (ref 70–99)

## 2013-03-11 MED ORDER — OXYCODONE HCL 5 MG PO TABS: 5.0000 mg | ORAL_TABLET | Freq: Four times a day (QID) | ORAL | Status: DC | PRN

## 2013-03-11 NOTE — Progress Notes (Addendum)
Tried to call report to RN at [facility] at [place] and [phone] but RN unavailable to take report at those times.  Left number to call me back.

## 2013-03-11 NOTE — Progress Notes (Signed)
Patient ID: Curtis Estrada, male   DOB: [birth year], 57 y.o.   MRN: [id number] Facility; [facility] Chief complaint mentioned to SNF to stay at [facility] 03/02/2013 03/10/2013 History this is a 57 year old man who lives on his own. He presented to the emergency room with increasing abdominal swelling, melena and hematemesis. He apparently quit drinking 2 weeks ago. Prior to this he tells me he drank 40 ounces of alcohol a day. He underwent a paracentesis on August 24 tolerated this well. Culture the ascitic fluid was negative. He also had a CT scan of the abdomen and pelvis that revealed revealed upper abdominal varices. GI saw the patient an EGD showed nonbleeding varices. There was a 2 x 2 centimeter ulcer at the incisura. He was placed on PPIs. He was also found to be H. pylori positive and was treated with clarithromycin and Flagyl for 10 days from 8/27. The patient was not transfused his discharge hemoglobin was 8. The patient denied been told he had cirrhosis to the hospital as although he tells me today that he had jaundice 3 years ago and was told he had cirrhosis at that time at the [place]. He also has a history of hepatitis C. Of note he appeared to have autoanticoagulation.  CT ABDOMEN AND PELVIS WITH CONTRAST   Technique:  Multidetector CT imaging of the abdomen and pelvis was performed following the standard protocol during bolus administration of intravenous contrast.   Contrast: OMNIPAQUE IOHEXOL 300 MG/ML  SOLN   Comparison: 08/10/2012   Findings: There is a linear atelectasis or fibrosis in the lung bases.   Changes of hepatic cirrhosis with enlarged lateral segment left lobe and caudate lobe.  Nodular contour of the liver.  Gallbladder wall is thickened which may be response to ascites and cirrhosis. Spleen size is normal.  Flow is demonstrated in the portal veins. There is interval developmentMedications of upper abdoclarithromycin 500 Patelminal fluid  collections. Density measurements are consistent with ascites.  Fluid extends throughout the abdomen and into the pelvis.  The pancreas, kidneys, inferior vena cava, and retroperitoneal lymph nodes unremarkable.  Calcification of the abdominal aorta without aneurysm.  There is a right adrenal gland nodule measuring 1.5 cm diameter.  This is stable since previous study and probably represents an adenoma.  Mildly prominent lymph nodes in the celiac axis likely related to reactive change due to of cirrhosis. Gastric and splenic varices.  The stomach and small bowel are decompressed.  Stool filled colon without abnormal distension.  No free air in the abdomen.  Abdominal wall musculature appears intact.   Pelvis:  Pelvic ascites as previously indicated.  Fat containing left inguinal hernia appears stable.  Bladder wall is not thickened.  Prostate gland is not enlarged.  No significant pelvic lymphadenopathy.  The appendix appears normal.  Degenerative changes in the lumbar spine with normal alignment. Subcutaneous soft tissue hematoma in the right flank region inferior to the right 12th rib.  This measures about 4.2 x 8.6 by 3.1 cm.  No displaced lumbar spine, pelvic, or rib fractures are identified.   IMPRESSION: Hepatic cirrhosis with developing diffuse abdominal and pelvic ascites.  Gallbladder wall thickening is probably reactive. Reactive lymph nodes in the celiac axis.  The upper abdominal varices.  Subcutaneous soft tissue hematoma in the right posterior flank region.  No displaced fractures identified.      Gram Stain RARE WBC PRESENT, PREDOMINANTLY PMN NO ORGANISMS SEEN Performed at [facility]    Culture NO  GROWTH 3 DAYS Performed at [facility]    Report Status 03/10/2013 FINAL     Resulting Agency [facility]  Specimen Collected: 03/06/13 10:29 AM Last Resulted   Past Medical History  Diagnosis Date  . Hypertension   . Diabetes mellitus without  complication   . HCV (hepatitis C virus)   . 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 ETOH abuse   . Ascites 01/2013  . Hematoma 01/2013    posterior right flank from a fall  . 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   . Anemia 02/2013  . Hyponatremia 01/2013   Past Surgical History  Procedure Laterality Date  . Hernia repair    . Tonsillectomy    . Esophagogastroduodenoscopy N/A 03/03/2013    Procedure: ESOPHAGOGASTRODUODENOSCOPY (EGD);  Surgeon: Beverley Fiedler, MD;  Location: [facility] ENDOSCOPY;  Service: Gastroenterology;  Laterality: N/A;  Bedside   Medications;  Clarithromycin 500 q12, lasix 40 bid,insulin aspart ss, Lactulose 20gbid, flagyl 500 bid x 10 days, propranolol 10 tid,protonix 40 bid,rifaxamin 550 bid,aldactone 100 mg qd   reports that he has been smoking Cigarettes.  He has a 11.25 pack-year smoking history. He has never used smokeless tobacco. He reports that  drinks alcohol. He reports that he does not use illicit drugs. He lives at [facility] which is subsidized housing. States he is independent.   Review of systems; Respitory: Cough no sputum no shortness of breath Cardiac no exertional chest pain Abdomen occasional right-sided flank pain no nausea or vomiting. States his appetite is good no dysphagia no change in bowel habits GU no dysuria  General; No acute distress Vitals; 64 RR18 O2 sat 94^% on Room air. HEENT; no oral lesions Respiratory; clear air entry bilaterally, no wheezing, work of breathing is normal Cardiac; S1normal,S2normal,no murmers, no gallops,no bruits. No evidence of CHF GI; marked abdominal distention with obvious shifting dullness. His liver is not palpable no spleen no tenderness. There is no scleral icterus. No stigmata. No asterixis GU; No bladder distention, no CVA tenderness MSK; no active synovitis Skin; no lesions, no pressure areas Neurologic: CN's 2-12 normal, Motor; no weakness, Reflexes absent. Extremity strength seems  intact Gait; normal Mental Status; No overt Cognitive Difficulties, No depression. No current signs of encephalopathy  Impression/plan #1 hepatic cirrhosis with portal hypertension, at encephalopathy. All of this seems to be reasonably well-controlled. He'll need to see if we can diuresis and. Will follow his weights and lab work #2 upper GI bleed secondary to gastric ulcer. We'll follow his hemoglobin. He was not transfused in the hospital. He was treated for H. pylori and he will complete that treatment here. #3 massive lower extremity edema and ascites. Again we will attempt to gradually diurese him. #4 post paracentesis he appears to have tolerated this well mentioned above fluid culture was negative #5 type 2 diabetes he was taken off his metformin only currently following sliding scale  This patient lives in subsidized housing and is obviously of limited means. Will see houw he does in  rehabilitation. Not sure about social support

## 2013-03-11 NOTE — Progress Notes (Signed)
Gave report to [place], Charity fundraiser at [employer]. Left number to call with any additional questions.

## 2013-03-11 NOTE — Progress Notes (Addendum)
PATIENT DETAILS Name: Curtis Estrada Age: 57 y.o. Sex: male Date of Birth: 09-22-1955 Admit Date: 03/02/2013 Admitting Physician Hillary Bow, DO PCP:[facility]  Subjective: No major issues overnight-claims to have "pee'd up a storm"-claims he has been multiple times now. Foley catheter was then we'll yesterday afternoon   Assessment/Plan: Encephalopathy - Hepatic +/- EtOH withdrawal  lactulose dosing continues - is awake and alert, mental status is at baseline - cont lactulose + rifaximin  on discharge  Hematemesis / UGIB due to ulcer  CT of the abdomen and pelvis in July revealed upper abdominal varices - GI consulted - EGD: non bleeding varices, 2 x2 cm ulcer at incisura - completed IV PPI gtt for total of 72 hours >> BID PPI X 12 weeks on discharge  H pylori +  GI initiated tx regimen (clarithromycin and Flagyl for 10 days from 8/27)   Acute blood loss anemia  Patient did not require PRBC transfusion this admission, discharge hemoglobin 8.0. Need to monitor H&H closely as an outpatient.  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 He has no shortness of breath. Currently will continue with Aldactone and Lasix, if however he worsens and his abdomen becomes tense, he may need a paracentesis in the future   Acute respiratory failure with hypoxia  much improved post paracentesis - most c/w abdom compartment interference   Lower extremity and scrotal/penile edema  - Foley catheter removed on 8/28, claims to have unit at multiple times  over the night and this morning. -titrate diuretics as noted above   Coagulopathy  - Secondary to liver cirrhosis. Monitor INR periodically.  Chronic wound of extremity-right great toe/Decreased pedal pulses  has been on anbx's previously - toe not draining at present - note diminished pulses - arterial duplex with abnormal right pedal waveforms but normal ABI's   Physical deconditioning  -PT/OT recommend SNF -will be transferred to SNF today  Code Status: Full code   Family Communication  none at bedside  CONSULTS:  GI   MEDICATIONS: Scheduled Meds: . clarithromycin  500 mg Oral Q12H  . feeding supplement  237 mL Oral BID BM  . feeding supplement  1 Container Oral TID BM  . furosemide  40 mg Oral BID  . insulin aspart  0-5 Units Subcutaneous QHS  . insulin aspart  0-9 Units Subcutaneous TID WC  . insulin aspart  3 Units Subcutaneous TID WC  . insulin detemir  10 Units Subcutaneous QHS  . lactulose  20 g Oral BID  . metroNIDAZOLE  500 mg Oral Q12H  . pantoprazole  40 mg Oral BID AC  . propranolol  10 mg Oral TID  . rifaximin  550 mg Oral BID  . sodium chloride  3 mL Intravenous Q12H  . spironolactone  100 mg Oral Daily   Continuous Infusions:  PRN Meds:.acetaminophen, morphine injection, ondansetron, oxyCODONE, polyvinyl alcohol  Antibiotics: Anti-infectives   Start     Dose/Rate Route Frequency Ordered Stop   03/10/13 0000  rifaximin (XIFAXAN) 550 MG TABS tablet  550 mg Oral 2 times daily 03/10/13 1111     03/10/13 0000  metroNIDAZOLE (FLAGYL) 500 MG tablet     500 mg Oral Every 12 hours 03/10/13 1111     03/10/13 0000  clarithromycin (BIAXIN) 500 MG tablet     500 mg Oral Every 12 hours 03/10/13 1111     03/09/13 1000  clarithromycin (BIAXIN) tablet 500 mg     500 mg Oral Every 12 hours 03/08/13 1819 03/19/13 0959   03/09/13 1000  metroNIDAZOLE (FLAGYL) tablet 500 mg     500 mg Oral Every 12 hours 03/08/13 1819 03/19/13 0959   03/05/13 1130  rifaximin  (XIFAXAN) tablet 550 mg     550 mg Oral 2 times daily 03/05/13 1056     03/03/13 1000  ceFEPIme (MAXIPIME) 1 g in dextrose 5 % 50 mL IVPB     1 g 100 mL/hr over 30 Minutes Intravenous Every 12 hours 03/03/13 0854 03/07/13 2256       PHYSICAL EXAM: Vital signs in last 24 hours: Filed Vitals:   03/10/13 1104 03/10/13 1500 03/10/13 2126 03/11/13 0433  BP: 116/77 120/74 103/70 96/61  Pulse: 93 87 97 90  Temp:  98.6 F (37 C) 98.6 F (37 C) 98.8 F (37.1 C)  TempSrc:  Oral Oral Oral  Resp:  20 20 18   Height:      Weight:    86.8 kg (191 lb 5.8 oz)  SpO2:  96% 97% 96%    Weight change: -2.6 kg (-5 lb 11.7 oz) Filed Weights   03/09/13 1551 03/10/13 0543 03/11/13 0433  Weight: 89.4 kg (197 lb 1.5 oz) 89.9 kg (198 lb 3.1 oz) 86.8 kg (191 lb 5.8 oz)   Body mass index is 29.96 kg/(m^2).   Gen Exam: Awake and alert with clear speech.   Neck: Supple, No JVD.   Chest: B/L Clear.   CVS: S1 S2 Regular, no murmurs.  Abdomen: soft, BS +, non tender,  Distended but not tense.  Extremities: no edema, lower extremities warm to touch. Neurologic: Non Focal.   Skin: No Rash.   Wounds: N/A.    Intake/Output from previous day:  Intake/Output Summary (Last 24 hours) at 03/11/13 1001 Last data filed at 03/11/13 0912  Gross per 24 hour  Intake    363 ml  Output    525 ml  Net   -162 ml     LAB RESULTS: CBC  Recent Labs Lab 03/07/13 0700 03/07/13 1400 03/08/13 0610 03/09/13 0430 03/10/13 0800  WBC 10.9* 12.0* 13.2* 12.3* 10.6*  HGB 7.1* 7.5* 7.7* 7.9* 8.0*  HCT 20.6* 21.4* 21.5* 22.9* 23.5*  PLT 176 190 206 230 247  MCV 82.7 80.8 79.6 80.9 79.9  MCH 28.5 28.3 28.5 27.9 27.2  MCHC 34.5 35.0 35.8 34.5 34.0  RDW 17.0* 16.3* 16.3* 16.3* 15.9*    Chemistries   Recent Labs Lab 03/05/13 0600 03/06/13 0500 03/07/13 0700 03/08/13 0610  NA 145 143 135 138  K 3.6 3.7 3.3* 3.9  CL 115* 114* 106 109  CO2 24 25 20 22   GLUCOSE 163* 133* 246* 158*  BUN 18 15 12 9    CREATININE 0.96 1.00 0.79 0.78  CALCIUM 8.4 8.5 8.1* 8.3*    CBG:  Recent Labs Lab 03/10/13 0739 03/10/13 1237 03/10/13 1659 03/10/13 2130 03/11/13 0809  GLUCAP 160* 196* 286* 264* 128*    GFR Estimated Creatinine Clearance: 104.6 ml/min (by C-G formula based on Cr of 0.78).  Coagulation profile  Recent Labs Lab 03/05/13 0600 03/06/13 0500  INR 1.62* 1.54*    Cardiac Enzymes No results found for this basename: CK, CKMB, TROPONINI, MYOGLOBIN,  in the last 168 hours  No components found with this basename: POCBNP,  No results found for this basename: DDIMER,  in the last 72 hours No results found for this basename: HGBA1C,  in the last 72 hours No results found for this basename: CHOL, HDL, LDLCALC, TRIG, CHOLHDL, LDLDIRECT,  in the last 72 hours No results found for this basename: TSH, T4TOTAL, FREET3, T3FREE, THYROIDAB,  in the last 72 hours No results found for this basename: VITAMINB12, FOLATE, FERRITIN, TIBC, IRON, RETICCTPCT,  in the last 72 hours No results found for this basename: LIPASE, AMYLASE,  in the last 72 hours  Urine Studies No results found for this basename: UACOL, UAPR, USPG, UPH, UTP, UGL, UKET, UBIL, UHGB, UNIT, UROB, ULEU, UEPI, UWBC, URBC, UBAC, CAST, CRYS, UCOM, BILUA,  in the last 72 hours  MICROBIOLOGY: Recent Results (from the past 240 hour(s))  MRSA PCR SCREENING     Status: None   Collection Time    03/03/13  1:09 AM      Result Value Range Status   MRSA by PCR NEGATIVE  NEGATIVE Final   Comment:            The GeneXpert MRSA Assay (FDA     approved for NASAL specimens     only), is one component of a     comprehensive MRSA colonization     surveillance program. It is not     intended to diagnose MRSA     infection nor to guide or     monitor treatment for     MRSA infections.  BODY FLUID CULTURE     Status: None   Collection Time    03/06/13 10:29 AM      Result Value Range Status   Specimen Description ASCITIC FLUID    Final   Special Requests Normal   Final   Gram Stain     Final   Value: RARE WBC PRESENT, PREDOMINANTLY PMN     NO ORGANISMS SEEN     Performed at [facility]   Culture     Final   Value: NO GROWTH 3 DAYS     Performed at [facility]   Report Status 03/10/2013 FINAL   Final    RADIOLOGY STUDIES/RESULTS: Dg Chest 2 View  03/02/2013   *RADIOLOGY REPORT*  Clinical Data: Shortness of breath.  CHEST - 2 VIEW  Comparison: 08/22/2003  Findings: There is a small patchy area of infiltrate/atelectasis posteriorly at the left lung base.  Heart size and vascularity are normal.  Right lung is clear.  No significant osseous abnormality.  IMPRESSION: Small area of infiltrate/atelectasis at the left base posteriorly.   Original Report Authenticated By: Francene Boyers, M.D.   Ct Head Wo Contrast  03/04/2013   *RADIOLOGY REPORT*  Clinical Data: Change in mental status.  CT HEAD WITHOUT CONTRAST  Technique:  Contiguous axial images were obtained from the base of the skull through the vertex without contrast.  Comparison: None.  Findings: Mild diffuse cerebral atrophy.  No ventricular dilatation.  No mass effect or midline shift.  No abnormal extra- axial fluid collections.  The gray-white matter junctions are distinct.  The basal cisterns are not effaced.  No evidence of acute intracranial hemorrhage.  Vascular calcifications.  No depressed skull fractures.  Visualized paranasal sinuses and mastoid  air cells are not opacified.  IMPRESSION: No acute intracranial abnormalities.  Mild cerebral atrophy.   Original Report Authenticated By: Burman Nieves, M.D.   US Paracentesis  03/06/2013   *RADIOLOGY REPORT*  Clinical Data: ascites, cirrhosis  ULTRASOUND GUIDED PARACENTESIS  An ultrasound guided paracentesis was thoroughly discussed with the patient and questions answered.  The benefits, risks, alternatives and complications were also discussed.  The patient understands and wishes to proceed with the  procedure.  Written consent was obtained.  Ultrasound was performed to localize and mark an adequate pocket of fluid in the left lower quadrant of the abdomen.  The area was then prepped and draped in the normal sterile fashion.  1% Lidocaine was used for local anesthesia.  Under ultrasound guidance a 19 gauge Yueh catheter was introduced.  Paracentesis was performed.  The catheter was removed and a dressing applied.  Complications:  none  Findings:  A total of approximately 5.3 liters of amber colored fluid was removed.  A fluid sample was sent for laboratory analysis.  IMPRESSION: Successful ultrasound guided paracentesis yielding 5.3 liters of ascites.  Read By: Pattricia Boss PA-C   Original Report Authenticated By: D. Andria Rhein, MD   Dg Chest Port 1 View  03/03/2013   *RADIOLOGY REPORT*  Clinical Data: Hypoxia. Shortness of breath.  PORTABLE CHEST - 1 VIEW  Comparison: 03/02/2013  Findings: Cardiomegaly.  Pulmonary vascular congestion most notable centrally.  No gross pneumothorax.  Increased markings lung bases suggestive of crowding of lung markings/atelectasis rather than infiltrate.  Mild prominence mediastinum may be related to AP magnification.  IMPRESSION: Cardiomegaly with pulmonary vascular congestion most notable centrally.   Original Report Authenticated By: Lacy Duverney, M.D.    Jeoffrey Massed, MD  [facility] Pager:[phone]  If 7PM-7AM, please contact night-coverage [email or web address] Password [email or web address] 03/11/2013, 10:01 AM   LOS: 9 days

## 2013-03-16 ENCOUNTER — Telehealth: Payer: Self-pay | Admitting: *Deleted

## 2013-03-16 NOTE — Telephone Encounter (Signed)
Called the nurses's station for pt at [facility], [phone], for possible LOS for pt. Nurse is to call me back.

## 2013-03-16 NOTE — Telephone Encounter (Signed)
Message copied by Florene Glen on Wed Mar 16, 2013 10:28 AM ------      Message from: Beverley Fiedler      Created: Wed Mar 16, 2013  9:35 AM       GI follow-up is appropriate.  I am not sure if this will be feasible.  I am okay with sending him a letter to contact us for GI and Liver office visit follow-up when he is d/c'ed from SNF.      JMP            ----- Message -----         From: Linna Hoff, RN         Sent: 03/16/2013   8:45 AM           To: Beverley Fiedler, MD            Dr Rhea Belton, are you receiving total responsibility of this man? You wrote on EGD note, he need repeat EGD in 8 weeks. Pt was sent to [facility]; had paracentesis, has cirrhosis with portal HTN and encephalopathy. Thanks.       ------

## 2013-03-16 NOTE — Telephone Encounter (Signed)
Spoke with Somata at [facility] and she doesn't know yet if pt if there for just Rehab and or placement; I will call her next week.

## 2013-04-04 ENCOUNTER — Non-Acute Institutional Stay (SKILLED_NURSING_FACILITY): Payer: PRIVATE HEALTH INSURANCE | Admitting: Internal Medicine

## 2013-04-04 DIAGNOSIS — I10 Essential (primary) hypertension: Secondary | ICD-10-CM

## 2013-04-04 DIAGNOSIS — K5900 Constipation, unspecified: Secondary | ICD-10-CM

## 2013-04-04 DIAGNOSIS — K7030 Alcoholic cirrhosis of liver without ascites: Secondary | ICD-10-CM

## 2013-04-04 DIAGNOSIS — E1165 Type 2 diabetes mellitus with hyperglycemia: Secondary | ICD-10-CM | POA: Insufficient documentation

## 2013-04-04 DIAGNOSIS — E119 Type 2 diabetes mellitus without complications: Secondary | ICD-10-CM

## 2013-04-04 HISTORY — DX: Constipation, unspecified: K59.00

## 2013-04-04 NOTE — Progress Notes (Signed)
PROGRESS NOTE  DATE: 04/04/2013  FACILITY: Nursing Home Location: [facility]  LEVEL OF CARE: SNF (31)  Routine Visit  CHIEF COMPLAINT:  Manage constipation, diabetes mellitus and hypertension  HISTORY OF PRESENT ILLNESS:  REASSESSMENT OF ONGOING PROBLEM(S):  CONSTIPATION: The constipation is unstable. No complications from the medications presently being used. Patient c/o ongoing constipation, but denies abdominal pain, nausea or vomiting.  DM:pt's DM remains stable.  Pt denies polyuria, polydipsia, polyphagia, changes in vision or hypoglycemic episodes.  No complications noted from the medication presently being used.  Last hemoglobin A1c not available.  HTN: Pt 's HTN remains stable.  Denies CP, sob, DOE, pedal edema, headaches, dizziness or visual disturbances.  No complications from the medications currently being used.  Last BP : Not recorded  PAST MEDICAL HISTORY : Reviewed.  No changes.  CURRENT MEDICATIONS: Reviewed per [facility]  REVIEW OF SYSTEMS:  GENERAL: no change in appetite, no fatigue, no weight changes, no fever, chills or weakness RESPIRATORY: no cough, SOB, DOE, wheezing, hemoptysis CARDIAC: no chest pain, edema or palpitations GI: no abdominal pain, diarrhea, heart burn, nausea or vomiting. Complains of constipation  PHYSICAL EXAMINATION  VS:  Not recorded  GENERAL: no acute distress, normal body habitus EYES: conjunctivae normal, sclerae normal, normal eye lids NECK: supple, trachea midline, no neck masses, no thyroid tenderness, no thyromegaly LYMPHATICS: no LAN in the neck, no supraclavicular LAN RESPIRATORY: breathing is even & unlabored, BS CTAB CARDIAC: RRR, no murmur,no extra heart sounds, no edema GI: abdomen soft, normal BS, no masses, no tenderness, no hepatomegaly, no splenomegaly PSYCHIATRIC: the patient is alert & oriented to person, affect & behavior appropriate  LABS/RADIOLOGY:  9-14 hemoglobin 8.6, MCV 81 otherwise  CBC normal, albumin 2.9, total bilirubin 1.4, alkaline phosphatase 153, AST 61 otherwise CMP normal  ASSESSMENT/PLAN:  Constipation-uncontrolled. Start MiraLax 17 g daily Hypertension-request blood pressure charting. Diabetes mellitus-check hemoglobin A1c cirrhosis-compensated Hepatitis C-no symptoms. Gastric ulcer the H. pylori infection-completed clarithromycin. Continue PPI. Portal hypertension and esophageal varices-on inderal.  CPT CODE: 16109

## 2013-04-11 ENCOUNTER — Non-Acute Institutional Stay (SKILLED_NURSING_FACILITY): Payer: PRIVATE HEALTH INSURANCE | Admitting: Internal Medicine

## 2013-04-11 DIAGNOSIS — K7030 Alcoholic cirrhosis of liver without ascites: Secondary | ICD-10-CM

## 2013-04-11 DIAGNOSIS — E119 Type 2 diabetes mellitus without complications: Secondary | ICD-10-CM

## 2013-04-14 NOTE — Progress Notes (Signed)
Patient ID: Curtis Estrada, male   DOB: 01/08/1956, 57 y.o.   MRN: [id number]           PROGRESS NOTE  DATE:  04/08/2013  FACILITY: [facility]    LEVEL OF CARE:   SNF   Acute Visit/Discharge Visit   CHIEF COMPLAINT:  Pre-discharge review.    HISTORY OF PRESENT ILLNESS:  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  He came here after a stay at [facility] with increasing ascites and hematemesis.  He had recently stopped drinking.  He has done reasonably well here in terms of his cirrhosis.  His abdomen is not that distended.    Last lab work showed a hemoglobin of 8.6, an albumin of 2.9.  His BUN, creatinine, and potassium were normal.     He is also a diabetic.  He was on oral agents prior to his hospitalization.  He is now on Levemir 10 U at h.s. and NovoLog 3 U a.c. meals.  He is also on a sliding scale.  His hemoglobin A1c appears to be reasonably stable at 6.7.     PHYSICAL EXAMINATION:   CHEST/RESPIRATORY:  Exam is clear.   CARDIOVASCULAR:  CARDIAC:   Heart sounds are normal.  He does not appear to be in heart failure.   GASTROINTESTINAL:  ABDOMEN:   Distended, but soft.  There is no tense ascites here.   CIRCULATION:  EDEMA/VARICOSITIES:   Extremities:  No edema.    ASSESSMENT/PLAN:  Cirrhosis of the liver.  I think this gentleman is stable.  He certainly needs lab work before he leaves including a hemoglobin, BUN and creatinine, and potassium.    Type 2 diabetes.  Now on insulin.  He is not familiar with this.  He was on oral agents before.  He will need insulin teaching.  There is involved family here.  It would probably be wise to have one of them help.    I have held his discharge until next week in order to do lab work and insulin teaching.    CPT CODE: 40981 (greater than 30 minute spent)

## 2013-05-25 NOTE — Telephone Encounter (Signed)
[facility] did not call me back and when I checked today, he was discharged. None of the numbers works and i call [employer] and the forwarding number left is for his sister, Zenovia Jarred [phone]. Left a message for her to call me back.

## 2013-05-31 NOTE — Telephone Encounter (Signed)
Sister never called back; mailed pt a letter asking him to call us.

## 2013-07-04 ENCOUNTER — Telehealth: Payer: Self-pay | Admitting: *Deleted

## 2013-07-04 NOTE — Telephone Encounter (Signed)
Per EPIC chart/notes, I cannot find where pt has called in or scheduled an EGD. Tried to call the number, but a recording states there call cannot be completed at this time.  Mailed another letter.

## 2013-07-04 NOTE — Telephone Encounter (Signed)
Message copied by Florene Glen on Mon Jul 04, 2013 10:31 AM ------      Message from: Florene Glen      Created: Tue May 31, 2013  3:53 PM       See if pt called back about scheduling EGD for f/u ------

## 2013-07-26 ENCOUNTER — Emergency Department (HOSPITAL_COMMUNITY)
Admission: EM | Admit: 2013-07-26 | Discharge: 2013-07-26 | Disposition: A | Discharge status: Home / Self Care | Payer: Non-veteran care | Attending: Emergency Medicine | Admitting: Emergency Medicine

## 2013-07-26 ENCOUNTER — Encounter (HOSPITAL_COMMUNITY): Payer: Self-pay | Admitting: Emergency Medicine

## 2013-07-26 DIAGNOSIS — R5383 Other fatigue: Principal | ICD-10-CM

## 2013-07-26 DIAGNOSIS — Z79899 Other long term (current) drug therapy: Secondary | ICD-10-CM | POA: Insufficient documentation

## 2013-07-26 DIAGNOSIS — Z8719 Personal history of other diseases of the digestive system: Secondary | ICD-10-CM | POA: Insufficient documentation

## 2013-07-26 DIAGNOSIS — Z862 Personal history of diseases of the blood and blood-forming organs and certain disorders involving the immune mechanism: Secondary | ICD-10-CM | POA: Insufficient documentation

## 2013-07-26 DIAGNOSIS — Z880 Allergy status to penicillin: Secondary | ICD-10-CM | POA: Insufficient documentation

## 2013-07-26 DIAGNOSIS — E119 Type 2 diabetes mellitus without complications: Secondary | ICD-10-CM | POA: Insufficient documentation

## 2013-07-26 DIAGNOSIS — Z794 Long term (current) use of insulin: Secondary | ICD-10-CM | POA: Insufficient documentation

## 2013-07-26 DIAGNOSIS — R531 Weakness: Secondary | ICD-10-CM

## 2013-07-26 DIAGNOSIS — Z7982 Long term (current) use of aspirin: Secondary | ICD-10-CM | POA: Insufficient documentation

## 2013-07-26 DIAGNOSIS — Z8619 Personal history of other infectious and parasitic diseases: Secondary | ICD-10-CM | POA: Insufficient documentation

## 2013-07-26 DIAGNOSIS — R5381 Other malaise: Secondary | ICD-10-CM | POA: Insufficient documentation

## 2013-07-26 DIAGNOSIS — F17200 Nicotine dependence, unspecified, uncomplicated: Secondary | ICD-10-CM | POA: Insufficient documentation

## 2013-07-26 DIAGNOSIS — R739 Hyperglycemia, unspecified: Secondary | ICD-10-CM | POA: Diagnosis present

## 2013-07-26 DIAGNOSIS — N644 Mastodynia: Secondary | ICD-10-CM | POA: Insufficient documentation

## 2013-07-26 DIAGNOSIS — Z9089 Acquired absence of other organs: Secondary | ICD-10-CM | POA: Insufficient documentation

## 2013-07-26 DIAGNOSIS — I10 Essential (primary) hypertension: Secondary | ICD-10-CM | POA: Insufficient documentation

## 2013-07-26 LAB — URINALYSIS W MICROSCOPIC + REFLEX CULTURE
Bilirubin Urine: NEGATIVE
Hgb urine dipstick: NEGATIVE
Ketones, ur: NEGATIVE mg/dL
LEUKOCYTES UA: NEGATIVE
Nitrite: NEGATIVE
PH: 7 (ref 5.0–8.0)
PROTEIN: NEGATIVE mg/dL
Specific Gravity, Urine: 1.04 — ABNORMAL HIGH (ref 1.005–1.030)
Urobilinogen, UA: 1 mg/dL (ref 0.0–1.0)

## 2013-07-26 LAB — COMPREHENSIVE METABOLIC PANEL
ALBUMIN: 2.4 g/dL — AB (ref 3.5–5.2)
ALT: 27 U/L (ref 0–53)
AST: 47 U/L — ABNORMAL HIGH (ref 0–37)
Alkaline Phosphatase: 319 U/L — ABNORMAL HIGH (ref 39–117)
BUN: 9 mg/dL (ref 6–23)
CO2: 20 mEq/L (ref 19–32)
CREATININE: 0.74 mg/dL (ref 0.50–1.35)
Calcium: 8.5 mg/dL (ref 8.4–10.5)
Chloride: 98 mEq/L (ref 96–112)
GFR calc Af Amer: >90 mL/min (ref >90)
GFR calc non Af Amer: >90 mL/min (ref >90)
Glucose, Bld: 433 mg/dL — ABNORMAL HIGH (ref 70–99)
Potassium: 4.6 mEq/L (ref 3.7–5.3)
Sodium: 129 mEq/L — ABNORMAL LOW (ref 137–147)
TOTAL PROTEIN: 6.4 g/dL (ref 6.0–8.3)
Total Bilirubin: 0.5 mg/dL (ref 0.3–1.2)

## 2013-07-26 LAB — CBC WITH DIFFERENTIAL/PLATELET
Basophils Absolute: 0.1 10*3/uL (ref 0.0–0.1)
Basophils Relative: 1 % (ref 0–1)
Eosinophils Absolute: 0.2 10*3/uL (ref 0.0–0.7)
Eosinophils Relative: 3 % (ref 0–5)
HCT: 32.4 % — ABNORMAL LOW (ref 39.0–52.0)
Hemoglobin: 11 g/dL — ABNORMAL LOW (ref 13.0–17.0)
LYMPHS ABS: 2.8 10*3/uL (ref 0.7–4.0)
Lymphocytes Relative: 44 % (ref 12–46)
MCH: 22.4 pg — AB (ref 26.0–34.0)
MCHC: 34 g/dL (ref 30.0–36.0)
MCV: 66.1 fL — AB (ref 78.0–100.0)
MONO ABS: 0.7 10*3/uL (ref 0.1–1.0)
Monocytes Relative: 11 % (ref 3–12)
NEUTROS PCT: 41 % — AB (ref 43–77)
Neutro Abs: 2.7 10*3/uL (ref 1.7–7.7)
PLATELETS: 161 10*3/uL (ref 150–400)
RBC: 4.9 MIL/uL (ref 4.22–5.81)
RDW: 20 % — AB (ref 11.5–15.5)
WBC: 6.5 10*3/uL (ref 4.0–10.5)

## 2013-07-26 LAB — GLUCOSE, CAPILLARY
Glucose-Capillary: 326 mg/dL — ABNORMAL HIGH (ref 70–99)
Glucose-Capillary: 388 mg/dL — ABNORMAL HIGH (ref 70–99)
Glucose-Capillary: 332 mg/dL — ABNORMAL HIGH (ref 70–99)
Glucose-Capillary: 384 mg/dL — ABNORMAL HIGH (ref 70–99)

## 2013-07-26 MED ORDER — SODIUM CHLORIDE 0.9 % IV BOLUS (SEPSIS)
1000.0000 mL | INTRAVENOUS | Status: AC
  Administered 2013-07-26: 1000 mL via INTRAVENOUS

## 2013-07-26 MED ORDER — INSULIN ASPART 100 UNIT/ML ~~LOC~~ SOLN
5.0000 [IU] | Freq: Once | SUBCUTANEOUS | Status: AC
  Administered 2013-07-26: 5 [IU] via INTRAVENOUS
  Filled 2013-07-26: qty 1

## 2013-07-26 NOTE — ED Provider Notes (Addendum)
CSN: [id number]     Arrival date & time 07/26/13  1005 History   First MD Initiated Contact with Patient 07/26/13 1014     Chief Complaint  Patient presents with  . Fatigue   (Consider location/radiation/quality/duration/timing/severity/associated sxs/prior Treatment) Patient is a 58 y.o. male presenting with hyperglycemia. The history is provided by the patient.  Hyperglycemia Blood sugar level PTA:  300's per EMS Severity:  Mild Onset quality:  Gradual Duration:  1 day Timing:  Constant Progression:  Partially resolved Chronicity:  New Diabetes status:  Controlled with insulin Current diabetic therapy:  Levemir at bedtime, novolog w/ meals Time since last antidiabetic medication:  2 hours Relieved by:  Nothing Ineffective treatments:  None tried Associated symptoms: no abdominal pain, no chest pain, no dysuria, no fever, no nausea, no shortness of breath and no vomiting     Past Medical History  Diagnosis Date  . Hypertension   . Diabetes mellitus without complication   . HCV (hepatitis C virus)   . 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 ETOH abuse   . Ascites 01/2013  . Hematoma 01/2013    posterior right flank from a fall  . 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   . Anemia 02/2013  . Hyponatremia 01/2013   Past Surgical History  Procedure Laterality Date  . Hernia repair    . Tonsillectomy    . Esophagogastroduodenoscopy N/A 03/03/2013    Procedure: ESOPHAGOGASTRODUODENOSCOPY (EGD);  Surgeon: Beverley FiedlerJay M Pyrtle, MD;  [facility];  Service: Gastroenterology;  Laterality: N/A;  Bedside   Family History  Problem Relation Age of Onset  . Dementia Mother   . Cancer - Other Father    History  Substance Use Topics  . Smoking status: Current Every Day Smoker -- 0.25 packs/day for 45 years    Types: Cigarettes  . Smokeless tobacco: Never Used  . Alcohol Use: Yes     Comment: 03/02/13 -"quit 2 weeks ago"    Review of Systems  Constitutional:  Negative for fever.  HENT: Negative for drooling and rhinorrhea.   Eyes: Negative for pain.  Respiratory: Negative for cough and shortness of breath.   Cardiovascular: Negative for chest pain and leg swelling.  Gastrointestinal: Negative for nausea, vomiting, abdominal pain and diarrhea.  Genitourinary: Negative for dysuria and hematuria.  Musculoskeletal: Negative for gait problem and neck pain.       Breast tenderness  Skin: Negative for color change.  Neurological: Positive for weakness (generalized). Negative for numbness and headaches.  Hematological: Negative for adenopathy.  Psychiatric/Behavioral: Negative for behavioral problems.  All other systems reviewed and are negative.    Allergies  Penicillins  Home Medications   Current Outpatient Rx  Name  Route  Sig  Dispense  Refill  . aspirin EC 81 MG tablet   Oral   Take 81 mg by mouth daily.         . cholecalciferol (VITAMIN D) 400 UNITS TABS tablet   Oral   Take 400 Units by mouth daily.         . furosemide (LASIX) 40 MG tablet   Oral   Take 1 tablet (40 mg total) by mouth 2 (two) times daily.   30 tablet      . insulin aspart (NOVOLOG) 100 UNIT/ML injection   Subcutaneous   Inject 3 Units into the skin 3 (three) times daily with meals.   1 vial   12   .  insulin aspart (NOVOLOG) 100 UNIT/ML injection      0-9 Units, Subcutaneous, 3 times daily with meals CBG < 70: implement hypoglycemia protocol CBG 70 - 120: 0 units CBG 121 - 150: 1 unit CBG 151 - 200: 2 units CBG 201 - 250: 3 units CBG 251 - 300: 5 units CBG 301 - 350: 7 units CBG 351 - 400: 9 units CBG > 400: call MD   1 vial   12   . insulin detemir (LEVEMIR) 100 UNIT/ML injection   Subcutaneous   Inject 0.1 mLs (10 Units total) into the skin at bedtime.   10 mL   12   . metFORMIN (GLUCOPHAGE) 1000 MG tablet   Oral   Take 1,000 mg by mouth 2 (two) times daily with a meal.         . nortriptyline (PAMELOR) 50 MG capsule   Oral    Take 50 mg by mouth at bedtime.         . polyethylene glycol (MIRALAX / GLYCOLAX) packet   Oral   Take 17 g by mouth daily.         . polyvinyl alcohol (LIQUIFILM TEARS) 1.4 % ophthalmic solution   Both Eyes   Place 1 drop into both eyes daily as needed (dry eyes).          . propranolol (INDERAL) 10 MG tablet   Oral   Take 1 tablet (10 mg total) by mouth 3 (three) times daily.         . rifaximin (XIFAXAN) 550 MG TABS tablet   Oral   Take 1 tablet (550 mg total) by mouth 2 (two) times daily at 10 AM and 5 PM.   60 tablet      . spironolactone (ALDACTONE) 100 MG tablet   Oral   Take 1 tablet (100 mg total) by mouth daily.          BP 111/79  Pulse 108  Temp(Src) 98.9 F (37.2 C) (Oral)  Resp 18  Ht 5\' 5"  (1.651 m)  Wt 191 lb (86.637 kg)  BMI 31.78 kg/m2  SpO2 98% Physical Exam  Nursing note and vitals reviewed. Constitutional: He is oriented to person, place, and time. He appears well-developed and well-nourished.  HENT:  Head: Normocephalic and atraumatic.  Right Ear: External ear normal.  Left Ear: External ear normal.  Nose: Nose normal.  Mouth/Throat: Oropharynx is clear and moist. No oropharyngeal exudate.  Eyes: Conjunctivae and EOM are normal. Pupils are equal, round, and reactive to light.  Neck: Normal range of motion. Neck supple.  Cardiovascular: Normal rate, regular rhythm, normal heart sounds and intact distal pulses.  Exam reveals no gallop and no friction rub.   No murmur heard. Pulmonary/Chest: Effort normal and breath sounds normal. No respiratory distress. He has no wheezes.  Abdominal: Soft. Bowel sounds are normal. He exhibits no distension. There is no tenderness. There is no rebound and no guarding.  Musculoskeletal: Normal range of motion. He exhibits no edema and no tenderness.  Neurological: He is alert and oriented to person, place, and time.  Skin: Skin is warm and dry.  Psychiatric: He has a normal mood and affect. His  behavior is normal.    ED Course  Procedures (including critical care time) Labs Review Labs Reviewed  CBC WITH DIFFERENTIAL - Abnormal; Notable for the following:    Hemoglobin 11.0 (*)    HCT 32.4 (*)    MCV 66.1 (*)    MCH 22.4 (*)  RDW 20.0 (*)    Neutrophils Relative % 41 (*)    All other components within normal limits  COMPREHENSIVE METABOLIC PANEL - Abnormal; Notable for the following:    Sodium 129 (*)    Glucose, Bld 433 (*)    Albumin 2.4 (*)    AST 47 (*)    Alkaline Phosphatase 319 (*)    All other components within normal limits  URINALYSIS W MICROSCOPIC + REFLEX CULTURE - Abnormal; Notable for the following:    Specific Gravity, Urine 1.040 (*)    Glucose, UA >1000 (*)    All other components within normal limits  GLUCOSE, CAPILLARY - Abnormal; Notable for the following:    Glucose-Capillary 326 (*)    All other components within normal limits  GLUCOSE, CAPILLARY - Abnormal; Notable for the following:    Glucose-Capillary 388 (*)    All other components within normal limits  GLUCOSE, CAPILLARY - Abnormal; Notable for the following:    Glucose-Capillary 332 (*)    All other components within normal limits   Imaging Review No results found.  EKG Interpretation    Date/Time:  Tuesday July 26 2013 10:47:07 EST Ventricular Rate:  107 PR Interval:  158 QRS Duration: 87 QT Interval:  330 QTC Calculation: 440 R Axis:   60 Text Interpretation:  Sinus tachycardia Inferior infarct, old No significant change since last tracing Confirmed by Nollie Shiflett  MD, Latravious Levitt ([id number]) on 07/26/2013 10:50:41 AM            MDM   1. Hyperglycemia   2. Generalized weakness    10:45 AM 58 y.o. male with a history of diabetes, hepatitis C who presents with elevated blood sugars. The patient states that he was seen at the [facility] yesterday for a hepatitis B shot. He notes mild generalized weakness since that time. He states that his blood sugar was in the 400s last night when  he took his long-acting insulin and went to bed. His blood sugar remained elevated this morning and he took 3 units of insulin at home. He notes that he ate a breakfast of grits, cheese, and 2 headaches. Here he denies any fevers, cough, chest pain, shortness of breath. He does note mild bilateral breast tenderness since starting a new medicine 2 weeks ago. He is afebrile here and noted to be mildly tachycardic. He has no other complaints on exam. He states his BS usually runs in the 200's. Will get screening labwork and IV fluid support his blood sugar.  Breast sx likely related to nortriptyline.   2:11 PM: BS remained stable w/ IVF and 5U insulin. Pt feeling well on exam, has no complaints, would like to go home and eat. Labs/UA/ecg non-contrib. Pt remains mildly tachycardic, but feels better and is s/p 2L IVF. I have discussed the diagnosis/risks/treatment options with the patient and believe the pt to be eligible for discharge home to follow-up with pcp as needed. We also discussed returning to the ED immediately if new or worsening sx occur. We discussed the sx which are most concerning (e.g., elevated BS's, fever, worsening gen weakness) that necessitate immediate return. Any new prescriptions provided to the patient are listed below.  New Prescriptions   No medications on file      Junius Argyle, MD 07/26/13 1413  Junius Argyle, MD 07/26/13 1417  Junius Argyle, MD 07/26/13 [phone]

## 2013-07-26 NOTE — ED Notes (Signed)
Pt as per EMS pt c/o of weakness and elevated blood sugar

## 2014-01-02 ENCOUNTER — Emergency Department (HOSPITAL_COMMUNITY): Payer: Non-veteran care

## 2014-01-02 ENCOUNTER — Encounter (HOSPITAL_COMMUNITY): Payer: Self-pay | Admitting: Emergency Medicine

## 2014-01-02 ENCOUNTER — Emergency Department (HOSPITAL_COMMUNITY)
Admission: EM | Admit: 2014-01-02 | Discharge: 2014-01-02 | Disposition: A | Discharge status: Home / Self Care | Payer: Non-veteran care | Attending: Emergency Medicine | Admitting: Emergency Medicine

## 2014-01-02 DIAGNOSIS — Z87828 Personal history of other (healed) physical injury and trauma: Secondary | ICD-10-CM | POA: Insufficient documentation

## 2014-01-02 DIAGNOSIS — Z8719 Personal history of other diseases of the digestive system: Secondary | ICD-10-CM | POA: Insufficient documentation

## 2014-01-02 DIAGNOSIS — F17200 Nicotine dependence, unspecified, uncomplicated: Secondary | ICD-10-CM | POA: Insufficient documentation

## 2014-01-02 DIAGNOSIS — S91109A Unspecified open wound of unspecified toe(s) without damage to nail, initial encounter: Secondary | ICD-10-CM | POA: Insufficient documentation

## 2014-01-02 DIAGNOSIS — Z794 Long term (current) use of insulin: Secondary | ICD-10-CM | POA: Insufficient documentation

## 2014-01-02 DIAGNOSIS — R000 Tachycardia, unspecified: Secondary | ICD-10-CM | POA: Insufficient documentation

## 2014-01-02 DIAGNOSIS — Y929 Unspecified place or not applicable: Secondary | ICD-10-CM | POA: Insufficient documentation

## 2014-01-02 DIAGNOSIS — Y939 Activity, unspecified: Secondary | ICD-10-CM | POA: Insufficient documentation

## 2014-01-02 DIAGNOSIS — Z862 Personal history of diseases of the blood and blood-forming organs and certain disorders involving the immune mechanism: Secondary | ICD-10-CM | POA: Insufficient documentation

## 2014-01-02 DIAGNOSIS — Z8619 Personal history of other infectious and parasitic diseases: Secondary | ICD-10-CM | POA: Insufficient documentation

## 2014-01-02 DIAGNOSIS — R059 Cough, unspecified: Secondary | ICD-10-CM | POA: Insufficient documentation

## 2014-01-02 DIAGNOSIS — X58XXXA Exposure to other specified factors, initial encounter: Secondary | ICD-10-CM | POA: Insufficient documentation

## 2014-01-02 DIAGNOSIS — I10 Essential (primary) hypertension: Secondary | ICD-10-CM | POA: Insufficient documentation

## 2014-01-02 DIAGNOSIS — R05 Cough: Secondary | ICD-10-CM | POA: Insufficient documentation

## 2014-01-02 DIAGNOSIS — Z7982 Long term (current) use of aspirin: Secondary | ICD-10-CM | POA: Insufficient documentation

## 2014-01-02 DIAGNOSIS — Z79899 Other long term (current) drug therapy: Secondary | ICD-10-CM | POA: Insufficient documentation

## 2014-01-02 DIAGNOSIS — R188 Other ascites: Secondary | ICD-10-CM | POA: Insufficient documentation

## 2014-01-02 DIAGNOSIS — Z880 Allergy status to penicillin: Secondary | ICD-10-CM | POA: Insufficient documentation

## 2014-01-02 DIAGNOSIS — Z792 Long term (current) use of antibiotics: Secondary | ICD-10-CM | POA: Insufficient documentation

## 2014-01-02 DIAGNOSIS — S91209A Unspecified open wound of unspecified toe(s) with damage to nail, initial encounter: Secondary | ICD-10-CM

## 2014-01-02 DIAGNOSIS — E119 Type 2 diabetes mellitus without complications: Secondary | ICD-10-CM | POA: Insufficient documentation

## 2014-01-02 DIAGNOSIS — M7989 Other specified soft tissue disorders: Secondary | ICD-10-CM | POA: Insufficient documentation

## 2014-01-02 LAB — COMPREHENSIVE METABOLIC PANEL
ALT: 39 U/L (ref 0–53)
AST: 59 U/L — ABNORMAL HIGH (ref 0–37)
Albumin: 2.9 g/dL — ABNORMAL LOW (ref 3.5–5.2)
Alkaline Phosphatase: 298 U/L — ABNORMAL HIGH (ref 39–117)
BUN: 7 mg/dL (ref 6–23)
CALCIUM: 9 mg/dL (ref 8.4–10.5)
CO2: 19 mEq/L (ref 19–32)
CREATININE: 0.76 mg/dL (ref 0.50–1.35)
Chloride: 102 mEq/L (ref 96–112)
GFR calc Af Amer: >90 mL/min (ref >90)
GFR calc non Af Amer: >90 mL/min (ref >90)
Glucose, Bld: 300 mg/dL — ABNORMAL HIGH (ref 70–99)
Potassium: 4.5 mEq/L (ref 3.7–5.3)
Sodium: 136 mEq/L — ABNORMAL LOW (ref 137–147)
TOTAL PROTEIN: 7.1 g/dL (ref 6.0–8.3)
Total Bilirubin: 0.8 mg/dL (ref 0.3–1.2)

## 2014-01-02 LAB — CBC WITH DIFFERENTIAL/PLATELET
BAND NEUTROPHILS: 0 % (ref 0–10)
BASOS ABS: 0.1 10*3/uL (ref 0.0–0.1)
BASOS PCT: 1 % (ref 0–1)
Blasts: 0 %
Eosinophils Absolute: 0.3 10*3/uL (ref 0.0–0.7)
Eosinophils Relative: 6 % — ABNORMAL HIGH (ref 0–5)
HEMATOCRIT: 33.9 % — AB (ref 39.0–52.0)
HEMOGLOBIN: 11 g/dL — AB (ref 13.0–17.0)
LYMPHS ABS: 2 10*3/uL (ref 0.7–4.0)
LYMPHS PCT: 34 % (ref 12–46)
MCH: 21 pg — ABNORMAL LOW (ref 26.0–34.0)
MCHC: 32.4 g/dL (ref 30.0–36.0)
MCV: 64.8 fL — ABNORMAL LOW (ref 78.0–100.0)
Metamyelocytes Relative: 0 %
Monocytes Absolute: 1 10*3/uL (ref 0.1–1.0)
Monocytes Relative: 17 % — ABNORMAL HIGH (ref 3–12)
Myelocytes: 0 %
Neutro Abs: 2.4 10*3/uL (ref 1.7–7.7)
Neutrophils Relative %: 42 % — ABNORMAL LOW (ref 43–77)
Platelets: 188 10*3/uL (ref 150–400)
Promyelocytes Absolute: 0 %
RBC: 5.23 MIL/uL (ref 4.22–5.81)
RDW: 17.5 % — ABNORMAL HIGH (ref 11.5–15.5)
WBC: 5.8 10*3/uL (ref 4.0–10.5)
nRBC: 0 /100 WBC

## 2014-01-02 LAB — CBG MONITORING, ED: Glucose-Capillary: 176 mg/dL — ABNORMAL HIGH (ref 70–99)

## 2014-01-02 MED ORDER — INSULIN ASPART 100 UNIT/ML ~~LOC~~ SOLN
5.0000 [IU] | Freq: Once | SUBCUTANEOUS | Status: AC
  Administered 2014-01-02: 5 [IU] via INTRAVENOUS
  Filled 2014-01-02: qty 1

## 2014-01-02 MED ORDER — SODIUM CHLORIDE 0.9 % IV BOLUS (SEPSIS)
1000.0000 mL | Freq: Once | INTRAVENOUS | Status: AC
  Administered 2014-01-02: 1000 mL via INTRAVENOUS

## 2014-01-02 NOTE — ED Notes (Signed)
Pt states injured left great toe at unknown date, was bleeding, toenail discolored, not bleeding at present-- pt also has distended abd, hx of ascites with paracentesis, pitting edema 1+ in left lower leg. States has been more swollen in past.

## 2014-01-02 NOTE — ED Provider Notes (Signed)
CSN: [id number]     Arrival date & time 01/02/14  1533 History   First MD Initiated Contact with Patient 01/02/14 1733     Chief Complaint  Patient presents with  . Toe Injury  . Ascites  . Leg Swelling     (Consider location/radiation/quality/duration/timing/severity/associated sxs/prior Treatment) HPI Comments: presents with pain/bleeding of L great toenail. Hx of injury to toe about 1 year ago after wich he lost the nail. He is not sure if he hurt it again, but noticed the nail looked discolored today. In opposition to the triage note, pt states his ascites & leg swelling are better than baseline and he has no complaints related to them. He does state he has had about 2 weeks of chronic, non-productive cough w/ occasional SOB when cough is bad, but no assoc CP, fever, chills. No recent n/v/d, or urinary symptoms.  Patient is a 58 y.o. male presenting with toe pain. The history is provided by the patient. No language interpreter was used.  Toe Pain This is a new problem. The current episode started 12 to 24 hours ago. The problem occurs constantly. The problem has not changed since onset.Pertinent negatives include no chest pain, no abdominal pain, no headaches and no shortness of breath. Exacerbated by: movement of toe. Nothing relieves the symptoms. He has tried nothing for the symptoms. The treatment provided no relief.    Past Medical History  Diagnosis Date  . Hypertension   . Diabetes mellitus without complication   . HCV (hepatitis C virus)   . 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 ETOH abuse   . Ascites 01/2013  . Hematoma 01/2013    posterior right flank from a fall  . 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   . Anemia 02/2013  . Hyponatremia 01/2013   Past Surgical History  Procedure Laterality Date  . Hernia repair    . Tonsillectomy    . Esophagogastroduodenoscopy N/A 03/03/2013    Procedure: ESOPHAGOGASTRODUODENOSCOPY (EGD);  Surgeon: Beverley FiedlerJay M Pyrtle,  MD;  [facility];  Service: Gastroenterology;  Laterality: N/A;  Bedside   Family History  Problem Relation Age of Onset  . Dementia Mother   . Cancer - Other Father    History  Substance Use Topics  . Smoking status: Current Every Day Smoker -- 0.25 packs/day for 45 years    Types: Cigarettes  . Smokeless tobacco: Never Used  . Alcohol Use: Yes     Comment: 6 pack a day on weekends    Review of Systems  Constitutional: Negative for fever, activity change, appetite change and fatigue.  HENT: Negative for congestion, facial swelling, rhinorrhea and trouble swallowing.   Eyes: Negative for photophobia and pain.  Respiratory: Positive for cough. Negative for chest tightness and shortness of breath.   Cardiovascular: Negative for chest pain and leg swelling.  Gastrointestinal: Negative for nausea, vomiting, abdominal pain, diarrhea and constipation.  Endocrine: Negative for polydipsia and polyuria.  Genitourinary: Negative for dysuria, urgency, decreased urine volume and difficulty urinating.  Musculoskeletal: Negative for back pain and gait problem.  Skin: Negative for color change, rash and wound.  Allergic/Immunologic: Negative for immunocompromised state.  Neurological: Negative for dizziness, facial asymmetry, speech difficulty, weakness, numbness and headaches.  Psychiatric/Behavioral: Negative for confusion, decreased concentration and agitation.      Allergies  Penicillins  Home Medications   Prior to Admission medications   Medication Sig Start Date End Date Taking? Authorizing Provider  aspirin EC 81 MG tablet Take 81 mg by mouth daily.   Yes Historical Provider, MD  cholecalciferol (VITAMIN D) 400 UNITS TABS tablet Take 400 Units by mouth daily.   Yes Historical Provider, MD  furosemide (LASIX) 40 MG tablet Take 1 tablet (40 mg total) by mouth 2 (two) times daily. 03/10/13  Yes Shanker Levora DredgeM Ghimire, MD  insulin aspart (NOVOLOG) 100 UNIT/ML injection 5 Units 3  (three) times daily with meals. 0-9 Units, Subcutaneous, 3 times daily with meals CBG < 70: implement hypoglycemia protocol CBG 70 - 120: 0 units CBG 121 - 150: 1 unit CBG 151 - 200: 2 units CBG 201 - 250: 3 units CBG 251 - 300: 5 units CBG 301 - 350: 7 units CBG 351 - 400: 9 units CBG > 400: call MD 03/10/13  Yes Shanker Levora DredgeM Ghimire, MD  insulin detemir (LEVEMIR) 100 UNIT/ML injection Inject 16 Units into the skin at bedtime. 03/10/13  Yes Shanker Levora DredgeM Ghimire, MD  metFORMIN (GLUCOPHAGE) 1000 MG tablet Take 1,000 mg by mouth 2 (two) times daily with a meal.   Yes Historical Provider, MD  nortriptyline (PAMELOR) 50 MG capsule Take 50 mg by mouth at bedtime.   Yes Historical Provider, MD  polyethylene glycol (MIRALAX / GLYCOLAX) packet Take 17 g by mouth daily.   Yes Historical Provider, MD  polyvinyl alcohol (LIQUIFILM TEARS) 1.4 % ophthalmic solution Place 1 drop into both eyes daily as needed (dry eyes).    Yes Historical Provider, MD  propranolol (INDERAL) 10 MG tablet Take 1 tablet (10 mg total) by mouth 3 (three) times daily. 03/10/13  Yes Shanker Levora DredgeM Ghimire, MD  rifaximin (XIFAXAN) 550 MG TABS tablet Take 1 tablet (550 mg total) by mouth 2 (two) times daily at 10 AM and 5 PM. 03/10/13  Yes Shanker Levora DredgeM Ghimire, MD  spironolactone (ALDACTONE) 100 MG tablet Take 1 tablet (100 mg total) by mouth daily. 03/10/13  Yes Shanker Levora DredgeM Ghimire, MD   BP 131/41  Pulse 119  Temp(Src) 98.2 F (36.8 C) (Oral)  Resp 20  Wt 183 lb 3.2 oz (83.099 kg)  SpO2 96% Physical Exam  Constitutional: He is oriented to person, place, and time. He appears well-developed and well-nourished. No distress.  HENT:  Head: Normocephalic and atraumatic.  Mouth/Throat: No oropharyngeal exudate.  Eyes: Pupils are equal, round, and reactive to light.  Neck: Normal range of motion. Neck supple.  Cardiovascular: Normal rate, regular rhythm and normal heart sounds.  Exam reveals no gallop and no friction rub.   No murmur  heard. Pulmonary/Chest: Effort normal and breath sounds normal. No respiratory distress. He has no wheezes. He has no rales.  Abdominal: Soft. Bowel sounds are normal. He exhibits no distension and no mass. There is no tenderness. There is no rebound and no guarding.  Musculoskeletal: Normal range of motion. He exhibits no edema and no tenderness.       Feet:  Neurological: He is alert and oriented to person, place, and time.  Skin: Skin is warm and dry.  Psychiatric: He has a normal mood and affect.    ED Course  Procedures (including critical care time) Labs Review Labs Reviewed  CBC WITH DIFFERENTIAL - Abnormal; Notable for the following:    Hemoglobin 11.0 (*)    HCT 33.9 (*)    MCV 64.8 (*)    MCH 21.0 (*)    RDW 17.5 (*)    Neutrophils Relative % 42 (*)    Monocytes Relative 17 (*)  Eosinophils Relative 6 (*)    All other components within normal limits  COMPREHENSIVE METABOLIC PANEL - Abnormal; Notable for the following:    Sodium 136 (*)    Glucose, Bld 300 (*)    Albumin 2.9 (*)    AST 59 (*)    Alkaline Phosphatase 298 (*)    All other components within normal limits  CBG MONITORING, ED - Abnormal; Notable for the following:    Glucose-Capillary 176 (*)    All other components within normal limits    Imaging Review Dg Chest 2 View  01/02/2014   CLINICAL DATA:  Cough.  EXAM: CHEST  2 VIEW  COMPARISON:  03/03/2013.  FINDINGS: The lungs are clear without focal infiltrate, edema, pneumothorax or pleural effusion. Cardiopericardial silhouette is at upper limits of normal for size. Imaged bony structures of the thorax are intact. Telemetry leads overlie the chest.  IMPRESSION: No active cardiopulmonary disease.   Electronically Signed   By: Kennith Center M.D.   On: 01/02/2014 19:35   Dg Toe Great Left  01/02/2014   CLINICAL DATA:  Toe injury. Unsure when. History of ethanol abuse and cirrhosis.  EXAM: LEFT GREAT TOE  COMPARISON:  None.  FINDINGS: No acute fracture or  dislocation. No osseous destruction. Soft tissue swelling about the great toe, primarily distally. No radiopaque foreign object.  IMPRESSION: Soft tissue swelling, without acute osseous abnormality.   Electronically Signed   By: Jeronimo Greaves M.D.   On: 01/02/2014 19:35     EKG Interpretation   Date/Time:  Monday January 02 2014 16:17:44 EDT Ventricular Rate:  119 PR Interval:  162 QRS Duration: 66 QT Interval:  298 QTC Calculation: 419 R Axis:   95 Text Interpretation:  Sinus tachycardia Rightward axis Cannot rule out  Anterior infarct , age undetermined Abnormal ECG Confirmed by DOCHERTY   MD, MEGAN [phone]) on 01/02/2014 5:34:47 PM      MDM   Final diagnoses:  Toenail avulsion, initial encounter  Cough    Pt is a 58 y.o. male with Pmhx as above who presents with pain/bleeding of L great toenail. Hx of injury to toe about 1 year ago after wich he lost the nail. He is not sure if he hurt it again, but noticed the nail looked discolored today. In opposition to the triage note, pt states his ascites & leg swelling are better than baseline and he has no complaints related to them. He does state he has had about 2 weeks of chronic, non-productive cough w/ occasional SOB when cough is bad, but no assoc CP, fever, chills. No recent n/v/d, or urinary symptoms. On PE, Pt tachycardic, but in NAD. Cardiopulm exam benign. Abdomen has mild fluid wave w/ ttp. 1+ BLLE pitting edema. L great toe nail is loosened from nail bed, but still attached. XR toe negative for bony injury. Nail will likely fall off, though as nail does nto appear infected, will leave it on for protection of nailbed. 2L IVF given w/o change of HR.  However, he remains well appearing, in NAD, w/ stable BP and nml WBC. He is not currently SOB and has no CP. I believe this may be due to ETOH withdraw as he admitted that he recently started drinking again.  5U insulin given with improvement of glucose to 170's. I believe he is safe to f/u  closely w/ [facility] for further w/u of cough.        Shanna Cisco, MD 01/02/14 2221

## 2014-01-02 NOTE — ED Notes (Signed)
Reported BP and heart rate to Dr. Micheline Mazeocherty, and reviewed patient's medical history. Will prepare for discharge.

## 2014-01-02 NOTE — ED Notes (Signed)
Patient transported to X-ray 

## 2014-01-02 NOTE — ED Notes (Signed)
CBG reading of 176.

## 2014-01-02 NOTE — Discharge Instructions (Signed)
Cough, Adult  A cough is a reflex that helps clear your throat and airways. It can help heal the body or may be a reaction to an irritated airway. A cough may only last 2 or 3 weeks (acute) or may last more than 8 weeks (chronic).  CAUSES Acute cough:  Viral or bacterial infections. Chronic cough:  Infections.  Allergies.  Asthma.  Post-nasal drip.  Smoking.  Heartburn or acid reflux.  Some medicines.  Chronic lung problems (COPD).  Cancer. SYMPTOMS   Cough.  Fever.  Chest pain.  Increased breathing rate.  High-pitched whistling sound when breathing (wheezing).  Colored mucus that you cough up (sputum). TREATMENT   A bacterial cough may be treated with antibiotic medicine.  A viral cough must run its course and will not respond to antibiotics.  Your caregiver may recommend other treatments if you have a chronic cough. HOME CARE INSTRUCTIONS   Only take over-the-counter or prescription medicines for pain, discomfort, or fever as directed by your caregiver. Use cough suppressants only as directed by your caregiver.  Use a cold steam vaporizer or humidifier in your bedroom or home to help loosen secretions.  Sleep in a semi-upright position if your cough is worse at night.  Rest as needed.  Stop smoking if you smoke. SEEK IMMEDIATE MEDICAL CARE IF:   You have pus in your sputum.  Your cough starts to worsen.  You cannot control your cough with suppressants and are losing sleep.  You begin coughing up blood.  You have difficulty breathing.  You develop pain which is getting worse or is uncontrolled with medicine.  You have a fever. MAKE SURE YOU:   Understand these instructions.  Will watch your condition.  Will get help right away if you are not doing well or get worse. Document Released: 12/27/2010 Document Revised: 09/22/2011 Document Reviewed: 12/27/2010 [facility] Patient Information 2015 [place], [place]. This information is not intended  to replace advice given to you by your health care provider. Make sure you discuss any questions you have with your health care provider.  Fingernail or Toenail Loss All or part of your fingernail or toenail has been lost. This may or may not grow back as a normal nail. A special non-stick bandage has been put on your finger or toe tightly to prevent bleeding. HOME CARE INSTRUCTIONS  The tips of fingers and toes are full of nerves and injuries are often very painful. The following will help you decrease the pain and obtain the best outcome.  Keep your hand or foot elevated above your heart to relieve pain and swelling. This will require lying in bed or on a couch with the hand or leg on pillows or sitting in a recliner with the leg up. Letting your hand or leg dangle may increase swelling, slow healing and cause throbbing pain.  Keep your dressing dry and clean.  Change your bandage in 24 hours after going home.  After your bandage is changed, soak your hand or foot in warm soapy water for 10 to 20 minutes. Do this 3 times per day. This helps reduce pain and swelling. After soaking, apply a clean, dry bandage. Change your bandage if it is wet or dirty.  Only take over-the-counter or prescription medicines for pain, discomfort, or fever as directed by your caregiver.  See your caregiver as needed for problems. SEEK IMMEDIATE MEDICAL CARE IF:   You have increased pain, swelling, drainage, or bleeding.  You have a fever. MAKE SURE YOU:  Understand these instructions.  Will watch your condition.  Will get help right away if you are not doing well or get worse. Document Released: 05/22/2006 Document Revised: 09/22/2011 Document Reviewed: 08/11/2006 [employer] Patient Information 2015 [place], [place]. This information is not intended to replace advice given to you by your health care provider. Make sure you discuss any questions you have with your health care provider.

## 2014-01-02 NOTE — ED Notes (Signed)
CBG 

## 2014-01-02 NOTE — ED Notes (Signed)
Dr. Micheline Mazeocherty at the bedside, reviewed plan of care. Heart rate 118-120's.  Patient will receive another 1L bolus before discharge.  Informed patient. Wrapped great toe on left foot.

## 2014-05-30 ENCOUNTER — Encounter (HOSPITAL_COMMUNITY): Payer: Self-pay | Admitting: Emergency Medicine

## 2014-05-30 ENCOUNTER — Emergency Department (HOSPITAL_COMMUNITY)
Admission: EM | Admit: 2014-05-30 | Discharge: 2014-05-30 | Disposition: A | Discharge status: Home / Self Care | Payer: Non-veteran care | Attending: Emergency Medicine | Admitting: Emergency Medicine

## 2014-05-30 ENCOUNTER — Emergency Department (HOSPITAL_COMMUNITY): Payer: Non-veteran care

## 2014-05-30 DIAGNOSIS — Z792 Long term (current) use of antibiotics: Secondary | ICD-10-CM | POA: Diagnosis not present

## 2014-05-30 DIAGNOSIS — R609 Edema, unspecified: Secondary | ICD-10-CM | POA: Insufficient documentation

## 2014-05-30 DIAGNOSIS — Z87828 Personal history of other (healed) physical injury and trauma: Secondary | ICD-10-CM | POA: Diagnosis not present

## 2014-05-30 DIAGNOSIS — Z794 Long term (current) use of insulin: Secondary | ICD-10-CM | POA: Insufficient documentation

## 2014-05-30 DIAGNOSIS — R059 Cough, unspecified: Secondary | ICD-10-CM

## 2014-05-30 DIAGNOSIS — Z8719 Personal history of other diseases of the digestive system: Secondary | ICD-10-CM | POA: Insufficient documentation

## 2014-05-30 DIAGNOSIS — E119 Type 2 diabetes mellitus without complications: Secondary | ICD-10-CM | POA: Insufficient documentation

## 2014-05-30 DIAGNOSIS — Z79899 Other long term (current) drug therapy: Secondary | ICD-10-CM | POA: Diagnosis not present

## 2014-05-30 DIAGNOSIS — J208 Acute bronchitis due to other specified organisms: Secondary | ICD-10-CM

## 2014-05-30 DIAGNOSIS — Z8619 Personal history of other infectious and parasitic diseases: Secondary | ICD-10-CM | POA: Diagnosis not present

## 2014-05-30 DIAGNOSIS — Z720 Tobacco use: Secondary | ICD-10-CM | POA: Diagnosis not present

## 2014-05-30 DIAGNOSIS — R05 Cough: Secondary | ICD-10-CM

## 2014-05-30 DIAGNOSIS — R000 Tachycardia, unspecified: Secondary | ICD-10-CM | POA: Insufficient documentation

## 2014-05-30 DIAGNOSIS — I10 Essential (primary) hypertension: Secondary | ICD-10-CM | POA: Insufficient documentation

## 2014-05-30 DIAGNOSIS — Z880 Allergy status to penicillin: Secondary | ICD-10-CM | POA: Insufficient documentation

## 2014-05-30 DIAGNOSIS — Z862 Personal history of diseases of the blood and blood-forming organs and certain disorders involving the immune mechanism: Secondary | ICD-10-CM | POA: Insufficient documentation

## 2014-05-30 DIAGNOSIS — Z7982 Long term (current) use of aspirin: Secondary | ICD-10-CM | POA: Insufficient documentation

## 2014-05-30 LAB — COMPREHENSIVE METABOLIC PANEL
ALBUMIN: 2.6 g/dL — AB (ref 3.5–5.2)
ALT: 45 U/L (ref 0–53)
AST: 79 U/L — AB (ref 0–37)
Alkaline Phosphatase: 369 U/L — ABNORMAL HIGH (ref 39–117)
Anion gap: 16 — ABNORMAL HIGH (ref 5–15)
BILIRUBIN TOTAL: 0.7 mg/dL (ref 0.3–1.2)
BUN: 6 mg/dL (ref 6–23)
CHLORIDE: 101 meq/L (ref 96–112)
CO2: 19 meq/L (ref 19–32)
Calcium: 8.5 mg/dL (ref 8.4–10.5)
Creatinine, Ser: 0.75 mg/dL (ref 0.50–1.35)
GFR calc Af Amer: >90 mL/min (ref >90)
GFR calc non Af Amer: >90 mL/min (ref >90)
Glucose, Bld: 299 mg/dL — ABNORMAL HIGH (ref 70–99)
POTASSIUM: 4.5 meq/L (ref 3.7–5.3)
Sodium: 136 mEq/L — ABNORMAL LOW (ref 137–147)
Total Protein: 7.2 g/dL (ref 6.0–8.3)

## 2014-05-30 LAB — CBC WITH DIFFERENTIAL/PLATELET
BASOS ABS: 0.1 10*3/uL (ref 0.0–0.1)
Basophils Relative: 1 % (ref 0–1)
EOS PCT: 3 % (ref 0–5)
Eosinophils Absolute: 0.2 10*3/uL (ref 0.0–0.7)
HCT: 36.1 % — ABNORMAL LOW (ref 39.0–52.0)
Hemoglobin: 11.5 g/dL — ABNORMAL LOW (ref 13.0–17.0)
LYMPHS PCT: 39 % (ref 12–46)
Lymphs Abs: 3 10*3/uL (ref 0.7–4.0)
MCH: 19.8 pg — ABNORMAL LOW (ref 26.0–34.0)
MCHC: 31.9 g/dL (ref 30.0–36.0)
MCV: 62 fL — ABNORMAL LOW (ref 78.0–100.0)
MONOS PCT: 13 % — AB (ref 3–12)
Monocytes Absolute: 1 10*3/uL (ref 0.1–1.0)
Neutro Abs: 3.5 10*3/uL (ref 1.7–7.7)
Neutrophils Relative %: 44 % (ref 43–77)
PLATELETS: 211 10*3/uL (ref 150–400)
RBC: 5.82 MIL/uL — AB (ref 4.22–5.81)
RDW: 21.3 % — AB (ref 11.5–15.5)
WBC: 7.8 10*3/uL (ref 4.0–10.5)

## 2014-05-30 MED ORDER — ALBUTEROL SULFATE HFA 108 (90 BASE) MCG/ACT IN AERS: 2.0000 | INHALATION_SPRAY | RESPIRATORY_TRACT | Status: AC | PRN

## 2014-05-30 MED ORDER — LEVOFLOXACIN 500 MG PO TABS: 500.0000 mg | ORAL_TABLET | Freq: Every day | ORAL | Status: DC

## 2014-05-30 MED ORDER — LEVOFLOXACIN 500 MG PO TABS
500.0000 mg | ORAL_TABLET | Freq: Once | ORAL | Status: AC
  Administered 2014-05-30: 500 mg via ORAL
  Filled 2014-05-30: qty 1

## 2014-05-30 MED ORDER — IPRATROPIUM-ALBUTEROL 0.5-2.5 (3) MG/3ML IN SOLN
3.0000 mL | Freq: Once | RESPIRATORY_TRACT | Status: AC
  Administered 2014-05-30: 3 mL via RESPIRATORY_TRACT
  Filled 2014-05-30: qty 3

## 2014-05-30 MED ORDER — PREDNISONE 50 MG PO TABS: 50.0000 mg | ORAL_TABLET | Freq: Every day | ORAL | Status: DC

## 2014-05-30 MED ORDER — PREDNISONE 10 MG PO TABS
20.0000 mg | ORAL_TABLET | Freq: Every day | ORAL | Status: DC
Start: 2014-05-30 — End: 2014-05-30

## 2014-05-30 MED ORDER — PREDNISONE 20 MG PO TABS
60.0000 mg | ORAL_TABLET | Freq: Once | ORAL | Status: AC
  Administered 2014-05-30: 60 mg via ORAL
  Filled 2014-05-30: qty 3

## 2014-05-30 NOTE — Discharge Instructions (Signed)
Acute Bronchitis Bronchitis is inflammation of the airways that extend from the windpipe into the lungs (bronchi). The inflammation often causes mucus to develop. This leads to a cough, which is the most common symptom of bronchitis.  In acute bronchitis, the condition usually develops suddenly and goes away over time, usually in a couple weeks. Smoking, allergies, and asthma can make bronchitis worse. Repeated episodes of bronchitis may cause further lung problems.  CAUSES Acute bronchitis is most often caused by the same virus that causes a cold. The virus can spread from person to person (contagious) through coughing, sneezing, and touching contaminated objects. SIGNS AND SYMPTOMS   Cough.   Fever.   Coughing up mucus.   Body aches.   Chest congestion.   Chills.   Shortness of breath.   Sore throat.  DIAGNOSIS  Acute bronchitis is usually diagnosed through a physical exam. Your health care provider will also ask you questions about your medical history. Tests, such as chest X-rays, are sometimes done to rule out other conditions.  TREATMENT  Acute bronchitis usually goes away in a couple weeks. Oftentimes, no medical treatment is necessary. Medicines are sometimes given for relief of fever or cough. Antibiotic medicines are usually not needed but may be prescribed in certain situations. In some cases, an inhaler may be recommended to help reduce shortness of breath and control the cough. A cool mist vaporizer may also be used to help thin bronchial secretions and make it easier to clear the chest.  HOME CARE INSTRUCTIONS  Get plenty of rest.   Drink enough fluids to keep your urine clear or pale yellow (unless you have a medical condition that requires fluid restriction). Increasing fluids may help thin your respiratory secretions (sputum) and reduce chest congestion, and it will prevent dehydration.   Take medicines only as directed by your health care provider.  If  you were prescribed an antibiotic medicine, finish it all even if you start to feel better.  Avoid smoking and secondhand smoke. Exposure to cigarette smoke or irritating chemicals will make bronchitis worse. If you are a smoker, consider using nicotine gum or skin patches to help control withdrawal symptoms. Quitting smoking will help your lungs heal faster.   Reduce the chances of another bout of acute bronchitis by washing your hands frequently, avoiding people with cold symptoms, and trying not to touch your hands to your mouth, nose, or eyes.   Keep all follow-up visits as directed by your health care provider.  SEEK MEDICAL CARE IF: Your symptoms do not improve after 1 week of treatment.  SEEK IMMEDIATE MEDICAL CARE IF:  You develop an increased fever or chills.   You have chest pain.   You have severe shortness of breath.  You have bloody sputum.   You develop dehydration.  You faint or repeatedly feel like you are going to pass out.  You develop repeated vomiting.  You develop a severe headache. MAKE SURE YOU:   Understand these instructions.  Will watch your condition.  Will get help right away if you are not doing well or get worse. Document Released: 08/07/2004 Document Revised: 11/14/2013 Document Reviewed: 12/21/2012 [facility] Patient Information 2015 [place], [place]. This information is not intended to replace advice given to you by your health care provider. Make sure you discuss any questions you have with your health care provider.  Levofloxacin tablets What is this medicine? LEVOFLOXACIN (lee voe FLOX a sin) is a quinolone antibiotic. It is used to treat certain kinds  of bacterial infections. It will not work for colds, flu, or other viral infections. This medicine may be used for other purposes; ask your health care provider or pharmacist if you have questions. COMMON BRAND NAME(S): Levaquin, Levaquin Leva-Pak What should I tell my health care provider  before I take this medicine? They need to know if you have any of these conditions: -cerebral disease -irregular heartbeat -kidney disease -seizure disorder -an unusual or allergic reaction to levofloxacin, other antibiotics or medicines, foods, dyes, or preservatives -pregnant or trying to get pregnant -breast-feeding How should I use this medicine? Take this medicine by mouth with a full glass of water. Follow the directions on the prescription label. This medicine can be taken with or without food. Take your medicine at regular intervals. Do not take your medicine more often than directed. Do not skip doses or stop your medicine early even if you feel better. Do not stop taking except on your doctor's advice. A special MedGuide will be given to you by the pharmacist with each prescription and refill. Be sure to read this information carefully each time. Talk to your pediatrician regarding the use of this medicine in children. While this drug may be prescribed for children as young as 6 months for selected conditions, precautions do apply. Overdosage: If you think you have taken too much of this medicine contact a poison control center or emergency room at once. NOTE: This medicine is only for you. Do not share this medicine with others. What if I miss a dose? If you miss a dose, take it as soon as you remember. If it is almost time for your next dose, take only that dose. Do not take double or extra doses. What may interact with this medicine? Do not take this medicine with any of the following medications: -arsenic trioxide -chloroquine -droperidol -medicines for irregular heart rhythm like amiodarone, disopyramide, dofetilide, flecainide, quinidine, procainamide, sotalol -some medicines for depression or mental problems like phenothiazines, pimozide, and ziprasidone This medicine may also interact with the following medications: -amoxapine -antacids -birth control  pills -cisapride -dairy products -didanosine (ddI) buffered tablets or powder -haloperidol -multivitamins -NSAIDS, medicines for pain and inflammation, like ibuprofen or naproxen -retinoid products like tretinoin or isotretinoin -risperidone -some other antibiotics like clarithromycin or erythromycin -sucralfate -theophylline -warfarin This list may not describe all possible interactions. Give your health care provider a list of all the medicines, herbs, non-prescription drugs, or dietary supplements you use. Also tell them if you smoke, drink alcohol, or use illegal drugs. Some items may interact with your medicine. What should I watch for while using this medicine? Tell your doctor or health care professional if your symptoms do not improve or if they get worse. Drink several glasses of water a day and cut down on drinks that contain caffeine. You must not get dehydrated while taking this medicine. You may get drowsy or dizzy. Do not drive, use machinery, or do anything that needs mental alertness until you know how this medicine affects you. Do not sit or stand up quickly, especially if you are an older patient. This reduces the risk of dizzy or fainting spells. This medicine can make you more sensitive to the sun. Keep out of the sun. If you cannot avoid being in the sun, wear protective clothing and use a sunscreen. Do not use sun lamps or tanning beds/booths. Contact your doctor if you get a sunburn. If you are a diabetic monitor your blood glucose carefully. If you get an  unusual reading stop taking this medicine and call your doctor right away. Do not treat diarrhea with over-the-counter products. Contact your doctor if you have diarrhea that lasts more than 2 days or if the diarrhea is severe and watery. Avoid antacids, calcium, iron, and zinc products for 2 hours before and 2 hours after taking a dose of this medicine. What side effects may I notice from receiving this medicine? Side  effects that you should report to your doctor or health care professional as soon as possible: -allergic reactions like skin rash or hives, swelling of the face, lips, or tongue -changes in vision -confusion, nightmares or hallucinations -difficulty breathing -irregular heartbeat, chest pain -joint, muscle or tendon pain -pain or difficulty passing urine -persistent headache with or without blurred vision -redness, blistering, peeling or loosening of the skin, including inside the mouth -seizures -unusual pain, numbness, tingling, or weakness -vaginal irritation, discharge Side effects that usually do not require medical attention (report to your doctor or health care professional if they continue or are bothersome): -diarrhea -dry mouth -headache -stomach upset, nausea -trouble sleeping This list may not describe all possible side effects. Call your doctor for medical advice about side effects. You may report side effects to FDA at 1-800-FDA-1088. Where should I keep my medicine? Keep out of the reach of children. Store at room temperature between 15 and 30 degrees C (59 and 86 degrees F). Keep in a tightly closed container. Throw away any unused medicine after the expiration date. NOTE: This sheet is a summary. It may not cover all possible information. If you have questions about this medicine, talk to your doctor, pharmacist, or health care provider.  2015, [employer]. (2013-02-04 07:45:07)  Prednisone tablets What is this medicine? PREDNISONE (PRED ni sone) is a corticosteroid. It is commonly used to treat inflammation of the skin, joints, lungs, and other organs. Common conditions treated include asthma, allergies, and arthritis. It is also used for other conditions, such as blood disorders and diseases of the adrenal glands. This medicine may be used for other purposes; ask your health care provider or pharmacist if you have questions. COMMON BRAND NAME(S):  Deltasone, Predone, Sterapred, Sterapred DS What should I tell my health care provider before I take this medicine? They need to know if you have any of these conditions: -Cushing's syndrome -diabetes -glaucoma -heart disease -high blood pressure -infection (especially a virus infection such as chickenpox, cold sores, or herpes) -kidney disease -liver disease -mental illness -myasthenia gravis -osteoporosis -seizures -stomach or intestine problems -thyroid disease -an unusual or allergic reaction to lactose, prednisone, other medicines, foods, dyes, or preservatives -pregnant or trying to get pregnant -breast-feeding How should I use this medicine? Take this medicine by mouth with a glass of water. Follow the directions on the prescription label. Take this medicine with food. If you are taking this medicine once a day, take it in the morning. Do not take more medicine than you are told to take. Do not suddenly stop taking your medicine because you may develop a severe reaction. Your doctor will tell you how much medicine to take. If your doctor wants you to stop the medicine, the dose may be slowly lowered over time to avoid any side effects. Talk to your pediatrician regarding the use of this medicine in children. Special care may be needed. Overdosage: If you think you have taken too much of this medicine contact a poison control center or emergency room at once. NOTE: This medicine is only for  you. Do not share this medicine with others. What if I miss a dose? If you miss a dose, take it as soon as you can. If it is almost time for your next dose, talk to your doctor or health care professional. You may need to miss a dose or take an extra dose. Do not take double or extra doses without advice. What may interact with this medicine? Do not take this medicine with any of the following medications: -metyrapone -mifepristone This medicine may also interact with the following  medications: -aminoglutethimide -amphotericin B -aspirin and aspirin-like medicines -barbiturates -certain medicines for diabetes, like glipizide or glyburide -cholestyramine -cholinesterase inhibitors -cyclosporine -digoxin -diuretics -ephedrine -male hormones, like estrogens and birth control pills -isoniazid -ketoconazole -NSAIDS, medicines for pain and inflammation, like ibuprofen or naproxen -phenytoin -rifampin -toxoids -vaccines -warfarin This list may not describe all possible interactions. Give your health care provider a list of all the medicines, herbs, non-prescription drugs, or dietary supplements you use. Also tell them if you smoke, drink alcohol, or use illegal drugs. Some items may interact with your medicine. What should I watch for while using this medicine? Visit your doctor or health care professional for regular checks on your progress. If you are taking this medicine over a prolonged period, carry an identification card with your name and address, the type and dose of your medicine, and your doctor's name and address. This medicine may increase your risk of getting an infection. Tell your doctor or health care professional if you are around anyone with measles or chickenpox, or if you develop sores or blisters that do not heal properly. If you are going to have surgery, tell your doctor or health care professional that you have taken this medicine within the last twelve months. Ask your doctor or health care professional about your diet. You may need to lower the amount of salt you eat. This medicine may affect blood sugar levels. If you have diabetes, check with your doctor or health care professional before you change your diet or the dose of your diabetic medicine. What side effects may I notice from receiving this medicine? Side effects that you should report to your doctor or health care professional as soon as possible: -allergic reactions like skin rash,  itching or hives, swelling of the face, lips, or tongue -changes in emotions or moods -changes in vision -depressed mood -eye pain -fever or chills, cough, sore throat, pain or difficulty passing urine -increased thirst -swelling of ankles, feet Side effects that usually do not require medical attention (report to your doctor or health care professional if they continue or are bothersome): -confusion, excitement, restlessness -headache -nausea, vomiting -skin problems, acne, thin and shiny skin -trouble sleeping -weight gain This list may not describe all possible side effects. Call your doctor for medical advice about side effects. You may report side effects to FDA at 1-800-FDA-1088. Where should I keep my medicine? Keep out of the reach of children. Store at room temperature between 15 and 30 degrees C (59 and 86 degrees F). Protect from light. Keep container tightly closed. Throw away any unused medicine after the expiration date. NOTE: This sheet is a summary. It may not cover all possible information. If you have questions about this medicine, talk to your doctor, pharmacist, or health care provider.  2015, [employer] Standard. (2011-02-13 10:57:14)  Albuterol inhalation aerosol What is this medicine? ALBUTEROL (al [address] ole) is a bronchodilator. It helps open up the airways in your lungs to  make it easier to breathe. This medicine is used to treat and to prevent bronchospasm. This medicine may be used for other purposes; ask your health care provider or pharmacist if you have questions. COMMON BRAND NAME(S): Proair HFA, Proventil, Proventil HFA, Respirol, Ventolin, Ventolin HFA What should I tell my health care provider before I take this medicine? They need to know if you have any of the following conditions: -diabetes -heart disease or irregular heartbeat -high blood pressure -pheochromocytoma -seizures -thyroid disease -an unusual or allergic reaction to albuterol,  levalbuterol, sulfites, other medicines, foods, dyes, or preservatives -pregnant or trying to get pregnant -breast-feeding How should I use this medicine? This medicine is for inhalation through the mouth. Follow the directions on your prescription label. Take your medicine at regular intervals. Do not use more often than directed. Make sure that you are using your inhaler correctly. Ask you doctor or health care provider if you have any questions. Talk to your pediatrician regarding the use of this medicine in children. Special care may be needed. Overdosage: If you think you have taken too much of this medicine contact a poison control center or emergency room at once. NOTE: This medicine is only for you. Do not share this medicine with others. What if I miss a dose? If you miss a dose, use it as soon as you can. If it is almost time for your next dose, use only that dose. Do not use double or extra doses. What may interact with this medicine? -anti-infectives like chloroquine and pentamidine -caffeine -cisapride -diuretics -medicines for colds -medicines for depression or for emotional or psychotic conditions -medicines for weight loss including some herbal products -methadone -some antibiotics like clarithromycin, erythromycin, levofloxacin, and linezolid -some heart medicines -steroid hormones like dexamethasone, cortisone, hydrocortisone -theophylline -thyroid hormones This list may not describe all possible interactions. Give your health care provider a list of all the medicines, herbs, non-prescription drugs, or dietary supplements you use. Also tell them if you smoke, drink alcohol, or use illegal drugs. Some items may interact with your medicine. What should I watch for while using this medicine? Tell your doctor or health care professional if your symptoms do not improve. Do not use extra albuterol. If your asthma or bronchitis gets worse while you are using this medicine, call  your doctor right away. If your mouth gets dry try chewing sugarless gum or sucking hard candy. Drink water as directed. What side effects may I notice from receiving this medicine? Side effects that you should report to your doctor or health care professional as soon as possible: -allergic reactions like skin rash, itching or hives, swelling of the face, lips, or tongue -breathing problems -chest pain -feeling faint or lightheaded, falls -high blood pressure -irregular heartbeat -fever -muscle cramps or weakness -pain, tingling, numbness in the hands or feet -vomiting Side effects that usually do not require medical attention (report to your doctor or health care professional if they continue or are bothersome): -cough -difficulty sleeping -headache -nervousness or trembling -stomach upset -stuffy or runny nose -throat irritation -unusual taste This list may not describe all possible side effects. Call your doctor for medical advice about side effects. You may report side effects to FDA at 1-800-FDA-1088. Where should I keep my medicine? Keep out of the reach of children. Store at room temperature between 15 and 30 degrees C (59 and 86 degrees F). The contents are under pressure and may burst when exposed to heat or flame. Do not freeze. This  medicine does not work as well if it is too cold. Throw away any unused medicine after the expiration date. Inhalers need to be thrown away after the labeled number of puffs have been used or by the expiration date; whichever comes first. Ventolin HFA should be thrown away 12 months after removing from foil pouch. Check the instructions that come with your medicine. NOTE: This sheet is a summary. It may not cover all possible information. If you have questions about this medicine, talk to your doctor, pharmacist, or health care provider.  2015, [employer] Standard. (2012-12-16 10:57:17)

## 2014-05-30 NOTE — ED Notes (Signed)
Dr. Preston FleetingGlick made aware that patients heart rate is 140, alert and oriented.  MD okay with patient being admitted with the elevated heart rate.

## 2014-05-30 NOTE — ED Provider Notes (Signed)
CSN: [id number]     Arrival date & time 05/30/14  [id number] History   First MD Initiated Contact with Patient 05/30/14 [phone]     Chief Complaint  Patient presents with  . Cough  . Shortness of Breath     (Consider location/radiation/quality/duration/timing/severity/associated sxs/prior Treatment) Patient is a 58 y.o. male presenting with cough and shortness of breath. The history is provided by the patient.  Cough Associated symptoms: shortness of breath   Shortness of Breath Associated symptoms: cough   He comes in with a 3 week history of cough productive of white to yellow to brown sputum. He denies fever, chills, sweats. He denies chest pain. There is mild to moderate dyspnea. Sputum is occasionally blood streaked. He denies abdominal pain, nausea, vomiting, diarrhea. He has not done anything to treat his cough or dyspnea.  Past Medical History  Diagnosis Date  . Hypertension   . Diabetes mellitus without complication   . HCV (hepatitis C virus)   . 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 ETOH abuse   . Ascites 01/2013  . Hematoma 01/2013    posterior right flank from a fall  . 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   . Anemia 02/2013  . Hyponatremia 01/2013   Past Surgical History  Procedure Laterality Date  . Hernia repair    . Tonsillectomy    . Esophagogastroduodenoscopy N/A 03/03/2013    Procedure: ESOPHAGOGASTRODUODENOSCOPY (EGD);  Surgeon: Beverley FiedlerJay M Pyrtle, MD;  Location: [facility];  Service: Gastroenterology;  Laterality: N/A;  Bedside   Family History  Problem Relation Age of Onset  . Dementia Mother   . Cancer - Other Father    History  Substance Use Topics  . Smoking status: Current Every Day Smoker -- 0.25 packs/day for 45 years    Types: Cigarettes  . Smokeless tobacco: Never Used  . Alcohol Use: Yes     Comment: 6 pack a day on weekends    Review of Systems  Respiratory: Positive for cough and shortness of breath.   All other systems  reviewed and are negative.     Allergies  Penicillins  Home Medications   Prior to Admission medications   Medication Sig Start Date End Date Taking? Authorizing Provider  aspirin EC 81 MG tablet Take 81 mg by mouth daily.    Historical Provider, MD  cholecalciferol (VITAMIN D) 400 UNITS TABS tablet Take 400 Units by mouth daily.    Historical Provider, MD  furosemide (LASIX) 40 MG tablet Take 1 tablet (40 mg total) by mouth 2 (two) times daily. 03/10/13   Shanker Levora DredgeM Ghimire, MD  insulin aspart (NOVOLOG) 100 UNIT/ML injection 5 Units 3 (three) times daily with meals. 0-9 Units, Subcutaneous, 3 times daily with meals CBG < 70: implement hypoglycemia protocol CBG 70 - 120: 0 units CBG 121 - 150: 1 unit CBG 151 - 200: 2 units CBG 201 - 250: 3 units CBG 251 - 300: 5 units CBG 301 - 350: 7 units CBG 351 - 400: 9 units CBG > 400: call MD 03/10/13   Maretta BeesShanker M Ghimire, MD  insulin detemir (LEVEMIR) 100 UNIT/ML injection Inject 16 Units into the skin at bedtime. 03/10/13   Shanker Levora DredgeM Ghimire, MD  metFORMIN (GLUCOPHAGE) 1000 MG tablet Take 1,000 mg by mouth 2 (two) times daily with a meal.    Historical Provider, MD  nortriptyline (PAMELOR) 50 MG capsule Take 50 mg by mouth at bedtime.  Historical Provider, MD  polyethylene glycol (MIRALAX / GLYCOLAX) packet Take 17 g by mouth daily.    Historical Provider, MD  polyvinyl alcohol (LIQUIFILM TEARS) 1.4 % ophthalmic solution Place 1 drop into both eyes daily as needed (dry eyes).     Historical Provider, MD  propranolol (INDERAL) 10 MG tablet Take 1 tablet (10 mg total) by mouth 3 (three) times daily. 03/10/13   Shanker Levora Dredge, MD  rifaximin (XIFAXAN) 550 MG TABS tablet Take 1 tablet (550 mg total) by mouth 2 (two) times daily at 10 AM and 5 PM. 03/10/13   Maretta Bees, MD  spironolactone (ALDACTONE) 100 MG tablet Take 1 tablet (100 mg total) by mouth daily. 03/10/13   Shanker Levora Dredge, MD   BP 130/86 mmHg  Pulse 138  Temp(Src) 97.9 F  (36.6 C)  Resp 21  Ht 5\' 6"  (1.676 m)  Wt 197 lb (89.359 kg)  BMI 31.81 kg/m2  SpO2 95% Physical Exam  Nursing note and vitals reviewed.  58 year old male, resting comfortably and in no acute distress. Vital signs are significant for tachycardia and mild tachypnea. Oxygen saturation is 95%, which is normal. Head is normocephalic and atraumatic. PERRLA, EOMI. Oropharynx is clear. Neck is nontender and supple without adenopathy or JVD. Back is nontender and there is no CVA tenderness. Lungs have few bibasilar rales and moderate expiratory wheezes with coughing but no wheezing on tidal respirations. There are no rhonchi. Chest is nontender. Heart has regular rate and rhythm without murmur. Abdomen is soft, flat, nontender without masses or hepatosplenomegaly and peristalsis is normoactive. Extremities have 2+ edema, full range of motion is present. Skin is warm and dry without rash. Neurologic: Mental status is normal, cranial nerves are intact, there are no motor or sensory deficits.  ED Course  Procedures (including critical care time) Labs Review Results for orders placed or performed during the hospital encounter of 05/30/14  CBC with Differential  Result Value Ref Range   WBC 7.8 4.0 - 10.5 K/uL   RBC 5.82 (H) 4.22 - 5.81 MIL/uL   Hemoglobin 11.5 (L) 13.0 - 17.0 g/dL   HCT 08.6 (L) 57.8 - 46.9 %   MCV 62.0 (L) 78.0 - 100.0 fL   MCH 19.8 (L) 26.0 - 34.0 pg   MCHC 31.9 30.0 - 36.0 g/dL   RDW 62.9 (H) 52.8 - 41.3 %   Platelets 211 150 - 400 K/uL   Neutrophils Relative % 44 43 - 77 %   Lymphocytes Relative 39 12 - 46 %   Monocytes Relative 13 (H) 3 - 12 %   Eosinophils Relative 3 0 - 5 %   Basophils Relative 1 0 - 1 %   Neutro Abs 3.5 1.7 - 7.7 K/uL   Lymphs Abs 3.0 0.7 - 4.0 K/uL   Monocytes Absolute 1.0 0.1 - 1.0 K/uL   Eosinophils Absolute 0.2 0.0 - 0.7 K/uL   Basophils Absolute 0.1 0.0 - 0.1 K/uL   RBC Morphology POLYCHROMASIA PRESENT   Comprehensive metabolic panel   Result Value Ref Range   Sodium 136 (L) 137 - 147 mEq/L   Potassium 4.5 3.7 - 5.3 mEq/L   Chloride 101 96 - 112 mEq/L   CO2 19 19 - 32 mEq/L   Glucose, Bld 299 (H) 70 - 99 mg/dL   BUN 6 6 - 23 mg/dL   Creatinine, Ser 2.44 0.50 - 1.35 mg/dL   Calcium 8.5 8.4 - 01.0 mg/dL   Total Protein 7.2 6.0 -  8.3 g/dL   Albumin 2.6 (L) 3.5 - 5.2 g/dL   AST 79 (H) 0 - 37 U/L   ALT 45 0 - 53 U/L   Alkaline Phosphatase 369 (H) 39 - 117 U/L   Total Bilirubin 0.7 0.3 - 1.2 mg/dL   GFR calc non Af Amer >90 >90 mL/min   GFR calc Af Amer >90 >90 mL/min   Anion gap 16 (H) 5 - 15   Imaging Review Dg Chest 2 View  05/30/2014   CLINICAL DATA:  Cough and shortness of breath for 3 weeks  EXAM: CHEST  2 VIEW  COMPARISON:  01/02/2014  FINDINGS: Shallow inspiration with linear atelectasis in the lung bases. Borderline heart size with normal pulmonary vascularity. No focal airspace disease in the lungs. No blunting of costophrenic angles. No pneumothorax. Tortuous aorta.  IMPRESSION: Shallow inspiration with atelectasis in the lung bases.   Electronically Signed   By: Burman NievesWilliam  Stevens M.D.   On: 05/30/2014 06:49     EKG Interpretation   Date/Time:  Tuesday May 30 2014 05:44:14 EST Ventricular Rate:  134 PR Interval:  110 QRS Duration: 79 QT Interval:  299 QTC Calculation: 446 R Axis:   69 Text Interpretation:  Sinus tachycardia Otherwise within normal limits  When compared with ECG of 01/02/2014, No significant change was found  Confirmed by [facility]  MD, Johngabriel Verde ([id number]) on 05/30/2014 5:47:53 AM      MDM   Final diagnoses:  Cough  Acute bronchitis due to other specified organisms    Cough with tachycardia and wheezing. He will be sent for chest x-ray to rule out pneumonia and will be given a trial of albuterol with ipratropium. Old records are reviewed and he has several ED visits related to alcoholic liver disease and hematemesis but no prior visits for respiratory issues.  He had excellent  relief of symptoms with albuterol with ipratropium. Chest x-ray shows no evidence of pneumonia. Patient is resting comfortably and in no distress although he continues to be tachycardic. Review of old records shows that he has been tachycardic on his last several ED visits. Decision is made to discharge him with prescriptions for prednisone, levofloxacin, and an albuterol inhaler. Is to follow-up with his physician at the [facility] in one week.   Dione Boozeavid Ubaldo Daywalt, MD 05/30/14 2322

## 2014-05-30 NOTE — ED Notes (Signed)
Pt brought to ED by [facility] from home for c/o constant cough and SOB, pt states he is been having this cough for 3 weeks, and now he is coughing up some bloody secretion, denies any CP, dizziness or fever.

## 2014-09-19 ENCOUNTER — Encounter (HOSPITAL_COMMUNITY): Payer: Self-pay | Admitting: Emergency Medicine

## 2014-09-19 ENCOUNTER — Emergency Department (HOSPITAL_COMMUNITY): Payer: Non-veteran care

## 2014-09-19 ENCOUNTER — Other Ambulatory Visit (HOSPITAL_COMMUNITY): Payer: Self-pay

## 2014-09-19 ENCOUNTER — Inpatient Hospital Stay (HOSPITAL_COMMUNITY)
Admission: EM | Admit: 2014-09-19 | Discharge: 2014-09-21 | DRG: 432 | Disposition: A | Discharge status: Home / Self Care | Payer: Non-veteran care | Attending: Internal Medicine | Admitting: Internal Medicine

## 2014-09-19 DIAGNOSIS — D509 Iron deficiency anemia, unspecified: Secondary | ICD-10-CM | POA: Diagnosis present

## 2014-09-19 DIAGNOSIS — Z820 Family history of epilepsy and other diseases of the nervous system: Secondary | ICD-10-CM | POA: Diagnosis not present

## 2014-09-19 DIAGNOSIS — K652 Spontaneous bacterial peritonitis: Secondary | ICD-10-CM | POA: Diagnosis present

## 2014-09-19 DIAGNOSIS — Z79899 Other long term (current) drug therapy: Secondary | ICD-10-CM | POA: Diagnosis not present

## 2014-09-19 DIAGNOSIS — R600 Localized edema: Secondary | ICD-10-CM | POA: Diagnosis present

## 2014-09-19 DIAGNOSIS — R000 Tachycardia, unspecified: Secondary | ICD-10-CM | POA: Diagnosis present

## 2014-09-19 DIAGNOSIS — J449 Chronic obstructive pulmonary disease, unspecified: Secondary | ICD-10-CM | POA: Diagnosis present

## 2014-09-19 DIAGNOSIS — F17210 Nicotine dependence, cigarettes, uncomplicated: Secondary | ICD-10-CM | POA: Diagnosis present

## 2014-09-19 DIAGNOSIS — E1165 Type 2 diabetes mellitus with hyperglycemia: Secondary | ICD-10-CM | POA: Diagnosis present

## 2014-09-19 DIAGNOSIS — Z809 Family history of malignant neoplasm, unspecified: Secondary | ICD-10-CM

## 2014-09-19 DIAGNOSIS — E876 Hypokalemia: Secondary | ICD-10-CM | POA: Diagnosis present

## 2014-09-19 DIAGNOSIS — R0602 Shortness of breath: Secondary | ICD-10-CM | POA: Diagnosis present

## 2014-09-19 DIAGNOSIS — Z720 Tobacco use: Secondary | ICD-10-CM | POA: Diagnosis present

## 2014-09-19 DIAGNOSIS — F1020 Alcohol dependence, uncomplicated: Secondary | ICD-10-CM | POA: Diagnosis present

## 2014-09-19 DIAGNOSIS — I10 Essential (primary) hypertension: Secondary | ICD-10-CM | POA: Diagnosis present

## 2014-09-19 DIAGNOSIS — T502X5A Adverse effect of carbonic-anhydrase inhibitors, benzothiadiazides and other diuretics, initial encounter: Secondary | ICD-10-CM | POA: Diagnosis present

## 2014-09-19 DIAGNOSIS — K7031 Alcoholic cirrhosis of liver with ascites: Principal | ICD-10-CM

## 2014-09-19 DIAGNOSIS — K7460 Unspecified cirrhosis of liver: Secondary | ICD-10-CM | POA: Diagnosis present

## 2014-09-19 DIAGNOSIS — D689 Coagulation defect, unspecified: Secondary | ICD-10-CM | POA: Diagnosis present

## 2014-09-19 DIAGNOSIS — R0600 Dyspnea, unspecified: Secondary | ICD-10-CM | POA: Diagnosis present

## 2014-09-19 DIAGNOSIS — Z794 Long term (current) use of insulin: Secondary | ICD-10-CM | POA: Diagnosis not present

## 2014-09-19 DIAGNOSIS — B1920 Unspecified viral hepatitis C without hepatic coma: Secondary | ICD-10-CM | POA: Diagnosis present

## 2014-09-19 DIAGNOSIS — R188 Other ascites: Secondary | ICD-10-CM | POA: Diagnosis present

## 2014-09-19 DIAGNOSIS — K7290 Hepatic failure, unspecified without coma: Secondary | ICD-10-CM | POA: Diagnosis present

## 2014-09-19 DIAGNOSIS — R739 Hyperglycemia, unspecified: Secondary | ICD-10-CM

## 2014-09-19 DIAGNOSIS — N390 Urinary tract infection, site not specified: Secondary | ICD-10-CM

## 2014-09-19 DIAGNOSIS — B182 Chronic viral hepatitis C: Secondary | ICD-10-CM | POA: Diagnosis present

## 2014-09-19 DIAGNOSIS — E119 Type 2 diabetes mellitus without complications: Secondary | ICD-10-CM

## 2014-09-19 DIAGNOSIS — R059 Cough, unspecified: Secondary | ICD-10-CM

## 2014-09-19 DIAGNOSIS — K7030 Alcoholic cirrhosis of liver without ascites: Secondary | ICD-10-CM | POA: Diagnosis present

## 2014-09-19 DIAGNOSIS — Z880 Allergy status to penicillin: Secondary | ICD-10-CM | POA: Diagnosis not present

## 2014-09-19 DIAGNOSIS — R05 Cough: Secondary | ICD-10-CM

## 2014-09-19 HISTORY — DX: Peptic ulcer, site unspecified, unspecified as acute or chronic, without hemorrhage or perforation: K27.9

## 2014-09-19 HISTORY — DX: Chronic or unspecified gastric ulcer with hemorrhage: K25.4

## 2014-09-19 HISTORY — DX: Acute posthemorrhagic anemia: D62

## 2014-09-19 HISTORY — DX: Reserved for concepts with insufficient information to code with codable children: IMO0002

## 2014-09-19 HISTORY — DX: Spontaneous bacterial peritonitis: K65.2

## 2014-09-19 HISTORY — DX: Constipation, unspecified: K59.00

## 2014-09-19 HISTORY — DX: Helicobacter pylori (H. pylori) as the cause of diseases classified elsewhere: B96.81

## 2014-09-19 LAB — COMPREHENSIVE METABOLIC PANEL
ALBUMIN: 2.4 g/dL — AB (ref 3.5–5.2)
ALT: 36 U/L (ref 0–53)
AST: 81 U/L — AB (ref 0–37)
Alkaline Phosphatase: 189 U/L — ABNORMAL HIGH (ref 39–117)
Anion gap: 6 (ref 5–15)
BUN: <5 mg/dL — ABNORMAL LOW (ref 6–23)
CALCIUM: 8.2 mg/dL — AB (ref 8.4–10.5)
CHLORIDE: 105 mmol/L (ref 96–112)
CO2: 23 mmol/L (ref 19–32)
Creatinine, Ser: 0.66 mg/dL (ref 0.50–1.35)
GFR calc non Af Amer: >90 mL/min (ref >90)
GLUCOSE: 208 mg/dL — AB (ref 70–99)
Potassium: 4.1 mmol/L (ref 3.5–5.1)
SODIUM: 134 mmol/L — AB (ref 135–145)
TOTAL PROTEIN: 7.2 g/dL (ref 6.0–8.3)
Total Bilirubin: 1.3 mg/dL — ABNORMAL HIGH (ref 0.3–1.2)

## 2014-09-19 LAB — PROTIME-INR
INR: 1.52 — ABNORMAL HIGH (ref 0.00–1.49)
Prothrombin Time: 18.4 seconds — ABNORMAL HIGH (ref 11.6–15.2)

## 2014-09-19 LAB — URINALYSIS, ROUTINE W REFLEX MICROSCOPIC
GLUCOSE, UA: 100 mg/dL — AB
HGB URINE DIPSTICK: NEGATIVE
Ketones, ur: NEGATIVE mg/dL
Nitrite: POSITIVE — AB
PROTEIN: 30 mg/dL — AB
Specific Gravity, Urine: 1.017 (ref 1.005–1.030)
Urobilinogen, UA: >8 mg/dL — ABNORMAL HIGH (ref 0.0–1.0)
pH: 6 (ref 5.0–8.0)

## 2014-09-19 LAB — TROPONIN I

## 2014-09-19 LAB — APTT: APTT: 36 s (ref 24–37)

## 2014-09-19 LAB — CBC WITH DIFFERENTIAL/PLATELET
BASOS ABS: 0.1 10*3/uL (ref 0.0–0.1)
Basophils Relative: 2 % — ABNORMAL HIGH (ref 0–1)
Eosinophils Absolute: 0.2 10*3/uL (ref 0.0–0.7)
Eosinophils Relative: 2 % (ref 0–5)
HCT: 36.6 % — ABNORMAL LOW (ref 39.0–52.0)
Hemoglobin: 11.7 g/dL — ABNORMAL LOW (ref 13.0–17.0)
Lymphocytes Relative: 34 % (ref 12–46)
Lymphs Abs: 2.4 10*3/uL (ref 0.7–4.0)
MCH: 20.1 pg — ABNORMAL LOW (ref 26.0–34.0)
MCHC: 32 g/dL (ref 30.0–36.0)
MCV: 63 fL — ABNORMAL LOW (ref 78.0–100.0)
Monocytes Absolute: 1.5 10*3/uL — ABNORMAL HIGH (ref 0.1–1.0)
Monocytes Relative: 21 % — ABNORMAL HIGH (ref 3–12)
NEUTROS PCT: 42 % — AB (ref 43–77)
Neutro Abs: 3 10*3/uL (ref 1.7–7.7)
Platelets: 313 10*3/uL (ref 150–400)
RBC: 5.81 MIL/uL (ref 4.22–5.81)
RDW: 22 % — ABNORMAL HIGH (ref 11.5–15.5)
WBC: 7.2 10*3/uL (ref 4.0–10.5)

## 2014-09-19 LAB — CBG MONITORING, ED: Glucose-Capillary: 181 mg/dL — ABNORMAL HIGH (ref 70–99)

## 2014-09-19 LAB — LACTIC ACID, PLASMA
Lactic Acid, Venous: 2.2 mmol/L (ref 0.5–2.0)
Lactic Acid, Venous: 2.8 mmol/L (ref 0.5–2.0)

## 2014-09-19 LAB — URINE MICROSCOPIC-ADD ON

## 2014-09-19 LAB — BRAIN NATRIURETIC PEPTIDE: B NATRIURETIC PEPTIDE 5: 29.8 pg/mL (ref 0.0–100.0)

## 2014-09-19 LAB — LIPASE, BLOOD: Lipase: 32 U/L (ref 11–59)

## 2014-09-19 LAB — GLUCOSE, CAPILLARY
Glucose-Capillary: 185 mg/dL — ABNORMAL HIGH (ref 70–99)
Glucose-Capillary: 227 mg/dL — ABNORMAL HIGH (ref 70–99)

## 2014-09-19 MED ORDER — ADULT MULTIVITAMIN W/MINERALS CH
1.0000 | ORAL_TABLET | Freq: Every day | ORAL | Status: DC
  Administered 2014-09-19 – 2014-09-21 (×3): 1 via ORAL
  Filled 2014-09-19 (×3): qty 1

## 2014-09-19 MED ORDER — INSULIN DETEMIR 100 UNIT/ML ~~LOC~~ SOLN
8.0000 [IU] | Freq: Every day | SUBCUTANEOUS | Status: DC
  Administered 2014-09-19 – 2014-09-20 (×2): 8 [IU] via SUBCUTANEOUS
  Filled 2014-09-19 (×3): qty 0.08

## 2014-09-19 MED ORDER — SODIUM CHLORIDE 0.9 % IJ SOLN
3.0000 mL | Freq: Two times a day (BID) | INTRAMUSCULAR | Status: DC
  Administered 2014-09-19 – 2014-09-21 (×4): 3 mL via INTRAVENOUS

## 2014-09-19 MED ORDER — LACTULOSE 10 GM/15ML PO SOLN
20.0000 g | Freq: Two times a day (BID) | ORAL | Status: DC | PRN
  Filled 2014-09-19: qty 30

## 2014-09-19 MED ORDER — LORAZEPAM 2 MG/ML IJ SOLN: 1.0000 mg | Freq: Four times a day (QID) | INTRAMUSCULAR | Status: DC | PRN

## 2014-09-19 MED ORDER — ALBUTEROL SULFATE HFA 108 (90 BASE) MCG/ACT IN AERS
4.0000 | INHALATION_SPRAY | Freq: Once | RESPIRATORY_TRACT | Status: AC
  Administered 2014-09-19: 4 via RESPIRATORY_TRACT
  Filled 2014-09-19: qty 6.7

## 2014-09-19 MED ORDER — THIAMINE HCL 100 MG/ML IJ SOLN
100.0000 mg | Freq: Every day | INTRAMUSCULAR | Status: DC
  Filled 2014-09-19 (×3): qty 1

## 2014-09-19 MED ORDER — VITAMIN B-1 100 MG PO TABS
100.0000 mg | ORAL_TABLET | Freq: Every day | ORAL | Status: DC
  Administered 2014-09-19 – 2014-09-21 (×3): 100 mg via ORAL
  Filled 2014-09-19 (×3): qty 1

## 2014-09-19 MED ORDER — POLYETHYLENE GLYCOL 3350 17 G PO PACK
17.0000 g | PACK | Freq: Every day | ORAL | Status: DC
  Administered 2014-09-19 – 2014-09-21 (×3): 17 g via ORAL
  Filled 2014-09-19 (×3): qty 1

## 2014-09-19 MED ORDER — ACETAMINOPHEN 325 MG PO TABS: 650.0000 mg | ORAL_TABLET | Freq: Four times a day (QID) | ORAL | Status: DC | PRN

## 2014-09-19 MED ORDER — HYDROCODONE-HOMATROPINE 5-1.5 MG/5ML PO SYRP
5.0000 mL | ORAL_SOLUTION | ORAL | Status: DC | PRN
  Administered 2014-09-20: 5 mL via ORAL
  Filled 2014-09-19: qty 5

## 2014-09-19 MED ORDER — GABAPENTIN 300 MG PO CAPS
600.0000 mg | ORAL_CAPSULE | Freq: Three times a day (TID) | ORAL | Status: DC
  Administered 2014-09-19 – 2014-09-21 (×5): 600 mg via ORAL
  Filled 2014-09-19 (×6): qty 2

## 2014-09-19 MED ORDER — ACETAMINOPHEN 650 MG RE SUPP: 650.0000 mg | Freq: Four times a day (QID) | RECTAL | Status: DC | PRN

## 2014-09-19 MED ORDER — SPIRONOLACTONE 100 MG PO TABS
100.0000 mg | ORAL_TABLET | Freq: Every day | ORAL | Status: DC
  Administered 2014-09-20 – 2014-09-21 (×2): 100 mg via ORAL
  Filled 2014-09-19 (×2): qty 1

## 2014-09-19 MED ORDER — PANTOPRAZOLE SODIUM 40 MG PO TBEC
40.0000 mg | DELAYED_RELEASE_TABLET | Freq: Two times a day (BID) | ORAL | Status: DC
  Administered 2014-09-19 – 2014-09-21 (×4): 40 mg via ORAL
  Filled 2014-09-19 (×4): qty 1

## 2014-09-19 MED ORDER — INSULIN ASPART 100 UNIT/ML ~~LOC~~ SOLN: 0.0000 [IU] | Freq: Three times a day (TID) | SUBCUTANEOUS | Status: DC

## 2014-09-19 MED ORDER — LORAZEPAM 1 MG PO TABS: 1.0000 mg | ORAL_TABLET | Freq: Four times a day (QID) | ORAL | Status: DC | PRN

## 2014-09-19 MED ORDER — DEXTROSE 5 % IV SOLN
1.0000 g | Freq: Once | INTRAVENOUS | Status: AC
  Administered 2014-09-19: 1 g via INTRAVENOUS
  Filled 2014-09-19: qty 10

## 2014-09-19 MED ORDER — ONDANSETRON HCL 4 MG PO TABS: 4.0000 mg | ORAL_TABLET | Freq: Four times a day (QID) | ORAL | Status: DC | PRN

## 2014-09-19 MED ORDER — INSULIN ASPART 100 UNIT/ML ~~LOC~~ SOLN
0.0000 [IU] | Freq: Three times a day (TID) | SUBCUTANEOUS | Status: DC
  Administered 2014-09-19: 2 [IU] via SUBCUTANEOUS
  Administered 2014-09-20: 1 [IU] via SUBCUTANEOUS
  Administered 2014-09-20 (×2): 2 [IU] via SUBCUTANEOUS

## 2014-09-19 MED ORDER — PROPRANOLOL HCL 10 MG PO TABS
10.0000 mg | ORAL_TABLET | Freq: Three times a day (TID) | ORAL | Status: DC
  Administered 2014-09-19 – 2014-09-21 (×5): 10 mg via ORAL
  Filled 2014-09-19 (×6): qty 1

## 2014-09-19 MED ORDER — MENTHOL 3 MG MT LOZG
1.0000 | LOZENGE | OROMUCOSAL | Status: DC | PRN
  Administered 2014-09-20: 3 mg via ORAL
  Filled 2014-09-19: qty 9

## 2014-09-19 MED ORDER — IPRATROPIUM-ALBUTEROL 0.5-2.5 (3) MG/3ML IN SOLN
3.0000 mL | Freq: Four times a day (QID) | RESPIRATORY_TRACT | Status: DC
Start: 2014-09-19 — End: 2014-09-20
  Administered 2014-09-19 – 2014-09-20 (×5): 3 mL via RESPIRATORY_TRACT
  Filled 2014-09-19 (×5): qty 3

## 2014-09-19 MED ORDER — RIFAXIMIN 550 MG PO TABS
550.0000 mg | ORAL_TABLET | Freq: Two times a day (BID) | ORAL | Status: DC
  Administered 2014-09-19 – 2014-09-21 (×4): 550 mg via ORAL
  Filled 2014-09-19 (×5): qty 1

## 2014-09-19 MED ORDER — FOLIC ACID 1 MG PO TABS
1.0000 mg | ORAL_TABLET | Freq: Every day | ORAL | Status: DC
  Administered 2014-09-19 – 2014-09-21 (×3): 1 mg via ORAL
  Filled 2014-09-19 (×3): qty 1

## 2014-09-19 MED ORDER — FUROSEMIDE 40 MG PO TABS
40.0000 mg | ORAL_TABLET | Freq: Two times a day (BID) | ORAL | Status: DC
  Administered 2014-09-20 – 2014-09-21 (×3): 40 mg via ORAL
  Filled 2014-09-19 (×3): qty 1

## 2014-09-19 MED ORDER — FUROSEMIDE 10 MG/ML IJ SOLN
60.0000 mg | Freq: Once | INTRAMUSCULAR | Status: AC
  Administered 2014-09-19: 60 mg via INTRAVENOUS
  Filled 2014-09-19: qty 8

## 2014-09-19 MED ORDER — CEFTRIAXONE SODIUM IN DEXTROSE 20 MG/ML IV SOLN
1.0000 g | INTRAVENOUS | Status: DC
  Administered 2014-09-20: 1 g via INTRAVENOUS
  Filled 2014-09-19: qty 50

## 2014-09-19 MED ORDER — POLYVINYL ALCOHOL 1.4 % OP SOLN
1.0000 [drp] | Freq: Every day | OPHTHALMIC | Status: DC | PRN
  Filled 2014-09-19: qty 15

## 2014-09-19 MED ORDER — ONDANSETRON HCL 4 MG/2ML IJ SOLN: 4.0000 mg | Freq: Four times a day (QID) | INTRAMUSCULAR | Status: DC | PRN

## 2014-09-19 MED ORDER — NORTRIPTYLINE HCL 25 MG PO CAPS
50.0000 mg | ORAL_CAPSULE | Freq: Every day | ORAL | Status: DC
  Administered 2014-09-19 – 2014-09-20 (×2): 50 mg via ORAL
  Filled 2014-09-19 (×2): qty 2

## 2014-09-19 MED ORDER — PHENOL 1.4 % MT LIQD
1.0000 | OROMUCOSAL | Status: DC | PRN
  Administered 2014-09-20: 1 via OROMUCOSAL
  Filled 2014-09-19: qty 177

## 2014-09-19 NOTE — H&P (Signed)
History and Physical  Curtis Estrada ZOX:[id number] DOB: 14-Sep-1955 DOA: 09/19/2014   PCP: [facility]   Chief Complaint: Shortness of breath, abdominal distention, leg edema  HPI:  59 year old male with a history of cirrhosis secondary to alcohol and chronic hepatitis C, diabetes mellitus, hypertension, and coagulopathy presents with one-month history of worsening leg edema, shortness of breath and abdominal distention. The patient denies any fevers or chills, but states that he has been coughing for the better part of one month without any hemoptysis. He had one episode of nausea and emesis on the day prior to admission without any blood. There was no coffee grounds in his emesis. He denies any chest pain but complains of some shortness of breath. He also complains of abdominal pain associated with his increasing abdominal girth. He denies any diarrhea, hematochezia, melena. Apparently, the patient has been around his sick nephew who has had URI type symptoms. Unfortunately, the patient continues to drink 3 x 40 oz beers per day. He has been drinking for at least the last 40 years. He continues to smoke 40 claims to be one cigarette per day.  Notably, the patient states that he was taken off of his diuretics, furosemide and spironolactone about 2 months ago by his [place] physician. He endorsed compliance with all his other medications. However, when asked about his medications the patient appears to have poor insight regarding what medication he takes and for purpose. In the emergency department, the patient was hemodynamically stable and afebrile. He was mild tachycardic with heart rate 110-118, with oxygen saturation 94-95 percent on room air. The patient was given furosemide 60 mg IV 1 in the emergency department. Lipase was 32 Assessment/Plan: Decompensated liver cirrhosis with large volume ascites -Schedule US paracentesis with IR -Send ascites for cell count and culture as the patient  has some abdominal pain -Restart diuretics and monitor renal function and electrolytes  -continue propranolol -Check coags -HIV antibody Dyspnea  -The major issue may be the patient's ascites pushing on his diaphragm  -Check EKG  -Echocardiogram  alcohol dependence -Alcohol withdrawal protocol -Cessation discussed Tobacco abuse -Suspect the patient has underlying COPD -Duo nebs when necessary shortness of breath and coughing Diabetes mellitus type 2 -Hemoglobin A1c -Half home dose Levemir  -NovoLog sliding scale  Lower extremity edema  -Diuretics as discussed  -Venous duplex         Past Medical History  Diagnosis Date  . Hypertension   . Diabetes mellitus without complication   . HCV (hepatitis C virus)   . 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 ETOH abuse   . Ascites 01/2013  . Hematoma 01/2013    posterior right flank from a fall  . 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   . Anemia 02/2013  . Hyponatremia 01/2013   Past Surgical History  Procedure Laterality Date  . Hernia repair    . Tonsillectomy    . Esophagogastroduodenoscopy N/A 03/03/2013    Procedure: ESOPHAGOGASTRODUODENOSCOPY (EGD);  Surgeon: Beverley Fiedler, MD;  Location: [facility] ENDOSCOPY;  Service: Gastroenterology;  Laterality: N/A;  Bedside   Social History:  reports that he has been smoking Cigarettes.  He has a 11.25 pack-year smoking history. He has never used smokeless tobacco. He reports that he drinks alcohol. He reports that he does not use illicit drugs.   Family History  Problem Relation Age of Onset  . Dementia Mother   . Cancer -  Other Father      Allergies  Allergen Reactions  . Penicillins     childhood      Prior to Admission medications   Medication Sig Start Date End Date Taking? Authorizing Provider  albuterol (PROVENTIL HFA;VENTOLIN HFA) 108 (90 BASE) MCG/ACT inhaler Inhale 2 puffs into the lungs every 2 (two) hours as needed for wheezing or shortness of  breath (or coughing). 05/30/14  Yes Dione Boozeavid Glick, MD  gabapentin (NEURONTIN) 300 MG capsule Take 600 mg by mouth 3 (three) times daily.   Yes Historical Provider, MD  insulin aspart (NOVOLOG) 100 UNIT/ML injection Inject 0-6 Units into the skin 3 (three) times daily with meals. 0-6 Units, Subcutaneous, 3 times daily with meals  CBG < 70: implement hypoglycemia protocol; CBG 70 - 120: 0 units; CBG 121 - 150: 1 unit; CBG 151 - 200: 2 units; CBG 201 - 250: 3 units; CBG 251 - 300: 5 units; CBG 301 - 350: 7 units; CBG 351 - 400: 9 units; CBG > 400: call MD 03/10/13  Yes Shanker Levora DredgeM Ghimire, MD  insulin detemir (LEVEMIR) 100 UNIT/ML injection Inject 16 Units into the skin at bedtime. 03/10/13  Yes Shanker Levora DredgeM Ghimire, MD  lactulose (CHRONULAC) 10 GM/15ML solution Take 20 g by mouth 2 (two) times daily as needed for mild constipation.   Yes Historical Provider, MD  Magnesium Oxide 420 MG TABS Take 1 tablet by mouth daily.   Yes Historical Provider, MD  metFORMIN (GLUCOPHAGE) 1000 MG tablet Take 1,000 mg by mouth 2 (two) times daily with a meal.   Yes Historical Provider, MD  nortriptyline (PAMELOR) 50 MG capsule Take 50 mg by mouth at bedtime.   Yes Historical Provider, MD  pantoprazole (PROTONIX) 40 MG tablet Take 40 mg by mouth 2 (two) times daily.    Yes Historical Provider, MD  polyethylene glycol (MIRALAX / GLYCOLAX) packet Take 17 g by mouth daily.   Yes Historical Provider, MD  polyvinyl alcohol (LIQUIFILM TEARS) 1.4 % ophthalmic solution Place 1 drop into both eyes daily as needed (dry eyes).    Yes Historical Provider, MD  propranolol (INDERAL) 10 MG tablet Take 1 tablet (10 mg total) by mouth 3 (three) times daily. 03/10/13  Yes Shanker Levora DredgeM Ghimire, MD  sildenafil (VIAGRA) 100 MG tablet Take 100 mg by mouth daily as needed for erectile dysfunction.   Yes Historical Provider, MD  furosemide (LASIX) 40 MG tablet Take 1 tablet (40 mg total) by mouth 2 (two) times daily. Patient not taking: Reported on 05/30/2014  03/10/13   Maretta BeesShanker M Ghimire, MD  levofloxacin (LEVAQUIN) 500 MG tablet Take 1 tablet (500 mg total) by mouth daily. Patient not taking: Reported on 09/19/2014 05/30/14   Dione Boozeavid Glick, MD  predniSONE (DELTASONE) 50 MG tablet Take 1 tablet (50 mg total) by mouth daily. Patient not taking: Reported on 09/19/2014 05/30/14   Dione Boozeavid Glick, MD  rifaximin (XIFAXAN) 550 MG TABS tablet Take 1 tablet (550 mg total) by mouth 2 (two) times daily at 10 AM and 5 PM. Patient not taking: Reported on 05/30/2014 03/10/13   Maretta BeesShanker M Ghimire, MD  spironolactone (ALDACTONE) 100 MG tablet Take 1 tablet (100 mg total) by mouth daily. Patient not taking: Reported on 05/30/2014 03/10/13   Maretta BeesShanker M Ghimire, MD    Review of Systems:  Constitutional:  No weight loss, night sweats, Fevers, chills Head&Eyes: No headache.  No vision loss.  No eye pain or scotoma ENT:  No Difficulty swallowing,Tooth/dental problems,Sore throat,  Cardio-vascular:  No chest pain, Orthopnea, PND,   dizziness, palpitations  GI:  No   diarrhea, loss of appetite, hematochezia, melena, heartburn, indigestion, Resp:  No No coughing up of blood .No wheezing.No chest wall deformity  Skin:  no rash or lesions.  GU:  no dysuria, change in color of urine, no urgency or frequency. No flank pain.  Musculoskeletal:  NoNo decreased range of motion. No back pain.  Psych:  No change in mood or affect. No depression or anxiety. Neurologic: No headache, no dysesthesia, no focal weakness, no vision loss. No syncope  Physical Exam: Filed Vitals:   09/19/14 1134 09/19/14 1305 09/19/14 1412 09/19/14 1514  BP: 142/97 136/88 112/79 143/99  Pulse: 116 112 118 115  Temp:   98.5 F (36.9 C)   TempSrc:   Oral   Resp: SpO2: 93% 92% 93% 92%   General:  A&O x 3, NAD, nontoxic, pleasant/cooperative Head/Eye: No conjunctival hemorrhage, no icterus, [place]/AT, No nystagmus ENT:  No icterus,  No thrush, edentulous., no pharyngeal exudate Neck:  No  masses, no lymphadenpathy, no bruits CV:  RRR, no rub, no gallop, no S3 Lung:  Bilateral scattered crackles. No wheezing. Good air movement. Abdomen: soft/epigastric discomfort. No rebound., +BS, +distended, no peritoneal signs Ext: No cyanosis, No rashes, No petechiae, No lymphangitis, 2+ LE edema Neuro: CNII-XII intact, strength 4/5 in bilateral upper and lower extremities,   Labs on Admission:  Basic Metabolic Panel:  Recent Labs Lab 09/19/14 0914  NA 134*  K 4.1  CL 105  CO2 23  GLUCOSE 208*  BUN <5*  CREATININE 0.66  CALCIUM 8.2*   Liver Function Tests:  Recent Labs Lab 09/19/14 0914  AST 81*  ALT 36  ALKPHOS 189*  BILITOT 1.3*  PROT 7.2  ALBUMIN 2.4*    Recent Labs Lab 09/19/14 0914  LIPASE 32   No results for input(s): AMMONIA in the last 168 hours. CBC:  Recent Labs Lab 09/19/14 0914  WBC 7.2  NEUTROABS 3.0  HGB 11.7*  HCT 36.6*  MCV 63.0*  PLT 313   Cardiac Enzymes:  Recent Labs Lab 09/19/14 1000  TROPONINI <0.03   BNP: Invalid input(s): POCBNP CBG:  Recent Labs Lab 09/19/14 0918  GLUCAP 181*    Radiological Exams on Admission: Dg Chest 2 View  09/19/2014   CLINICAL DATA:  59 year old male with 1 month history of cough and congestion with recently progressive shortness of breath.  EXAM: CHEST  2 VIEW  COMPARISON:  Prior chest x-ray 05/30/2014  FINDINGS: Cardiac and mediastinal contours are within normal limits. Trace atherosclerotic calcification present in the transverse aorta. Central bronchitic changes similar compared to prior. There may be slightly increased perihilar subsegmental atelectasis bilaterally. Normal inspiratory volumes. No focal airspace consolidation, pleural effusion, pulmonary edema or pneumothorax. No acute osseous abnormality.  IMPRESSION: 1. Slightly increased bilateral perihilar subsegmental atelectasis. 2. Otherwise, stable chest x-ray with mild chronic bronchitic change. 3. Trace aortic atherosclerosis.    Electronically Signed   By: Malachy Moan M.D.   On: 09/19/2014 10:23    EKG: Independently reviewed. pending    Time spent:70 minutes Code Status:   FULL Family Communication:   Sister updated at bedside   Emmanuelle Coxe, DO  [facility] Pager [phone]  If 7PM-7AM, please contact night-coverage [email or web address] Password [facility] 09/19/2014, 3:48 PM

## 2014-09-19 NOTE — ED Notes (Signed)
Pt reports abd distention and nonproductive cough for a month. Pt reports small amt of blood from cough irritation and one episode of vomiting yesterday.

## 2014-09-19 NOTE — ED Provider Notes (Signed)
CSN: [id number]     Arrival date & time 09/19/14  4098 History   First MD Initiated Contact with Patient 09/19/14 [phone]     Chief Complaint  Patient presents with  . Cough  . Bloated     (Consider location/radiation/quality/duration/timing/severity/associated sxs/prior Treatment) Patient is a 59 y.o. male presenting with cough.  Cough Cough characteristics:  Dry Severity:  Moderate Onset quality:  Gradual Duration:  4 weeks Timing:  Constant Progression:  Worsening Context comment:  History of cirrhosis. Relieved by:  Nothing Worsened by:  Nothing tried Associated symptoms: shortness of breath (particularly when lying flat)   Associated symptoms: no chest pain and no fever   Associated symptoms comment:  Abdominal distension.  Lower extremity swelling.    Past Medical History  Diagnosis Date  . Hypertension   . Diabetes mellitus without complication   . HCV (hepatitis C virus)   . 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 ETOH abuse   . Ascites 01/2013  . Hematoma 01/2013    posterior right flank from a fall  . 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   . Anemia 02/2013  . Hyponatremia 01/2013   Past Surgical History  Procedure Laterality Date  . Hernia repair    . Tonsillectomy    . Esophagogastroduodenoscopy N/A 03/03/2013    Procedure: ESOPHAGOGASTRODUODENOSCOPY (EGD);  Surgeon: Beverley Fiedler, MD;  Location: [facility] ENDOSCOPY;  Service: Gastroenterology;  Laterality: N/A;  Bedside   Family History  Problem Relation Age of Onset  . Dementia Mother   . Cancer - Other Father    History  Substance Use Topics  . Smoking status: Current Every Day Smoker -- 0.25 packs/day for 45 years    Types: Cigarettes  . Smokeless tobacco: Never Used  . Alcohol Use: Yes     Comment: Three 40 oz cans per day, has drank since age 59    Review of Systems  Constitutional: Negative for fever.  Respiratory: Positive for cough and shortness of breath (particularly when lying  flat).   Cardiovascular: Negative for chest pain.  All other systems reviewed and are negative.     Allergies  Penicillins  Home Medications   Prior to Admission medications   Medication Sig Start Date End Date Taking? Authorizing Provider  albuterol (PROVENTIL HFA;VENTOLIN HFA) 108 (90 BASE) MCG/ACT inhaler Inhale 2 puffs into the lungs every 2 (two) hours as needed for wheezing or shortness of breath (or coughing). 05/30/14  Yes Dione Booze, MD  gabapentin (NEURONTIN) 300 MG capsule Take 600 mg by mouth 3 (three) times daily.   Yes Historical Provider, MD  insulin aspart (NOVOLOG) 100 UNIT/ML injection Inject 0-6 Units into the skin 3 (three) times daily with meals. 0-6 Units, Subcutaneous, 3 times daily with meals  CBG < 70: implement hypoglycemia protocol; CBG 70 - 120: 0 units; CBG 121 - 150: 1 unit; CBG 151 - 200: 2 units; CBG 201 - 250: 3 units; CBG 251 - 300: 5 units; CBG 301 - 350: 7 units; CBG 351 - 400: 9 units; CBG > 400: call MD 03/10/13  Yes Shanker Levora Dredge, MD  insulin detemir (LEVEMIR) 100 UNIT/ML injection Inject 16 Units into the skin at bedtime. 03/10/13  Yes Shanker Levora Dredge, MD  lactulose (CHRONULAC) 10 GM/15ML solution Take 20 g by mouth 2 (two) times daily as needed for mild constipation.   Yes Historical Provider, MD  Magnesium Oxide 420 MG TABS Take 1 tablet by  mouth daily.   Yes Historical Provider, MD  metFORMIN (GLUCOPHAGE) 1000 MG tablet Take 1,000 mg by mouth 2 (two) times daily with a meal.   Yes Historical Provider, MD  nortriptyline (PAMELOR) 50 MG capsule Take 50 mg by mouth at bedtime.   Yes Historical Provider, MD  pantoprazole (PROTONIX) 40 MG tablet Take 40 mg by mouth 2 (two) times daily.    Yes Historical Provider, MD  polyethylene glycol (MIRALAX / GLYCOLAX) packet Take 17 g by mouth daily.   Yes Historical Provider, MD  polyvinyl alcohol (LIQUIFILM TEARS) 1.4 % ophthalmic solution Place 1 drop into both eyes daily as needed (dry eyes).    Yes  Historical Provider, MD  propranolol (INDERAL) 10 MG tablet Take 1 tablet (10 mg total) by mouth 3 (three) times daily. 03/10/13  Yes Shanker Levora Dredge, MD  sildenafil (VIAGRA) 100 MG tablet Take 100 mg by mouth daily as needed for erectile dysfunction.   Yes Historical Provider, MD  furosemide (LASIX) 40 MG tablet Take 1 tablet (40 mg total) by mouth 2 (two) times daily. Patient not taking: Reported on 05/30/2014 03/10/13   Maretta Bees, MD  levofloxacin (LEVAQUIN) 500 MG tablet Take 1 tablet (500 mg total) by mouth daily. Patient not taking: Reported on 09/19/2014 05/30/14   Dione Booze, MD  predniSONE (DELTASONE) 50 MG tablet Take 1 tablet (50 mg total) by mouth daily. Patient not taking: Reported on 09/19/2014 05/30/14   Dione Booze, MD  rifaximin (XIFAXAN) 550 MG TABS tablet Take 1 tablet (550 mg total) by mouth 2 (two) times daily at 10 AM and 5 PM. Patient not taking: Reported on 05/30/2014 03/10/13   Maretta Bees, MD  spironolactone (ALDACTONE) 100 MG tablet Take 1 tablet (100 mg total) by mouth daily. Patient not taking: Reported on 05/30/2014 03/10/13   Maretta Bees, MD   BP 143/99 mmHg  Pulse 115  Temp(Src) 98.5 F (36.9 C) (Oral)  Resp 20  SpO2 92% Physical Exam  Constitutional: He is oriented to person, place, and time. He appears well-developed and well-nourished. No distress.  HENT:  Head: Normocephalic and atraumatic.  Mouth/Throat: Oropharynx is clear and moist.  Eyes: Conjunctivae are normal. Pupils are equal, round, and reactive to light. No scleral icterus.  Neck: Neck supple.  Cardiovascular: Regular rhythm, normal heart sounds and intact distal pulses.  Tachycardia present.   No murmur heard. Pulmonary/Chest: Breath sounds normal. No stridor. Tachypnea noted. No respiratory distress. He has no wheezes. He has no rales.  Abdominal: Soft. He exhibits distension. There is no tenderness.  Musculoskeletal: Normal range of motion. He exhibits edema (2+ BLE).   Neurological: He is alert and oriented to person, place, and time.  Skin: Skin is warm and dry. No rash noted.  Psychiatric: He has a normal mood and affect. His behavior is normal.  Nursing note and vitals reviewed.   ED Course  Procedures (including critical care time) Labs Review Labs Reviewed  CBC WITH DIFFERENTIAL/PLATELET - Abnormal; Notable for the following:    Hemoglobin 11.7 (*)    HCT 36.6 (*)    MCV 63.0 (*)    MCH 20.1 (*)    RDW 22.0 (*)    Neutrophils Relative % 42 (*)    Monocytes Relative 21 (*)    Monocytes Absolute 1.5 (*)    Basophils Relative 2 (*)    All other components within normal limits  COMPREHENSIVE METABOLIC PANEL - Abnormal; Notable for the following:    Sodium  134 (*)    Glucose, Bld 208 (*)    BUN <5 (*)    Calcium 8.2 (*)    Albumin 2.4 (*)    AST 81 (*)    Alkaline Phosphatase 189 (*)    Total Bilirubin 1.3 (*)    All other components within normal limits  URINALYSIS, ROUTINE W REFLEX MICROSCOPIC - Abnormal; Notable for the following:    Color, Urine AMBER (*)    Glucose, UA 100 (*)    Bilirubin Urine SMALL (*)    Protein, ur 30 (*)    Urobilinogen, UA >8.0 (*)    Nitrite POSITIVE (*)    Leukocytes, UA MODERATE (*)    All other components within normal limits  URINE MICROSCOPIC-ADD ON - Abnormal; Notable for the following:    Bacteria, UA MANY (*)    All other components within normal limits  CBG MONITORING, ED - Abnormal; Notable for the following:    Glucose-Capillary 181 (*)    All other components within normal limits  LIPASE, BLOOD  TROPONIN I    Imaging Review Dg Chest 2 View  09/19/2014   CLINICAL DATA:  59 year old male with 1 month history of cough and congestion with recently progressive shortness of breath.  EXAM: CHEST  2 VIEW  COMPARISON:  Prior chest x-ray 05/30/2014  FINDINGS: Cardiac and mediastinal contours are within normal limits. Trace atherosclerotic calcification present in the transverse aorta. Central  bronchitic changes similar compared to prior. There may be slightly increased perihilar subsegmental atelectasis bilaterally. Normal inspiratory volumes. No focal airspace consolidation, pleural effusion, pulmonary edema or pneumothorax. No acute osseous abnormality.  IMPRESSION: 1. Slightly increased bilateral perihilar subsegmental atelectasis. 2. Otherwise, stable chest x-ray with mild chronic bronchitic change. 3. Trace aortic atherosclerosis.   Electronically Signed   By: Malachy MoanHeath  McCullough M.D.   On: 09/19/2014 10:23     EKG Interpretation None      Error with MUSE EKG - Sinus tachycardia, rate 121, normal axis, normal intervals, inferior q waves, similar to prior.  MDM   Final diagnoses:  Cough  Shortness of breath  UTI (lower urinary tract infection)    59 yo male with cirrhosis who presents with abdominal distension and SOB.  He has slightly increased WOB and tachycardia.  I suspect dyspnea is from increased ascites, which is likely from him not taking diuretics.  He was also found to have a UTI.  Treated with lasix and ceftriaxone.  Will be admitted to internal medicine.     Blake DivineJohn Jamauri Kruzel, MD 09/19/14 [phone]

## 2014-09-19 NOTE — ED Notes (Signed)
Pt's visitor stated to me in hallway that pt has Hep C and been drinking a lot here lately, causing his swelling to get worse.

## 2014-09-19 NOTE — ED Notes (Signed)
Wofford MD notified of bladder scan results; per MD will come see pt and put additional orders in as needed.

## 2014-09-19 NOTE — ED Notes (Signed)
Pt c/o cough and abd distention x 1 month. Pt states that it's "normal spit" when he coughs.  Pt been taking cough drops. Pt c/o n/v/d x 1 week.

## 2014-09-19 NOTE — ED Notes (Signed)
Hospitalist at bedside 

## 2014-09-19 NOTE — ED Notes (Signed)
Pt request to take at home insulin; Wofford MD verbalized HOLD insulin at present time and will reassess pt shortly.

## 2014-09-19 NOTE — ED Notes (Signed)
Per Wofford MD additional orders to be placed shortly to treat urine results. Pt aware.

## 2014-09-19 NOTE — ED Notes (Signed)
CBG 181.  

## 2014-09-19 NOTE — Progress Notes (Signed)
CRITICAL VALUE ALERT  Critical value received:  Lactic acid 2.2  Date of notification:  09/19/2014  Time of notification:  2205  Critical value read back:Yes.    Nurse who received alert:  L. Vergel de Lucy Chrisios RN  MD notified (1st page):  Merdis DelayK. Schorr NP  Time of first page:  2207  MD notified (2nd page):  Time of second page:  Responding MD:  Merdis DelayK. Schorr NP  Time MD responded:  [phone]

## 2014-09-19 NOTE — ED Notes (Signed)
Pt attempt to urinate unsuccessful.

## 2014-09-20 ENCOUNTER — Inpatient Hospital Stay (HOSPITAL_COMMUNITY): Payer: Non-veteran care

## 2014-09-20 DIAGNOSIS — K652 Spontaneous bacterial peritonitis: Secondary | ICD-10-CM

## 2014-09-20 DIAGNOSIS — R0600 Dyspnea, unspecified: Secondary | ICD-10-CM

## 2014-09-20 DIAGNOSIS — R0602 Shortness of breath: Secondary | ICD-10-CM

## 2014-09-20 HISTORY — DX: Spontaneous bacterial peritonitis: K65.2

## 2014-09-20 LAB — CBC
HCT: 34.8 % — ABNORMAL LOW (ref 39.0–52.0)
Hemoglobin: 10.5 g/dL — ABNORMAL LOW (ref 13.0–17.0)
MCH: 19.3 pg — ABNORMAL LOW (ref 26.0–34.0)
MCHC: 30.2 g/dL (ref 30.0–36.0)
MCV: 63.9 fL — ABNORMAL LOW (ref 78.0–100.0)
Platelets: 260 K/uL (ref 150–400)
RBC: 5.45 MIL/uL (ref 4.22–5.81)
RDW: 21.5 % — ABNORMAL HIGH (ref 11.5–15.5)
WBC: 7.7 K/uL (ref 4.0–10.5)

## 2014-09-20 LAB — COMPREHENSIVE METABOLIC PANEL WITH GFR
ALT: 30 U/L (ref 0–53)
AST: 65 U/L — ABNORMAL HIGH (ref 0–37)
Albumin: 2.1 g/dL — ABNORMAL LOW (ref 3.5–5.2)
Alkaline Phosphatase: 171 U/L — ABNORMAL HIGH (ref 39–117)
Anion gap: 5 (ref 5–15)
BUN: 5 mg/dL — ABNORMAL LOW (ref 6–23)
CO2: 27 mmol/L (ref 19–32)
Calcium: 8.2 mg/dL — ABNORMAL LOW (ref 8.4–10.5)
Chloride: 103 mmol/L (ref 96–112)
Creatinine, Ser: 0.78 mg/dL (ref 0.50–1.35)
GFR calc Af Amer: >90 mL/min
GFR calc non Af Amer: >90 mL/min
Glucose, Bld: 164 mg/dL — ABNORMAL HIGH (ref 70–99)
Potassium: 3.5 mmol/L (ref 3.5–5.1)
Sodium: 135 mmol/L (ref 135–145)
Total Bilirubin: 1.3 mg/dL — ABNORMAL HIGH (ref 0.3–1.2)
Total Protein: 6.3 g/dL (ref 6.0–8.3)

## 2014-09-20 LAB — GLUCOSE, CAPILLARY
GLUCOSE-CAPILLARY: 154 mg/dL — AB (ref 70–99)
GLUCOSE-CAPILLARY: 187 mg/dL — AB (ref 70–99)
Glucose-Capillary: 136 mg/dL — ABNORMAL HIGH (ref 70–99)
Glucose-Capillary: 217 mg/dL — ABNORMAL HIGH (ref 70–99)

## 2014-09-20 LAB — BODY FLUID CELL COUNT WITH DIFFERENTIAL
LYMPHS FL: 48 %
MONOCYTE-MACROPHAGE-SEROUS FLUID: 50 % (ref 50–90)
NEUTROPHIL FLUID: 2 % (ref 0–25)
WBC FLUID: 568 uL (ref 0–1000)

## 2014-09-20 LAB — GLUCOSE, SEROUS FLUID: Glucose, Fluid: 173 mg/dL

## 2014-09-20 LAB — HEPATITIS B SURFACE ANTIGEN: Hepatitis B Surface Ag: NEGATIVE

## 2014-09-20 MED ORDER — ALBUTEROL SULFATE (2.5 MG/3ML) 0.083% IN NEBU: 2.5000 mg | INHALATION_SOLUTION | Freq: Four times a day (QID) | RESPIRATORY_TRACT | Status: DC | PRN

## 2014-09-20 MED ORDER — LEVOFLOXACIN 750 MG PO TABS
750.0000 mg | ORAL_TABLET | Freq: Every day | ORAL | Status: DC
  Administered 2014-09-20: 750 mg via ORAL
  Filled 2014-09-20: qty 1

## 2014-09-20 MED ORDER — METRONIDAZOLE 500 MG PO TABS
500.0000 mg | ORAL_TABLET | Freq: Three times a day (TID) | ORAL | Status: DC
  Administered 2014-09-20 – 2014-09-21 (×2): 500 mg via ORAL
  Filled 2014-09-20 (×4): qty 1

## 2014-09-20 MED ORDER — IPRATROPIUM-ALBUTEROL 0.5-2.5 (3) MG/3ML IN SOLN
3.0000 mL | Freq: Two times a day (BID) | RESPIRATORY_TRACT | Status: DC
  Administered 2014-09-21: 3 mL via RESPIRATORY_TRACT
  Filled 2014-09-20: qty 3

## 2014-09-20 MED ORDER — ALBUMIN HUMAN 25 % IV SOLN
50.0000 g | Freq: Once | INTRAVENOUS | Status: AC
  Administered 2014-09-20: 50 g via INTRAVENOUS
  Filled 2014-09-20: qty 200

## 2014-09-20 MED ORDER — LACTULOSE 10 GM/15ML PO SOLN
20.0000 g | Freq: Two times a day (BID) | ORAL | Status: DC
  Administered 2014-09-20 – 2014-09-21 (×2): 20 g via ORAL
  Filled 2014-09-20 (×2): qty 30

## 2014-09-20 NOTE — Progress Notes (Signed)
PROGRESS NOTE  Curtis Estrada ZOX:[id number] DOB: [birth year] DOA: 09/19/2014 PCP: [facility]  Brief history 59 year old male with a history of cirrhosis secondary to alcohol and chronic hepatitis C, diabetes mellitus, hypertension, and coagulopathy presents with one-month history of worsening leg edema, shortness of breath and abdominal distention. The patient denies any fevers or chills, but states that he has been coughing for the better part of one month without any hemoptysis. Examination revealed that the patient had significant ascites. He wanted ultrasound-guided paracentesis on 09/20/2014 removing 4.1 L. There was significant symptomatic improvement after the paracentesis. Assessment/Plan: Decompensated liver cirrhosis with large volume ascites -09/20/14--US paracentesis with IR--4.1L removed -Send ascites for cell count and culture as the patient has some abdominal pain -Restart diuretics and monitor renal function and electrolytes  -continue propranolol -Mildly coagulopathic INR 1.52, PTT 36 -HIV antibody--pending -Albumin 50 g given after paracentesis -Restart lactulose and rifaximin Spontaneous bacterial Peritonitis -The patient had abdominal pain at the time of presentation -09/20/2014 paracentesis--4.1 L removed -WBC 568 from ascites suggesting infectious process--unfortunately, the patient had received ceftriaxone which likely compromises his cultures -Start levofloxacin and Flagyl  -Lactic acid 2.2 Dyspnea  -The major issue may be the patient's ascites pushing on his diaphragm  -Improved after paracentesis  -Check EKG--sinus tachycardia, no ST or T-wave change -- -Echocardiogram-EF 55-60%, grade 1 diastolic dysfunction, normal RV  alcohol dependence -Alcohol withdrawal protocol -Cessation discussed Tobacco abuse -Suspect the patient has underlying COPD -Duo nebs when necessary shortness of breath and coughing Diabetes mellitus type 2 -Hemoglobin  A1c -Half home dose Levemir  -NovoLog sliding scale  Lower extremity edema  -Diuretics as discussed  -Venous duplex--neg  Family Communication:   Sister updated 3/8 Disposition Plan:   Home 1-2 days       Procedures/Studies: Dg Chest 2 View  09/19/2014   CLINICAL DATA:  59 year old male with 1 month history of cough and congestion with recently progressive shortness of breath.  EXAM: CHEST  2 VIEW  COMPARISON:  Prior chest x-ray 05/30/2014  FINDINGS: Cardiac and mediastinal contours are within normal limits. Trace atherosclerotic calcification present in the transverse aorta. Central bronchitic changes similar compared to prior. There may be slightly increased perihilar subsegmental atelectasis bilaterally. Normal inspiratory volumes. No focal airspace consolidation, pleural effusion, pulmonary edema or pneumothorax. No acute osseous abnormality.  IMPRESSION: 1. Slightly increased bilateral perihilar subsegmental atelectasis. 2. Otherwise, stable chest x-ray with mild chronic bronchitic change. 3. Trace aortic atherosclerosis.   Electronically Signed   By: Malachy Moan M.D.   On: 09/19/2014 10:23   US Paracentesis  09/20/2014   INDICATION: Cirrhosis, hepatitis C, recurrent ascites. Request made for diagnostic/therapeutic paracentesis.  EXAM: ULTRASOUND-GUIDED DIAGNOSTIC/THERAPEUTIC  PARACENTESIS  COMPARISON:  Prior paracentesis on 03/06/2013  MEDICATIONS: None.  COMPLICATIONS: None immediate  TECHNIQUE: Informed written consent was obtained from the patient after a discussion of the risks, benefits and alternatives to treatment. A timeout was performed prior to the initiation of the procedure.  Initial ultrasound scanning demonstrates a moderate to large amount of ascites within the left lower abdominal quadrant. The left lower abdomen was prepped and draped in the usual sterile fashion. 1% lidocaine was used for local anesthesia. Under direct ultrasound guidance, a 19 gauge, 10-cm, Yueh  catheter was introduced. An ultrasound image was saved for documentation purposed. The paracentesis was performed. The catheter was removed and a dressing was applied. The patient tolerated the procedure well without immediate post procedural complication.  FINDINGS: A total of approximately 4.1 liters of yellow fluid was removed. Samples were sent to the laboratory as requested by the clinical team.  IMPRESSION: Successful ultrasound-guided diagnostic/therapeutic paracentesis yielding 4.1 liters of peritoneal fluid.  Read by: Jeananne RamaKevin Allred, PA-C   Electronically Signed   By: Irish LackGlenn  Yamagata M.D.   On: 09/20/2014 17:41         Subjective:  patient is feeling better. His abdominal pain is somewhat improved. Breathing has improved. Denies chest pain, fevers, chills, nausea, vomiting, diarrhea, dysuria, hematuria, hematochezia, melena.   Objective: Filed Vitals:   09/20/14 1612 09/20/14 1616 09/20/14 1621 09/20/14 1948  BP: 112/73 111/74 111/72   Pulse:      Temp:      TempSrc:      Resp:      Height:      Weight:      SpO2:    95%    Intake/Output Summary (Last 24 hours) at 09/20/14 2022 Last data filed at 09/20/14 1822  Gross per 24 hour  Intake    650 ml  Output    900 ml  Net   -250 ml   Weight change:  Exam:   General:  Pt is alert, follows commands appropriately, not in acute distress  HEENT: No icterus, No thrush, No neck mass, [place]/AT  Cardiovascular: RRR, S1/S2, no rubs, no gallops  Respiratory: bibasilar crackles without wheeze   Abdomen: Soft/+BS, epigastric pain without any rebound, non distended, no guarding  Extremities: No edema, No lymphangitis, No petechiae, No rashes, no synovitis  Data Reviewed: Basic Metabolic Panel:  Recent Labs Lab 09/19/14 0914 09/20/14 0432  NA 134* 135  K 4.1 3.5  CL 105 103  CO2 23 27  GLUCOSE 208* 164*  BUN <5* 5*  CREATININE 0.66 0.78  CALCIUM 8.2* 8.2*   Liver Function Tests:  Recent Labs Lab 09/19/14 0914  09/20/14 0432  AST 81* 65*  ALT 36 30  ALKPHOS 189* 171*  BILITOT 1.3* 1.3*  PROT 7.2 6.3  ALBUMIN 2.4* 2.1*    Recent Labs Lab 09/19/14 0914  LIPASE 32   No results for input(s): AMMONIA in the last 168 hours. CBC:  Recent Labs Lab 09/19/14 0914 09/20/14 0432  WBC 7.2 7.7  NEUTROABS 3.0  --   HGB 11.7* 10.5*  HCT 36.6* 34.8*  MCV 63.0* 63.9*  PLT 313 260   Cardiac Enzymes:  Recent Labs Lab 09/19/14 1000  TROPONINI <0.03   BNP: Invalid input(s): POCBNP CBG:  Recent Labs Lab 09/19/14 1804 09/19/14 2100 09/20/14 0728 09/20/14 1134 09/20/14 1745  GLUCAP 185* 227* 154* 136* 187*    No results found for this or any previous visit (from the past 240 hour(s)).   Scheduled Meds: . albumin human  50 g Intravenous Once  . cefTRIAXone (ROCEPHIN)  IV  1 g Intravenous Q24H  . folic acid  1 mg Oral Daily  . furosemide  40 mg Oral BID  . gabapentin  600 mg Oral TID  . insulin aspart  0-9 Units Subcutaneous TID WC  . insulin detemir  8 Units Subcutaneous QHS  . [START ON 09/21/2014] ipratropium-albuterol  3 mL Nebulization BID  . multivitamin with minerals  1 tablet Oral Daily  . nortriptyline  50 mg Oral QHS  . pantoprazole  40 mg Oral BID  . polyethylene glycol  17 g Oral Daily  . propranolol  10 mg Oral TID  . rifaximin  550 mg Oral BID  . sodium chloride  3 mL Intravenous Q12H  . spironolactone  100 mg Oral Daily  . thiamine  100 mg Oral Daily   Or  . thiamine  100 mg Intravenous Daily   Continuous Infusions:    Antonella Upson, DO  [facility] Pager [phone]  If 7PM-7AM, please contact night-coverage [email or web address] Password [email or web address] 09/20/2014, 8:22 PM   LOS: 1 day

## 2014-09-20 NOTE — Progress Notes (Signed)
  Echocardiogram 2D Echocardiogram has been performed.  Curtis Estrada, Curtis Estrada 09/20/2014, 1:55 PM

## 2014-09-20 NOTE — Procedures (Signed)
US guided diagnostic/therapeutic paracentesis performed yielding 4.1 liters yellow fluid. A portion of the fluid was sent to the lab for preordered studies. No immediate complications.

## 2014-09-20 NOTE — Progress Notes (Signed)
*  PRELIMINARY RESULTS* Vascular Ultrasound Bilateral lower extremity venous duplex has been completed.  Preliminary findings: Bilateral:  No evidence of DVT, superficial thrombosis, or Baker's Cyst.    Avagail Whittlesey FRANCES 09/20/2014, 4:17 PM

## 2014-09-20 NOTE — Care Management Note (Addendum)
    Page 1 of 1   09/21/2014     3:43:59 PM CARE MANAGEMENT NOTE 09/21/2014  Patient:  Curtis Estrada,Curtis Estrada   Account Number:  [id number]  Date Initiated:  09/20/2014  Documentation initiated by:  Lanier ClamMAHABIR,Jill Stopka  Subjective/Objective Assessment:   59 y/o m admitted w/ascites.     Action/Plan:   From home.[facility] to [facility].   Anticipated DC Date:  09/21/2014   Anticipated DC Plan:  HOME/SELF CARE      DC Planning Services  CM consult      Choice offered to / List presented to:             Status of service:  Completed, signed off Medicare Important Message given?   (If response is "NO", the following Medicare IM given date fields will be blank) Date Medicare IM given:   Medicare IM given by:   Date Additional Medicare IM given:   Additional Medicare IM given by:    Discharge Disposition:  HOME/SELF CARE  Per UR Regulation:  Reviewed for med. necessity/level of care/duration of stay  If discussed at Long Length of Stay Meetings, dates discussed:    Comments:  09/21/14 Lanier ClamKathy Nolton Denis RN BSN NCM [phone] Spoke to [facility] yesterday [place] in[facility]frmed of admission.d/c home no needs or orders.  09/20/14 Lanier ClamKathy Marsena Taff RN BSN NCM [phone] No anticipated d/c needs.

## 2014-09-21 ENCOUNTER — Encounter (HOSPITAL_COMMUNITY): Payer: Self-pay | Admitting: Internal Medicine

## 2014-09-21 DIAGNOSIS — B1920 Unspecified viral hepatitis C without hepatic coma: Secondary | ICD-10-CM

## 2014-09-21 DIAGNOSIS — E1165 Type 2 diabetes mellitus with hyperglycemia: Secondary | ICD-10-CM

## 2014-09-21 DIAGNOSIS — D509 Iron deficiency anemia, unspecified: Secondary | ICD-10-CM

## 2014-09-21 DIAGNOSIS — R0600 Dyspnea, unspecified: Secondary | ICD-10-CM | POA: Diagnosis present

## 2014-09-21 DIAGNOSIS — F1020 Alcohol dependence, uncomplicated: Secondary | ICD-10-CM | POA: Diagnosis present

## 2014-09-21 DIAGNOSIS — K7460 Unspecified cirrhosis of liver: Secondary | ICD-10-CM | POA: Diagnosis present

## 2014-09-21 DIAGNOSIS — Z720 Tobacco use: Secondary | ICD-10-CM | POA: Diagnosis present

## 2014-09-21 DIAGNOSIS — E876 Hypokalemia: Secondary | ICD-10-CM

## 2014-09-21 DIAGNOSIS — R600 Localized edema: Secondary | ICD-10-CM | POA: Diagnosis present

## 2014-09-21 DIAGNOSIS — K7290 Hepatic failure, unspecified without coma: Secondary | ICD-10-CM | POA: Diagnosis present

## 2014-09-21 LAB — COMPREHENSIVE METABOLIC PANEL
ALK PHOS: 171 U/L — AB (ref 39–117)
ALT: 25 U/L (ref 0–53)
AST: 51 U/L — ABNORMAL HIGH (ref 0–37)
Albumin: 2.7 g/dL — ABNORMAL LOW (ref 3.5–5.2)
Anion gap: 8 (ref 5–15)
BILIRUBIN TOTAL: 1 mg/dL (ref 0.3–1.2)
BUN: 5 mg/dL — AB (ref 6–23)
CO2: 25 mmol/L (ref 19–32)
CREATININE: 0.71 mg/dL (ref 0.50–1.35)
Calcium: 8.2 mg/dL — ABNORMAL LOW (ref 8.4–10.5)
Chloride: 103 mmol/L (ref 96–112)
GFR calc Af Amer: >90 mL/min (ref >90)
GFR calc non Af Amer: >90 mL/min (ref >90)
Glucose, Bld: 194 mg/dL — ABNORMAL HIGH (ref 70–99)
Potassium: 3.4 mmol/L — ABNORMAL LOW (ref 3.5–5.1)
SODIUM: 136 mmol/L (ref 135–145)
Total Protein: 6.3 g/dL (ref 6.0–8.3)

## 2014-09-21 LAB — HEMOGLOBIN A1C
HEMOGLOBIN A1C: 9.3 % — AB (ref 4.8–5.6)
Mean Plasma Glucose: 220 mg/dL

## 2014-09-21 LAB — HIV ANTIBODY (ROUTINE TESTING W REFLEX): HIV Screen 4th Generation wRfx: NONREACTIVE

## 2014-09-21 LAB — CBC
HCT: 32.8 % — ABNORMAL LOW (ref 39.0–52.0)
Hemoglobin: 10.3 g/dL — ABNORMAL LOW (ref 13.0–17.0)
MCH: 19.9 pg — AB (ref 26.0–34.0)
MCHC: 31.4 g/dL (ref 30.0–36.0)
MCV: 63.3 fL — ABNORMAL LOW (ref 78.0–100.0)
PLATELETS: 257 10*3/uL (ref 150–400)
RBC: 5.18 MIL/uL (ref 4.22–5.81)
RDW: 21.4 % — AB (ref 11.5–15.5)
WBC: 5.6 10*3/uL (ref 4.0–10.5)

## 2014-09-21 LAB — PATHOLOGIST SMEAR REVIEW

## 2014-09-21 LAB — GLUCOSE, CAPILLARY
GLUCOSE-CAPILLARY: 203 mg/dL — AB (ref 70–99)
Glucose-Capillary: 173 mg/dL — ABNORMAL HIGH (ref 70–99)

## 2014-09-21 MED ORDER — ADULT MULTIVITAMIN W/MINERALS CH: 1.0000 | ORAL_TABLET | Freq: Every day | ORAL | Status: AC

## 2014-09-21 MED ORDER — LEVOFLOXACIN 750 MG PO TABS: 750.0000 mg | ORAL_TABLET | Freq: Every day | ORAL | Status: DC

## 2014-09-21 MED ORDER — POTASSIUM CHLORIDE CRYS ER 20 MEQ PO TBCR
40.0000 meq | EXTENDED_RELEASE_TABLET | Freq: Once | ORAL | Status: AC
  Administered 2014-09-21: 40 meq via ORAL
  Filled 2014-09-21: qty 2

## 2014-09-21 MED ORDER — INSULIN ASPART 100 UNIT/ML ~~LOC~~ SOLN
0.0000 [IU] | Freq: Once | SUBCUTANEOUS | Status: AC
  Administered 2014-09-21: 3 [IU] via SUBCUTANEOUS

## 2014-09-21 MED ORDER — INSULIN ASPART 100 UNIT/ML ~~LOC~~ SOLN
0.0000 [IU] | Freq: Three times a day (TID) | SUBCUTANEOUS | Status: DC
  Administered 2014-09-21: 3 [IU] via SUBCUTANEOUS

## 2014-09-21 MED ORDER — FOLIC ACID 1 MG PO TABS: 1.0000 mg | ORAL_TABLET | Freq: Every day | ORAL | Status: AC

## 2014-09-21 MED ORDER — INSULIN ASPART 100 UNIT/ML ~~LOC~~ SOLN: 0.0000 [IU] | Freq: Every day | SUBCUTANEOUS | Status: DC

## 2014-09-21 MED ORDER — METRONIDAZOLE 500 MG PO TABS: 500.0000 mg | ORAL_TABLET | Freq: Three times a day (TID) | ORAL | Status: DC

## 2014-09-21 NOTE — Progress Notes (Signed)
Pt d/c to home by MD. Discharge instructions, prescriptions, reasons to return to ED/MD, and follow up appts reviewed with pt. Pt [facility] stated they will be contacting him regarding follow up appt. Pt also discussed he will mention getting [facility] [place]. PIV removed without complication. Pt to be escorted off of unit via wheelchair to main lobby and care of father when he arrives.

## 2014-09-21 NOTE — Progress Notes (Signed)
PT Cancellation Note  Patient Details Name: Curtis Estrada MRN: [id number] DOB: [birth year]   Cancelled Treatment:    Reason Eval/Treat Not Completed: PT screened, no needs identified, will sign off Pt declines, sitting EOB half dressed. Pt states he is just waiting on RN for paperwork, wishes to leave (RN notified).   Lilianah Buffin,KATHrine E 09/21/2014, 2:34 PM Zenovia JarredKati Turki Tapanes, PT, DPT 09/21/2014 Pager: [phone]

## 2014-09-21 NOTE — Discharge Instructions (Signed)
Ascites °Ascites is a gathering of fluid in the belly (abdomen). This is most often caused by liver disease. It may also be caused by a number of other less common problems. It causes a ballooning out (distension) of the abdomen. °CAUSES  °Scarring of the liver (cirrhosis) is the most common cause of ascites. Other causes include: °· Infection or inflammation in the abdomen. °· Cancer in the abdomen. °· Heart failure. °· Certain forms of kidney failure (nephritic syndrome). °· Inflammation of the pancreas. °· Clots in the veins of the liver. °SYMPTOMS  °In the early stages of ascites, you may not have any symptoms. The main symptom of ascites is a sense of abdominal bloating. This is due to the presence of fluid. This may also cause an increase in abdominal or waist size. People with this condition can develop swelling in the legs, and men can develop a swollen scrotum. When there is a lot of fluid, it may be hard to breath. Stretching of the abdomen by fluid can be painful. °DIAGNOSIS  °Certain features of your medical history, such as a history of liver disease and of an enlarging abdomen, can suggest the presence of ascites. The diagnosis of ascites can be made on physical exam by your caregiver. An abdominal ultrasound examination can confirm that ascites is present, and estimate the amount of fluid. °Once ascites is confirmed, it is important to determine its cause. Again, a history of one of the conditions listed in "CAUSES" provides a strong clue. A physical exam is important, and blood and X-ray tests may be needed. During a procedure called paracentesis, a sample of fluid is removed from the abdomen. This can determine certain key features about the fluid, such as whether or not infection or cancer is present. Your caregiver will determine if a paracentesis is necessary. They will describe the procedure to you. °PREVENTION  °Ascites is a complication of other conditions. Therefore to prevent ascites, you  must seek treatment for any significant health conditions you have. Once ascites is present, careful attention to fluid and salt intake may help prevent it from getting worse. If you have ascites, you should not drink alcohol. °PROGNOSIS  °The prognosis of ascites depends on the underlying disease. If the disease is reversible, such as with certain infections or with heart failure, then ascites may improve or disappear. When ascites is caused by cirrhosis, then it indicates that the liver disease has worsened, and further evaluation and treatment of the liver disease is needed. If your ascites is caused by cancer, then the success or failure of the cancer treatment will determine whether your ascites will improve or worsen. °RISKS AND COMPLICATIONS  °Ascites is likely to worsen if it is not properly diagnosed and treated. A large amount of ascites can cause pain and difficulty breathing. The main complication, besides worsening, is infection (called spontaneous bacterial peritonitis). This requires prompt treatment. °TREATMENT  °The treatment of ascites depends on its cause. When liver disease is your cause, medical management using water pills (diuretics) and decreasing salt intake is often effective. Ascites due to peritoneal inflammation or malignancy (cancer) alone does not respond to salt restriction and diuretics. Hospitalization is sometimes required. °If the treatment of ascites cannot be managed with medications, a number of other treatments are available. Your caregivers will help you decide which will work best for you. Some of these are: °· Removal of fluid from the abdomen (paracentesis). °· Fluid from the abdomen is passed into a vein (peritoneovenous shunting). °·   Liver transplantation. °· Transjugular intrahepatic portosystemic stent shunt. °HOME CARE INSTRUCTIONS  °It is important to monitor body weight and the intake and output of fluids. Weigh yourself at the same time every day. Record your  weights. Fluid restriction may be necessary. It is also important to know your salt intake. The more salt you take in, the more fluid you will retain. Ninety percent of people with ascites respond to this approach. °· Follow any directions for medicines carefully. °· Follow up with your caregiver, as directed. °· Report any changes in your health, especially any new or worsening symptoms. °· If your ascites is from liver disease, avoid alcohol and other substances toxic to the liver. °SEEK MEDICAL CARE IF:  °· Your weight increases more than a few pounds in a few days. °· Your abdominal or waist size increases. °· You develop swelling in your legs. °· You had swelling and it worsens. °SEEK IMMEDIATE MEDICAL CARE IF:  °· You develop a fever. °· You develop new abdominal pain. °· You develop difficulty breathing. °· You develop confusion. °· You have bleeding from the mouth, stomach, or rectum. °MAKE SURE YOU:  °· Understand these instructions. °· Will watch your condition. °· Will get help right away if you are not doing well or get worse. °Document Released: 06/30/2005 Document Revised: 09/22/2011 Document Reviewed: 01/29/2007 °[employer]® Patient Information ©2015 [employer]. This information is not intended to replace advice given to you by your health care provider. Make sure you discuss any questions you have with your health care provider. ° °

## 2014-09-21 NOTE — Discharge Summary (Signed)
Physician Discharge Summary  Curtis Estrada ZOX:[id number] DOB: Feb 27, 1956 DOA: 09/19/2014  PCP: [facility]  Admit date: 09/19/2014 Discharge date: 09/21/2014   Recommendations for Outpatient Follow-Up:   1. Recommend close follow-up of diabetes/glycemic control and blood pressure control. 2. Follow-up final ascitic cultures, likely sterile, as the patient was treated with antibiotics prior to obtaining culture.   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  Diet recommendation: Low sodium, heart healthy.  Carbohydrate-modified.     History of Present Illness:   Curtis Estrada is an 59 y.o. male with a history of cirrhosis secondary to alcohol and chronic hepatitis C, diabetes mellitus, hypertension, and coagulopathy who was admitted 09/19/14 with a one-month history of worsening leg edema, shortness of breath and abdominal distention. The patient denies any fevers or chills, but states that he has been coughing for the better part of one month without any hemoptysis. Examination revealed that the patient had significant ascites. He underwent ultrasound-guided paracentesis on 09/20/2014 removing 4.1 L. There was significant symptomatic improvement after the paracentesis.   Hospital Course by Problem:   Principal problem  Decompensated liver cirrhosis with large volume ascites / Coagulopathy secondary to cirrhosis -09/20/14--US paracentesis with IR--4.1L removed. Albumin 50 g given after paracentesis. -Follow-up ascitic fluid cultures, negative to date. -Continue  diuretics, renal function stable. -Continue propranolol, lactulose and rifaximin. -Mildly coagulopathic INR 1.52, PTT 36. No evidence of acute bleeding. -HIV antibody negative. -Fluid restrict 1200 mL/24 hours.  Active problems Spontaneous bacterial peritonitis -The patient had abdominal pain at the time of presentation. -WBC 568 from ascites suggesting infectious process--unfortunately, the patient had received ceftriaxone which likely compromises his cultures, which remain negative to date. -Continue empiric levofloxacin and Flagyl. Patient counseled about the need to avoid drinking any alcohol while on Flagyl. -Lactic acid 2.2 on admission, supporting diagnosis of SBP.  Hypokalemia -Secondary to diuretics. Replete.  Dyspnea  -The major issue may be the patient's ascites pushing on his diaphragm.  -Improved after paracentesis.  -EKG--sinus tachycardia, no ST or T-wave change. -Echocardiogram-EF 55-60%, grade 1 diastolic dysfunction, normal RV.   Alcohol dependence -Alcohol withdrawal protocol initiated on admission. -Cessation discussed. -Social worker consulted for referral to outpatient alcohol treatment.  Tobacco abuse -Suspect the patient has underlying COPD -Duo nebs when necessary shortness of breath and coughing.  Diabetes mellitus type 2, uncontrolled -Hemoglobin A1c 9.3% corresponding to a mean plasma glucose of 220. -Currently being managed with 8 units of Levemir and insulin sensitive SSI 3 times a day. CBGs 136-217. -Change SSI to moderate scale with at bedtime coverage. -Resume home insulin therapy on discharge with close outpatient follow-up. -Carbohydrate modified diet.  Lower extremity edema  -Diuretics as discussed. -Venous duplex--neg.   Medical Consultants:    None.   Discharge Exam:   Filed Vitals:   09/21/14 0520  BP: 115/75  Pulse: 91  Temp: 98 F (36.7 C)  Resp: 18   Filed Vitals:   09/20/14 2116 09/20/14 2138 09/21/14 0520  09/21/14 0735  BP: 109/74 112/79 115/75   Pulse: 97 96  91   Temp: 98.2 F (36.8 C) 98.6 F (37 C) 98 F (36.7 C)   TempSrc: Oral Oral Oral   Resp: 18 18 18    Height:      Weight:      SpO2: 98% 98% 99% 98%    Gen:  NAD Cardiovascular:  RRR, No M/R/G Respiratory: Lungs CTAB Gastrointestinal: Abdomen soft, NT/ND with normal active bowel sounds. Extremities: No C/E/C   The results of significant diagnostics from this hospitalization (including imaging, microbiology, ancillary and laboratory) are listed below for reference.     Procedures and Diagnostic Studies:   Dg Chest 2 View  09/19/2014   CLINICAL DATA:  59 year old male with 1 month history of cough and congestion with recently progressive shortness of breath.  EXAM: CHEST  2 VIEW  COMPARISON:  Prior chest x-ray 05/30/2014  FINDINGS: Cardiac and mediastinal contours are within normal limits. Trace atherosclerotic calcification present in the transverse aorta. Central bronchitic changes similar compared to prior. There may be slightly increased perihilar subsegmental atelectasis bilaterally. Normal inspiratory volumes. No focal airspace consolidation, pleural effusion, pulmonary edema or pneumothorax. No acute osseous abnormality.  IMPRESSION: 1. Slightly increased bilateral perihilar subsegmental atelectasis. 2. Otherwise, stable chest x-ray with mild chronic bronchitic change. 3. Trace aortic atherosclerosis.   Electronically Signed   By: Malachy Moan M.D.   On: 09/19/2014 10:23   US Paracentesis  09/20/2014   INDICATION: Cirrhosis, hepatitis C, recurrent ascites. Request made for diagnostic/therapeutic paracentesis.  EXAM: ULTRASOUND-GUIDED DIAGNOSTIC/THERAPEUTIC  PARACENTESIS  COMPARISON:  Prior paracentesis on 03/06/2013  MEDICATIONS: None.  COMPLICATIONS: None immediate  TECHNIQUE: Informed written consent was obtained from the patient after a discussion of the risks, benefits and alternatives to treatment. A timeout was  performed prior to the initiation of the procedure.  Initial ultrasound scanning demonstrates a moderate to large amount of ascites within the left lower abdominal quadrant. The left lower abdomen was prepped and draped in the usual sterile fashion. 1% lidocaine was used for local anesthesia. Under direct ultrasound guidance, a 19 gauge, 10-cm, Yueh catheter was introduced. An ultrasound image was saved for documentation purposed. The paracentesis was performed. The catheter was removed and a dressing was applied. The patient tolerated the procedure well without immediate post procedural complication.  FINDINGS: A total of approximately 4.1 liters of yellow fluid was removed. Samples were sent to the laboratory as requested by the clinical team.  IMPRESSION: Successful ultrasound-guided diagnostic/therapeutic paracentesis yielding 4.1 liters of peritoneal fluid.  Read by: Jeananne Darly Massi, PA-C   Electronically Signed   By: Irish Lack M.D.   On: 09/20/2014 17:41     Labs:   Basic Metabolic Panel:  Recent Labs Lab 09/19/14 0914 09/20/14 0432 09/21/14 0426  NA 134* 135 136  K 4.1 3.5 3.4*  CL 105 103 103  CO2 23 27 25   GLUCOSE 208* 164* 194*  BUN <5* 5* 5*  CREATININE 0.66 0.78 0.71  CALCIUM 8.2* 8.2* 8.2*   GFR Estimated Creatinine Clearance: 106.4 mL/min (by C-G formula based on Cr of 0.71). Liver Function Tests:  Recent Labs Lab 09/19/14 0914 09/20/14 0432 09/21/14 0426  AST 81* 65* 51*  ALT 36 30 25  ALKPHOS 189* 171* 171*  BILITOT 1.3* 1.3* 1.0  PROT 7.2 6.3 6.3  ALBUMIN 2.4* 2.1* 2.7*    Recent Labs Lab 09/19/14 0914  LIPASE 32   Coagulation profile  Recent Labs Lab 09/19/14 1920  INR 1.52*    CBC:  Recent Labs Lab 09/19/14 0914  09/20/14 0432 09/21/14 0426  WBC 7.2 7.7 5.6  NEUTROABS 3.0  --   --   HGB 11.7* 10.5* 10.3*  HCT 36.6* 34.8* 32.8*  MCV 63.0* 63.9* 63.3*  PLT 313 260 257   Cardiac Enzymes:  Recent Labs Lab 09/19/14 1000  TROPONINI  <0.03   CBG:  Recent Labs Lab 09/20/14 1134 09/20/14 1745 09/20/14 2115 09/21/14 0735 09/21/14 1244  GLUCAP 136* 187* 217* 203* 173*   Hgb A1c  Recent Labs  09/19/14 1920  HGBA1C 9.3*   Microbiology Recent Results (from the past 240 hour(s))  Body fluid culture     Status: None (Preliminary result)   Collection Time: 09/20/14  4:08 PM  Result Value Ref Range Status   Specimen Description ASCITIC  Final   Special Requests NONE  Final   Gram Stain   Final    ABUNDANT WBC PRESENT,BOTH PMN AND MONONUCLEAR NO ORGANISMS SEEN Performed at [facility]    Culture NO GROWTH Performed at [facility]   Final   Report Status PENDING  Incomplete     Discharge Instructions:       Discharge Instructions    Call MD for:  extreme fatigue    Complete by:  As directed      Call MD for:  persistant nausea and vomiting    Complete by:  As directed      Call MD for:  severe uncontrolled pain    Complete by:  As directed      Call MD for:  temperature >100.4    Complete by:  As directed      Diet - low sodium heart healthy    Complete by:  As directed      Diet Carb Modified    Complete by:  As directed      Discharge instructions    Complete by:  As directed   You were cared for by Dr. Hillery Aldo  (a hospitalist) during your hospital stay. If you have any questions about your discharge medications or the care you received while you were in the hospital after you are discharged, you can call the unit and ask to speak with the hospitalist on call if the hospitalist that took care of you is not available. Once you are discharged, your primary care physician will handle any further medical issues. Please note that NO REFILLS for any discharge medications will be authorized once you are discharged, as it is imperative that you return to your primary care physician (or establish a relationship with a primary care physician if you do not have one) for your aftercare  needs so that they can reassess your need for medications and monitor your lab values.  Any outstanding tests can be reviewed by your PCP at your follow up visit.  It is also important to review any medicine changes with your PCP.  Please bring these d/c instructions with you to your next visit so your physician can review these changes with you.  If you do not have a primary care physician, you can call [phone] for a physician referral.  It is highly recommended that you obtain a PCP for hospital follow up.     Increase activity slowly    Complete by:  As directed             Medication List    STOP taking these medications        predniSONE 50 MG tablet  Commonly known as:  DELTASONE      TAKE these medications        albuterol 108 (90 BASE) MCG/ACT inhaler  Commonly known as:  PROVENTIL HFA;VENTOLIN HFA  Inhale 2 puffs into the lungs every 2 (two) hours as needed for wheezing or shortness of breath (or coughing).     folic acid 1 MG tablet  Commonly known as:  FOLVITE  Take 1 tablet (1 mg total) by mouth daily.     furosemide 40 MG tablet  Commonly known as:  LASIX  Take 1 tablet (40 mg total) by mouth 2 (two) times daily.     gabapentin 300 MG capsule  Commonly known as:  NEURONTIN  Take 600 mg by mouth 3 (three) times daily.     insulin aspart 100 UNIT/ML injection  Commonly known as:  novoLOG  Inject 0-6 Units into the skin 3 (three) times daily with meals. 0-6 Units, Subcutaneous, 3 times daily with meals  CBG < 70: implement hypoglycemia protocol; CBG 70 - 120: 0 units; CBG 121 - 150: 1 unit; CBG 151 - 200: 2 units; CBG 201 - 250: 3 units; CBG 251 - 300: 5 units; CBG 301 - 350: 7 units; CBG 351 - 400: 9 units; CBG > 400: call MD     insulin detemir 100 UNIT/ML injection  Commonly known as:  LEVEMIR  Inject 16 Units into the skin at bedtime.     lactulose 10 GM/15ML solution  Commonly known as:  CHRONULAC  Take 20 g by mouth 2 (two) times daily as needed for mild  constipation.     levofloxacin 750 MG tablet  Commonly known as:  LEVAQUIN  Take 1 tablet (750 mg total) by mouth daily.     Magnesium Oxide 420 MG Tabs  Take 1 tablet by mouth daily.     metFORMIN 1000 MG tablet  Commonly known as:  GLUCOPHAGE  Take 1,000 mg by mouth 2 (two) times daily with a meal.     metroNIDAZOLE 500 MG tablet  Commonly known as:  FLAGYL  Take 1 tablet (500 mg total) by mouth every 8 (eight) hours.     multivitamin with minerals Tabs tablet  Take 1 tablet by mouth daily.     nortriptyline 50 MG capsule  Commonly known as:  PAMELOR  Take 50 mg by mouth at bedtime.     pantoprazole 40 MG tablet  Commonly known as:  PROTONIX  Take 40 mg by mouth 2 (two) times daily.     polyethylene glycol packet  Commonly known as:  MIRALAX / GLYCOLAX  Take 17 g by mouth daily.     polyvinyl alcohol 1.4 % ophthalmic solution  Commonly known as:  LIQUIFILM TEARS  Place 1 drop into both eyes daily as needed (dry eyes).     propranolol 10 MG tablet  Commonly known as:  INDERAL  Take 1 tablet (10 mg total) by mouth 3 (three) times daily.     rifaximin 550 MG Tabs tablet  Commonly known as:  XIFAXAN  Take 1 tablet (550 mg total) by mouth 2 (two) times daily at 10 AM and 5 PM.     sildenafil 100 MG tablet  Commonly known as:  VIAGRA  Take 100 mg by mouth daily as needed for erectile dysfunction.     spironolactone 100 MG tablet  Commonly known as:  ALDACTONE  Take 1 tablet (100 mg total) by mouth daily.       Follow-up Information  Follow up with [facility]. Schedule an appointment as soon as possible for a visit in 2 weeks.   Specialty:  General Practice   Contact information:   [address] [place] [place] [address] [phone]        Time coordinating discharge: 35 minutes.  Signed:  Peyton Spengler  Pager [phone] [facility] 09/21/2014, 4:34 PM

## 2014-09-24 LAB — BODY FLUID CULTURE: Culture: NO GROWTH

## 2015-02-12 ENCOUNTER — Inpatient Hospital Stay (HOSPITAL_COMMUNITY)
Admission: EM | Admit: 2015-02-12 | Discharge: 2015-02-19 | DRG: 421 | Disposition: A | Discharge status: Home / Self Care | Payer: Non-veteran care | Attending: Internal Medicine | Admitting: Internal Medicine

## 2015-02-12 ENCOUNTER — Emergency Department (HOSPITAL_COMMUNITY): Payer: Non-veteran care

## 2015-02-12 ENCOUNTER — Encounter (HOSPITAL_COMMUNITY): Payer: Self-pay

## 2015-02-12 DIAGNOSIS — B182 Chronic viral hepatitis C: Secondary | ICD-10-CM | POA: Diagnosis present

## 2015-02-12 DIAGNOSIS — I10 Essential (primary) hypertension: Secondary | ICD-10-CM | POA: Diagnosis present

## 2015-02-12 DIAGNOSIS — F17210 Nicotine dependence, cigarettes, uncomplicated: Secondary | ICD-10-CM | POA: Diagnosis present

## 2015-02-12 DIAGNOSIS — F1020 Alcohol dependence, uncomplicated: Secondary | ICD-10-CM

## 2015-02-12 DIAGNOSIS — F1029 Alcohol dependence with unspecified alcohol-induced disorder: Secondary | ICD-10-CM | POA: Diagnosis not present

## 2015-02-12 DIAGNOSIS — R05 Cough: Secondary | ICD-10-CM | POA: Diagnosis present

## 2015-02-12 DIAGNOSIS — Z794 Long term (current) use of insulin: Secondary | ICD-10-CM | POA: Diagnosis not present

## 2015-02-12 DIAGNOSIS — Z720 Tobacco use: Secondary | ICD-10-CM | POA: Diagnosis present

## 2015-02-12 DIAGNOSIS — K766 Portal hypertension: Secondary | ICD-10-CM | POA: Diagnosis present

## 2015-02-12 DIAGNOSIS — K7469 Other cirrhosis of liver: Secondary | ICD-10-CM

## 2015-02-12 DIAGNOSIS — K7031 Alcoholic cirrhosis of liver with ascites: Principal | ICD-10-CM | POA: Diagnosis present

## 2015-02-12 DIAGNOSIS — R059 Cough, unspecified: Secondary | ICD-10-CM

## 2015-02-12 DIAGNOSIS — D689 Coagulation defect, unspecified: Secondary | ICD-10-CM | POA: Diagnosis present

## 2015-02-12 DIAGNOSIS — R188 Other ascites: Secondary | ICD-10-CM | POA: Diagnosis not present

## 2015-02-12 DIAGNOSIS — R0600 Dyspnea, unspecified: Secondary | ICD-10-CM | POA: Diagnosis present

## 2015-02-12 DIAGNOSIS — E1165 Type 2 diabetes mellitus with hyperglycemia: Secondary | ICD-10-CM | POA: Diagnosis present

## 2015-02-12 DIAGNOSIS — IMO0002 Reserved for concepts with insufficient information to code with codable children: Secondary | ICD-10-CM | POA: Diagnosis present

## 2015-02-12 DIAGNOSIS — K7030 Alcoholic cirrhosis of liver without ascites: Secondary | ICD-10-CM | POA: Diagnosis present

## 2015-02-12 DIAGNOSIS — J209 Acute bronchitis, unspecified: Secondary | ICD-10-CM | POA: Insufficient documentation

## 2015-02-12 DIAGNOSIS — K7290 Hepatic failure, unspecified without coma: Secondary | ICD-10-CM

## 2015-02-12 DIAGNOSIS — F10288 Alcohol dependence with other alcohol-induced disorder: Secondary | ICD-10-CM | POA: Diagnosis not present

## 2015-02-12 DIAGNOSIS — K7460 Unspecified cirrhosis of liver: Secondary | ICD-10-CM | POA: Diagnosis not present

## 2015-02-12 DIAGNOSIS — K7682 Hepatic encephalopathy: Secondary | ICD-10-CM

## 2015-02-12 DIAGNOSIS — R600 Localized edema: Secondary | ICD-10-CM | POA: Diagnosis not present

## 2015-02-12 DIAGNOSIS — B1920 Unspecified viral hepatitis C without hepatic coma: Secondary | ICD-10-CM | POA: Diagnosis present

## 2015-02-12 LAB — COMPREHENSIVE METABOLIC PANEL
ALBUMIN: 2.2 g/dL — AB (ref 3.5–5.0)
ALK PHOS: 106 U/L (ref 38–126)
ALT: 20 U/L (ref 17–63)
ANION GAP: 8 (ref 5–15)
AST: 37 U/L (ref 15–41)
CO2: 26 mmol/L (ref 22–32)
Calcium: 8.8 mg/dL — ABNORMAL LOW (ref 8.9–10.3)
Chloride: 102 mmol/L (ref 101–111)
Creatinine, Ser: 0.81 mg/dL (ref 0.61–1.24)
GFR calc Af Amer: >60 mL/min (ref >60)
GFR calc non Af Amer: >60 mL/min (ref >60)
Glucose, Bld: 125 mg/dL — ABNORMAL HIGH (ref 65–99)
Potassium: 4.1 mmol/L (ref 3.5–5.1)
SODIUM: 136 mmol/L (ref 135–145)
TOTAL PROTEIN: 7.2 g/dL (ref 6.5–8.1)
Total Bilirubin: 1.4 mg/dL — ABNORMAL HIGH (ref 0.3–1.2)

## 2015-02-12 LAB — CBC WITH DIFFERENTIAL/PLATELET
Basophils Absolute: 0.1 10*3/uL (ref 0.0–0.1)
Basophils Relative: 1 % (ref 0–1)
EOS PCT: 3 % (ref 0–5)
Eosinophils Absolute: 0.2 10*3/uL (ref 0.0–0.7)
HCT: 43 % (ref 39.0–52.0)
Hemoglobin: 13.9 g/dL (ref 13.0–17.0)
LYMPHS PCT: 32 % (ref 12–46)
Lymphs Abs: 1.8 10*3/uL (ref 0.7–4.0)
MCH: 22.6 pg — ABNORMAL LOW (ref 26.0–34.0)
MCHC: 32.3 g/dL (ref 30.0–36.0)
MCV: 70 fL — ABNORMAL LOW (ref 78.0–100.0)
Monocytes Absolute: 1.3 10*3/uL — ABNORMAL HIGH (ref 0.1–1.0)
Monocytes Relative: 23 % — ABNORMAL HIGH (ref 3–12)
NEUTROS ABS: 2.4 10*3/uL (ref 1.7–7.7)
Neutrophils Relative %: 41 % — ABNORMAL LOW (ref 43–77)
Platelets: 271 10*3/uL (ref 150–400)
RBC: 6.14 MIL/uL — AB (ref 4.22–5.81)
RDW: 18 % — ABNORMAL HIGH (ref 11.5–15.5)
WBC: 5.7 10*3/uL (ref 4.0–10.5)

## 2015-02-12 LAB — APTT: APTT: 37 s (ref 24–37)

## 2015-02-12 LAB — GLUCOSE, CAPILLARY
GLUCOSE-CAPILLARY: 107 mg/dL — AB (ref 65–99)
Glucose-Capillary: 133 mg/dL — ABNORMAL HIGH (ref 65–99)
Glucose-Capillary: 114 mg/dL — ABNORMAL HIGH (ref 65–99)

## 2015-02-12 LAB — I-STAT CHEM 8, ED
BUN: 4 mg/dL — ABNORMAL LOW (ref 6–20)
CALCIUM ION: 1.15 mmol/L (ref 1.13–1.30)
CHLORIDE: 101 mmol/L (ref 101–111)
Creatinine, Ser: 0.7 mg/dL (ref 0.61–1.24)
Glucose, Bld: 123 mg/dL — ABNORMAL HIGH (ref 65–99)
HCT: 51 % (ref 39.0–52.0)
Hemoglobin: 17.3 g/dL — ABNORMAL HIGH (ref 13.0–17.0)
Potassium: 4.1 mmol/L (ref 3.5–5.1)
Sodium: 139 mmol/L (ref 135–145)
TCO2: 23 mmol/L (ref 0–100)

## 2015-02-12 LAB — GRAM STAIN

## 2015-02-12 LAB — ALBUMIN, FLUID (OTHER): Albumin, Fluid: <1 g/dL

## 2015-02-12 LAB — BODY FLUID CELL COUNT WITH DIFFERENTIAL
LYMPHS FL: 81 %
MONOCYTE-MACROPHAGE-SEROUS FLUID: 17 % — AB (ref 50–90)
Neutrophil Count, Fluid: 2 % (ref 0–25)
Total Nucleated Cell Count, Fluid: 305 cu mm (ref 0–1000)

## 2015-02-12 LAB — PROTEIN, BODY FLUID: Total protein, fluid: <3 g/dL

## 2015-02-12 LAB — AMMONIA: Ammonia: 57 umol/L — ABNORMAL HIGH (ref 9–35)

## 2015-02-12 LAB — PROTIME-INR
INR: 1.42 (ref 0.00–1.49)
Prothrombin Time: 17.5 seconds — ABNORMAL HIGH (ref 11.6–15.2)

## 2015-02-12 LAB — GLUCOSE, SEROUS FLUID: Glucose, Fluid: 154 mg/dL

## 2015-02-12 LAB — BRAIN NATRIURETIC PEPTIDE: B Natriuretic Peptide: 19 pg/mL (ref 0.0–100.0)

## 2015-02-12 LAB — LACTATE DEHYDROGENASE, PLEURAL OR PERITONEAL FLUID: LD FL: 46 U/L — AB (ref 3–23)

## 2015-02-12 LAB — TROPONIN I: Troponin I: <0.03 ng/mL (ref <0.031)

## 2015-02-12 MED ORDER — RIFAXIMIN 550 MG PO TABS
550.0000 mg | ORAL_TABLET | Freq: Two times a day (BID) | ORAL | Status: DC
  Administered 2015-02-12 – 2015-02-19 (×14): 550 mg via ORAL
  Filled 2015-02-12 (×14): qty 1

## 2015-02-12 MED ORDER — LACTULOSE 10 GM/15ML PO SOLN
20.0000 g | Freq: Once | ORAL | Status: AC
  Administered 2015-02-12: 20 g via ORAL
  Filled 2015-02-12: qty 30

## 2015-02-12 MED ORDER — SODIUM CHLORIDE 0.9 % IJ SOLN
3.0000 mL | Freq: Two times a day (BID) | INTRAMUSCULAR | Status: DC
Start: 2015-02-12 — End: 2015-02-19
  Administered 2015-02-12 – 2015-02-19 (×10): 3 mL via INTRAVENOUS

## 2015-02-12 MED ORDER — SODIUM CHLORIDE 0.9 % IJ SOLN
3.0000 mL | Freq: Two times a day (BID) | INTRAMUSCULAR | Status: DC
Start: 2015-02-12 — End: 2015-02-19
  Administered 2015-02-14 – 2015-02-18 (×8): 3 mL via INTRAVENOUS

## 2015-02-12 MED ORDER — ACETAMINOPHEN 325 MG PO TABS: 650.0000 mg | ORAL_TABLET | Freq: Four times a day (QID) | ORAL | Status: DC | PRN

## 2015-02-12 MED ORDER — SODIUM CHLORIDE 0.9 % IV SOLN: 250.0000 mL | INTRAVENOUS | Status: DC | PRN

## 2015-02-12 MED ORDER — MAGNESIUM OXIDE 420 MG PO TABS: 1.0000 | ORAL_TABLET | Freq: Every day | ORAL | Status: DC

## 2015-02-12 MED ORDER — ALBUTEROL SULFATE (2.5 MG/3ML) 0.083% IN NEBU: 3.0000 mL | INHALATION_SOLUTION | RESPIRATORY_TRACT | Status: DC | PRN

## 2015-02-12 MED ORDER — LORAZEPAM 2 MG/ML IJ SOLN
1.0000 mg | Freq: Once | INTRAMUSCULAR | Status: DC
  Filled 2015-02-12 (×2): qty 1

## 2015-02-12 MED ORDER — POLYVINYL ALCOHOL 1.4 % OP SOLN: 1.0000 [drp] | Freq: Every day | OPHTHALMIC | Status: DC | PRN

## 2015-02-12 MED ORDER — ADULT MULTIVITAMIN W/MINERALS CH
1.0000 | ORAL_TABLET | Freq: Every day | ORAL | Status: DC
  Administered 2015-02-13: 1 via ORAL
  Filled 2015-02-12: qty 1

## 2015-02-12 MED ORDER — NORTRIPTYLINE HCL 25 MG PO CAPS
50.0000 mg | ORAL_CAPSULE | Freq: Every day | ORAL | Status: DC
  Administered 2015-02-12 – 2015-02-15 (×4): 50 mg via ORAL
  Filled 2015-02-12 (×4): qty 2

## 2015-02-12 MED ORDER — PANTOPRAZOLE SODIUM 40 MG PO TBEC
40.0000 mg | DELAYED_RELEASE_TABLET | Freq: Two times a day (BID) | ORAL | Status: DC
  Administered 2015-02-12 – 2015-02-19 (×15): 40 mg via ORAL
  Filled 2015-02-12 (×14): qty 1

## 2015-02-12 MED ORDER — ONDANSETRON HCL 4 MG/2ML IJ SOLN: 4.0000 mg | Freq: Four times a day (QID) | INTRAMUSCULAR | Status: DC | PRN

## 2015-02-12 MED ORDER — SODIUM CHLORIDE 0.9 % IJ SOLN: 3.0000 mL | INTRAMUSCULAR | Status: DC | PRN

## 2015-02-12 MED ORDER — GABAPENTIN 300 MG PO CAPS
600.0000 mg | ORAL_CAPSULE | Freq: Three times a day (TID) | ORAL | Status: DC
  Administered 2015-02-12 – 2015-02-19 (×20): 600 mg via ORAL
  Filled 2015-02-12 (×6): qty 2
  Filled 2015-02-12: qty 6
  Filled 2015-02-12 (×14): qty 2

## 2015-02-12 MED ORDER — MAGNESIUM OXIDE 400 (241.3 MG) MG PO TABS
400.0000 mg | ORAL_TABLET | Freq: Every day | ORAL | Status: DC
  Administered 2015-02-13 – 2015-02-19 (×7): 400 mg via ORAL
  Filled 2015-02-12 (×7): qty 1

## 2015-02-12 MED ORDER — INSULIN ASPART 100 UNIT/ML ~~LOC~~ SOLN
0.0000 [IU] | Freq: Three times a day (TID) | SUBCUTANEOUS | Status: DC
  Administered 2015-02-13: 2 [IU] via SUBCUTANEOUS
  Administered 2015-02-13 – 2015-02-14 (×3): 3 [IU] via SUBCUTANEOUS
  Administered 2015-02-14 – 2015-02-15 (×3): 2 [IU] via SUBCUTANEOUS
  Administered 2015-02-15 – 2015-02-17 (×5): 3 [IU] via SUBCUTANEOUS
  Administered 2015-02-17: 5 [IU] via SUBCUTANEOUS
  Administered 2015-02-18: 3 [IU] via SUBCUTANEOUS
  Administered 2015-02-18 (×2): 2 [IU] via SUBCUTANEOUS

## 2015-02-12 MED ORDER — LACTULOSE 10 GM/15ML PO SOLN
20.0000 g | Freq: Two times a day (BID) | ORAL | Status: DC | PRN
  Administered 2015-02-18: 20 g via ORAL
  Filled 2015-02-12: qty 30

## 2015-02-12 MED ORDER — INSULIN DETEMIR 100 UNIT/ML ~~LOC~~ SOLN
16.0000 [IU] | Freq: Every day | SUBCUTANEOUS | Status: DC
  Administered 2015-02-12 – 2015-02-17 (×6): 16 [IU] via SUBCUTANEOUS
  Filled 2015-02-12 (×8): qty 0.16

## 2015-02-12 MED ORDER — PROPRANOLOL HCL 10 MG PO TABS
10.0000 mg | ORAL_TABLET | Freq: Three times a day (TID) | ORAL | Status: DC
  Administered 2015-02-12 – 2015-02-18 (×15): 10 mg via ORAL
  Filled 2015-02-12 (×22): qty 1

## 2015-02-12 MED ORDER — GUAIFENESIN-DM 100-10 MG/5ML PO SYRP: 5.0000 mL | ORAL_SOLUTION | ORAL | Status: DC | PRN

## 2015-02-12 MED ORDER — FUROSEMIDE 80 MG PO TABS
80.0000 mg | ORAL_TABLET | Freq: Two times a day (BID) | ORAL | Status: DC
  Administered 2015-02-12 – 2015-02-14 (×4): 80 mg via ORAL
  Filled 2015-02-12 (×4): qty 1

## 2015-02-12 MED ORDER — POLYETHYLENE GLYCOL 3350 17 G PO PACK
17.0000 g | PACK | Freq: Every day | ORAL | Status: DC
  Administered 2015-02-13 – 2015-02-19 (×6): 17 g via ORAL
  Filled 2015-02-12 (×6): qty 1

## 2015-02-12 MED ORDER — LIDOCAINE HCL (PF) 1 % IJ SOLN
INTRAMUSCULAR | Status: AC
  Filled 2015-02-12: qty 10

## 2015-02-12 MED ORDER — AZITHROMYCIN 500 MG PO TABS
250.0000 mg | ORAL_TABLET | Freq: Every day | ORAL | Status: DC
Start: 2015-02-13 — End: 2015-02-15
  Administered 2015-02-13 – 2015-02-15 (×3): 250 mg via ORAL
  Filled 2015-02-12 (×3): qty 1

## 2015-02-12 MED ORDER — MORPHINE SULFATE 4 MG/ML IJ SOLN
4.0000 mg | Freq: Once | INTRAMUSCULAR | Status: AC
  Administered 2015-02-12: 4 mg via INTRAVENOUS
  Filled 2015-02-12: qty 1

## 2015-02-12 MED ORDER — AZITHROMYCIN 500 MG PO TABS
500.0000 mg | ORAL_TABLET | Freq: Every day | ORAL | Status: AC
  Administered 2015-02-12: 500 mg via ORAL
  Filled 2015-02-12: qty 1

## 2015-02-12 MED ORDER — ACETAMINOPHEN 650 MG RE SUPP: 650.0000 mg | Freq: Four times a day (QID) | RECTAL | Status: DC | PRN

## 2015-02-12 MED ORDER — SPIRONOLACTONE 25 MG PO TABS
100.0000 mg | ORAL_TABLET | Freq: Two times a day (BID) | ORAL | Status: DC
  Administered 2015-02-12 – 2015-02-18 (×13): 100 mg via ORAL
  Filled 2015-02-12: qty 1
  Filled 2015-02-12 (×2): qty 4
  Filled 2015-02-12 (×3): qty 1
  Filled 2015-02-12 (×3): qty 4
  Filled 2015-02-12: qty 1
  Filled 2015-02-12 (×2): qty 4
  Filled 2015-02-12: qty 1
  Filled 2015-02-12 (×4): qty 4

## 2015-02-12 MED ORDER — ONDANSETRON HCL 4 MG PO TABS: 4.0000 mg | ORAL_TABLET | Freq: Four times a day (QID) | ORAL | Status: DC | PRN

## 2015-02-12 MED ORDER — ENOXAPARIN SODIUM 40 MG/0.4ML ~~LOC~~ SOLN
40.0000 mg | Freq: Every day | SUBCUTANEOUS | Status: DC
  Administered 2015-02-13 – 2015-02-18 (×6): 40 mg via SUBCUTANEOUS
  Filled 2015-02-12 (×8): qty 0.4

## 2015-02-12 MED ORDER — FOLIC ACID 1 MG PO TABS
1.0000 mg | ORAL_TABLET | Freq: Every day | ORAL | Status: DC
  Administered 2015-02-12 – 2015-02-13 (×2): 1 mg via ORAL
  Filled 2015-02-12 (×2): qty 1

## 2015-02-12 MED ORDER — INSULIN ASPART 100 UNIT/ML ~~LOC~~ SOLN: 0.0000 [IU] | Freq: Every day | SUBCUTANEOUS | Status: DC

## 2015-02-12 MED ORDER — ALBUMIN HUMAN 25 % IV SOLN
25.0000 g | Freq: Once | INTRAVENOUS | Status: AC
  Administered 2015-02-12: 25 g via INTRAVENOUS
  Filled 2015-02-12: qty 100

## 2015-02-12 NOTE — Progress Notes (Signed)
About 4.5 L serous return in fluid removed on admission. Fluid analysis is negative for SBP.

## 2015-02-12 NOTE — Progress Notes (Signed)
Curtis Estrada is a 59 y.o. male patient admitted from ED awake, alert - oriented  X 3 - no acute distress noted.  VSS - Blood pressure 129/95, pulse 116, temperature 98.1 F (36.7 C), temperature source Oral, resp. rate 16, height  (1.676 m), weight 90.175 kg (198 lb 12.8 oz), SpO2 95 %.    IV in place, occlusive dsg intact without redness.  Orientation to room, and floor completed with information packet given to patient/family.  Patient declined safety video at this time.  Admission INP armband ID verified with patient/family, and in place.   SR up x 2, fall assessment complete, with patient and family able to verbalize understanding of risk associated with falls, and verbalized understanding to call nsg before up out of bed.  Call light within reach, patient able to voice, and demonstrate understanding.  Dressing site to left abdomen clean dry and intact from paracentesis. No evidence of skin break down noted on exam.     Will cont to eval and treat per MD orders.  L'ESPERANCE, Thornell Mule, RN 02/12/2015 11:36 AM

## 2015-02-12 NOTE — ED Notes (Signed)
Pt coming from home with swelling X3 weeks. Pt has severe swelling in the abdomen as well as bilaterally in the legs. Pt also reporting cough. Denies shortness of breath. No hx of CHF.

## 2015-02-12 NOTE — ED Provider Notes (Signed)
CSN: [id number]     Arrival date & time 02/12/15  0845 History   First MD Initiated Contact with Patient 02/12/15 [phone]     Chief Complaint  Patient presents with  . Cough  . Leg Swelling     (Consider location/radiation/quality/duration/timing/severity/associated sxs/prior Treatment) HPI   Blood pressure 165/120, pulse 119, temperature 98.4 F (36.9 C), temperature source Oral, resp. rate 24, height 5\' 6"  (1.676 m), weight 210 lb 8 oz (95.482 kg), SpO2 100 %.  Curtis Estrada is a 59 y.o. male with past medical history significant for alcoholic hepatitis, hepatitis C, insulin dependent diabetes, hypertension, gastric ulcer complaining of increasing ascites, peripheral edema, shortness of breath worsening over the course of 3 weeks. Patient denies fever, chills, vomiting. His last alcohol intake was 3 days ago, he has no history of seizure or hallucination from alcohol withdrawal. He's had single peritoneal tap in the past. Follows at the [place]. No history of varices. Chart review shows patient was admitted for decompensated liver failure with SBP in March of this year.   Past Medical History  Diagnosis Date  . Hypertension   . Diabetes mellitus without complication   . HCV (hepatitis C virus)   . 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 ETOH abuse   . Ascites 01/2013  . Hematoma 01/2013    posterior right flank from a fall  . 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   . Anemia 02/2013  . Hyponatremia 01/2013  . Unspecified constipation 04/04/2013  . H pylori ulcer 03/05/2013  . Gastric ulcer with hemorrhage 03/03/2013  . Chronic wound of extremity-right great toe 03/03/2013  . Acute blood loss anemia 03/03/2013  . Spontaneous bacterial peritonitis 09/20/2014   Past Surgical History  Procedure Laterality Date  . Hernia repair    . Tonsillectomy    . Esophagogastroduodenoscopy N/A 03/03/2013    Procedure: ESOPHAGOGASTRODUODENOSCOPY (EGD);  Surgeon: Beverley Fiedler, MD;  Location:  [facility] ENDOSCOPY;  Service: Gastroenterology;  Laterality: N/A;  Bedside   Family History  Problem Relation Age of Onset  . Dementia Mother   . Cancer - Other Father    History  Substance Use Topics  . Smoking status: Current Every Day Smoker -- 0.25 packs/day for 45 years    Types: Cigarettes  . Smokeless tobacco: Never Used  . Alcohol Use: Yes     Comment: Three 40 oz cans per day, has drank since age 29    Review of Systems  10 systems reviewed and found to be negative, except as noted in the HPI.  Allergies  Penicillins  Home Medications   Prior to Admission medications   Medication Sig Start Date End Date Taking? Authorizing Provider  albuterol (PROVENTIL HFA;VENTOLIN HFA) 108 (90 BASE) MCG/ACT inhaler Inhale 2 puffs into the lungs every 2 (two) hours as needed for wheezing or shortness of breath (or coughing). 05/30/14  Yes Dione Booze, MD  insulin aspart (NOVOLOG) 100 UNIT/ML injection Inject 0-6 Units into the skin 3 (three) times daily with meals. 0-6 Units, Subcutaneous, 3 times daily with meals  CBG < 70: implement hypoglycemia protocol; CBG 70 - 120: 0 units; CBG 121 - 150: 1 unit; CBG 151 - 200: 2 units; CBG 201 - 250: 3 units; CBG 251 - 300: 5 units; CBG 301 - 350: 7 units; CBG 351 - 400: 9 units; CBG > 400: call MD 03/10/13  Yes Maretta Bees, MD  insulin detemir (LEVEMIR) 100 UNIT/ML injection  Inject 16 Units into the skin at bedtime. 03/10/13  Yes Shanker Levora Dredge, MD  folic acid (FOLVITE) 1 MG tablet Take 1 tablet (1 mg total) by mouth daily. 09/21/14   Maryruth Bun Rama, MD  furosemide (LASIX) 40 MG tablet Take 1 tablet (40 mg total) by mouth 2 (two) times daily. Patient not taking: Reported on 05/30/2014 03/10/13   Maretta Bees, MD  gabapentin (NEURONTIN) 300 MG capsule Take 600 mg by mouth 3 (three) times daily.    Historical Provider, MD  lactulose (CHRONULAC) 10 GM/15ML solution Take 20 g by mouth 2 (two) times daily as needed for mild constipation.     Historical Provider, MD  levofloxacin (LEVAQUIN) 750 MG tablet Take 1 tablet (750 mg total) by mouth daily. 09/21/14   Maryruth Bun Rama, MD  Magnesium Oxide 420 MG TABS Take 1 tablet by mouth daily.    Historical Provider, MD  metFORMIN (GLUCOPHAGE) 1000 MG tablet Take 1,000 mg by mouth 2 (two) times daily with a meal.    Historical Provider, MD  metroNIDAZOLE (FLAGYL) 500 MG tablet Take 1 tablet (500 mg total) by mouth every 8 (eight) hours. 09/21/14   Maryruth Bun Rama, MD  Multiple Vitamin (MULTIVITAMIN WITH MINERALS) TABS tablet Take 1 tablet by mouth daily. 09/21/14   Maryruth Bun Rama, MD  nortriptyline (PAMELOR) 50 MG capsule Take 50 mg by mouth at bedtime.    Historical Provider, MD  pantoprazole (PROTONIX) 40 MG tablet Take 40 mg by mouth 2 (two) times daily.     Historical Provider, MD  polyethylene glycol (MIRALAX / GLYCOLAX) packet Take 17 g by mouth daily.    Historical Provider, MD  polyvinyl alcohol (LIQUIFILM TEARS) 1.4 % ophthalmic solution Place 1 drop into both eyes daily as needed (dry eyes).     Historical Provider, MD  propranolol (INDERAL) 10 MG tablet Take 1 tablet (10 mg total) by mouth 3 (three) times daily. 03/10/13   Shanker Levora Dredge, MD  rifaximin (XIFAXAN) 550 MG TABS tablet Take 1 tablet (550 mg total) by mouth 2 (two) times daily at 10 AM and 5 PM. Patient not taking: Reported on 05/30/2014 03/10/13   Maretta Bees, MD  sildenafil (VIAGRA) 100 MG tablet Take 100 mg by mouth daily as needed for erectile dysfunction.    Historical Provider, MD  spironolactone (ALDACTONE) 100 MG tablet Take 1 tablet (100 mg total) by mouth daily. Patient not taking: Reported on 05/30/2014 03/10/13   Maretta Bees, MD   BP 142/100 mmHg  Pulse 107  Temp(Src) 98.1 F (36.7 C) (Oral)  Resp 16  Ht 5\' 6"  (1.676 m)  Wt 198 lb 12.8 oz (90.175 kg)  BMI 32.10 kg/m2  SpO2 96% Physical Exam  Constitutional: He is oriented to person, place, and time. He appears well-developed and  well-nourished. No distress.  HENT:  Head: Normocephalic.  Mouth/Throat: Oropharynx is clear and moist.  Eyes: Conjunctivae and EOM are normal.  Very mild scleral icterus  Cardiovascular: Regular rhythm and intact distal pulses.   tachycardia  Pulmonary/Chest: Effort normal and breath sounds normal. No stridor.  coughing  Abdominal: Soft. Bowel sounds are normal. He exhibits distension. He exhibits no mass. There is tenderness. There is no rebound and no guarding.  + caput medusae  Tense distention, no focal tenderness  Musculoskeletal: Normal range of motion. He exhibits edema.  Anasarca and 4+ pitting edema bilaterally  Neurological: He is alert and oriented to person, place, and time.  Psychiatric: He has  a normal mood and affect.  Nursing note and vitals reviewed.   ED Course  Procedures (including critical care time) Labs Review Labs Reviewed  PROTIME-INR - Abnormal; Notable for the following:    Prothrombin Time 17.5 (*)    All other components within normal limits  COMPREHENSIVE METABOLIC PANEL - Abnormal; Notable for the following:    Glucose, Bld 125 (*)    BUN <5 (*)    Calcium 8.8 (*)    Albumin 2.2 (*)    Total Bilirubin 1.4 (*)    All other components within normal limits  AMMONIA - Abnormal; Notable for the following:    Ammonia 57 (*)    All other components within normal limits  CBC WITH DIFFERENTIAL/PLATELET - Abnormal; Notable for the following:    RBC 6.14 (*)    MCV 70.0 (*)    MCH 22.6 (*)    RDW 18.0 (*)    Neutrophils Relative % 41 (*)    Monocytes Relative 23 (*)    Monocytes Absolute 1.3 (*)    All other components within normal limits  BODY FLUID CELL COUNT WITH DIFFERENTIAL - Abnormal; Notable for the following:    Color, Fluid YELLOW (*)    Appearance, Fluid CLOUDY (*)    Monocyte-Macrophage-Serous Fluid 17 (*)    All other components within normal limits  LACTATE DEHYDROGENASE, BODY FLUID - Abnormal; Notable for the following:    LD,  Fluid 46 (*)    All other components within normal limits  GLUCOSE, CAPILLARY - Abnormal; Notable for the following:    Glucose-Capillary 107 (*)    All other components within normal limits  I-STAT CHEM 8, ED - Abnormal; Notable for the following:    BUN 4 (*)    Glucose, Bld 123 (*)    Hemoglobin 17.3 (*)    All other components within normal limits  CULTURE, BODY FLUID-BOTTLE  GRAM STAIN  BODY FLUID CULTURE  BODY FLUID CULTURE  APTT  BRAIN NATRIURETIC PEPTIDE  TROPONIN I  ALBUMIN, FLUID  GLUCOSE, SEROUS FLUID  PROTEIN, BODY FLUID  LACTATE DEHYDROGENASE, BODY FLUID  GLUCOSE, PERITONEAL FLUID  PROTEIN, BODY FLUID  ALBUMIN, FLUID  BODY FLUID CELL COUNT WITH DIFFERENTIAL  ALBUMIN, FLUID  LACTATE DEHYDROGENASE, BODY FLUID  PROTEIN, BODY FLUID  BODY FLUID CELL COUNT WITH DIFFERENTIAL  GLUCOSE, SEROUS FLUID  I-STAT CG4 LACTIC ACID, ED  I-STAT CG4 LACTIC ACID, ED    Imaging Review Dg Chest 2 View  02/12/2015   CLINICAL DATA:  Pain in the right ribs after fall yesterday. Initial encounter.  EXAM: CHEST  2 VIEW  COMPARISON:  09/19/2014  FINDINGS: Low lung volumes with bilateral linear opacities, increased from previous. There is no edema, consolidation, effusion, or pneumothorax.  No indication of displaced rib fracture.  Normal heart size and stable mild aortic tortuosity.  IMPRESSION: 1. No traumatic findings. 2. Low lung volumes with bilateral atelectasis.   Electronically Signed   By: Marnee Spring M.D.   On: 02/12/2015 09:57     EKG Interpretation   Date/Time:  Monday February 12 2015 08:58:50 EDT Ventricular Rate:  119 PR Interval:  162 QRS Duration: 73 QT Interval:  306 QTC Calculation: 430 R Axis:   59 Text Interpretation:  Sinus tachycardia Borderline T abnormalities,  inferior leads No significant change since last tracing Confirmed by  Gwendolyn Grant  MD, BLAIR ([id number]) on 02/12/2015 10:13:56 AM      MDM   Final diagnoses:  Cough  Decompensated hepatic cirrhosis  Dyspnea  Uncomplicated alcohol dependence  Bilateral edema of lower extremity    Filed Vitals:   02/12/15 1127 02/12/15 1133 02/12/15 1331 02/12/15 1446  BP:  129/95 130/89 142/100  Pulse:  116 109 107  Temp:  98.1 F (36.7 C) 97.9 F (36.6 C) 98.1 F (36.7 C)  TempSrc:  Oral Oral Oral  Resp:  16  16  Height: 5\' 6"  (1.676 m)     Weight: 198 lb 12.8 oz (90.175 kg)     SpO2:  95% 94% 96%    Medications  LORazepam (ATIVAN) injection 1 mg (not administered)  lidocaine (PF) (XYLOCAINE) 1 % injection (not administered)  enoxaparin (LOVENOX) injection 40 mg (40 mg Subcutaneous Not Given 02/12/15 1333)  sodium chloride 0.9 % injection 3 mL (3 mLs Intravenous Not Given 02/12/15 1145)  sodium chloride 0.9 % injection 3 mL (0 mLs Intravenous Duplicate 02/12/15 1145)  sodium chloride 0.9 % injection 3 mL (not administered)  0.9 %  sodium chloride infusion (not administered)  ondansetron (ZOFRAN) tablet 4 mg (not administered)    Or  ondansetron (ZOFRAN) injection 4 mg (not administered)  acetaminophen (TYLENOL) tablet 650 mg (not administered)    Or  acetaminophen (TYLENOL) suppository 650 mg (not administered)  insulin aspart (novoLOG) injection 0-15 Units (0 Units Subcutaneous Not Given 02/12/15 1236)  insulin aspart (novoLOG) injection 0-5 Units (not administered)  folic acid (FOLVITE) tablet 1 mg (1 mg Oral Given 02/12/15 1332)  lactulose (CHRONULAC) 10 GM/15ML solution 20 g (not administered)  pantoprazole (PROTONIX) EC tablet 40 mg (40 mg Oral Given 02/12/15 1332)  gabapentin (NEURONTIN) capsule 600 mg (600 mg Oral Given 02/12/15 1332)  albuterol (PROVENTIL) (2.5 MG/3ML) 0.083% nebulizer solution 3 mL (not administered)  multivitamin with minerals tablet 1 tablet (not administered)  insulin detemir (LEVEMIR) injection 16 Units (not administered)  nortriptyline (PAMELOR) capsule 50 mg (not administered)  furosemide (LASIX) tablet 80 mg (not administered)  polyethylene glycol (MIRALAX /  GLYCOLAX) packet 17 g (not administered)  propranolol (INDERAL) tablet 10 mg (10 mg Oral Given 02/12/15 1332)  rifaximin (XIFAXAN) tablet 550 mg (550 mg Oral Given 02/12/15 1332)  spironolactone (ALDACTONE) tablet 100 mg (100 mg Oral Given 02/12/15 1332)  polyvinyl alcohol (LIQUIFILM TEARS) 1.4 % ophthalmic solution 1 drop (not administered)  magnesium oxide (MAG-OX) tablet 400 mg (not administered)  azithromycin (ZITHROMAX) tablet 500 mg (500 mg Oral Given 02/12/15 1406)    Followed by  azithromycin (ZITHROMAX) tablet 250 mg (not administered)  guaiFENesin-dextromethorphan (ROBITUSSIN DM) 100-10 MG/5ML syrup 5 mL (not administered)  morphine 4 MG/ML injection 4 mg (4 mg Intravenous Given 02/12/15 0925)  lactulose (CHRONULAC) 10 GM/15ML solution 20 g (20 g Oral Given 02/12/15 1022)  albumin human 25 % solution 25 g (25 g Intravenous Given 02/12/15 1348)    Curtis Estrada is a pleasant 59 y.o. male presenting with anasarca and cough, patient has history of liver failure secondary to cirrhosis from alcohol and hep C. He follows at the [place]. Edema has been worsening over the course of several weeks. Patient states he is compliant with lactulose and diaphoretic. Denies fever, chills. No signs of SBP, no indication for antibiotics.Patient is coughing, think this is just secondary to the difficulty breathing from the pressure of the ascites.  EKG shows a sinus tachycardia, INR is mildly prolonged at 1.42. He has normal renal function ammonia mildly elevated at 57. No anemia although his MCV is low.  This will be an unassigned admission to [facility] hospitalist Dr. Gonzella Lex  Patient will go to ultrasound for paracentesis, therapeutic and diagnostic, have given limits of 4.5 L, admitting hospitalist has written for albumen repletion.         Wynetta Emery, PA-C 02/12/15 1532  Elwin Mocha, MD 02/12/15 1556

## 2015-02-12 NOTE — Procedures (Signed)
Successful US guided paracentesis from [facility].  Yielded 4.5 liters of serous fluid.  No immediate complications.  Pt tolerated well.   Specimen was sent for labs.  Pattricia Boss D PA-C 02/12/2015 12:10 PM

## 2015-02-12 NOTE — Progress Notes (Signed)
Report received from [facility] for admission to [phone]

## 2015-02-12 NOTE — H&P (Signed)
[facility] History and Physical  Lancelot Alyea ZOX:[id number] DOB: 1955/11/30 DOA: 02/12/2015  Referring physician: ED PCP: [facility]   Chief Complaint:  Abdominal distention with leg swellings x3 weeks Cough x1 week  HPI:  59 year old male with decompensated liver cirrhosis secondary to hepatitis C and alcohol with portal hypertension, coagulopathy, hospitalization in March of this year with decompensated liver cirrhosis with ascites and SBP presented to the ED with progressive abdominal distention for the past [redacted] weeks along with bilateral leg swellings. He also reports cough with mucus and some dyspnea with exertion. He follows at the [place] and reports being compliant with his medications. He denies any recent travel or illness. Patient denies headache, dizziness, fever, chills, nausea , vomiting, chest pain, palpitations,  abdominal pain, bowel or urinary symptoms.  Course in the ED Patient was tachycardic up to 130, had elevated blood pressure of 165/120 mmHg. Was tachypneic in the 20s. Blood will done showed normal CBC. Chemistry was normal with normal LFTs except for low albumin. Troponin was negative. INR was 1.42. Glucose of 123. Chest x-ray showed bilateral atelectasis without any infiltrate. Patient sent for low volume paracentesis and hospitalists admission requested.  Review of Systems:  Constitutional: Denies fever, chills, diaphoresis, appetite change and fatigue.  HEENT: Denies sore hearing symptoms, difficulty swallowing, congestion, sore throat, neck pain or stiffness Respiratory: Cough, dyspnea on exertion, Denies  chest tightness,  and wheezing.   Cardiovascular: Denies chest pain, palpitations and leg swelling++.  Gastrointestinal: Abdominal distention+++, Denies nausea, vomiting, abdominal pain, diarrhea, constipation, blood in stool  Genitourinary: Denies dysuria,, hematuria, flank pain and difficulty urinating.  Endocrine: Denies: hot or cold intolerance,  polyuria, polydipsia. Musculoskeletal: Denies myalgias, back pain, joint pain or swelling Skin: Denies pallor, rash and wound.  Neurological: Denies dizziness, seizures, syncope, weakness, light-headedness, numbness and headaches.  Hematological: Denies adenopathy.  Psychiatric/Behavioral: Denies confusion   Past Medical History  Diagnosis Date  . Hypertension   . Diabetes mellitus without complication   . HCV (hepatitis C virus)   . 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 ETOH abuse   . Ascites 01/2013  . Hematoma 01/2013    posterior right flank from a fall  . 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   . Anemia 02/2013  . Hyponatremia 01/2013  . Unspecified constipation 04/04/2013  . H pylori ulcer 03/05/2013  . Gastric ulcer with hemorrhage 03/03/2013  . Chronic wound of extremity-right great toe 03/03/2013  . Acute blood loss anemia 03/03/2013  . Spontaneous bacterial peritonitis 09/20/2014   Past Surgical History  Procedure Laterality Date  . Hernia repair    . Tonsillectomy    . Esophagogastroduodenoscopy N/A 03/03/2013    Procedure: ESOPHAGOGASTRODUODENOSCOPY (EGD);  Surgeon: Beverley Fiedler, MD;  Location: [facility] ENDOSCOPY;  Service: Gastroenterology;  Laterality: N/A;  Bedside   Social History:  reports that he has been smoking Cigarettes.  He has a 11.25 pack-year smoking history. He has never used smokeless tobacco. He reports that he drinks alcohol. He reports that he does not use illicit drugs.  Allergies  Allergen Reactions  . Penicillins     Childhood Has received Rocephin w/o reaction    Family History  Problem Relation Age of Onset  . Dementia Mother   . Cancer - Other Father     Prior to Admission medications   Medication Sig Start Date End Date Taking? Authorizing Provider  albuterol (PROVENTIL HFA;VENTOLIN HFA) 108 (90 BASE) MCG/ACT inhaler Inhale  2 puffs into the lungs every 2 (two) hours as needed for wheezing or shortness of breath (or coughing).  05/30/14  Yes Dione Booze, MD  insulin aspart (NOVOLOG) 100 UNIT/ML injection Inject 0-6 Units into the skin 3 (three) times daily with meals. 0-6 Units, Subcutaneous, 3 times daily with meals  CBG < 70: implement hypoglycemia protocol; CBG 70 - 120: 0 units; CBG 121 - 150: 1 unit; CBG 151 - 200: 2 units; CBG 201 - 250: 3 units; CBG 251 - 300: 5 units; CBG 301 - 350: 7 units; CBG 351 - 400: 9 units; CBG > 400: call MD 03/10/13  Yes Shanker Levora Dredge, MD  insulin detemir (LEVEMIR) 100 UNIT/ML injection Inject 16 Units into the skin at bedtime. 03/10/13  Yes Shanker Levora Dredge, MD  folic acid (FOLVITE) 1 MG tablet Take 1 tablet (1 mg total) by mouth daily. 09/21/14   Maryruth Bun Rama, MD  furosemide (LASIX) 40 MG tablet Take 1 tablet (40 mg total) by mouth 2 (two) times daily. Patient not taking: Reported on 05/30/2014 03/10/13   Maretta Bees, MD  gabapentin (NEURONTIN) 300 MG capsule Take 600 mg by mouth 3 (three) times daily.    Historical Provider, MD  lactulose (CHRONULAC) 10 GM/15ML solution Take 20 g by mouth 2 (two) times daily as needed for mild constipation.    Historical Provider, MD  levofloxacin (LEVAQUIN) 750 MG tablet Take 1 tablet (750 mg total) by mouth daily. 09/21/14   Maryruth Bun Rama, MD  Magnesium Oxide 420 MG TABS Take 1 tablet by mouth daily.    Historical Provider, MD  metFORMIN (GLUCOPHAGE) 1000 MG tablet Take 1,000 mg by mouth 2 (two) times daily with a meal.    Historical Provider, MD  metroNIDAZOLE (FLAGYL) 500 MG tablet Take 1 tablet (500 mg total) by mouth every 8 (eight) hours. 09/21/14   Maryruth Bun Rama, MD  Multiple Vitamin (MULTIVITAMIN WITH MINERALS) TABS tablet Take 1 tablet by mouth daily. 09/21/14   Maryruth Bun Rama, MD  nortriptyline (PAMELOR) 50 MG capsule Take 50 mg by mouth at bedtime.    Historical Provider, MD  pantoprazole (PROTONIX) 40 MG tablet Take 40 mg by mouth 2 (two) times daily.     Historical Provider, MD  polyethylene glycol (MIRALAX / GLYCOLAX)  packet Take 17 g by mouth daily.    Historical Provider, MD  polyvinyl alcohol (LIQUIFILM TEARS) 1.4 % ophthalmic solution Place 1 drop into both eyes daily as needed (dry eyes).     Historical Provider, MD  propranolol (INDERAL) 10 MG tablet Take 1 tablet (10 mg total) by mouth 3 (three) times daily. 03/10/13   Shanker Levora Dredge, MD  rifaximin (XIFAXAN) 550 MG TABS tablet Take 1 tablet (550 mg total) by mouth 2 (two) times daily at 10 AM and 5 PM. Patient not taking: Reported on 05/30/2014 03/10/13   Maretta Bees, MD  sildenafil (VIAGRA) 100 MG tablet Take 100 mg by mouth daily as needed for erectile dysfunction.    Historical Provider, MD  spironolactone (ALDACTONE) 100 MG tablet Take 1 tablet (100 mg total) by mouth daily. Patient not taking: Reported on 05/30/2014 03/10/13   Maretta Bees, MD     Physical Exam:  Filed Vitals:   02/12/15 1036 02/12/15 1050 02/12/15 1100 02/12/15 1111  BP: 118/98 128/87 122/83 127/93  Pulse:    115  Temp:      TempSrc:      Resp:    16  Height:  Weight:      SpO2:    99%    Constitutional: Vital signs reviewed.  Middle aged male in no acute distress  HEENT: no pallor, no icterus, moist oral mucosa, no cervical lymphadenopathy, no JVD Cardiovascular: RRR, S1 normal, S2 normal, no MRG Chest: CTAB, no wheezes, rales, or rhonchi Abdominal: Distended abdomen with ascites, nontender,  Ext: 2+ pitting edema b/l Neurological: Alert and oriented, no tremors,  Labs on Admission:  Basic Metabolic Panel:  Recent Labs Lab 02/12/15 0907 02/12/15 0926  NA 136 139  K 4.1 4.1  CL 102 101  CO2 26  --   GLUCOSE 125* 123*  BUN <5* 4*  CREATININE 0.81 0.70  CALCIUM 8.8*  --    Liver Function Tests:  Recent Labs Lab 02/12/15 0907  AST 37  ALT 20  ALKPHOS 106  BILITOT 1.4*  PROT 7.2  ALBUMIN 2.2*   No results for input(s): LIPASE, AMYLASE in the last 168 hours.  Recent Labs Lab 02/12/15 0907  AMMONIA 57*   CBC:  Recent  Labs Lab 02/12/15 0907 02/12/15 0926  WBC 5.7  --   NEUTROABS 2.4  --   HGB 13.9 17.3*  HCT 43.0 51.0  MCV 70.0*  --   PLT 271  --    Cardiac Enzymes:  Recent Labs Lab 02/12/15 0907  TROPONINI <0.03   BNP: Invalid input(s): POCBNP CBG: No results for input(s): GLUCAP in the last 168 hours.  Radiological Exams on Admission: Dg Chest 2 View  02/12/2015   CLINICAL DATA:  Pain in the right ribs after fall yesterday. Initial encounter.  EXAM: CHEST  2 VIEW  COMPARISON:  09/19/2014  FINDINGS: Low lung volumes with bilateral linear opacities, increased from previous. There is no edema, consolidation, effusion, or pneumothorax.  No indication of displaced rib fracture.  Normal heart size and stable mild aortic tortuosity.  IMPRESSION: 1. No traumatic findings. 2. Low lung volumes with bilateral atelectasis.   Electronically Signed   By: Marnee Spring M.D.   On: 02/12/2015 09:57    EKG: Sinus tachycardia at 119, T-wave inversion in inferior leads  Assessment/Plan  Principal Problem:   Acute Decompensated HCV / alcoholic cirrhosis Patient presenting with severe ascites shortness of breath and increased leg edema for the past 3 weeks. Patient underwent therapeutic paracentesis . Follow body fluid cell count, Gram stain and culture. Has history of SBP but currently not in abdominal pain or fever.  Increase home dose lasix and aldactone (80 mg bid and 100 mg bid respectively). Continue propanolol and lactulose. Monitor daily I's and O. Fluid restriction to 1500 cc/ day. -Patient continues to drink alcohol occasionally and has been counseled to stop. Will order her dose of IV albumin. -Check INR. -Likely need repeat centesis in 2 days. -Vision follows at [place].  Active Problems: Tachycardia and shortness of breath Likely secondary to massive ascites. Monitor on telemetry. EKG unremarkable except for this tachycardia but no new changes.    Essential hypertension, benign Blood pressure  stable.    Uncontrolled type 2 diabetes mellitus Last A1c checked in March here was 9.3. Resume home dose Lantus and place on sliding scale insulin. Hold metformin     Tobacco abuse Counseled on smoking cessation.  Cough with possible acute bronchitis Chest x-ray negative for infiltrate. Place on Z-Pak. Ordered antitussives.  Diet:diabetic  DVT prophylaxis: sq lovenox   Code Status: full code Family Communication: None at bedside Disposition Plan: Home once clinically improved  Merrick Feutz [facility] Pager [phone]  Total time spent on admission :70 minutes  If 7PM-7AM, please contact night-coverage [email or web address] Password [email or web address] 02/12/2015, 11:20 AM

## 2015-02-13 DIAGNOSIS — I10 Essential (primary) hypertension: Secondary | ICD-10-CM

## 2015-02-13 DIAGNOSIS — J209 Acute bronchitis, unspecified: Secondary | ICD-10-CM | POA: Insufficient documentation

## 2015-02-13 DIAGNOSIS — Z720 Tobacco use: Secondary | ICD-10-CM

## 2015-02-13 LAB — GLUCOSE, CAPILLARY
GLUCOSE-CAPILLARY: 106 mg/dL — AB (ref 65–99)
GLUCOSE-CAPILLARY: 172 mg/dL — AB (ref 65–99)
Glucose-Capillary: 164 mg/dL — ABNORMAL HIGH (ref 65–99)
Glucose-Capillary: 134 mg/dL — ABNORMAL HIGH (ref 65–99)

## 2015-02-13 LAB — BASIC METABOLIC PANEL
ANION GAP: 6 (ref 5–15)
BUN: 6 mg/dL (ref 6–20)
CHLORIDE: 101 mmol/L (ref 101–111)
CO2: 25 mmol/L (ref 22–32)
CREATININE: 0.76 mg/dL (ref 0.61–1.24)
Calcium: 8.3 mg/dL — ABNORMAL LOW (ref 8.9–10.3)
GFR calc Af Amer: >60 mL/min (ref >60)
GLUCOSE: 102 mg/dL — AB (ref 65–99)
POTASSIUM: 4.2 mmol/L (ref 3.5–5.1)
Sodium: 132 mmol/L — ABNORMAL LOW (ref 135–145)

## 2015-02-13 MED ORDER — LORAZEPAM 2 MG/ML IJ SOLN: 0.0000 mg | Freq: Two times a day (BID) | INTRAMUSCULAR | Status: AC

## 2015-02-13 MED ORDER — THIAMINE HCL 100 MG/ML IJ SOLN: 100.0000 mg | Freq: Every day | INTRAMUSCULAR | Status: DC

## 2015-02-13 MED ORDER — ADULT MULTIVITAMIN W/MINERALS CH
1.0000 | ORAL_TABLET | Freq: Every day | ORAL | Status: DC
  Administered 2015-02-14 – 2015-02-19 (×6): 1 via ORAL
  Filled 2015-02-13 (×6): qty 1

## 2015-02-13 MED ORDER — FOLIC ACID 1 MG PO TABS
1.0000 mg | ORAL_TABLET | Freq: Every day | ORAL | Status: DC
  Administered 2015-02-14 – 2015-02-19 (×6): 1 mg via ORAL
  Filled 2015-02-13 (×6): qty 1

## 2015-02-13 MED ORDER — LORAZEPAM 2 MG/ML IJ SOLN: 1.0000 mg | Freq: Four times a day (QID) | INTRAMUSCULAR | Status: AC | PRN

## 2015-02-13 MED ORDER — VITAMIN B-1 100 MG PO TABS
100.0000 mg | ORAL_TABLET | Freq: Every day | ORAL | Status: DC
  Administered 2015-02-14 – 2015-02-19 (×6): 100 mg via ORAL
  Filled 2015-02-13 (×6): qty 1

## 2015-02-13 MED ORDER — LORAZEPAM 2 MG/ML IJ SOLN
0.0000 mg | Freq: Four times a day (QID) | INTRAMUSCULAR | Status: AC
Start: 2015-02-14 — End: 2015-02-15
  Administered 2015-02-14: 1 mg via INTRAVENOUS
  Filled 2015-02-13: qty 1

## 2015-02-13 MED ORDER — LORAZEPAM 1 MG PO TABS
1.0000 mg | ORAL_TABLET | Freq: Four times a day (QID) | ORAL | Status: AC | PRN
  Administered 2015-02-14: 1 mg via ORAL
  Filled 2015-02-13 (×2): qty 1

## 2015-02-13 MED ORDER — BENZONATATE 100 MG PO CAPS
200.0000 mg | ORAL_CAPSULE | Freq: Three times a day (TID) | ORAL | Status: DC
  Administered 2015-02-13 – 2015-02-19 (×17): 200 mg via ORAL
  Filled 2015-02-13 (×17): qty 2

## 2015-02-13 NOTE — Progress Notes (Signed)
Initial Nutrition Assessment  DOCUMENTATION CODES:   Obesity unspecified  INTERVENTION:   Glucerna Shake po BID, each supplement provides 220 kcal and 10 grams of protein  NUTRITION DIAGNOSIS:   Increased nutrient needs related to chronic illness as evidenced by estimated needs.  GOAL:   Patient will meet greater than or equal to 90% of their needs  MONITOR:   PO intake, Supplement acceptance, Labs, Weight trends, Skin, I & O's  REASON FOR ASSESSMENT:   Consult Assessment of nutrition requirement/status  ASSESSMENT:   59 year old male with decompensated liver cirrhosis secondary to hepatitis C and alcohol with portal hypertension, coagulopathy, hospitalization in March of this year with decompensated liver cirrhosis with ascites and SBP presented to the ED with progressive abdominal distention for the past [redacted] weeks along with bilateral leg swellings. He also reports cough with mucus and some dyspnea with exertion. He follows at the [place] and reports being compliant with his medications. He denies any recent travel or illness.  Pt admitted with alcoholic cirrhosis.   Pt s/p paracentesis on 02/12/15; noted 4.5 L fluid removed.   Pt was sleeping soundly at time of visit; did not arouse to name being called.   Nutrition-Focused physical exam completed. Findings are no fat depletion, no muscle depletion, and mild edema. Noted abdominal distention on exam.  Pt is consuming meals well on a carb modified diet; noted 100% meal completion.  Wt hx reviewed. Noted UBW around 190#, however, fluctuations likely due to edema.   Labs reviewed: Na: 132 (on IV supplementation).   Diet Order:  Diet Carb Modified Fluid consistency:: Thin; Room service appropriate?: Yes; Fluid restriction:: 1200 mL Fluid  Skin:  Reviewed, no issues  Last BM:  02/12/15  Height:   Ht Readings from Last 1 Encounters:  02/12/15  (1.676 m)    Weight:   Wt Readings from Last 1 Encounters:  02/12/15 198  lb 12.8 oz (90.175 kg)    Ideal Body Weight:  64.5 kg  BMI:  Body mass index is 32.1 kg/(m^2).  Estimated Nutritional Needs:   Kcal:  2000-2200  Protein:  100-115 grams  Fluid:  >2.0 L  EDUCATION NEEDS:   No education needs identified at this time  Colette Dicamillo A. Mayford Knife, RD, LDN, CDE Pager: [phone] After hours Pager: [phone]

## 2015-02-13 NOTE — Progress Notes (Signed)
[facility] PROGRESS NOTE  Curtis Estrada ZOX:[id number] DOB: 1956/04/03 DOA: 02/12/2015 PCP: [facility]  Assessment/Plan: #1 decompensated HCV/alcoholic cirrhosis Patient had presented with severe ascites, shortness of breath, and increased lower extremity edema 3 weeks. Patient status post therapeutic paracentesis with negative Gram stain. Patient will likely need repeat paracentesis in the next 2-3 days. Continue current increased dose of Lasix and Aldactone. Continue propranolol and lactulose, xifaxan. Follow.  #2 shortness of breath and tachycardia Secondary to PROM #1. Clinical improvement.  #3 poorly controlled type 2 diabetes Hemoglobin A1c was 9.3 on 09/19/2014. CBGs ranged from 134-164. Continue Levemir and sliding scale insulin.  #4 probable bronchitis Continue azithromycin. Add Tessalon Perles for cough.  #5 hypertension Stable.  #6 tobacco abuse Tobacco cessation.  #7 history of alcohol use Place on Ativan withdrawal protocol.  #8 prophylaxis Lovenox for DVT prophylaxis.  Code Status: Full Family Communication: Updated patient . No family at bedside. Disposition Plan: Home when medically.   Consultants:  None  Procedures:  US guided Paracentesis 02/12/2015 Dr Fredia Sorrow  CXR 02/12/2015  Antibiotics:  None  HPI/Subjective: Patient c/o cough. Patient abdominal distension.  patient states shortness of breath has improved.   Objective: Filed Vitals:   02/13/15 0550  BP: 97/71  Pulse: 108  Temp: 98.5 F (36.9 C)  Resp: 18    Intake/Output Summary (Last 24 hours) at 02/13/15 1437 Last data filed at 02/12/15 2307  Gross per 24 hour  Intake    480 ml  Output    300 ml  Net    180 ml   Filed Weights   02/12/15 0900 02/12/15 1127  Weight: 95.482 kg (210 lb 8 oz) 90.175 kg (198 lb 12.8 oz)    Exam:   General:  NAD  Cardiovascular: RRR  Respiratory: CTAB anterior lung fields  Abdomen: Distended, NTTP, +BS,  Musculoskeletal:  No c/c. 2-3+ bilateral lower extremity edema. Some angiectasia on dorsum of the foot.   Data Reviewed: Basic Metabolic Panel:  Recent Labs Lab 02/12/15 0907 02/12/15 0926 02/13/15 0556  NA 136 139 132*  K 4.1 4.1 4.2  CL 102 101 101  CO2 26  --  25  GLUCOSE 125* 123* 102*  BUN <5* 4* 6  CREATININE 0.81 0.70 0.76  CALCIUM 8.8*  --  8.3*   Liver Function Tests:  Recent Labs Lab 02/12/15 0907  AST 37  ALT 20  ALKPHOS 106  BILITOT 1.4*  PROT 7.2  ALBUMIN 2.2*   No results for input(s): LIPASE, AMYLASE in the last 168 hours.  Recent Labs Lab 02/12/15 0907  AMMONIA 57*   CBC:  Recent Labs Lab 02/12/15 0907 02/12/15 0926  WBC 5.7  --   NEUTROABS 2.4  --   HGB 13.9 17.3*  HCT 43.0 51.0  MCV 70.0*  --   PLT 271  --    Cardiac Enzymes:  Recent Labs Lab 02/12/15 0907  TROPONINI <0.03   BNP (last 3 results)  Recent Labs  09/19/14 1920 02/12/15 0907  BNP 29.8 19.0    ProBNP (last 3 results) No results for input(s): PROBNP in the last 8760 hours.  CBG:  Recent Labs Lab 02/12/15 1201 02/12/15 1654 02/12/15 2208 02/13/15 0748 02/13/15 1154  GLUCAP 107* 133* 114* 106* 164*    Recent Results (from the past 240 hour(s))  Culture, body fluid-bottle     Status: None (Preliminary result)   Collection Time: 02/12/15 10:57 AM  Result Value Ref Range Status   Specimen Description FLUID ABDOMEN  PERITONEAL  Final   Special Requests BAA  Final   Culture NO GROWTH 1 DAY  Final   Report Status PENDING  Incomplete  Gram stain     Status: None   Collection Time: 02/12/15 10:57 AM  Result Value Ref Range Status   Specimen Description FLUID ABDOMEN PERITONEAL  Final   Special Requests NONE  Final   Gram Stain   Final    MODERATE WBC PRESENT, PREDOMINANTLY MONONUCLEAR NO ORGANISMS SEEN    Report Status 02/12/2015 FINAL  Final     Studies: Dg Chest 2 View  02/12/2015   CLINICAL DATA:  Pain in the right ribs after fall yesterday. Initial  encounter.  EXAM: CHEST  2 VIEW  COMPARISON:  09/19/2014  FINDINGS: Low lung volumes with bilateral linear opacities, increased from previous. There is no edema, consolidation, effusion, or pneumothorax.  No indication of displaced rib fracture.  Normal heart size and stable mild aortic tortuosity.  IMPRESSION: 1. No traumatic findings. 2. Low lung volumes with bilateral atelectasis.   Electronically Signed   By: Marnee Spring M.D.   On: 02/12/2015 09:57   US Paracentesis  02/12/2015   INDICATION: Ascites, request for paracentesis.  EXAM: ULTRASOUND-GUIDED PARACENTESIS  COMPARISON:  Paracentesis 09/20/14.  MEDICATIONS: None.  COMPLICATIONS: None immediate  TECHNIQUE: Informed written consent was obtained from the patient after a discussion of the risks, benefits and alternatives to treatment. A timeout was performed prior to the initiation of the procedure.  Initial ultrasound scanning demonstrates a large amount of ascites within the left upper abdominal quadrant. The left upper abdomen was prepped and draped in the usual sterile fashion. 1% lidocaine was used for local anesthesia.  Under direct ultrasound guidance, a 19 gauge, 7-cm, Yueh catheter was introduced. An ultrasound image was saved for documentation purposed. The paracentesis was performed. The catheter was removed and a dressing was applied. The patient tolerated the procedure well without immediate post procedural complication.  FINDINGS: A total of approximately 4.5 liters of serous fluid was removed. Samples were sent to the laboratory as requested by the clinical team.  IMPRESSION: Successful ultrasound-guided paracentesis yielding 4.5 liters of peritoneal fluid.  Read By:  Pattricia Boss PA-C   Electronically Signed   By: Irish Lack M.D.   On: 02/12/2015 12:10    Scheduled Meds: . azithromycin  250 mg Oral Daily  . enoxaparin (LOVENOX) injection  40 mg Subcutaneous Daily  . folic acid  1 mg Oral Daily  . furosemide  80 mg Oral BID  .  gabapentin  600 mg Oral TID  . insulin aspart  0-15 Units Subcutaneous TID WC  . insulin aspart  0-5 Units Subcutaneous QHS  . insulin detemir  16 Units Subcutaneous QHS  . LORazepam  1 mg Intravenous Once  . magnesium oxide  400 mg Oral Daily  . multivitamin with minerals  1 tablet Oral Daily  . nortriptyline  50 mg Oral QHS  . pantoprazole  40 mg Oral BID  . polyethylene glycol  17 g Oral Daily  . propranolol  10 mg Oral TID  . rifaximin  550 mg Oral BID  . sodium chloride  3 mL Intravenous Q12H  . sodium chloride  3 mL Intravenous Q12H  . spironolactone  100 mg Oral BID   Continuous Infusions:   Principal Problem:   Decompensated HCV cirrhosis Active Problems:   Cirrhosis, alcoholic/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   Hepatitis C   Ascites   Essential hypertension, benign  Uncontrolled type 2 diabetes mellitus   Dyspnea   Alcohol dependence   Tobacco abuse   Lower extremity edema    Time spent: 35 mins    [facility] MD [facility] Pager [phone]. If 7PM-7AM, please contact night-coverage at [email or web address], password [email or web address] 02/13/2015, 2:37 PM  LOS: 1 day

## 2015-02-14 DIAGNOSIS — J209 Acute bronchitis, unspecified: Secondary | ICD-10-CM

## 2015-02-14 LAB — COMPREHENSIVE METABOLIC PANEL
ALT: 17 U/L (ref 17–63)
AST: 34 U/L (ref 15–41)
Albumin: 2 g/dL — ABNORMAL LOW (ref 3.5–5.0)
Alkaline Phosphatase: 107 U/L (ref 38–126)
Anion gap: 5 (ref 5–15)
BUN: 6 mg/dL (ref 6–20)
CHLORIDE: 105 mmol/L (ref 101–111)
CO2: 24 mmol/L (ref 22–32)
CREATININE: 0.9 mg/dL (ref 0.61–1.24)
Calcium: 8.2 mg/dL — ABNORMAL LOW (ref 8.9–10.3)
GFR calc Af Amer: >60 mL/min (ref >60)
GFR calc non Af Amer: >60 mL/min (ref >60)
GLUCOSE: 163 mg/dL — AB (ref 65–99)
POTASSIUM: 4.3 mmol/L (ref 3.5–5.1)
Sodium: 134 mmol/L — ABNORMAL LOW (ref 135–145)
Total Bilirubin: 0.9 mg/dL (ref 0.3–1.2)
Total Protein: 6 g/dL — ABNORMAL LOW (ref 6.5–8.1)

## 2015-02-14 LAB — CBC
HCT: 37.8 % — ABNORMAL LOW (ref 39.0–52.0)
Hemoglobin: 12.4 g/dL — ABNORMAL LOW (ref 13.0–17.0)
MCH: 22.5 pg — AB (ref 26.0–34.0)
MCHC: 32.8 g/dL (ref 30.0–36.0)
MCV: 68.6 fL — ABNORMAL LOW (ref 78.0–100.0)
PLATELETS: 237 10*3/uL (ref 150–400)
RBC: 5.51 MIL/uL (ref 4.22–5.81)
RDW: 17.5 % — ABNORMAL HIGH (ref 11.5–15.5)
WBC: 7.3 10*3/uL (ref 4.0–10.5)

## 2015-02-14 LAB — GLUCOSE, CAPILLARY
GLUCOSE-CAPILLARY: 151 mg/dL — AB (ref 65–99)
GLUCOSE-CAPILLARY: 145 mg/dL — AB (ref 65–99)
Glucose-Capillary: 177 mg/dL — ABNORMAL HIGH (ref 65–99)
Glucose-Capillary: 126 mg/dL — ABNORMAL HIGH (ref 65–99)

## 2015-02-14 LAB — MAGNESIUM: Magnesium: 1.7 mg/dL (ref 1.7–2.4)

## 2015-02-14 LAB — PATHOLOGIST SMEAR REVIEW

## 2015-02-14 MED ORDER — FUROSEMIDE 10 MG/ML IJ SOLN
80.0000 mg | Freq: Two times a day (BID) | INTRAMUSCULAR | Status: DC
  Administered 2015-02-14 – 2015-02-16 (×4): 80 mg via INTRAVENOUS
  Filled 2015-02-14 (×5): qty 8

## 2015-02-14 NOTE — Care Management Note (Signed)
Case Management Note  Patient Details  Name: Calvin Jablonowski MRN: [id number] Date of Birth: 1955-09-05  Subjective/Objective:    NCM spoke with patient ,he lives alone, uses a rolling walker in the am, because it is hard for him to get out of bed in the am.  Patient has PCP with the [place], but he can not remember the MD name.  Patient states they mail his medications to him.  [facility], they will need 3 day notification for transport from hospital, but he states he can use a bus pass, NCM will let CSW know. NCM will cont to follow for dc needs.                 Action/Plan:   Expected Discharge Date:                  Expected Discharge Plan:  Home w Home Health Services  In-House Referral:     Discharge planning Services  CM Consult  Post Acute Care Choice:    Choice offered to:     DME Arranged:    DME Agency:     HH Arranged:    HH Agency:     Status of Service:  In process, will continue to follow  Medicare Important Message Given:    Date Medicare IM Given:    Medicare IM give by:    Date Additional Medicare IM Given:    Additional Medicare Important Message give by:     If discussed at Long Length of Stay Meetings, dates discussed:    Additional Comments:  Leone Haven, RN 02/14/2015, 2:49 PM

## 2015-02-14 NOTE — Progress Notes (Signed)
[facility] PROGRESS NOTE  Curtis Estrada ZOX:[id number] DOB: 1956-01-30 DOA: 02/12/2015 [facility]CP: [facility]  Brief history of present illness 59 year old African-American male with a past medical history of alcoholic liver cirrhosis presented with shortness of breath, abdominal distention, lower extremity edema. He was hospitalized. He underwent paracentesis. Currently on Lasix intravenously.  Assessment/Plan: #1 decompensated HCV/alcoholic cirrhosis Patient had presented with severe ascites, shortness of breath, and increased lower extremity edema 3 weeks. Patient status post therapeutic paracentesis on 8/1 with negative Gram stain. Patient will likely need repeat paracentesis in the next 1-2 days. Change to intravenous Lasix. Continue Aldactone. Continue propranolol and lactulose, xifaxan.   #2 shortness of breath and tachycardia Secondary to above.   #3 poorly controlled type 2 diabetes Hemoglobin A1c was 9.3 on 09/19/2014. CBGs ranged from 134-164. Continue Levemir and sliding scale insulin.  #4 probable bronchitis Continue azithromycin. Add Tessalon Perles for cough.  #5 hypertension Stable.  #6 tobacco abuse Tobacco cessation.  #7 history of alcohol use Place on Ativan withdrawal protocol.  #8 prophylaxis Lovenox for DVT prophylaxis.  Code Status: Full Family Communication: Updated patient. He has poor insight. Disposition Plan: Start mobilizing when he is bit more stable.   Consultants:  None  Procedures:  US guided Paracentesis 02/12/2015 Dr Fredia Sorrow  CXR 02/12/2015  Antibiotics:  None  Subjective: Patient feels better but still has difficulty breathing and abdominal discomfort.   Objective: Filed Vitals:   02/14/15 0523  BP: 107/74  Pulse: 98  Temp: 98.3 F (36.8 C)  Resp: 12    Intake/Output Summary (Last 24 hours) at 02/14/15 1220 Last data filed at 02/14/15 0359  Gross per 24 hour  Intake    290 ml  Output      0 ml  Net    290  ml   Filed Weights   02/12/15 0900 02/12/15 1127  Weight: 95.482 kg (210 lb 8 oz) 90.175 kg (198 lb 12.8 oz)    Exam:   General:  NAD  Cardiovascular: RRR  Respiratory: CTAB anterior lung fields  Abdomen: Distended, NT, +BS,  Musculoskeletal: No c/c. 2-3+ bilateral lower extremity edema.   Data Reviewed: Basic Metabolic Panel:  Recent Labs Lab 02/12/15 0907 02/12/15 0926 02/13/15 0556 02/14/15 0636  NA 136 139 132* 134*  K 4.1 4.1 4.2 4.3  CL 102 101 101 105  CO2 26  --  25 24  GLUCOSE 125* 123* 102* 163*  BUN <5* 4* 6 6  CREATININE 0.81 0.70 0.76 0.90  CALCIUM 8.8*  --  8.3* 8.2*  MG  --   --   --  1.7   Liver Function Tests:  Recent Labs Lab 02/12/15 0907 02/14/15 0636  AST 37 34  ALT 20 17  ALKPHOS 106 107  BILITOT 1.4* 0.9  PROT 7.2 6.0*  ALBUMIN 2.2* 2.0*    Recent Labs Lab 02/12/15 0907  AMMONIA 57*   CBC:  Recent Labs Lab 02/12/15 0907 02/12/15 0926 02/14/15 0636  WBC 5.7  --  7.3  NEUTROABS 2.4  --   --   HGB 13.9 17.3* 12.4*  HCT 43.0 51.0 37.8*  MCV 70.0*  --  68.6*  PLT 271  --  237   Cardiac Enzymes:  Recent Labs Lab 02/12/15 0907  TROPONINI <0.03   BNP (last 3 results)  Recent Labs  09/19/14 1920 02/12/15 0907  BNP 29.8 19.0    CBG:  Recent Labs Lab 02/13/15 1154 02/13/15 1702 02/13/15 2143 02/14/15 0800 02/14/15 1121  GLUCAP  164* 134* 172* 151* 177*    Recent Results (from the past 240 hour(s))  Culture, body fluid-bottle     Status: None (Preliminary result)   Collection Time: 02/12/15 10:57 AM  Result Value Ref Range Status   Specimen Description FLUID ABDOMEN PERITONEAL  Final   Special Requests BAA  Final   Culture NO GROWTH 1 DAY  Final   Report Status PENDING  Incomplete  Gram stain     Status: None   Collection Time: 02/12/15 10:57 AM  Result Value Ref Range Status   Specimen Description FLUID ABDOMEN PERITONEAL  Final   Special Requests NONE  Final   Gram Stain   Final     MODERATE WBC PRESENT, PREDOMINANTLY MONONUCLEAR NO ORGANISMS SEEN    Report Status 02/12/2015 FINAL  Final     Studies: No results found.  Scheduled Meds: . azithromycin  250 mg Oral Daily  . benzonatate  200 mg Oral TID  . enoxaparin (LOVENOX) injection  40 mg Subcutaneous Daily  . folic acid  1 mg Oral Daily  . furosemide  80 mg Intravenous Q12H  . gabapentin  600 mg Oral TID  . insulin aspart  0-15 Units Subcutaneous TID WC  . insulin aspart  0-5 Units Subcutaneous QHS  . insulin detemir  16 Units Subcutaneous QHS  . LORazepam  0-4 mg Intravenous Q6H   Followed by  . [START ON 02/16/2015] LORazepam  0-4 mg Intravenous Q12H  . LORazepam  1 mg Intravenous Once  . magnesium oxide  400 mg Oral Daily  . multivitamin with minerals  1 tablet Oral Daily  . nortriptyline  50 mg Oral QHS  . pantoprazole  40 mg Oral BID  . polyethylene glycol  17 g Oral Daily  . propranolol  10 mg Oral TID  . rifaximin  550 mg Oral BID  . sodium chloride  3 mL Intravenous Q12H  . sodium chloride  3 mL Intravenous Q12H  . spironolactone  100 mg Oral BID  . thiamine  100 mg Oral Daily   Or  . thiamine  100 mg Intravenous Daily   Continuous Infusions:   Principal Problem:   Decompensated HCV cirrhosis Active Problems:   Cirrhosis, alcoholic/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   Hepatitis C   Ascites   Essential hypertension, benign   Uncontrolled type 2 diabetes mellitus   Dyspnea   Alcohol dependence   Tobacco abuse   Lower extremity edema   Acute bronchitis    Time spent: 35 mins    Aren Pryde MD [facility] Pager [phone]   If 7PM-7AM, please contact night-coverage at [email or web address], password [facility] 02/14/2015, 12:20 PM  LOS: 2 days

## 2015-02-15 ENCOUNTER — Inpatient Hospital Stay (HOSPITAL_COMMUNITY): Payer: Non-veteran care

## 2015-02-15 DIAGNOSIS — K7469 Other cirrhosis of liver: Secondary | ICD-10-CM

## 2015-02-15 DIAGNOSIS — K7460 Unspecified cirrhosis of liver: Secondary | ICD-10-CM

## 2015-02-15 DIAGNOSIS — B182 Chronic viral hepatitis C: Secondary | ICD-10-CM

## 2015-02-15 DIAGNOSIS — R600 Localized edema: Secondary | ICD-10-CM

## 2015-02-15 DIAGNOSIS — E1165 Type 2 diabetes mellitus with hyperglycemia: Secondary | ICD-10-CM

## 2015-02-15 DIAGNOSIS — K7682 Hepatic encephalopathy: Secondary | ICD-10-CM

## 2015-02-15 DIAGNOSIS — K7290 Hepatic failure, unspecified without coma: Secondary | ICD-10-CM

## 2015-02-15 LAB — CBC
HEMATOCRIT: 41.6 % (ref 39.0–52.0)
HEMOGLOBIN: 13.6 g/dL (ref 13.0–17.0)
MCH: 22 pg — ABNORMAL LOW (ref 26.0–34.0)
MCHC: 32.7 g/dL (ref 30.0–36.0)
MCV: 67.2 fL — ABNORMAL LOW (ref 78.0–100.0)
Platelets: 276 10*3/uL (ref 150–400)
RBC: 6.19 MIL/uL — AB (ref 4.22–5.81)
RDW: 17.5 % — ABNORMAL HIGH (ref 11.5–15.5)
WBC: 6.9 10*3/uL (ref 4.0–10.5)

## 2015-02-15 LAB — BASIC METABOLIC PANEL
ANION GAP: 6 (ref 5–15)
BUN: 6 mg/dL (ref 6–20)
CHLORIDE: 98 mmol/L — AB (ref 101–111)
CO2: 29 mmol/L (ref 22–32)
Calcium: 8.5 mg/dL — ABNORMAL LOW (ref 8.9–10.3)
Creatinine, Ser: 0.9 mg/dL (ref 0.61–1.24)
GFR calc Af Amer: >60 mL/min (ref >60)
GFR calc non Af Amer: >60 mL/min (ref >60)
GLUCOSE: 141 mg/dL — AB (ref 65–99)
Potassium: 3.6 mmol/L (ref 3.5–5.1)
SODIUM: 133 mmol/L — AB (ref 135–145)

## 2015-02-15 LAB — GLUCOSE, CAPILLARY
GLUCOSE-CAPILLARY: 199 mg/dL — AB (ref 65–99)
GLUCOSE-CAPILLARY: 146 mg/dL — AB (ref 65–99)
Glucose-Capillary: 118 mg/dL — ABNORMAL HIGH (ref 65–99)
Glucose-Capillary: 126 mg/dL — ABNORMAL HIGH (ref 65–99)
Glucose-Capillary: 165 mg/dL — ABNORMAL HIGH (ref 65–99)

## 2015-02-15 LAB — PROTEIN / CREATININE RATIO, URINE
Creatinine, Urine: 45.99 mg/dL
Protein Creatinine Ratio: 0.13 mg/mg{Cre} (ref 0.00–0.15)
Total Protein, Urine: 6 mg/dL

## 2015-02-15 LAB — AMMONIA: AMMONIA: 103 umol/L — AB (ref 9–35)

## 2015-02-15 MED ORDER — LACTULOSE 10 GM/15ML PO SOLN
20.0000 g | Freq: Three times a day (TID) | ORAL | Status: DC
  Administered 2015-02-15 – 2015-02-17 (×6): 20 g via ORAL
  Filled 2015-02-15 (×6): qty 30

## 2015-02-15 MED ORDER — LEVOFLOXACIN IN D5W 750 MG/150ML IV SOLN
750.0000 mg | INTRAVENOUS | Status: DC
  Administered 2015-02-15 – 2015-02-16 (×2): 750 mg via INTRAVENOUS
  Filled 2015-02-15 (×3): qty 150

## 2015-02-15 MED ORDER — METRONIDAZOLE IN NACL 5-0.79 MG/ML-% IV SOLN
500.0000 mg | Freq: Three times a day (TID) | INTRAVENOUS | Status: DC
  Administered 2015-02-15 – 2015-02-16 (×3): 500 mg via INTRAVENOUS
  Filled 2015-02-15 (×3): qty 100

## 2015-02-15 MED ORDER — LIDOCAINE HCL (PF) 1 % IJ SOLN
INTRAMUSCULAR | Status: AC
  Filled 2015-02-15: qty 10

## 2015-02-15 MED ORDER — ALBUMIN HUMAN 25 % IV SOLN
50.0000 g | Freq: Once | INTRAVENOUS | Status: AC
  Administered 2015-02-15: 50 g via INTRAVENOUS
  Filled 2015-02-15: qty 200

## 2015-02-15 NOTE — Progress Notes (Signed)
Utilization Review completed. Kristion Holifield RN BSN CM 

## 2015-02-15 NOTE — Procedures (Signed)
US guided therapeutic paracentesis performed yielding 6.4 liters hazy, yellow fluid. No immediate complications.

## 2015-02-15 NOTE — Progress Notes (Addendum)
Family upset that MD has not come see patient. Family told that MD has multiple patients to see and that if they have to leave Dr. Arbutus Leas can call with updates. Earlier family stated to RN that they would wait with patient until MD comes see. Attempt to reassure family, and Dr. Arbutus Leas notified of their wishes. Family did not wait and left bedside. Patient remains comfortable and calm.

## 2015-02-15 NOTE — Care Management Note (Signed)
Case Management Note  Patient Details  Name: Morgan Rennert MRN: [id number] Date of Birth: Oct 10, 1955  Subjective/Objective:                 Patient from Home; self care; lives alone. Admitted with Cirrhosis and receiving multiple paracentesis.   Receives care at [facility] Next Appointment is 02-19-15 at 11:00 with Dr Tana Felts Infectious Disease. [address] on the 1st floor. Patient's SW is Building surveyor at [phone], ext. [phone]. Information received from Joey, Patient coordinator at the [facility] [phone]   Action/Plan:  Will continue to follow and coordinate [facility] care [facility] as disposition plan is made.  Expected Discharge Date:                  Expected Discharge Plan:  Home w Home Health Services  In-House Referral:     Discharge planning Services  CM Consult  Post Acute Care Choice:    Choice offered to:     DME Arranged:    DME Agency:     HH Arranged:    HH Agency:     Status of Service:  In process, will continue to follow  Medicare Important Message Given:    Date Medicare IM Given:    Medicare IM give by:    Date Additional Medicare IM Given:    Additional Medicare Important Message give by:     If discussed at Long Length of Stay Meetings, dates discussed:    Additional Comments:  Lawerance Sabal, RN 02/15/2015, 11:19 AM

## 2015-02-15 NOTE — Progress Notes (Signed)
PROGRESS NOTE  Curtis Estrada WUJ:[id number] DOB: 02/22/1956 DOA: 02/12/2015 PCP: [facility]  Brief History  59 year old male with a history of cirrhosis secondary to alcohol and chronic hepatitis C, diabetes mellitus, hypertension, and coagulopathy presents with worsening leg edema, shortness of breath and abdominal distention. The patient denies any fevers or chills, but states that he has been coughing for the better part of one month without any hemoptysis. He had one episode of nausea and emesis on the day prior to admission without any blood. There was no coffee grounds in his emesis.Unfortunately, the patient continues to drink 3 x 40 oz beers per day. He has been drinking for at least the last 40 years. He continues to smoke  Assessment/Plan: Decompensated liver cirrhosis with large volume ascites -Patient has had paracentesis on 02/12/2015 and 02/15/2015 removing a total of 10.9 L -cell count--suggestive of SBP;  cultures will likely be negative as the patient has been on antibiotics -Treat empirically for SBP given low-grade fever and abdominal pain -change back to po diuretics and monitor renal function and electrolytes  -continue propranolol--decreased dose to twice a day given soft blood pressure -Check coags -HIV antibody--negative Dyspnea  -The major issue may be the patient's ascites pushing on his diaphragm  -Presently 100% on room air without any distress -Echocardiogram-09/20/2014 showed EF 55-60%, grade 1 diastolic dysfunction Hepatic encephalopathy -no BM in 2 days -check ammonia -give lactulose -continue rifaximin alcohol dependence -Alcohol withdrawal protocol -Cessation discussed Tobacco abuse -Suspect the patient has underlying COPD -Duo nebs when necessary shortness of breath and coughing -Presently stable on room air- Diabetes mellitus type 2 -Hemoglobin A1c 9.3 on 09/17/14 -Continue Levemir  -NovoLog sliding scale  Lower extremity  edema  -Diuretics as discussed  -urine protein/creatinine ratio  Family Communication: sisters mamie and joyce updated 02/15/15 Disposition Plan:   Home when medically stable       Procedures/Studies: Dg Chest 2 View  02/12/2015   CLINICAL DATA:  Pain in the right ribs after fall yesterday. Initial encounter.  EXAM: CHEST  2 VIEW  COMPARISON:  09/19/2014  FINDINGS: Low lung volumes with bilateral linear opacities, increased from previous. There is no edema, consolidation, effusion, or pneumothorax.  No indication of displaced rib fracture.  Normal heart size and stable mild aortic tortuosity.  IMPRESSION: 1. No traumatic findings. 2. Low lung volumes with bilateral atelectasis.   Electronically Signed   By: Marnee Spring M.D.   On: 02/12/2015 09:57   US Paracentesis  02/15/2015   INDICATION: Hepatitis-C, cirrhosis, recurrent ascites. Request is made for therapeutic paracentesis.  EXAM: ULTRASOUND-GUIDED THERAPEUTIC  PARACENTESIS  COMPARISON:  Prior paracentesis on 02/12/2015  MEDICATIONS: None.  COMPLICATIONS: None immediate  TECHNIQUE: Informed written consent was obtained from the patient after a discussion of the risks, benefits and alternatives to treatment. A timeout was performed prior to the initiation of the procedure.  Initial ultrasound scanning demonstrates a large amount of ascites within the left lower abdominal quadrant. The left lower abdomen was prepped and draped in the usual sterile fashion. 1% lidocaine was used for local anesthesia. Under direct ultrasound guidance, a 19 gauge, 7-cm, Yueh catheter was introduced. An ultrasound image was saved for documentation purposed. The paracentesis was performed. The catheter was removed and a dressing was applied. The patient tolerated the procedure well without immediate post procedural complication.  FINDINGS: A total of approximately 6.4 liters of hazy, yellow fluid was removed.  IMPRESSION: Successful ultrasound-guided  therapeutic  paracentesis yielding 6.4 liters of peritoneal fluid.  Read by: Jeananne Rama, PA-C   Electronically Signed   By: Irish Lack M.D.   On: 02/15/2015 12:28   US Paracentesis  02/12/2015   INDICATION: Ascites, request for paracentesis.  EXAM: ULTRASOUND-GUIDED PARACENTESIS  COMPARISON:  Paracentesis 09/20/14.  MEDICATIONS: None.  COMPLICATIONS: None immediate  TECHNIQUE: Informed written consent was obtained from the patient after a discussion of the risks, benefits and alternatives to treatment. A timeout was performed prior to the initiation of the procedure.  Initial ultrasound scanning demonstrates a large amount of ascites within the left upper abdominal quadrant. The left upper abdomen was prepped and draped in the usual sterile fashion. 1% lidocaine was used for local anesthesia.  Under direct ultrasound guidance, a 19 gauge, 7-cm, Yueh catheter was introduced. An ultrasound image was saved for documentation purposed. The paracentesis was performed. The catheter was removed and a dressing was applied. The patient tolerated the procedure well without immediate post procedural complication.  FINDINGS: A total of approximately 4.5 liters of serous fluid was removed. Samples were sent to the laboratory as requested by the clinical team.  IMPRESSION: Successful ultrasound-guided paracentesis yielding 4.5 liters of peritoneal fluid.  Read By:  Pattricia Boss PA-C   Electronically Signed   By: Irish Lack M.D.   On: 02/12/2015 12:10         Subjective:   Objective: Filed Vitals:   02/15/15 1129 02/15/15 1137 02/15/15 1216 02/15/15 1338  BP: 93/66 93/63 92/71  100/68  Pulse:   92 92  Temp:   98.3 F (36.8 C) 97.7 F (36.5 C)  TempSrc:   Oral Oral  Resp:   24 16  Height:      Weight:      SpO2:   95% 99%    Intake/Output Summary (Last 24 hours) at 02/15/15 1416 Last data filed at 02/15/15 1219  Gross per 24 hour  Intake    720 ml  Output    650 ml  Net     70 ml   Weight change:    Exam:   General:  Pt is alert, follows commands appropriately, not in acute distress  HEENT: No icterus, No thrush, No neck mass, [place]/AT  Cardiovascular: RRR, S1/S2, no rubs, no gallops  Respiratory: CTA bilaterally, no wheezing, no crackles, no rhonchi  Abdomen: Soft/+BS, non tender, non distended, no guarding  Extremities: No edema, No lymphangitis, No petechiae, No rashes, no synovitis  Data Reviewed: Basic Metabolic Panel:  Recent Labs Lab 02/12/15 0907 02/12/15 0926 02/13/15 0556 02/14/15 0636 02/15/15 0550  NA 136 139 132* 134* 133*  K 4.1 4.1 4.2 4.3 3.6  CL 102 101 101 105 98*  CO2 26  --  25 24 29   GLUCOSE 125* 123* 102* 163* 141*  BUN <5* 4* 6 6 6   CREATININE 0.81 0.70 0.76 0.90 0.90  CALCIUM 8.8*  --  8.3* 8.2* 8.5*  MG  --   --   --  1.7  --    Liver Function Tests:  Recent Labs Lab 02/12/15 0907 02/14/15 0636  AST 37 34  ALT 20 17  ALKPHOS 106 107  BILITOT 1.4* 0.9  PROT 7.2 6.0*  ALBUMIN 2.2* 2.0*   No results for input(s): LIPASE, AMYLASE in the last 168 hours.  Recent Labs Lab 02/12/15 0907  AMMONIA 57*   CBC:  Recent Labs Lab 02/12/15 0907 02/12/15 0926 02/14/15 0636 02/15/15 0550  WBC 5.7  --  7.3 6.9  NEUTROABS 2.4  --   --   --   HGB 13.9 17.3* 12.4* 13.6  HCT 43.0 51.0 37.8* 41.6  MCV 70.0*  --  68.6* 67.2*  PLT 271  --  237 276   Cardiac Enzymes:  Recent Labs Lab 02/12/15 0907  TROPONINI <0.03   BNP: Invalid input(s): POCBNP CBG:  Recent Labs Lab 02/14/15 1647 02/14/15 2255 02/15/15 0239 02/15/15 0751 02/15/15 1216  GLUCAP 126* 145* 118* 126* 199*    Recent Results (from the past 240 hour(s))  Culture, body fluid-bottle     Status: None (Preliminary result)   Collection Time: 02/12/15 10:57 AM  Result Value Ref Range Status   Specimen Description FLUID ABDOMEN PERITONEAL  Final   Special Requests BAA  Final   Culture NO GROWTH 2 DAYS  Final   Report Status PENDING  Incomplete  Gram stain      Status: None   Collection Time: 02/12/15 10:57 AM  Result Value Ref Range Status   Specimen Description FLUID ABDOMEN PERITONEAL  Final   Special Requests NONE  Final   Gram Stain   Final    MODERATE WBC PRESENT, PREDOMINANTLY MONONUCLEAR NO ORGANISMS SEEN    Report Status 02/12/2015 FINAL  Final     Scheduled Meds: . benzonatate  200 mg Oral TID  . enoxaparin (LOVENOX) injection  40 mg Subcutaneous Daily  . folic acid  1 mg Oral Daily  . furosemide  80 mg Intravenous Q12H  . gabapentin  600 mg Oral TID  . insulin aspart  0-15 Units Subcutaneous TID WC  . insulin aspart  0-5 Units Subcutaneous QHS  . insulin detemir  16 Units Subcutaneous QHS  . levofloxacin (LEVAQUIN) IV  750 mg Intravenous Q24H  . LORazepam  0-4 mg Intravenous Q6H   Followed by  . [START ON 02/16/2015] LORazepam  0-4 mg Intravenous Q12H  . LORazepam  1 mg Intravenous Once  . magnesium oxide  400 mg Oral Daily  . metronidazole  500 mg Intravenous Q8H  . multivitamin with minerals  1 tablet Oral Daily  . nortriptyline  50 mg Oral QHS  . pantoprazole  40 mg Oral BID  . polyethylene glycol  17 g Oral Daily  . propranolol  10 mg Oral TID  . rifaximin  550 mg Oral BID  . sodium chloride  3 mL Intravenous Q12H  . sodium chloride  3 mL Intravenous Q12H  . spironolactone  100 mg Oral BID  . thiamine  100 mg Oral Daily   Continuous Infusions:    Curtis Laursen, DO  [facility] Pager [phone]  If 7PM-7AM, please contact night-coverage [email or web address] Password [email or web address] 02/15/2015, 2:16 PM   LOS: 3 days

## 2015-02-15 NOTE — Progress Notes (Signed)
Dr. Arbutus Leas made aware about BP 92/71 and HR 92. Order to hold lasix. MD also made aware that patient's family would like to see them before they leave for work. MD stated he will get in there to provided updates at soon as possible.

## 2015-02-16 DIAGNOSIS — K7031 Alcoholic cirrhosis of liver with ascites: Principal | ICD-10-CM

## 2015-02-16 DIAGNOSIS — F10288 Alcohol dependence with other alcohol-induced disorder: Secondary | ICD-10-CM

## 2015-02-16 LAB — GLUCOSE, CAPILLARY
GLUCOSE-CAPILLARY: 193 mg/dL — AB (ref 65–99)
GLUCOSE-CAPILLARY: 140 mg/dL — AB (ref 65–99)
Glucose-Capillary: 187 mg/dL — ABNORMAL HIGH (ref 65–99)
Glucose-Capillary: 182 mg/dL — ABNORMAL HIGH (ref 65–99)

## 2015-02-16 LAB — BASIC METABOLIC PANEL
Anion gap: 8 (ref 5–15)
BUN: 6 mg/dL (ref 6–20)
CALCIUM: 8.4 mg/dL — AB (ref 8.9–10.3)
CO2: 26 mmol/L (ref 22–32)
Chloride: 100 mmol/L — ABNORMAL LOW (ref 101–111)
Creatinine, Ser: 0.88 mg/dL (ref 0.61–1.24)
GFR calc Af Amer: >60 mL/min (ref >60)
GLUCOSE: 168 mg/dL — AB (ref 65–99)
Potassium: 4.3 mmol/L (ref 3.5–5.1)
Sodium: 134 mmol/L — ABNORMAL LOW (ref 135–145)

## 2015-02-16 MED ORDER — METRONIDAZOLE 500 MG PO TABS
500.0000 mg | ORAL_TABLET | Freq: Three times a day (TID) | ORAL | Status: DC
  Administered 2015-02-16 – 2015-02-19 (×9): 500 mg via ORAL
  Filled 2015-02-16 (×9): qty 1

## 2015-02-16 MED ORDER — INSULIN ASPART 100 UNIT/ML ~~LOC~~ SOLN
3.0000 [IU] | Freq: Three times a day (TID) | SUBCUTANEOUS | Status: DC
  Administered 2015-02-17 (×3): 3 [IU] via SUBCUTANEOUS

## 2015-02-16 MED ORDER — FUROSEMIDE 80 MG PO TABS
80.0000 mg | ORAL_TABLET | Freq: Two times a day (BID) | ORAL | Status: DC
  Administered 2015-02-17 – 2015-02-19 (×5): 80 mg via ORAL
  Filled 2015-02-16 (×5): qty 1

## 2015-02-16 NOTE — Progress Notes (Signed)
PROGRESS NOTE  Curtis Estrada ZOX:[id number] DOB: [birth year] DOA: 02/12/2015 PCP: [facility]  Brief History  59 year old male with a history of cirrhosis secondary to alcohol and chronic hepatitis C, diabetes mellitus, hypertension, and coagulopathy presents with worsening leg edema, shortness of breath and abdominal distention. The patient denies any fevers or chills, but states that he has been coughing for the better part of one month without any hemoptysis. He had one episode of nausea and emesis on the day prior to admission without any blood. There was no coffee grounds in his emesis.Unfortunately, the patient continues to drink 3 x 40 oz beers per day. He has been drinking for at least the last 40 years. He continues to smoke  Assessment/Plan: Decompensated liver cirrhosis with large volume ascites -Patient has had paracentesis on 02/12/2015 and 02/15/2015 removing a total of 10.9 L -cell count--suggestive of SBP; cultures will likely be negative as the patient has been on antibiotics -Treat empirically for SBP given low-grade fever and abdominal pain -change back to po diuretics and monitor renal function and electrolytes  -continue propranolol -Check coags-->INR 1.42 -HIV antibody--negative -Change furosemide 80 mg twice a day, continue spironolactone Dyspnea  -The major issue may be the patient's ascites pushing on his diaphragm  -Presently 100% on room air without any distress -Echocardiogram-09/20/2014 showed EF 55-60%, grade 1 diastolic dysfunction Hepatic encephalopathy -no BM in 2 days -ammonia--103 -give lactulose-->a little more lucid -recheck ammonia in am -continue rifaximin -d/c nortriptyline for now alcohol dependence -Alcohol withdrawal protocol -Cessation discussed Tobacco abuse -Suspect the patient has underlying COPD -Duo nebs when necessary shortness of breath and coughing -Presently stable on room air- Diabetes mellitus type  2 -Hemoglobin A1c 9.3 on 09/17/14 -Continue Levemir  -NovoLog sliding scale  -add Novolog 3 units with meals Lower extremity edema  -due to liver disease -Diuretics as discussed  -urine protein/creatinine ratio-->0.13  Family Communication: sisters mamie and joyce updated 02/15/15 Disposition Plan: Home 1-2 days if stable     Procedures/Studies: Dg Chest 2 View  02/12/2015   CLINICAL DATA:  Pain in the right ribs after fall yesterday. Initial encounter.  EXAM: CHEST  2 VIEW  COMPARISON:  09/19/2014  FINDINGS: Low lung volumes with bilateral linear opacities, increased from previous. There is no edema, consolidation, effusion, or pneumothorax.  No indication of displaced rib fracture.  Normal heart size and stable mild aortic tortuosity.  IMPRESSION: 1. No traumatic findings. 2. Low lung volumes with bilateral atelectasis.   Electronically Signed   By: Marnee Spring M.D.   On: 02/12/2015 09:57   US Paracentesis  02/15/2015   INDICATION: Hepatitis-C, cirrhosis, recurrent ascites. Request is made for therapeutic paracentesis.  EXAM: ULTRASOUND-GUIDED THERAPEUTIC  PARACENTESIS  COMPARISON:  Prior paracentesis on 02/12/2015  MEDICATIONS: None.  COMPLICATIONS: None immediate  TECHNIQUE: Informed written consent was obtained from the patient after a discussion of the risks, benefits and alternatives to treatment. A timeout was performed prior to the initiation of the procedure.  Initial ultrasound scanning demonstrates a large amount of ascites within the left lower abdominal quadrant. The left lower abdomen was prepped and draped in the usual sterile fashion. 1% lidocaine was used for local anesthesia. Under direct ultrasound guidance, a 19 gauge, 7-cm, Yueh catheter was introduced. An ultrasound image was saved for documentation purposed. The paracentesis was performed. The catheter was removed and a dressing was applied. The patient tolerated the procedure well without immediate post procedural  complication.  FINDINGS: A total of approximately 6.4 liters of hazy, yellow fluid was removed.  IMPRESSION: Successful ultrasound-guided therapeutic paracentesis yielding 6.4 liters of peritoneal fluid.  Read by: Jeananne Rama, PA-C   Electronically Signed   By: Irish Lack M.D.   On: 02/15/2015 12:28   US Paracentesis  02/12/2015   INDICATION: Ascites, request for paracentesis.  EXAM: ULTRASOUND-GUIDED PARACENTESIS  COMPARISON:  Paracentesis 09/20/14.  MEDICATIONS: None.  COMPLICATIONS: None immediate  TECHNIQUE: Informed written consent was obtained from the patient after a discussion of the risks, benefits and alternatives to treatment. A timeout was performed prior to the initiation of the procedure.  Initial ultrasound scanning demonstrates a large amount of ascites within the left upper abdominal quadrant. The left upper abdomen was prepped and draped in the usual sterile fashion. 1% lidocaine was used for local anesthesia.  Under direct ultrasound guidance, a 19 gauge, 7-cm, Yueh catheter was introduced. An ultrasound image was saved for documentation purposed. The paracentesis was performed. The catheter was removed and a dressing was applied. The patient tolerated the procedure well without immediate post procedural complication.  FINDINGS: A total of approximately 4.5 liters of serous fluid was removed. Samples were sent to the laboratory as requested by the clinical team.  IMPRESSION: Successful ultrasound-guided paracentesis yielding 4.5 liters of peritoneal fluid.  Read By:  Pattricia Boss PA-C   Electronically Signed   By: Irish Lack M.D.   On: 02/12/2015 12:10        Subjective: Patient is more lucid today, but still a little sleepy. Denies any headache, chest pain, shortness breath, nausea, vomiting, diarrhea. Abdominal pain is improving. Only had one loose stool today. Denies any hematochezia or melena, dysuria or hematuria.  Objective: Filed Vitals:   02/15/15 1614 02/16/15 0007  02/16/15 0538 02/16/15 1301  BP: 95/61 122/86 99/67 101/69  Pulse: 92 97 99 101  Temp:  99 F (37.2 C) 98.3 F (36.8 C) 98.3 F (36.8 C)  TempSrc:  Oral Oral Oral  Resp:  Height:      Weight:   81.3 kg (179 lb 3.7 oz)   SpO2:  99% 95% 100%    Intake/Output Summary (Last 24 hours) at 02/16/15 1734 Last data filed at 02/16/15 1633  Gross per 24 hour  Intake   1060 ml  Output   2600 ml  Net  -1540 ml   Weight change: -8.874 kg (-19 lb 9 oz) Exam:   General:  Pt is alert, follows commands appropriately, not in acute distress  HEENT: No icterus, No thrush, No neck mass, [place]/AT; no meningismus  Cardiovascular: RRR, S1/S2, no rubs, no gallops  Respiratory: Bibasilar crackles. No wheezing  Abdomen: Soft/+BS, non tender, non distended, no guarding; no hepatosplenomegaly  Extremities: 2+ LEedema, No lymphangitis, No petechiae, No rashes, no synovitis; no cyanosis or clubbing  Data Reviewed: Basic Metabolic Panel:  Recent Labs Lab 02/12/15 0907 02/12/15 0926 02/13/15 0556 02/14/15 0636 02/15/15 0550 02/16/15 0551  NA 136 139 132* 134* 133* 134*  K 4.1 4.1 4.2 4.3 3.6 4.3  CL 102 101 101 105 98* 100*  CO2 26  --  GLUCOSE 125* 123* 102* 163* 141* 168*  BUN <5* 4* CREATININE 0.81 0.70 0.76 0.90 0.90 0.88  CALCIUM 8.8*  --  8.3* 8.2* 8.5* 8.4*  MG  --   --   --  1.7  --   --    Liver Function  Tests:  Recent Labs Lab 02/12/15 0907 02/14/15 0636  AST 37 34  ALT 20 17  ALKPHOS 106 107  BILITOT 1.4* 0.9  PROT 7.2 6.0*  ALBUMIN 2.2* 2.0*   No results for input(s): LIPASE, AMYLASE in the last 168 hours.  Recent Labs Lab 02/12/15 0907 02/15/15 1500  AMMONIA 57* 103*   CBC:  Recent Labs Lab 02/12/15 0907 02/12/15 0926 02/14/15 0636 02/15/15 0550  WBC 5.7  --  7.3 6.9  NEUTROABS 2.4  --   --   --   HGB 13.9 17.3* 12.4* 13.6  HCT 43.0 51.0 37.8* 41.6  MCV 70.0*  --  68.6* 67.2*  PLT 271  --  237 276   Cardiac  Enzymes:  Recent Labs Lab 02/12/15 0907  TROPONINI <0.03   BNP: Invalid input(s): POCBNP CBG:  Recent Labs Lab 02/15/15 1657 02/15/15 2119 02/16/15 0758 02/16/15 1208 02/16/15 1647  GLUCAP 146* 165* 187* 193* 182*    Recent Results (from the past 240 hour(s))  Culture, body fluid-bottle     Status: None (Preliminary result)   Collection Time: 02/12/15 10:57 AM  Result Value Ref Range Status   Specimen Description FLUID ABDOMEN PERITONEAL  Final   Special Requests BAA  Final   Culture NO GROWTH 4 DAYS  Final   Report Status PENDING  Incomplete  Gram stain     Status: None   Collection Time: 02/12/15 10:57 AM  Result Value Ref Range Status   Specimen Description FLUID ABDOMEN PERITONEAL  Final   Special Requests NONE  Final   Gram Stain   Final    MODERATE WBC PRESENT, PREDOMINANTLY MONONUCLEAR NO ORGANISMS SEEN    Report Status 02/12/2015 FINAL  Final     Scheduled Meds: . benzonatate  200 mg Oral TID  . enoxaparin (LOVENOX) injection  40 mg Subcutaneous Daily  . folic acid  1 mg Oral Daily  . [START ON 02/17/2015] furosemide  80 mg Oral BID  . gabapentin  600 mg Oral TID  . insulin aspart  0-15 Units Subcutaneous TID WC  . insulin aspart  0-5 Units Subcutaneous QHS  . [START ON 02/17/2015] insulin aspart  3 Units Subcutaneous TID WC  . insulin detemir  16 Units Subcutaneous QHS  . lactulose  20 g Oral TID  . levofloxacin (LEVAQUIN) IV  750 mg Intravenous Q24H  . LORazepam  0-4 mg Intravenous Q12H  . LORazepam  1 mg Intravenous Once  . magnesium oxide  400 mg Oral Daily  . metroNIDAZOLE  500 mg Oral 3 times per day  . multivitamin with minerals  1 tablet Oral Daily  . pantoprazole  40 mg Oral BID  . polyethylene glycol  17 g Oral Daily  . propranolol  10 mg Oral TID  . rifaximin  550 mg Oral BID  . sodium chloride  3 mL Intravenous Q12H  . sodium chloride  3 mL Intravenous Q12H  . spironolactone  100 mg Oral BID  . thiamine  100 mg Oral Daily    Continuous Infusions:    Trigger Frasier, DO  [facility] Pager [phone]  If 7PM-7AM, please contact night-coverage [email or web address] Password [email or web address] 02/16/2015, 5:34 PM   LOS: 4 days

## 2015-02-17 DIAGNOSIS — F1029 Alcohol dependence with unspecified alcohol-induced disorder: Secondary | ICD-10-CM

## 2015-02-17 DIAGNOSIS — D689 Coagulation defect, unspecified: Secondary | ICD-10-CM

## 2015-02-17 LAB — BASIC METABOLIC PANEL
ANION GAP: 7 (ref 5–15)
BUN: 6 mg/dL (ref 6–20)
CO2: 26 mmol/L (ref 22–32)
CREATININE: 0.93 mg/dL (ref 0.61–1.24)
Calcium: 8.6 mg/dL — ABNORMAL LOW (ref 8.9–10.3)
Chloride: 100 mmol/L — ABNORMAL LOW (ref 101–111)
Glucose, Bld: 151 mg/dL — ABNORMAL HIGH (ref 65–99)
Potassium: 4.5 mmol/L (ref 3.5–5.1)
SODIUM: 133 mmol/L — AB (ref 135–145)

## 2015-02-17 LAB — AMMONIA: AMMONIA: 96 umol/L — AB (ref 9–35)

## 2015-02-17 LAB — GLUCOSE, CAPILLARY
GLUCOSE-CAPILLARY: 189 mg/dL — AB (ref 65–99)
GLUCOSE-CAPILLARY: 246 mg/dL — AB (ref 65–99)
GLUCOSE-CAPILLARY: 167 mg/dL — AB (ref 65–99)
Glucose-Capillary: 116 mg/dL — ABNORMAL HIGH (ref 65–99)

## 2015-02-17 LAB — CULTURE, BODY FLUID-BOTTLE: CULTURE: NO GROWTH

## 2015-02-17 LAB — HEMOGLOBIN A1C
Hgb A1c MFr Bld: 8.3 % — ABNORMAL HIGH (ref 4.8–5.6)
MEAN PLASMA GLUCOSE: 192 mg/dL

## 2015-02-17 MED ORDER — LEVOFLOXACIN 750 MG PO TABS
750.0000 mg | ORAL_TABLET | ORAL | Status: DC
  Administered 2015-02-17 – 2015-02-18 (×2): 750 mg via ORAL
  Filled 2015-02-17 (×2): qty 1

## 2015-02-17 MED ORDER — INSULIN ASPART 100 UNIT/ML ~~LOC~~ SOLN
4.0000 [IU] | Freq: Three times a day (TID) | SUBCUTANEOUS | Status: DC
  Administered 2015-02-18 – 2015-02-19 (×4): 4 [IU] via SUBCUTANEOUS

## 2015-02-17 MED ORDER — LACTULOSE 10 GM/15ML PO SOLN
30.0000 g | Freq: Four times a day (QID) | ORAL | Status: DC
  Administered 2015-02-17 – 2015-02-19 (×7): 30 g via ORAL
  Filled 2015-02-17 (×6): qty 45

## 2015-02-17 NOTE — Evaluation (Signed)
Physical Therapy Evaluation Patient Details Name: Curtis Estrada MRN: [id number] DOB: Apr 02, 1956 Today's Date: 02/17/2015   History of Present Illness  59 y.o. male admitted to [facility] on 02/12/15 for SOB and abdominal distension.  Pt dx with decompendated liver cirrhosis with large volume ascites, dyspnea, hepatic encephalopathy, and alcohol dependence.  Pt with significant PMHx of HTN, DM, hepatitis C, anemia, constipation, and chronic R great toe wound.  Clinical Impression  Pt is mildly unsteady on his feet when he first gets up which improves with increased gait distance. He is close to his baseline level of function. He does report he at times has to go down several flights of stairs when the elevator breaks and the alarm sounds in his apartment complex.  No DOE with gait and O2 sats and HR are stable.   PT to follow acutely for deficits listed below, but no f/u therapy services recommended.     Follow Up Recommendations No PT follow up    Equipment Recommendations  None recommended by PT    Recommendations for Other Services   NA    Precautions / Restrictions Precautions Precautions: Fall Precaution Comments: at baseline pt uses RW      Mobility  Bed Mobility Overal bed mobility: Modified Independent             General bed mobility comments: uses railing for leverage  Transfers Overall transfer level: Needs assistance Equipment used: Rolling walker (2 wheeled) Transfers: Sit to/from Stand Sit to Stand: Min guard         General transfer comment: min guard assist for safety as pt needs multiple attempts to get to standing and momentum.   Ambulation/Gait Ambulation/Gait assistance: Min guard;Supervision Ambulation Distance (Feet): 300 Feet Assistive device: Rolling walker (2 wheeled) Gait Pattern/deviations: Step-through pattern;Shuffle;Staggering left;Staggering right Gait velocity: decreased Gait velocity interpretation: Below normal speed for age/gender General  Gait Details: first 150' pt needed min guard assist to steady for balance, and as he indicated, as he continued to walk he became more steady.           Balance Overall balance assessment: Needs assistance Sitting-balance support: Feet supported;Bilateral upper extremity supported Sitting balance-Leahy Scale: Fair     Standing balance support: Bilateral upper extremity supported Standing balance-Leahy Scale: Fair                               Pertinent Vitals/Pain Pain Assessment: Faces Faces Pain Scale: Hurts a little bit Pain Location: abdomen Pain Descriptors / Indicators: Aching Pain Intervention(s): Limited activity within patient's tolerance;Monitored during session;Repositioned    Home Living Family/patient expects to be discharged to:: Private residence Living Arrangements: Alone Available Help at Discharge: Friend(s);Available PRN/intermittently Type of Home: Apartment Home Access: Elevator     Home Layout: One level Home Equipment: Walker - 2 wheels (two walkers per pt)      Prior Function Level of Independence: Independent with assistive device(s)         Comments: per pt he uses the RW when he first gets up and going in the AM and then he doesn't need it after he gets going.         Extremity/Trunk Assessment   Upper Extremity Assessment: Generalized weakness           Lower Extremity Assessment: Generalized weakness      Cervical / Trunk Assessment: Normal  Communication   Communication: No difficulties  Cognition Arousal/Alertness: Awake/alert Behavior During  Therapy: WFL for tasks assessed/performed Overall Cognitive Status: No family/caregiver present to determine baseline cognitive functioning (seems slow to process)                               Assessment/Plan    PT Assessment Patient needs continued PT services  PT Diagnosis Difficulty walking;Generalized weakness;Abnormality of gait;Acute pain   PT  Problem List Decreased strength;Decreased activity tolerance;Decreased balance;Decreased mobility;Decreased knowledge of use of DME;Decreased knowledge of precautions  PT Treatment Interventions DME instruction;Gait training;Stair training;Functional mobility training;Therapeutic activities;Therapeutic exercise;Balance training;Neuromuscular re-education;Cognitive remediation;Patient/family education   PT Goals (Current goals can be found in the Care Plan section) Acute Rehab PT Goals Patient Stated Goal: to go home PT Goal Formulation: With patient Time For Goal Achievement: 03/03/15 Potential to Achieve Goals: Good    Frequency Min 3X/week    End of Session   Activity Tolerance: Patient limited by fatigue Patient left: in chair;with call bell/phone within reach;with chair alarm set Nurse Communication: Mobility status         Time: 1610-9604 PT Time Calculation (min) (ACUTE ONLY): 25 min   Charges:   PT Evaluation $Initial PT Evaluation Tier I: 1 Procedure PT Treatments $Gait Training: 8-22 mins        Layza Summa B. Bertram Haddix, PT, DPT [phone]   02/17/2015, 3:36 PM

## 2015-02-17 NOTE — Progress Notes (Signed)
PROGRESS NOTE  Curtis Estrada WJX:[id number] DOB: 12/05/1955 DOA: 02/12/2015 PCP: [facility]  Brief History  59 year old male with a history of cirrhosis secondary to alcohol and chronic hepatitis C, diabetes mellitus, hypertension, and coagulopathy presents with worsening leg edema, shortness of breath and abdominal distention. The patient denies any fevers or chills, but states that he has been coughing for the better part of one month without any hemoptysis. He had one episode of nausea and emesis on the day prior to admission without any blood. There was no coffee grounds in his emesis.Unfortunately, the patient continues to drink 3 x 40 oz beers per day. He has been drinking for at least the last 40 years. He continues to smoke  Assessment/Plan: Decompensated liver cirrhosis with large volume ascites -Patient has had paracentesis on 02/12/2015 and 02/15/2015 removing a total of 10.9 L -cell count--suggestive of SBP; cultures will likely be negative as the patient has been on antibiotics -Treat empirically for SBP given low-grade fever and abdominal pain -change back to po diuretics and monitor renal function and electrolytes  -continue propranolol -Check coags-->INR 1.42 -HIV antibody--negative -Change furosemide 80 mg twice a day, continue spironolactone Dyspnea  -The major issue may be the patient's ascites pushing on his diaphragm  -Improved after paracentesis -Presently 100% on room air without any distress -Echocardiogram-09/20/2014 showed EF 55-60%, grade 1 diastolic dysfunction Hepatic encephalopathy -no BM in 2 days  -give lactulose-->a little more lucid -Only having one bowel movement--> increase lactulose 30 g every 6 hours -ammonia--103-->96 -continue rifaximin -d/c nortriptyline for now alcohol dependence -Alcohol withdrawal protocol -Cessation discussed Tobacco abuse -Suspect the patient has underlying COPD -Duo nebs when necessary shortness  of breath and coughing -Presently stable on room air Diabetes mellitus type 2 -Hemoglobin A1c 9.3 on 09/17/14 -Continue Levemir 16 units daily -NovoLog sliding scale  -Increase Novolog 4 units with meals Lower extremity edema  -due to liver disease -Diuretics as discussed  -urine protein/creatinine ratio-->0.13  Family Communication: sisters mamie and joyce updated 02/15/15 Disposition Plan: Home 1-2 days if stable  Procedures/Studies: Dg Chest 2 View  02/12/2015   CLINICAL DATA:  Pain in the right ribs after fall yesterday. Initial encounter.  EXAM: CHEST  2 VIEW  COMPARISON:  09/19/2014  FINDINGS: Low lung volumes with bilateral linear opacities, increased from previous. There is no edema, consolidation, effusion, or pneumothorax.  No indication of displaced rib fracture.  Normal heart size and stable mild aortic tortuosity.  IMPRESSION: 1. No traumatic findings. 2. Low lung volumes with bilateral atelectasis.   Electronically Signed   By: Marnee Spring M.D.   On: 02/12/2015 09:57   US Paracentesis  02/15/2015   INDICATION: Hepatitis-C, cirrhosis, recurrent ascites. Request is made for therapeutic paracentesis.  EXAM: ULTRASOUND-GUIDED THERAPEUTIC  PARACENTESIS  COMPARISON:  Prior paracentesis on 02/12/2015  MEDICATIONS: None.  COMPLICATIONS: None immediate  TECHNIQUE: Informed written consent was obtained from the patient after a discussion of the risks, benefits and alternatives to treatment. A timeout was performed prior to the initiation of the procedure.  Initial ultrasound scanning demonstrates a large amount of ascites within the left lower abdominal quadrant. The left lower abdomen was prepped and draped in the usual sterile fashion. 1% lidocaine was used for local anesthesia. Under direct ultrasound guidance, a 19 gauge, 7-cm, Yueh catheter was introduced. An ultrasound image was saved for documentation purposed. The paracentesis was performed. The catheter was removed and a dressing  was applied.  The patient tolerated the procedure well without immediate post procedural complication.  FINDINGS: A total of approximately 6.4 liters of hazy, yellow fluid was removed.  IMPRESSION: Successful ultrasound-guided therapeutic paracentesis yielding 6.4 liters of peritoneal fluid.  Read by: Jeananne Rama, PA-C   Electronically Signed   By: Irish Lack M.D.   On: 02/15/2015 12:28   US Paracentesis  02/12/2015   INDICATION: Ascites, request for paracentesis.  EXAM: ULTRASOUND-GUIDED PARACENTESIS  COMPARISON:  Paracentesis 09/20/14.  MEDICATIONS: None.  COMPLICATIONS: None immediate  TECHNIQUE: Informed written consent was obtained from the patient after a discussion of the risks, benefits and alternatives to treatment. A timeout was performed prior to the initiation of the procedure.  Initial ultrasound scanning demonstrates a large amount of ascites within the left upper abdominal quadrant. The left upper abdomen was prepped and draped in the usual sterile fashion. 1% lidocaine was used for local anesthesia.  Under direct ultrasound guidance, a 19 gauge, 7-cm, Yueh catheter was introduced. An ultrasound image was saved for documentation purposed. The paracentesis was performed. The catheter was removed and a dressing was applied. The patient tolerated the procedure well without immediate post procedural complication.  FINDINGS: A total of approximately 4.5 liters of serous fluid was removed. Samples were sent to the laboratory as requested by the clinical team.  IMPRESSION: Successful ultrasound-guided paracentesis yielding 4.5 liters of peritoneal fluid.  Read By:  Pattricia Boss PA-C   Electronically Signed   By: Irish Lack M.D.   On: 02/12/2015 12:10         Subjective: Patient is more alert today. He denies any headaches, chest pain, shortness breath, nausea, vomiting, diarrhea, abdominal pain. He did have some dyspnea on exertion. Abdominal pain has improved. Denies any hematochezia or  melena. No hematemesis.   Objective: Filed Vitals:   02/17/15 0525 02/17/15 1152 02/17/15 1527 02/17/15 1801  BP: 90/65 96/63  103/73  Pulse: 105 99 98 98  Temp: 98.9 F (37.2 C) 98.3 F (36.8 C)  98.4 F (36.9 C)  TempSrc: Oral Oral  Oral  Resp: Height:      Weight: 80.3 kg (177 lb 0.5 oz)     SpO2: 91% 93% 96% 95%    Intake/Output Summary (Last 24 hours) at 02/17/15 1813 Last data filed at 02/17/15 1530  Gross per 24 hour  Intake    203 ml  Output   1900 ml  Net  -1697 ml   Weight change: -1 kg (-2 lb 3.3 oz) Exam:   General:  Pt is alert, follows commands appropriately, not in acute distress  HEENT: No icterus, No thrush, No neck mass, [place]/AT  Cardiovascular: RRR, S1/S2, no rubs, no gallops  Respiratory: Bibasilar crackles. No wheezing. Good air movement.   Abdomen: Soft/+BS, non tender, mildly distended, no guarding  Extremities: 1+LE edema, No lymphangitis, No petechiae, No rashes, no synovitis  Data Reviewed: Basic Metabolic Panel:  Recent Labs Lab 02/13/15 0556 02/14/15 0636 02/15/15 0550 02/16/15 0551 02/17/15 0510  NA 132* 134* 133* 134* 133*  K 4.2 4.3 3.6 4.3 4.5  CL 101 105 98* 100* 100*  CO2 GLUCOSE 102* 163* 141* 168* 151*  BUN CREATININE 0.76 0.90 0.90 0.88 0.93  CALCIUM 8.3* 8.2* 8.5* 8.4* 8.6*  MG  --  1.7  --   --   --    Liver Function Tests:  Recent Labs Lab 02/12/15 0907 02/14/15  0636  AST 37 34  ALT 20 17  ALKPHOS 106 107  BILITOT 1.4* 0.9  PROT 7.2 6.0*  ALBUMIN 2.2* 2.0*   No results for input(s): LIPASE, AMYLASE in the last 168 hours.  Recent Labs Lab 02/12/15 0907 02/15/15 1500 02/17/15 0510  AMMONIA 57* 103* 96*   CBC:  Recent Labs Lab 02/12/15 0907 02/12/15 0926 02/14/15 0636 02/15/15 0550  WBC 5.7  --  7.3 6.9  NEUTROABS 2.4  --   --   --   HGB 13.9 17.3* 12.4* 13.6  HCT 43.0 51.0 37.8* 41.6  MCV 70.0*  --  68.6* 67.2*  PLT 271  --  237 276   Cardiac  Enzymes:  Recent Labs Lab 02/12/15 0907  TROPONINI <0.03   BNP: Invalid input(s): POCBNP CBG:  Recent Labs Lab 02/16/15 1647 02/16/15 2128 02/17/15 0754 02/17/15 1150 02/17/15 1634  GLUCAP 182* 140* 116* 189* 246*    Recent Results (from the past 240 hour(s))  Culture, body fluid-bottle     Status: None   Collection Time: 02/12/15 10:57 AM  Result Value Ref Range Status   Specimen Description FLUID ABDOMEN PERITONEAL  Final   Special Requests BAA  Final   Culture NO GROWTH 5 DAYS  Final   Report Status 02/17/2015 FINAL  Final  Gram stain     Status: None   Collection Time: 02/12/15 10:57 AM  Result Value Ref Range Status   Specimen Description FLUID ABDOMEN PERITONEAL  Final   Special Requests NONE  Final   Gram Stain   Final    MODERATE WBC PRESENT, PREDOMINANTLY MONONUCLEAR NO ORGANISMS SEEN    Report Status 02/12/2015 FINAL  Final     Scheduled Meds: . benzonatate  200 mg Oral TID  . enoxaparin (LOVENOX) injection  40 mg Subcutaneous Daily  . folic acid  1 mg Oral Daily  . furosemide  80 mg Oral BID  . gabapentin  600 mg Oral TID  . insulin aspart  0-15 Units Subcutaneous TID WC  . insulin aspart  0-5 Units Subcutaneous QHS  . insulin aspart  3 Units Subcutaneous TID WC  . insulin detemir  16 Units Subcutaneous QHS  . lactulose  30 g Oral QID  . levofloxacin  750 mg Oral Q24H  . LORazepam  0-4 mg Intravenous Q12H  . LORazepam  1 mg Intravenous Once  . magnesium oxide  400 mg Oral Daily  . metroNIDAZOLE  500 mg Oral 3 times per day  . multivitamin with minerals  1 tablet Oral Daily  . pantoprazole  40 mg Oral BID  . polyethylene glycol  17 g Oral Daily  . propranolol  10 mg Oral TID  . rifaximin  550 mg Oral BID  . sodium chloride  3 mL Intravenous Q12H  . sodium chloride  3 mL Intravenous Q12H  . spironolactone  100 mg Oral BID  . thiamine  100 mg Oral Daily   Continuous Infusions:    Paris Chir[place], DO  [facility] Pager  [phone]  If 7PM-7AM, please contact night-coverage [email or web address] Password [email or web address] 02/17/2015, 6:13 PM   LOS: 5 days

## 2015-02-18 LAB — BASIC METABOLIC PANEL
Anion gap: 7 (ref 5–15)
BUN: 8 mg/dL (ref 6–20)
CHLORIDE: 102 mmol/L (ref 101–111)
CO2: 22 mmol/L (ref 22–32)
CREATININE: 0.84 mg/dL (ref 0.61–1.24)
Calcium: 8.6 mg/dL — ABNORMAL LOW (ref 8.9–10.3)
Glucose, Bld: 163 mg/dL — ABNORMAL HIGH (ref 65–99)
POTASSIUM: 4.9 mmol/L (ref 3.5–5.1)
Sodium: 131 mmol/L — ABNORMAL LOW (ref 135–145)

## 2015-02-18 LAB — GLUCOSE, CAPILLARY
Glucose-Capillary: 146 mg/dL — ABNORMAL HIGH (ref 65–99)
Glucose-Capillary: 179 mg/dL — ABNORMAL HIGH (ref 65–99)
Glucose-Capillary: 138 mg/dL — ABNORMAL HIGH (ref 65–99)
Glucose-Capillary: 156 mg/dL — ABNORMAL HIGH (ref 65–99)

## 2015-02-18 LAB — AMMONIA: AMMONIA: 184 umol/L — AB (ref 9–35)

## 2015-02-18 MED ORDER — INSULIN DETEMIR 100 UNIT/ML ~~LOC~~ SOLN
18.0000 [IU] | Freq: Every day | SUBCUTANEOUS | Status: DC
  Administered 2015-02-18: 18 [IU] via SUBCUTANEOUS
  Filled 2015-02-18 (×2): qty 0.18

## 2015-02-18 MED ORDER — SPIRONOLACTONE 25 MG PO TABS
100.0000 mg | ORAL_TABLET | Freq: Every day | ORAL | Status: DC
  Administered 2015-02-19: 100 mg via ORAL
  Filled 2015-02-18: qty 4

## 2015-02-18 MED ORDER — PROPRANOLOL HCL 10 MG PO TABS
10.0000 mg | ORAL_TABLET | Freq: Two times a day (BID) | ORAL | Status: DC
  Administered 2015-02-19: 10 mg via ORAL
  Filled 2015-02-18 (×2): qty 1

## 2015-02-18 NOTE — Progress Notes (Addendum)
PROGRESS NOTE  Curtis Estrada ZOX:[id number] DOB: 08/31/1955 DOA: 02/12/2015 PCP: [facility]  Brief History  59 year old male with a history of cirrhosis secondary to alcohol and chronic hepatitis C, diabetes mellitus, hypertension, and coagulopathy presents with worsening leg edema, shortness of breath and abdominal distention. The patient denies any fevers or chills, but states that he has been coughing for the better part of one month without any hemoptysis. He had one episode of nausea and emesis on the day prior to admission without any blood. There was no coffee grounds in his emesis.Unfortunately, the patient continues to drink 3 x 40 oz beers per day. He has been drinking for at least the last 40 years. He continues to smoke  Assessment/Plan: Decompensated liver cirrhosis with large volume ascites -Patient has had paracentesis on 02/12/2015 and 02/15/2015 removing a total of 10.9 L -cell count--suggestive of SBP; cultures will likely be negative as the patient has been on antibiotics -Treat empirically for SBP given low-grade fever and abdominal pain -change back to po diuretics and monitor renal function and electrolytes  -continue propranolol -Check coags-->INR 1.42 -HIV antibody--negative -Change furosemide 80 mg twice a day, continue spironolactone -Spironolactone dose as potassium is rising -decrease propranolol due to soft BP to bid Dyspnea  -The major issue may be the patient's ascites pushing on his diaphragm  -Improved after paracentesis -Presently 100% on room air without any distress -Echocardiogram-09/20/2014 showed EF 55-60%, grade 1 diastolic dysfunction Hepatic encephalopathy -Only 1 bowel movement yesterday--02/17/2015 -Only having one bowel movement--> increase lactulose 30 g every 6 hours -ammonia--103-->96-->184??accuracy -continue rifaximin -d/c nortriptyline for now alcohol dependence -Alcohol withdrawal protocol -Cessation  discussed Tobacco abuse -Suspect the patient has underlying COPD -Duo nebs when necessary shortness of breath and coughing -Presently stable on room air Diabetes mellitus type 2 -Hemoglobin A1c 9.3 on 09/17/14 -Continue Levemir 16 units daily -NovoLog sliding scale  -Increase Novolog 4 units with meals Lower extremity edema  -due to liver disease -Diuretics as discussed-->improving  -urine protein/creatinine ratio-->0.13  Family Communication: sisters mamie and joyce updated 02/15/15 Disposition Plan: Home 1-2 days if stable  Procedures/Studies: Dg Chest 2 View  02/12/2015   CLINICAL DATA:  Pain in the right ribs after fall yesterday. Initial encounter.  EXAM: CHEST  2 VIEW  COMPARISON:  09/19/2014  FINDINGS: Low lung volumes with bilateral linear opacities, increased from previous. There is no edema, consolidation, effusion, or pneumothorax.  No indication of displaced rib fracture.  Normal heart size and stable mild aortic tortuosity.  IMPRESSION: 1. No traumatic findings. 2. Low lung volumes with bilateral atelectasis.   Electronically Signed   By: Marnee Spring M.D.   On: 02/12/2015 09:57   US Paracentesis  02/15/2015   INDICATION: Hepatitis-C, cirrhosis, recurrent ascites. Request is made for therapeutic paracentesis.  EXAM: ULTRASOUND-GUIDED THERAPEUTIC  PARACENTESIS  COMPARISON:  Prior paracentesis on 02/12/2015  MEDICATIONS: None.  COMPLICATIONS: None immediate  TECHNIQUE: Informed written consent was obtained from the patient after a discussion of the risks, benefits and alternatives to treatment. A timeout was performed prior to the initiation of the procedure.  Initial ultrasound scanning demonstrates a large amount of ascites within the left lower abdominal quadrant. The left lower abdomen was prepped and draped in the usual sterile fashion. 1% lidocaine was used for local anesthesia. Under direct ultrasound guidance, a 19 gauge, 7-cm, Yueh catheter was introduced. An ultrasound  image was saved for documentation purposed. The paracentesis was performed. The  catheter was removed and a dressing was applied. The patient tolerated the procedure well without immediate post procedural complication.  FINDINGS: A total of approximately 6.4 liters of hazy, yellow fluid was removed.  IMPRESSION: Successful ultrasound-guided therapeutic paracentesis yielding 6.4 liters of peritoneal fluid.  Read by: Jeananne Rama, PA-C   Electronically Signed   By: Irish Lack M.D.   On: 02/15/2015 12:28   US Paracentesis  02/12/2015   INDICATION: Ascites, request for paracentesis.  EXAM: ULTRASOUND-GUIDED PARACENTESIS  COMPARISON:  Paracentesis 09/20/14.  MEDICATIONS: None.  COMPLICATIONS: None immediate  TECHNIQUE: Informed written consent was obtained from the patient after a discussion of the risks, benefits and alternatives to treatment. A timeout was performed prior to the initiation of the procedure.  Initial ultrasound scanning demonstrates a large amount of ascites within the left upper abdominal quadrant. The left upper abdomen was prepped and draped in the usual sterile fashion. 1% lidocaine was used for local anesthesia.  Under direct ultrasound guidance, a 19 gauge, 7-cm, Yueh catheter was introduced. An ultrasound image was saved for documentation purposed. The paracentesis was performed. The catheter was removed and a dressing was applied. The patient tolerated the procedure well without immediate post procedural complication.  FINDINGS: A total of approximately 4.5 liters of serous fluid was removed. Samples were sent to the laboratory as requested by the clinical team.  IMPRESSION: Successful ultrasound-guided paracentesis yielding 4.5 liters of peritoneal fluid.  Read By:  Pattricia Boss PA-C   Electronically Signed   By: Irish Lack M.D.   On: 02/12/2015 12:10         Subjective:  patient claimed to have 3 bowel movements today, but none has been documented by the nursing staff. She  denies any fevers, chills, chest pain, shortness of breath, nausea, vomiting, diarrhea, abdominal pain, dysuria, hematuria. No rashes.   Objective: Filed Vitals:   02/18/15 0500 02/18/15 0600 02/18/15 1042 02/18/15 1203  BP:  85/60 107/69 90/55  Pulse:  93 78 100  Temp:  98.1 F (36.7 C)  97.5 F (36.4 C)  TempSrc:  Oral  Oral  Resp:  18    Height:      Weight: 83 kg (182 lb 15.7 oz)     SpO2:  97%  94%    Intake/Output Summary (Last 24 hours) at 02/18/15 1652 Last data filed at 02/18/15 1421  Gross per 24 hour  Intake    480 ml  Output    250 ml  Net    230 ml   Weight change: 1.7 kg (3 lb 12 oz) Exam:   General:  Pt is alert, follows commands appropriately, not in acute distress  HEENT: No icterus, No thrush, No neck mass, [place]/AT  Cardiovascular: RRR, S1/S2, no rubs, no gallops  Respiratory: bibasilar crackles. No wheezing  Abdomen: Soft/+BS, non tender, non distended, no guarding; positive fluid wave; no hepatosplenomegaly   Extremities: trace LE edema, No lymphangitis, No petechiae, No rashes, no synovitis; no cyanosis or clubbing   Data Reviewed: Basic Metabolic Panel:  Recent Labs Lab 02/14/15 0636 02/15/15 0550 02/16/15 0551 02/17/15 0510 02/18/15 0940  NA 134* 133* 134* 133* 131*  K 4.3 3.6 4.3 4.5 4.9  CL 105 98* 100* 100* 102  CO2 24 29 26 26 22   GLUCOSE 163* 141* 168* 151* 163*  BUN 6 6 6 6 8   CREATININE 0.90 0.90 0.88 0.93 0.84  CALCIUM 8.2* 8.5* 8.4* 8.6* 8.6*  MG 1.7  --   --   --   --  Liver Function Tests:  Recent Labs Lab 02/12/15 0907 02/14/15 0636  AST 37 34  ALT 20 17  ALKPHOS 106 107  BILITOT 1.4* 0.9  PROT 7.2 6.0*  ALBUMIN 2.2* 2.0*   No results for input(s): LIPASE, AMYLASE in the last 168 hours.  Recent Labs Lab 02/12/15 0907 02/15/15 1500 02/17/15 0510 02/18/15 0940  AMMONIA 57* 103* 96* 184*   CBC:  Recent Labs Lab 02/12/15 0907 02/12/15 0926 02/14/15 0636 02/15/15 0550  WBC 5.7  --  7.3 6.9   NEUTROABS 2.4  --   --   --   HGB 13.9 17.3* 12.4* 13.6  HCT 43.0 51.0 37.8* 41.6  MCV 70.0*  --  68.6* 67.2*  PLT 271  --  237 276   Cardiac Enzymes:  Recent Labs Lab 02/12/15 0907  TROPONINI <0.03   BNP: Invalid input(s): POCBNP CBG:  Recent Labs Lab 02/17/15 1150 02/17/15 1634 02/17/15 2120 02/18/15 0756 02/18/15 1201  GLUCAP 189* 246* 167* 146* 179*    Recent Results (from the past 240 hour(s))  Culture, body fluid-bottle     Status: None   Collection Time: 02/12/15 10:57 AM  Result Value Ref Range Status   Specimen Description FLUID ABDOMEN PERITONEAL  Final   Special Requests BAA  Final   Culture NO GROWTH 5 DAYS  Final   Report Status 02/17/2015 FINAL  Final  Gram stain     Status: None   Collection Time: 02/12/15 10:57 AM  Result Value Ref Range Status   Specimen Description FLUID ABDOMEN PERITONEAL  Final   Special Requests NONE  Final   Gram Stain   Final    MODERATE WBC PRESENT, PREDOMINANTLY MONONUCLEAR NO ORGANISMS SEEN    Report Status 02/12/2015 FINAL  Final     Scheduled Meds: . benzonatate  200 mg Oral TID  . enoxaparin (LOVENOX) injection  40 mg Subcutaneous Daily  . folic acid  1 mg Oral Daily  . furosemide  80 mg Oral BID  . gabapentin  600 mg Oral TID  . insulin aspart  0-15 Units Subcutaneous TID WC  . insulin aspart  0-5 Units Subcutaneous QHS  . insulin aspart  4 Units Subcutaneous TID WC  . insulin detemir  16 Units Subcutaneous QHS  . lactulose  30 g Oral QID  . levofloxacin  750 mg Oral Q24H  . LORazepam  1 mg Intravenous Once  . magnesium oxide  400 mg Oral Daily  . metroNIDAZOLE  500 mg Oral 3 times per day  . multivitamin with minerals  1 tablet Oral Daily  . pantoprazole  40 mg Oral BID  . polyethylene glycol  17 g Oral Daily  . propranolol  10 mg Oral TID  . rifaximin  550 mg Oral BID  . sodium chloride  3 mL Intravenous Q12H  . sodium chloride  3 mL Intravenous Q12H  . spironolactone  100 mg Oral BID  .  thiamine  100 mg Oral Daily   Continuous Infusions:    Olivene Cookston, DO  [facility] Pager [phone]  If 7PM-7AM, please contact night-coverage [email or web address] Password [email or web address] 02/18/2015, 4:52 PM   LOS: 6 days

## 2015-02-19 DIAGNOSIS — R188 Other ascites: Secondary | ICD-10-CM

## 2015-02-19 LAB — BASIC METABOLIC PANEL
Anion gap: 10 (ref 5–15)
BUN: 8 mg/dL (ref 6–20)
CHLORIDE: 101 mmol/L (ref 101–111)
CO2: 20 mmol/L — AB (ref 22–32)
Calcium: 8.5 mg/dL — ABNORMAL LOW (ref 8.9–10.3)
Creatinine, Ser: 0.8 mg/dL (ref 0.61–1.24)
GFR calc Af Amer: >60 mL/min (ref >60)
GFR calc non Af Amer: >60 mL/min (ref >60)
Glucose, Bld: 121 mg/dL — ABNORMAL HIGH (ref 65–99)
POTASSIUM: 4.2 mmol/L (ref 3.5–5.1)
Sodium: 131 mmol/L — ABNORMAL LOW (ref 135–145)

## 2015-02-19 LAB — AMMONIA: AMMONIA: 87 umol/L — AB (ref 9–35)

## 2015-02-19 LAB — MAGNESIUM: MAGNESIUM: 1.8 mg/dL (ref 1.7–2.4)

## 2015-02-19 LAB — GLUCOSE, CAPILLARY
GLUCOSE-CAPILLARY: 207 mg/dL — AB (ref 65–99)
Glucose-Capillary: 110 mg/dL — ABNORMAL HIGH (ref 65–99)

## 2015-02-19 MED ORDER — INSULIN DETEMIR 100 UNIT/ML ~~LOC~~ SOLN: 18.0000 [IU] | Freq: Every day | SUBCUTANEOUS | Status: DC

## 2015-02-19 MED ORDER — LEVOFLOXACIN 750 MG PO TABS: 750.0000 mg | ORAL_TABLET | ORAL | Status: DC

## 2015-02-19 MED ORDER — SPIRONOLACTONE 100 MG PO TABS: 100.0000 mg | ORAL_TABLET | Freq: Every day | ORAL | Status: DC

## 2015-02-19 MED ORDER — LACTULOSE 10 GM/15ML PO SOLN: 20.0000 g | Freq: Three times a day (TID) | ORAL | Status: DC

## 2015-02-19 MED ORDER — METRONIDAZOLE 500 MG PO TABS: 500.0000 mg | ORAL_TABLET | Freq: Three times a day (TID) | ORAL | Status: DC

## 2015-02-19 MED ORDER — FUROSEMIDE 80 MG PO TABS: 80.0000 mg | ORAL_TABLET | Freq: Two times a day (BID) | ORAL | Status: DC

## 2015-02-19 MED ORDER — PROPRANOLOL HCL 10 MG PO TABS: 10.0000 mg | ORAL_TABLET | Freq: Two times a day (BID) | ORAL | Status: DC

## 2015-02-19 NOTE — Progress Notes (Signed)
Physical Therapy Treatment Patient Details Name: Curtis Estrada MRN: [id number] DOB: Oct 29, 1955 Today's Date: 02/19/2015    History of Present Illness 59 y.o. male admitted to [facility] on 02/12/15 for SOB and abdominal distension.  Pt dx with decompendated liver cirrhosis with large volume ascites, dyspnea, hepatic encephalopathy, and alcohol dependence.  Pt with significant PMHx of HTN, DM, hepatitis C, anemia, constipation, and chronic R great toe wound.    PT Comments    Curtis Estrada required supervision for ambulation this session but did not need RW.  Reports he only uses RW in morning when is unsteady and will continue to do this upon d/c to home.  Pt will benefit from continued skilled PT services while he remains in the acute setting to increase functional independence and safety.  Will have intermittent assist from sisters upon d/c.   Follow Up Recommendations  No PT follow up     Equipment Recommendations  None recommended by PT    Recommendations for Other Services       Precautions / Restrictions Precautions Precautions: Fall Precaution Comments: at baseline pt uses RW in the morning Restrictions Weight Bearing Restrictions: No    Mobility  Bed Mobility Overal bed mobility: Modified Independent             General bed mobility comments: Pt expressed difficulty w/ "getting out of bed".  Pt was Ind w/ getting into bed.  Was unable to sit up from supine so was provided VCs for rolling sidelying and sitting up which pt was able to do w/ mod I.    Transfers Overall transfer level: Needs assistance Equipment used: None Transfers: Sit to/from Stand Sit to Stand: Supervision         General transfer comment: Supervision for safety to sit down on bed.  Pt w/ good technique and does not require any cues or physical assist.  Ambulation/Gait Ambulation/Gait assistance: Supervision Ambulation Distance (Feet): 250 Feet Assistive device: None Gait Pattern/deviations:  Step-through pattern;Antalgic;Narrow base of support Gait velocity: decreased Gait velocity interpretation: Below normal speed for age/gender General Gait Details: Pt up in bathroom upon PT arrival w/o use of RW.  Pt required supervision for ambulation and did not require any assist to maintain stability.   Stairs            Wheelchair Mobility    Modified Rankin (Stroke Patients Only)       Balance Overall balance assessment: Needs assistance Sitting-balance support: No upper extremity supported;Feet supported Sitting balance-Leahy Scale: Good     Standing balance support: No upper extremity supported;During functional activity Standing balance-Leahy Scale: Fair                      Cognition Arousal/Alertness: Awake/alert Behavior During Therapy: WFL for tasks assessed/performed Overall Cognitive Status: No family/caregiver present to determine baseline cognitive functioning (seems slow to process)                      Exercises      General Comments General comments (skin integrity, edema, etc.): Pt expresses concern about cooking at home alone as he says he is afraid he will "leave something on and forget about it".  Usually has sisters come over and help him cook, encouranged pt to continue w/ this.  Questioned pt about any other concerns about returning home which he denied (except bed moblity, see comments above).      Pertinent Vitals/Pain Pain Assessment: No/denies pain Pain Intervention(s): Limited  activity within patient's tolerance;Monitored during session    Home Living                      Prior Function            PT Goals (current goals can now be found in the care plan section) Acute Rehab PT Goals Patient Stated Goal: to go home PT Goal Formulation: With patient Time For Goal Achievement: 03/03/15 Potential to Achieve Goals: Good Progress towards PT goals: Progressing toward goals    Frequency  Min 3X/week     PT Plan Current plan remains appropriate    Co-evaluation             End of Session Equipment Utilized During Treatment: Gait belt Activity Tolerance: Patient tolerated treatment well Patient left: in bed;with call bell/phone within reach (sitting EOB)     Time: 4098-1191 PT Time Calculation (min) (ACUTE ONLY): 13 min  Charges:  $Gait Training: 8-22 mins                    G Codes:      Michail Jewels PT, [place] [phone] Pager: [phone] 02/19/2015, 11:42 AM

## 2015-02-19 NOTE — Progress Notes (Signed)
Pt verbalizes understanding of all discharge instructions and discharged home in stable condition. 

## 2015-02-19 NOTE — Discharge Summary (Signed)
Physician Discharge Summary  Curtis Estrada YNW:[id number] DOB: 08/13/1955 DOA: 02/12/2015  PCP: [facility]  Admit date: 02/12/2015 Discharge date: 02/19/2015  Recommendations for Outpatient Follow-up:  1. Pt will need to follow up with PCP in 2 weeks post discharge 2. 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 -Patient has had paracentesis on 02/12/2015 and 02/15/2015 removing a total of 10.9 L -He received albumen after each paracentesis -cell count--suggestive of SBP; cultures will likely be negative as the patient has been on antibiotics -Treat empirically for SBP given low-grade fever and abdominal pain -The patient will go home with levofloxacin and Flagyl for 6 more days which will complete 10 of therapy--his abdominal pain resolved after paracentesis and starting antibiotics -change back to po diuretics and monitor renal function and electrolytes  -continue propranolol -Check coags-->INR 1.42 -HIV antibody--negative -Change furosemide 80 mg twice a day, continue spironolactone once daily -Spironolactone dose was decreased as potassium is rising -decrease propranolol due to soft BP to bid Dyspnea  -The major issue may be the patient's ascites pushing on his diaphragm  -Improved after paracentesis -Presently 100% on room air without any distress -Echocardiogram-09/20/2014 showed EF 55-60%, grade 1 diastolic dysfunction Hepatic encephalopathy -Only 1 bowel movement yesterday--02/17/2015 -Only having one bowel movement--> increase lactulose 30 g every 6 hours -ammonia--103-->96-->184??accuracy  -ammonia improved to 87 on the day of discharge. The patient was lucid, alert and oriented 4. -Baseline lactulose dose increased to 20 g 3 times a day  -however, I doubt that the patient will remain compliant  -continue rifaximin -d/c nortriptyline for now -Patient was evaluated by physical therapy.  They did not feel the patient needed any additional home health - alcohol dependence -Alcohol withdrawal protocol -Cessation discussed Tobacco abuse -Suspect the patient has underlying COPD -Duo nebs when necessary shortness of breath and coughing -Presently stable on room air Diabetes mellitus type 2 -Hemoglobin A1c 9.3 on 09/17/14 -Continue Levemir 18 units daily -NovoLog sliding scale  -Increase Novolog 4 units with meals Lower extremity edema  -due to liver disease -Diuretics as discussed-->improving  -urine protein/creatinine ratio-->0.13  Discharge Condition: stable  Disposition: home  Diet: Low-sodium Wt Readings from Last 3 Encounters:  02/19/15 82.1 kg (181 lb)  09/19/14 92.3 kg (203 lb 7.8 oz)  05/30/14 89.359 kg (197 lb)    History of present illness:  58 year old male with a history of cirrhosis secondary to alcohol and chronic hepatitis C, diabetes mellitus, hypertension, and coagulopathy presents with worsening leg edema, shortness of breath and abdominal distention. The patient denies any fevers or chills, but states that he has been coughing for the better part of one month without any hemoptysis. He had one episode of nausea and emesis on the day prior to admission without any blood. There was no coffee grounds in his emesis.Unfortunately, the patient continues to drink 3 x 40 oz beers per day. He has been drinking for at least the last 40 years. He continues to smoke.the patient had 2 separate paracentesis. Cell count suggested SBP. The patient was placed on antibiotics. His abdominal pain improved. The patient was encephalopathic initially. The patient was started on lactulose. His dose was titrated up to have at least 2 bowel movements daily. His ammonia remains elevated but the patient's mental status is back to baseline. The patient was advised to stop alcohol use. He will go home with lactulose 20 g 3 times a day and rifaximin. He'll finish 6  additional days of  antibiotics for his SBP. Paperwork was filled out for the patient's Medicaid and personal care services.     Discharge Exam: Filed Vitals:   02/19/15 0559  BP: 100/82  Pulse: 105  Temp: 98.2 F (36.8 C)  Resp: 18   Filed Vitals:   02/18/15 1735 02/19/15 0014 02/19/15 0500 02/19/15 0559  BP: 99/76 102/65  100/82  Pulse: 92 97  105  Temp: 98 F (36.7 C) 98.9 F (37.2 C)  98.2 F (36.8 C)  TempSrc: Oral Oral  Oral  Resp: 17   18  Height:      Weight:   82.1 kg (181 lb)   SpO2: 96% 93%  96%   General: A&O x 3, NAD, pleasant, cooperative Cardiovascular: RRR, no rub, no gallop, no S3 Respiratory: CTAB, no wheeze, no rhonchi Abdomen:soft, nontender, mildly distended, positive bowel sounds Extremities: trace LE edema, No lymphangitis, no petechiae  Discharge Instructions  Discharge Instructions    Diet - low sodium heart healthy    Complete by:  As directed      Increase activity slowly    Complete by:  As directed             Medication List    STOP taking these medications        metFORMIN 1000 MG tablet  Commonly known as:  GLUCOPHAGE     nortriptyline 50 MG capsule  Commonly known as:  PAMELOR     polyethylene glycol packet  Commonly known as:  MIRALAX / GLYCOLAX     sildenafil 100 MG tablet  Commonly known as:  VIAGRA      TAKE these medications        albuterol 108 (90 BASE) MCG/ACT inhaler  Commonly known as:  PROVENTIL HFA;VENTOLIN HFA  Inhale 2 puffs into the lungs every 2 (two) hours as needed for wheezing or shortness of breath (or coughing).     folic acid 1 MG tablet  Commonly known as:  FOLVITE  Take 1 tablet (1 mg total) by mouth daily.     furosemide 80 MG tablet  Commonly known as:  LASIX  Take 1 tablet (80 mg total) by mouth 2 (two) times daily.     gabapentin 300 MG capsule  Commonly known as:  NEURONTIN  Take 600 mg by mouth 3 (three) times daily.     insulin aspart 100 UNIT/ML injection  Commonly known as:  novoLOG  Inject  0-6 Units into the skin 3 (three) times daily with meals. 0-6 Units, Subcutaneous, 3 times daily with meals  CBG < 70: implement hypoglycemia protocol; CBG 70 - 120: 0 units; CBG 121 - 150: 1 unit; CBG 151 - 200: 2 units; CBG 201 - 250: 3 units; CBG 251 - 300: 5 units; CBG 301 - 350: 7 units; CBG 351 - 400: 9 units; CBG > 400: call MD     insulin detemir 100 UNIT/ML injection  Commonly known as:  LEVEMIR  Inject 0.18 mLs (18 Units total) into the skin at bedtime.     lactulose 10 GM/15ML solution  Commonly known as:  CHRONULAC  Take 30 mLs (20 g total) by mouth 3 (three) times daily.     levofloxacin 750 MG tablet  Commonly known as:  LEVAQUIN  Take 1 tablet (750 mg total) by mouth daily.     Magnesium Oxide 420 MG Tabs  Take 1 tablet by mouth daily.     metroNIDAZOLE 500 MG tablet  Commonly  known as:  FLAGYL  Take 1 tablet (500 mg total) by mouth every 8 (eight) hours.     multivitamin with minerals Tabs tablet  Take 1 tablet by mouth daily.     pantoprazole 40 MG tablet  Commonly known as:  PROTONIX  Take 40 mg by mouth 2 (two) times daily.     polyvinyl alcohol 1.4 % ophthalmic solution  Commonly known as:  LIQUIFILM TEARS  Place 1 drop into both eyes daily as needed (dry eyes).     propranolol 10 MG tablet  Commonly known as:  INDERAL  Take 1 tablet (10 mg total) by mouth 2 (two) times daily.     rifaximin 550 MG Tabs tablet  Commonly known as:  XIFAXAN  Take 1 tablet (550 mg total) by mouth 2 (two) times daily at 10 AM and 5 PM.     spironolactone 100 MG tablet  Commonly known as:  ALDACTONE  Take 1 tablet (100 mg total) by mouth daily.         The results of significant diagnostics from this hospitalization (including imaging, microbiology, ancillary and laboratory) are listed below for reference.    Significant Diagnostic Studies: Dg Chest 2 View  02/12/2015   CLINICAL DATA:  Pain in the right ribs after fall yesterday. Initial encounter.  EXAM: CHEST  2 VIEW   COMPARISON:  09/19/2014  FINDINGS: Low lung volumes with bilateral linear opacities, increased from previous. There is no edema, consolidation, effusion, or pneumothorax.  No indication of displaced rib fracture.  Normal heart size and stable mild aortic tortuosity.  IMPRESSION: 1. No traumatic findings. 2. Low lung volumes with bilateral atelectasis.   Electronically Signed   By: Marnee Spring M.D.   On: 02/12/2015 09:57   US Paracentesis  02/15/2015   INDICATION: Hepatitis-C, cirrhosis, recurrent ascites. Request is made for therapeutic paracentesis.  EXAM: ULTRASOUND-GUIDED THERAPEUTIC  PARACENTESIS  COMPARISON:  Prior paracentesis on 02/12/2015  MEDICATIONS: None.  COMPLICATIONS: None immediate  TECHNIQUE: Informed written consent was obtained from the patient after a discussion of the risks, benefits and alternatives to treatment. A timeout was performed prior to the initiation of the procedure.  Initial ultrasound scanning demonstrates a large amount of ascites within the left lower abdominal quadrant. The left lower abdomen was prepped and draped in the usual sterile fashion. 1% lidocaine was used for local anesthesia. Under direct ultrasound guidance, a 19 gauge, 7-cm, Yueh catheter was introduced. An ultrasound image was saved for documentation purposed. The paracentesis was performed. The catheter was removed and a dressing was applied. The patient tolerated the procedure well without immediate post procedural complication.  FINDINGS: A total of approximately 6.4 liters of hazy, yellow fluid was removed.  IMPRESSION: Successful ultrasound-guided therapeutic paracentesis yielding 6.4 liters of peritoneal fluid.  Read by: Jeananne Rama, PA-C   Electronically Signed   By: Irish Lack M.D.   On: 02/15/2015 12:28   US Paracentesis  02/12/2015   INDICATION: Ascites, request for paracentesis.  EXAM: ULTRASOUND-GUIDED PARACENTESIS  COMPARISON:  Paracentesis 09/20/14.  MEDICATIONS: None.  COMPLICATIONS:  None immediate  TECHNIQUE: Informed written consent was obtained from the patient after a discussion of the risks, benefits and alternatives to treatment. A timeout was performed prior to the initiation of the procedure.  Initial ultrasound scanning demonstrates a large amount of ascites within the left upper abdominal quadrant. The left upper abdomen was prepped and draped in the usual sterile fashion. 1% lidocaine was used for local anesthesia.  Under  direct ultrasound guidance, a 19 gauge, 7-cm, Yueh catheter was introduced. An ultrasound image was saved for documentation purposed. The paracentesis was performed. The catheter was removed and a dressing was applied. The patient tolerated the procedure well without immediate post procedural complication.  FINDINGS: A total of approximately 4.5 liters of serous fluid was removed. Samples were sent to the laboratory as requested by the clinical team.  IMPRESSION: Successful ultrasound-guided paracentesis yielding 4.5 liters of peritoneal fluid.  Read By:  Pattricia Boss PA-C   Electronically Signed   By: Irish Lack M.D.   On: 02/12/2015 12:10     Microbiology: Recent Results (from the past 240 hour(s))  Culture, body fluid-bottle     Status: None   Collection Time: 02/12/15 10:57 AM  Result Value Ref Range Status   Specimen Description FLUID ABDOMEN PERITONEAL  Final   Special Requests BAA  Final   Culture NO GROWTH 5 DAYS  Final   Report Status 02/17/2015 FINAL  Final  Gram stain     Status: None   Collection Time: 02/12/15 10:57 AM  Result Value Ref Range Status   Specimen Description FLUID ABDOMEN PERITONEAL  Final   Special Requests NONE  Final   Gram Stain   Final    MODERATE WBC PRESENT, PREDOMINANTLY MONONUCLEAR NO ORGANISMS SEEN    Report Status 02/12/2015 FINAL  Final     Labs: Basic Metabolic Panel:  Recent Labs Lab 02/14/15 0636 02/15/15 0550 02/16/15 0551 02/17/15 0510 02/18/15 0940 02/19/15 0653  NA 134* 133*  134* 133* 131* 131*  K 4.3 3.6 4.3 4.5 4.9 4.2  CL 105 98* 100* 100* 102 101  CO2 24 29 26 26 22  20*  GLUCOSE 163* 141* 168* 151* 163* 121*  BUN 6 6 6 6 8 8   CREATININE 0.90 0.90 0.88 0.93 0.84 0.80  CALCIUM 8.2* 8.5* 8.4* 8.6* 8.6* 8.5*  MG 1.7  --   --   --   --  1.8   Liver Function Tests:  Recent Labs Lab 02/14/15 0636  AST 34  ALT 17  ALKPHOS 107  BILITOT 0.9  PROT 6.0*  ALBUMIN 2.0*   No results for input(s): LIPASE, AMYLASE in the last 168 hours.  Recent Labs Lab 02/15/15 1500 02/17/15 0510 02/18/15 0940 02/19/15 0653  AMMONIA 103* 96* 184* 87*   CBC:  Recent Labs Lab 02/14/15 0636 02/15/15 0550  WBC 7.3 6.9  HGB 12.4* 13.6  HCT 37.8* 41.6  MCV 68.6* 67.2*  PLT 237 276   Cardiac Enzymes: No results for input(s): CKTOTAL, CKMB, CKMBINDEX, TROPONINI in the last 168 hours. BNP: Invalid input(s): POCBNP CBG:  Recent Labs Lab 02/18/15 0756 02/18/15 1201 02/18/15 1650 02/18/15 2127 02/19/15 0752  GLUCAP 146* 179* 138* 156* 110*    Time coordinating discharge:  Greater than 30 minutes  Signed:  Bridie Colquhoun, DO [facility] Pager: [phone] 02/19/2015, 9:56 AM

## 2015-03-21 ENCOUNTER — Encounter (HOSPITAL_COMMUNITY): Payer: Self-pay | Admitting: Emergency Medicine

## 2015-03-21 ENCOUNTER — Inpatient Hospital Stay (HOSPITAL_COMMUNITY): Payer: Non-veteran care

## 2015-03-21 ENCOUNTER — Emergency Department (HOSPITAL_COMMUNITY): Payer: Non-veteran care

## 2015-03-21 ENCOUNTER — Inpatient Hospital Stay (HOSPITAL_COMMUNITY)
Admission: EM | Admit: 2015-03-21 | Discharge: 2015-03-24 | DRG: 421 | Disposition: A | Discharge status: Home Health | Payer: Non-veteran care | Attending: Internal Medicine | Admitting: Internal Medicine

## 2015-03-21 DIAGNOSIS — Z79899 Other long term (current) drug therapy: Secondary | ICD-10-CM

## 2015-03-21 DIAGNOSIS — Z720 Tobacco use: Secondary | ICD-10-CM | POA: Diagnosis present

## 2015-03-21 DIAGNOSIS — B1920 Unspecified viral hepatitis C without hepatic coma: Secondary | ICD-10-CM | POA: Diagnosis present

## 2015-03-21 DIAGNOSIS — K5900 Constipation, unspecified: Secondary | ICD-10-CM | POA: Diagnosis present

## 2015-03-21 DIAGNOSIS — R140 Abdominal distension (gaseous): Secondary | ICD-10-CM | POA: Diagnosis not present

## 2015-03-21 DIAGNOSIS — K7290 Hepatic failure, unspecified without coma: Secondary | ICD-10-CM | POA: Diagnosis present

## 2015-03-21 DIAGNOSIS — K7031 Alcoholic cirrhosis of liver with ascites: Secondary | ICD-10-CM | POA: Diagnosis present

## 2015-03-21 DIAGNOSIS — F1020 Alcohol dependence, uncomplicated: Secondary | ICD-10-CM | POA: Diagnosis not present

## 2015-03-21 DIAGNOSIS — Z880 Allergy status to penicillin: Secondary | ICD-10-CM | POA: Diagnosis not present

## 2015-03-21 DIAGNOSIS — I10 Essential (primary) hypertension: Secondary | ICD-10-CM | POA: Diagnosis present

## 2015-03-21 DIAGNOSIS — K766 Portal hypertension: Secondary | ICD-10-CM | POA: Diagnosis present

## 2015-03-21 DIAGNOSIS — D509 Iron deficiency anemia, unspecified: Secondary | ICD-10-CM | POA: Diagnosis present

## 2015-03-21 DIAGNOSIS — R188 Other ascites: Secondary | ICD-10-CM | POA: Diagnosis present

## 2015-03-21 DIAGNOSIS — R000 Tachycardia, unspecified: Secondary | ICD-10-CM | POA: Diagnosis present

## 2015-03-21 DIAGNOSIS — F17210 Nicotine dependence, cigarettes, uncomplicated: Secondary | ICD-10-CM | POA: Diagnosis present

## 2015-03-21 DIAGNOSIS — E1165 Type 2 diabetes mellitus with hyperglycemia: Secondary | ICD-10-CM | POA: Diagnosis present

## 2015-03-21 DIAGNOSIS — K7030 Alcoholic cirrhosis of liver without ascites: Secondary | ICD-10-CM | POA: Diagnosis present

## 2015-03-21 DIAGNOSIS — D689 Coagulation defect, unspecified: Secondary | ICD-10-CM | POA: Diagnosis present

## 2015-03-21 DIAGNOSIS — Z794 Long term (current) use of insulin: Secondary | ICD-10-CM

## 2015-03-21 DIAGNOSIS — M549 Dorsalgia, unspecified: Secondary | ICD-10-CM | POA: Diagnosis not present

## 2015-03-21 DIAGNOSIS — K7460 Unspecified cirrhosis of liver: Secondary | ICD-10-CM | POA: Diagnosis present

## 2015-03-21 DIAGNOSIS — IMO0002 Reserved for concepts with insufficient information to code with codable children: Secondary | ICD-10-CM | POA: Diagnosis present

## 2015-03-21 DIAGNOSIS — R600 Localized edema: Secondary | ICD-10-CM | POA: Diagnosis present

## 2015-03-21 DIAGNOSIS — Z7951 Long term (current) use of inhaled steroids: Secondary | ICD-10-CM

## 2015-03-21 DIAGNOSIS — Z79891 Long term (current) use of opiate analgesic: Secondary | ICD-10-CM | POA: Diagnosis not present

## 2015-03-21 DIAGNOSIS — F10288 Alcohol dependence with other alcohol-induced disorder: Secondary | ICD-10-CM | POA: Diagnosis not present

## 2015-03-21 HISTORY — DX: Headache: R51

## 2015-03-21 HISTORY — DX: Gastro-esophageal reflux disease without esophagitis: K21.9

## 2015-03-21 HISTORY — DX: Unspecified viral hepatitis C without hepatic coma: B19.20

## 2015-03-21 HISTORY — DX: Type 2 diabetes mellitus without complications: E11.9

## 2015-03-21 HISTORY — DX: Hyperlipidemia, unspecified: E78.5

## 2015-03-21 HISTORY — DX: Pneumonia, unspecified organism: J18.9

## 2015-03-21 HISTORY — DX: Headache, unspecified: R51.9

## 2015-03-21 HISTORY — DX: Unspecified osteoarthritis, unspecified site: M19.90

## 2015-03-21 HISTORY — DX: Unspecified chronic bronchitis: J42

## 2015-03-21 LAB — LACTATE DEHYDROGENASE, PLEURAL OR PERITONEAL FLUID: LD FL: 48 U/L — AB (ref 3–23)

## 2015-03-21 LAB — BODY FLUID CELL COUNT WITH DIFFERENTIAL
EOS FL: 0 %
Lymphs, Fluid: 91 %
Monocyte-Macrophage-Serous Fluid: 9 % — ABNORMAL LOW (ref 50–90)
Neutrophil Count, Fluid: 0 % (ref 0–25)
Total Nucleated Cell Count, Fluid: 310 cu mm (ref 0–1000)

## 2015-03-21 LAB — LIPASE, BLOOD: Lipase: 33 U/L (ref 22–51)

## 2015-03-21 LAB — GRAM STAIN

## 2015-03-21 LAB — BASIC METABOLIC PANEL
ANION GAP: 7 (ref 5–15)
BUN: 6 mg/dL (ref 6–20)
CHLORIDE: 98 mmol/L — AB (ref 101–111)
CO2: 29 mmol/L (ref 22–32)
Calcium: 8.8 mg/dL — ABNORMAL LOW (ref 8.9–10.3)
Creatinine, Ser: 0.95 mg/dL (ref 0.61–1.24)
GFR calc Af Amer: >60 mL/min (ref >60)
GFR calc non Af Amer: >60 mL/min (ref >60)
Glucose, Bld: 140 mg/dL — ABNORMAL HIGH (ref 65–99)
POTASSIUM: 4.3 mmol/L (ref 3.5–5.1)
SODIUM: 134 mmol/L — AB (ref 135–145)

## 2015-03-21 LAB — HEPATIC FUNCTION PANEL
ALBUMIN: 2.2 g/dL — AB (ref 3.5–5.0)
ALT: 20 U/L (ref 17–63)
AST: 45 U/L — AB (ref 15–41)
Alkaline Phosphatase: 138 U/L — ABNORMAL HIGH (ref 38–126)
Bilirubin, Direct: 0.5 mg/dL (ref 0.1–0.5)
Indirect Bilirubin: 0.6 mg/dL (ref 0.3–0.9)
Total Bilirubin: 1.1 mg/dL (ref 0.3–1.2)
Total Protein: 7 g/dL (ref 6.5–8.1)

## 2015-03-21 LAB — PROTEIN, BODY FLUID: Total protein, fluid: <3 g/dL

## 2015-03-21 LAB — GLUCOSE, SEROUS FLUID: GLUCOSE FL: 172 mg/dL

## 2015-03-21 LAB — MAGNESIUM: Magnesium: 1.5 mg/dL — ABNORMAL LOW (ref 1.7–2.4)

## 2015-03-21 LAB — URINALYSIS, ROUTINE W REFLEX MICROSCOPIC
BILIRUBIN URINE: NEGATIVE
GLUCOSE, UA: NEGATIVE mg/dL
HGB URINE DIPSTICK: NEGATIVE
Ketones, ur: NEGATIVE mg/dL
Leukocytes, UA: NEGATIVE
Nitrite: NEGATIVE
Protein, ur: NEGATIVE mg/dL
SPECIFIC GRAVITY, URINE: 1.007 (ref 1.005–1.030)
Urobilinogen, UA: 1 mg/dL (ref 0.0–1.0)
pH: 7 (ref 5.0–8.0)

## 2015-03-21 LAB — I-STAT TROPONIN, ED: Troponin i, poc: 0 ng/mL (ref 0.00–0.08)

## 2015-03-21 LAB — GLUCOSE, CAPILLARY
GLUCOSE-CAPILLARY: 200 mg/dL — AB (ref 65–99)
Glucose-Capillary: 109 mg/dL — ABNORMAL HIGH (ref 65–99)
Glucose-Capillary: 181 mg/dL — ABNORMAL HIGH (ref 65–99)

## 2015-03-21 LAB — CBC
HEMATOCRIT: 41.3 % (ref 39.0–52.0)
Hemoglobin: 13.4 g/dL (ref 13.0–17.0)
MCH: 22.3 pg — ABNORMAL LOW (ref 26.0–34.0)
MCHC: 32.4 g/dL (ref 30.0–36.0)
MCV: 68.6 fL — AB (ref 78.0–100.0)
Platelets: 296 10*3/uL (ref 150–400)
RBC: 6.02 MIL/uL — ABNORMAL HIGH (ref 4.22–5.81)
RDW: 20.3 % — AB (ref 11.5–15.5)
WBC: 8.9 10*3/uL (ref 4.0–10.5)

## 2015-03-21 LAB — LACTATE DEHYDROGENASE: LDH: 167 U/L (ref 98–192)

## 2015-03-21 LAB — ETHANOL: Alcohol, Ethyl (B): <5 mg/dL (ref <5)

## 2015-03-21 LAB — PROTIME-INR
INR: 1.35 (ref 0.00–1.49)
Prothrombin Time: 16.8 seconds — ABNORMAL HIGH (ref 11.6–15.2)

## 2015-03-21 MED ORDER — SPIRONOLACTONE 25 MG PO TABS
50.0000 mg | ORAL_TABLET | Freq: Every day | ORAL | Status: DC
  Administered 2015-03-22 – 2015-03-24 (×3): 50 mg via ORAL
  Filled 2015-03-21 (×3): qty 2

## 2015-03-21 MED ORDER — MAGNESIUM SULFATE 4 GM/100ML IV SOLN
4.0000 g | Freq: Once | INTRAVENOUS | Status: AC
  Administered 2015-03-21: 4 g via INTRAVENOUS
  Filled 2015-03-21: qty 100

## 2015-03-21 MED ORDER — LIDOCAINE HCL (PF) 1 % IJ SOLN
INTRAMUSCULAR | Status: AC
  Filled 2015-03-21: qty 10

## 2015-03-21 MED ORDER — RIFAXIMIN 550 MG PO TABS
550.0000 mg | ORAL_TABLET | Freq: Two times a day (BID) | ORAL | Status: DC
  Administered 2015-03-21 – 2015-03-24 (×7): 550 mg via ORAL
  Filled 2015-03-21 (×7): qty 1

## 2015-03-21 MED ORDER — FUROSEMIDE 40 MG PO TABS
40.0000 mg | ORAL_TABLET | Freq: Two times a day (BID) | ORAL | Status: DC
  Administered 2015-03-22 – 2015-03-24 (×5): 40 mg via ORAL
  Filled 2015-03-21 (×6): qty 1

## 2015-03-21 MED ORDER — ONDANSETRON HCL 4 MG/2ML IJ SOLN: 4.0000 mg | Freq: Four times a day (QID) | INTRAMUSCULAR | Status: DC | PRN

## 2015-03-21 MED ORDER — POLYVINYL ALCOHOL 1.4 % OP SOLN
1.0000 [drp] | Freq: Every day | OPHTHALMIC | Status: DC | PRN
  Filled 2015-03-21: qty 15

## 2015-03-21 MED ORDER — INSULIN ASPART 100 UNIT/ML ~~LOC~~ SOLN
0.0000 [IU] | Freq: Three times a day (TID) | SUBCUTANEOUS | Status: DC
  Administered 2015-03-21 – 2015-03-22 (×3): 3 [IU] via SUBCUTANEOUS
  Administered 2015-03-22 – 2015-03-23 (×2): 2 [IU] via SUBCUTANEOUS
  Administered 2015-03-23: 3 [IU] via SUBCUTANEOUS
  Administered 2015-03-23: 5 [IU] via SUBCUTANEOUS
  Administered 2015-03-24 (×2): 3 [IU] via SUBCUTANEOUS

## 2015-03-21 MED ORDER — OXYCODONE HCL 5 MG PO TABS
5.0000 mg | ORAL_TABLET | ORAL | Status: DC | PRN
  Administered 2015-03-21 – 2015-03-23 (×4): 5 mg via ORAL
  Filled 2015-03-21 (×4): qty 1

## 2015-03-21 MED ORDER — ALUM & MAG HYDROXIDE-SIMETH 200-200-20 MG/5ML PO SUSP: 30.0000 mL | Freq: Four times a day (QID) | ORAL | Status: DC | PRN

## 2015-03-21 MED ORDER — MAGNESIUM OXIDE 400 (241.3 MG) MG PO TABS
400.0000 mg | ORAL_TABLET | Freq: Every day | ORAL | Status: DC
  Administered 2015-03-22 – 2015-03-24 (×3): 400 mg via ORAL
  Filled 2015-03-21 (×3): qty 1

## 2015-03-21 MED ORDER — ADULT MULTIVITAMIN W/MINERALS CH: 1.0000 | ORAL_TABLET | Freq: Every day | ORAL | Status: DC

## 2015-03-21 MED ORDER — PROPRANOLOL HCL 10 MG PO TABS
10.0000 mg | ORAL_TABLET | Freq: Two times a day (BID) | ORAL | Status: DC
  Administered 2015-03-22 – 2015-03-24 (×4): 10 mg via ORAL
  Filled 2015-03-21 (×8): qty 1

## 2015-03-21 MED ORDER — THIAMINE HCL 100 MG/ML IJ SOLN: 100.0000 mg | Freq: Every day | INTRAMUSCULAR | Status: DC

## 2015-03-21 MED ORDER — LACTULOSE 10 GM/15ML PO SOLN
30.0000 g | Freq: Two times a day (BID) | ORAL | Status: DC
  Administered 2015-03-21 – 2015-03-24 (×6): 30 g via ORAL
  Filled 2015-03-21 (×6): qty 45

## 2015-03-21 MED ORDER — PANTOPRAZOLE SODIUM 40 MG PO TBEC
40.0000 mg | DELAYED_RELEASE_TABLET | Freq: Two times a day (BID) | ORAL | Status: DC
  Administered 2015-03-21 – 2015-03-24 (×6): 40 mg via ORAL
  Filled 2015-03-21 (×6): qty 1

## 2015-03-21 MED ORDER — ADULT MULTIVITAMIN W/MINERALS CH
1.0000 | ORAL_TABLET | Freq: Every day | ORAL | Status: DC
  Administered 2015-03-21 – 2015-03-24 (×4): 1 via ORAL
  Filled 2015-03-21 (×4): qty 1

## 2015-03-21 MED ORDER — VITAMIN B-1 100 MG PO TABS
100.0000 mg | ORAL_TABLET | Freq: Every day | ORAL | Status: DC
  Administered 2015-03-21 – 2015-03-24 (×4): 100 mg via ORAL
  Filled 2015-03-21 (×4): qty 1

## 2015-03-21 MED ORDER — ONDANSETRON HCL 4 MG PO TABS: 4.0000 mg | ORAL_TABLET | Freq: Four times a day (QID) | ORAL | Status: DC | PRN

## 2015-03-21 MED ORDER — FOLIC ACID 1 MG PO TABS
1.0000 mg | ORAL_TABLET | Freq: Every day | ORAL | Status: DC
  Administered 2015-03-24: 1 mg via ORAL
  Filled 2015-03-21: qty 1

## 2015-03-21 MED ORDER — GABAPENTIN 300 MG PO CAPS
600.0000 mg | ORAL_CAPSULE | Freq: Three times a day (TID) | ORAL | Status: DC
  Administered 2015-03-21 – 2015-03-24 (×9): 600 mg via ORAL
  Filled 2015-03-21 (×9): qty 2

## 2015-03-21 MED ORDER — ALBUTEROL SULFATE (2.5 MG/3ML) 0.083% IN NEBU: 2.5000 mg | INHALATION_SOLUTION | RESPIRATORY_TRACT | Status: DC | PRN

## 2015-03-21 MED ORDER — ENOXAPARIN SODIUM 40 MG/0.4ML ~~LOC~~ SOLN
40.0000 mg | SUBCUTANEOUS | Status: DC
Start: 2015-03-21 — End: 2015-03-24
  Administered 2015-03-21 – 2015-03-23 (×3): 40 mg via SUBCUTANEOUS
  Filled 2015-03-21 (×3): qty 0.4

## 2015-03-21 MED ORDER — ALBUMIN HUMAN 25 % IV SOLN
50.0000 g | Freq: Once | INTRAVENOUS | Status: AC
  Administered 2015-03-21: 50 g via INTRAVENOUS
  Filled 2015-03-21 (×2): qty 200

## 2015-03-21 MED ORDER — FOLIC ACID 1 MG PO TABS
1.0000 mg | ORAL_TABLET | Freq: Every day | ORAL | Status: DC
  Administered 2015-03-21 – 2015-03-23 (×3): 1 mg via ORAL
  Filled 2015-03-21 (×3): qty 1

## 2015-03-21 MED ORDER — SODIUM CHLORIDE 0.9 % IJ SOLN: 3.0000 mL | INTRAMUSCULAR | Status: DC | PRN

## 2015-03-21 MED ORDER — ALBUTEROL SULFATE HFA 108 (90 BASE) MCG/ACT IN AERS: 2.0000 | INHALATION_SPRAY | RESPIRATORY_TRACT | Status: DC | PRN

## 2015-03-21 MED ORDER — INSULIN DETEMIR 100 UNIT/ML ~~LOC~~ SOLN
18.0000 [IU] | Freq: Every day | SUBCUTANEOUS | Status: DC
  Administered 2015-03-21 – 2015-03-23 (×3): 18 [IU] via SUBCUTANEOUS
  Filled 2015-03-21 (×4): qty 0.18

## 2015-03-21 MED ORDER — IPRATROPIUM BROMIDE 0.02 % IN SOLN: 0.5000 mg | RESPIRATORY_TRACT | Status: DC | PRN

## 2015-03-21 MED ORDER — LORAZEPAM 2 MG/ML IJ SOLN: 1.0000 mg | Freq: Four times a day (QID) | INTRAMUSCULAR | Status: AC | PRN

## 2015-03-21 MED ORDER — SODIUM CHLORIDE 0.9 % IJ SOLN
3.0000 mL | Freq: Two times a day (BID) | INTRAMUSCULAR | Status: DC
  Administered 2015-03-21 – 2015-03-24 (×6): 3 mL via INTRAVENOUS

## 2015-03-21 MED ORDER — LORAZEPAM 1 MG PO TABS: 1.0000 mg | ORAL_TABLET | Freq: Four times a day (QID) | ORAL | Status: AC | PRN

## 2015-03-21 MED ORDER — SODIUM CHLORIDE 0.9 % IV SOLN: 250.0000 mL | INTRAVENOUS | Status: DC | PRN

## 2015-03-21 MED ORDER — INFLUENZA VAC SPLIT QUAD 0.5 ML IM SUSY
0.5000 mL | PREFILLED_SYRINGE | INTRAMUSCULAR | Status: AC
  Administered 2015-03-22: 0.5 mL via INTRAMUSCULAR
  Filled 2015-03-21: qty 0.5

## 2015-03-21 NOTE — ED Notes (Signed)
Pt from home for eval of back pain and abdominal swelling x5 days, pt states hx of abd ascites and had a paracentesis in august 2016. Pt states increased sob and left sided cp with increase in swelling as well. Pt in nad, ambulatory in triage.

## 2015-03-21 NOTE — Progress Notes (Signed)
Inaccurate reading

## 2015-03-21 NOTE — Progress Notes (Signed)
Held Lasix due to low BP. MD notified.

## 2015-03-21 NOTE — Progress Notes (Signed)
Patient's BP 95/65, pulse 94. RN held schedule Inderal  for low BP. Floor coverage K. Schorr paged and notified.

## 2015-03-21 NOTE — Progress Notes (Signed)
Marcellino Fidalgo is a 59 y.o. male patient admitted from ED awake, alert - oriented  X 4 - no acute distress noted.  VSS - Blood pressure 101/72, pulse 96, temperature 98.5 F (36.9 C), temperature source Oral, resp. rate 18, SpO2 98 %.    IV in place, occlusive dsg intact without redness.  Orientation to room, and floor completed with information packet given to patient/family.  Patient declined safety video at this time.  Admission INP armband ID verified with patient/family, and in place.   SR up x 2, fall assessment complete, with patient and family able to verbalize understanding of risk associated with falls, and verbalized understanding to call nsg before up out of bed.  Call light within reach, patient able to voice, and demonstrate understanding.  Skin, clean-dry- intact without evidence of bruising, or skin tears.   No evidence of skin break down noted on exam.     Will cont to eval and treat per MD orders.  Rudean Haskell, RN 03/21/2015 4:18 PM

## 2015-03-21 NOTE — H&P (Signed)
[facility] History and Physical  Curtis Estrada ZOX:[id number] DOB: 1955-11-10 DOA: 03/21/2015  Referring physician: Dr. Gwendolyn Grant PCP: [facility]   Chief Complaint: Fluid in stomach/back pain  HPI: Curtis Estrada is a 59 y.o. male  With history of decompensated cirrhosis secondary to hepatitis C and alcohol abuse with portal hypertension, coagulopathy, history of alcohol abuse, diabetes mellitus, constipation was recently hospitalized from 02/12/2015 to 02/19/2015 for decompensated liver cirrhosis with large volume ascites with associated hepatic encephalopathy and dyspnea. Patient presents to the ED with a five-day history of worsening abdominal bloating with abdominal tightness and pain radiating to his back, abdominal discomfort, chronic generalized weakness, productive cough of clear sputum with some scant blood, lower extremity edema to his knees. Patient denies any fevers, no chills, no nausea, no vomiting, no diarrhea, no constipation, no dysuria, no melanotic, no hematemesis, no hematochezia. Patient denies any chest pain. Patient with some occasional shortness of breath. Patient stated that he quit drinking Alcohol since his last hospitalization in August 2016. Patient was seen in the emergency room, basic metabolic profile done was unremarkable. Hepatic profile done at out phosphatase of 138 hour lumen of 2.2 AST of 45 ALT of 20 otherwise was within normal limits. INR was 1.35. CBC unremarkable. Chest x-ray with no significant changes compared to prior chest x-ray. [facility] were called to admit the patient for further evaluation and management.    Review of Systems: As per history of present illness otherwise negative. Constitutional:  No weight loss, night sweats, Fevers, chills, fatigue.  HEENT:  No headaches, Difficulty swallowing,Tooth/dental problems,Sore throat,  No sneezing, itching, ear ache, nasal congestion, post nasal drip,  Cardio-vascular:  No chest  pain, Orthopnea, PND, swelling in lower extremities, anasarca, dizziness, palpitations  GI:  No heartburn, indigestion, abdominal pain, nausea, vomiting, diarrhea, change in bowel habits, loss of appetite  Resp:  No shortness of breath with exertion or at rest. No excess mucus, no productive cough, No non-productive cough, No coughing up of blood.No change in color of mucus.No wheezing.No chest wall deformity  Skin:  no rash or lesions.  GU:  no dysuria, change in color of urine, no urgency or frequency. No flank pain.  Musculoskeletal:  No joint pain or swelling. No decreased range of motion. No back pain.  Psych:  No change in mood or affect. No depression or anxiety. No memory loss.   Past Medical History  Diagnosis Date  . Hypertension   . Diabetes mellitus without complication   . HCV (hepatitis C virus)   . 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 ETOH abuse   . Ascites 01/2013  . Hematoma 01/2013    posterior right flank from a fall  . 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   . Anemia 02/2013  . Hyponatremia 01/2013  . Unspecified constipation 04/04/2013  . H pylori ulcer 03/05/2013  . Gastric ulcer with hemorrhage 03/03/2013  . Chronic wound of extremity-right great toe 03/03/2013  . Acute blood loss anemia 03/03/2013  . Spontaneous bacterial peritonitis 09/20/2014   Past Surgical History  Procedure Laterality Date  . Hernia repair    . Tonsillectomy    . Esophagogastroduodenoscopy N/A 03/03/2013    Procedure: ESOPHAGOGASTRODUODENOSCOPY (EGD);  Surgeon: Beverley Fiedler, MD;  Location: [facility] ENDOSCOPY;  Service: Gastroenterology;  Laterality: N/A;  Bedside   Social History:  reports that he has been smoking Cigarettes.  He has a 11.25 pack-year smoking history. He has never used  smokeless tobacco. He reports that he does not drink alcohol or use illicit drugs.  Allergies  Allergen Reactions  . Penicillins     Childhood Has received Rocephin w/o reaction     Family History  Problem Relation Age of Onset  . Dementia Mother   . Cancer - Other Father     Prior to Admission medications   Medication Sig Start Date End Date Taking? Authorizing Provider  albuterol (PROVENTIL HFA;VENTOLIN HFA) 108 (90 BASE) MCG/ACT inhaler Inhale 2 puffs into the lungs every 2 (two) hours as needed for wheezing or shortness of breath (or coughing). 05/30/14  Yes Dione Booze, MD  folic acid (FOLVITE) 1 MG tablet Take 1 tablet (1 mg total) by mouth daily. 09/21/14  Yes Maryruth Bun Rama, MD  furosemide (LASIX) 80 MG tablet Take 1 tablet (80 mg total) by mouth 2 (two) times daily. 02/19/15  Yes Catarina Hartshorn, MD  gabapentin (NEURONTIN) 300 MG capsule Take 600 mg by mouth 3 (three) times daily.   Yes Historical Provider, MD  insulin aspart (NOVOLOG) 100 UNIT/ML injection Inject 0-6 Units into the skin 3 (three) times daily with meals. 0-6 Units, Subcutaneous, 3 times daily with meals  CBG < 70: implement hypoglycemia protocol; CBG 70 - 120: 0 units; CBG 121 - 150: 1 unit; CBG 151 - 200: 2 units; CBG 201 - 250: 3 units; CBG 251 - 300: 5 units; CBG 301 - 350: 7 units; CBG 351 - 400: 9 units; CBG > 400: call MD 03/10/13  Yes Shanker Levora Dredge, MD  insulin detemir (LEVEMIR) 100 UNIT/ML injection Inject 0.18 mLs (18 Units total) into the skin at bedtime. 02/19/15  Yes Catarina Hartshorn, MD  Magnesium Oxide 420 MG TABS Take 1 tablet by mouth daily.   Yes Historical Provider, MD  Multiple Vitamin (MULTIVITAMIN WITH MINERALS) TABS tablet Take 1 tablet by mouth daily. 09/21/14  Yes Christina P Rama, MD  pantoprazole (PROTONIX) 40 MG tablet Take 40 mg by mouth 2 (two) times daily.    Yes Historical Provider, MD  polyvinyl alcohol (LIQUIFILM TEARS) 1.4 % ophthalmic solution Place 1 drop into both eyes daily as needed (dry eyes).    Yes Historical Provider, MD  propranolol (INDERAL) 10 MG tablet Take 1 tablet (10 mg total) by mouth 2 (two) times daily. 02/19/15  Yes Catarina Hartshorn, MD  spironolactone (ALDACTONE)  100 MG tablet Take 1 tablet (100 mg total) by mouth daily. 02/19/15  Yes Catarina Hartshorn, MD   Physical Exam: Filed Vitals:   03/21/15 0945 03/21/15 1015 03/21/15 1045 03/21/15 1100  BP: 115/97 118/83 112/83 104/85  Pulse: 99 98 96 95  Temp:      TempSrc:      Resp: SpO2: 97% 96% 96% 94%    Wt Readings from Last 3 Encounters:  02/19/15 82.1 kg (181 lb)  09/19/14 92.3 kg (203 lb 7.8 oz)  05/30/14 89.359 kg (197 lb)    General:  Appears calm and comfortable, laying on gurney in no acute cardiopulmonary distress. Speaking in full sentences. Eyes: PERRLA, EOMI, normal lids, irises & conjunctiva ENT: grossly normal hearing, lips & tongue Neck: no LAD, masses or thyromegaly Cardiovascular: RRR, no m/r/g. 2-3+ bilateral lower extremity edema after the mid thighs. Telemetry: SR, no arrhythmias  Respiratory: CTA bilaterally, no w/r/r. Normal respiratory effort. Abdomen: Significantly distended, tightness, positive bowel sounds, nontender to palpation, Skin: no rash or induration seen on limited exam Musculoskeletal: grossly normal tone BUE/BLE Psychiatric: grossly normal  mood and affect, speech fluent and appropriate Neurologic: Alert and oriented 3. Cranial nerves II through XII are grossly intact. No focal deficits.           Labs on Admission:  Basic Metabolic Panel:  Recent Labs Lab 03/21/15 0901  NA 134*  K 4.3  CL 98*  CO2 29  GLUCOSE 140*  BUN 6  CREATININE 0.95  CALCIUM 8.8*   Liver Function Tests:  Recent Labs Lab 03/21/15 0959  AST 45*  ALT 20  ALKPHOS 138*  BILITOT 1.1  PROT 7.0  ALBUMIN 2.2*   No results for input(s): LIPASE, AMYLASE in the last 168 hours. No results for input(s): AMMONIA in the last 168 hours. CBC:  Recent Labs Lab 03/21/15 0901  WBC 8.9  HGB 13.4  HCT 41.3  MCV 68.6*  PLT 296   Cardiac Enzymes: No results for input(s): CKTOTAL, CKMB, CKMBINDEX, TROPONINI in the last 168 hours.  BNP (last 3 results)  Recent  Labs  09/19/14 1920 02/12/15 0907  BNP 29.8 19.0    ProBNP (last 3 results) No results for input(s): PROBNP in the last 8760 hours.  CBG: No results for input(s): GLUCAP in the last 168 hours.  Radiological Exams on Admission: Dg Chest 2 View  03/21/2015   CLINICAL DATA:  Chest pain, cough.  EXAM: CHEST  2 VIEW  COMPARISON:  February 12, 2015.  FINDINGS: The heart size and mediastinal contours are within normal limits. No pneumothorax or significant pleural effusion is noted right lung is clear. Grossly stable linear densities are noted in left lung base most consistent with scarring or subsegmental atelectasis. The visualized skeletal structures are unremarkable.  IMPRESSION: Grossly stable left basilar linear densities are noted most consistent with scarring or subsegmental atelectasis. No significant changes noted compared to prior exam.   Electronically Signed   By: Lupita Raider, M.D.   On: 03/21/2015 09:45    EKG: Independently reviewed. Q waves in leads 3, aVF. T-wave inversion in leads 3. Unchanged from prior EKG.  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 Patient states when he was last discharge was unable 12 for both antibiotics and a such just took one antibiotic otherwise has been compliant with his medications. Patient presented with abdominal distention with tightness and discomfort. ED physician states has already spoken with interventional radiology for a  therapeutic paracentesis. Patient will likely also need a diagnostic paracentesis as well and a such labs will be ordered. Will hold off on empiric Antibiotics for possible SBP while Gram stain and culture pending. Patient's blood pressure is borderline and as such will place on half home regimen of Lasix and Aldactone. Will place patient on Xifaxan and lactulose. Continue his home regimen of propranolol Will also give some albumin prior to paracentesis. Follow.  #2 hypertension Continue propranolol. Continue half home dose diuretics as blood pressure seems to be borderline at this time.  #3 microcytic anemia Stable.  #4 history of alcohol dependence Patient states he quit alcohol use since his last hospitalization.  Will place on the Ativan withdrawal protocol.  #5 bilateral lower extremity edema Likely secondary to problem #1. Continue diuretics have half home dose due to borderline blood pressure.  #6 tobacco abuse Placed on a nicotine patch.  #7 uncontrolled type 2 diabetes mellitus Check a hemoglobin A1c. Continue home dose Lantus. Place on sliding scale insulin.  #8 prophylaxis PPI for GI prophylaxis. Lovenox for DVT prophylaxis.   Code Status: Full DVT Prophylaxis: Lovenox Family Communication: Updated patient. No family at bedside. Disposition Plan: Admit to MedSurg.  Time spent: 65 minutes  THOMPSON,DANIEL M.D. [facility] Pager [phone]

## 2015-03-21 NOTE — Evaluation (Signed)
Physical Therapy Evaluation Patient Details Name: Curtis Estrada MRN: [id number] DOB: 06-12-1956 Today's Date: 03/21/2015   History of Present Illness  With history of decompensated cirrhosis secondary to hepatitis C and alcohol abuse with portal hypertension, coagulopathy, history of alcohol abuse, diabetes mellitus, constipation was recently hospitalized from 02/12/2015 to 02/19/2015 for decompensated liver cirrhosis with large volume ascites with associated hepatic encephalopathy and dyspnea. Patient presents to the ED with a five-day history of worsening abdominal bloating with abdominal tightness and pain radiating to his back, abdominal discomfort, chronic generalized weakness, productive cough of clear sputum with some scant blood, lower extremity edema to his knees. Patient denies any fevers, no chills, no nausea, no vomiting, no diarrhea, no constipation, no dysuria, no melanotic, no hematemesis, no hematochezia. Patient denies any chest pain. Patient with some occasional shortness of breath. Patient stated that he quit drinking Alcohol since his last hospitalization in August 2016.  Clinical Impression  Pt admitted with above diagnosis. Pt currently with functional limitations due to the deficits listed below (see PT Problem List). Pt hesitant to work much with PT.  Hasn't been on floor very long as he just came up from ED and lunch in room.  Pt did perform a few exercises and rolled.  Pt agrees to ambulate with PT tomorrow.  Per pt and chart pt was ambulating in ED today.  Will follow acutely.   Pt will benefit from skilled PT to increase their independence and safety with mobility to allow discharge to the venue listed below.      Follow Up Recommendations Home health PT;Supervision - Intermittent    Equipment Recommendations  None recommended by PT    Recommendations for Other Services       Precautions / Restrictions Precautions Precautions: Fall Restrictions Weight Bearing  Restrictions: No      Mobility  Bed Mobility Overal bed mobility: Independent             General bed mobility comments: incr time needed.    Transfers                 General transfer comment: Refused to get OOB.  States he will try to get OOB tomorrow.  BP has been soft.  Ambulation/Gait             General Gait Details: Declined ambulation however notes state he was ambulating in ED.    Stairs            Wheelchair Mobility    Modified Rankin (Stroke Patients Only)       Balance Overall balance assessment: Needs assistance;History of Falls Sitting-balance support: No upper extremity supported;Feet supported Sitting balance-Leahy Scale: Fair                                       Pertinent Vitals/Pain Pain Assessment: 0-10 Pain Score: 9  Pain Location: back pain Pain Descriptors / Indicators: Aching Pain Intervention(s): Limited activity within patient's tolerance;Monitored during session;Repositioned;Premedicated before session  VSS with BP somewhat low with nurse aware.     Home Living Family/patient expects to be discharged to:: Private residence Living Arrangements: Alone Available Help at Discharge: Friend(s);Available PRN/intermittently;Family (sisters) Type of Home: Apartment Home Access: Elevator     Home Layout: Multi-level (Has elevator-pt on 14th floor) Home Equipment: Walker - 2 wheels;Cane - single point      Prior Function Level of Independence: Independent with assistive  device(s)         Comments: per pt he uses the RW when he first gets up and going in the AM and then he doesn't need it after he gets going.      Hand Dominance   Dominant Hand: Right    Extremity/Trunk Assessment   Upper Extremity Assessment: Defer to OT evaluation           Lower Extremity Assessment: Generalized weakness      Cervical / Trunk Assessment: Normal  Communication   Communication: No difficulties   Cognition Arousal/Alertness: Awake/alert Behavior During Therapy: WFL for tasks assessed/performed Overall Cognitive Status: Within Functional Limits for tasks assessed                      General Comments      Exercises General Exercises - Upper Extremity Shoulder Flexion: AROM;Both;5 reps;Supine Shoulder Horizontal ABduction: AROM;Both;5 reps;Supine General Exercises - Lower Extremity Ankle Circles/Pumps: AROM;Both;5 reps;Supine Heel Slides: AROM;Both;5 reps;Supine      Assessment/Plan    PT Assessment Patient needs continued PT services  PT Diagnosis Generalized weakness;Acute pain   PT Problem List Decreased activity tolerance;Decreased balance;Decreased mobility;Decreased knowledge of use of DME;Decreased safety awareness;Decreased knowledge of precautions;Pain  PT Treatment Interventions DME instruction;Gait training;Therapeutic activities;Functional mobility training;Therapeutic exercise;Balance training;Patient/family education   PT Goals (Current goals can be found in the Care Plan section) Acute Rehab PT Goals Patient Stated Goal: to go home PT Goal Formulation: With patient Time For Goal Achievement: 04/04/15 Potential to Achieve Goals: Good    Frequency Min 3X/week   Barriers to discharge Decreased caregiver support      Co-evaluation               End of Session   Activity Tolerance: Patient limited by fatigue Patient left: in bed;with call bell/phone within reach Nurse Communication: Mobility status         Time: 1610-9604 PT Time Calculation (min) (ACUTE ONLY): 10 min   Charges:   PT Evaluation $Initial PT Evaluation Tier I: 1 Procedure     PT G CodesBerline Lopes 04-09-2015, 2:03 PM Dawood Spitler [facility] Acute Rehabilitation [phone] (pager)

## 2015-03-21 NOTE — Procedures (Signed)
   US guided RUQ para  7 liters cloudy yellow fluid removed Tolerated well  Sent for labs per MD

## 2015-03-21 NOTE — ED Notes (Signed)
Patient transported to Ultrasound 

## 2015-03-21 NOTE — ED Provider Notes (Signed)
CSN: [id number]     Arrival date & time 03/21/15  4132 History   First MD Initiated Contact with Patient 03/21/15 [phone]     Chief Complaint  Patient presents with  . Back Pain  . Bloated     (Consider location/radiation/quality/duration/timing/severity/associated sxs/prior Treatment) HPI Comments: Hx of ascites, requires large volume drainage routinely.  Patient is a 59 y.o. male presenting with back pain and abdominal pain. The history is provided by the patient.  Back Pain Associated symptoms: abdominal pain   Associated symptoms: no chest pain and no fever   Abdominal Pain Pain location:  Generalized Pain quality: bloating and fullness   Pain radiates to:  Back Pain severity:  Moderate Onset quality:  Gradual Duration:  5 days Timing:  Constant Progression:  Unchanged Chronicity:  Recurrent Context: not recent illness and not sick contacts   Relieved by:  Nothing Worsened by:  Nothing tried Associated symptoms: no chest pain, no cough, no fever, no shortness of breath and no vomiting     Past Medical History  Diagnosis Date  . Hypertension   . Diabetes mellitus without complication   . HCV (hepatitis C virus)   . 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 ETOH abuse   . Ascites 01/2013  . Hematoma 01/2013    posterior right flank from a fall  . 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   . Anemia 02/2013  . Hyponatremia 01/2013  . Unspecified constipation 04/04/2013  . H pylori ulcer 03/05/2013  . Gastric ulcer with hemorrhage 03/03/2013  . Chronic wound of extremity-right great toe 03/03/2013  . Acute blood loss anemia 03/03/2013  . Spontaneous bacterial peritonitis 09/20/2014   Past Surgical History  Procedure Laterality Date  . Hernia repair    . Tonsillectomy    . Esophagogastroduodenoscopy N/A 03/03/2013    Procedure: ESOPHAGOGASTRODUODENOSCOPY (EGD);  Surgeon: Beverley Fiedler, MD;  Location: [facility] ENDOSCOPY;  Service: Gastroenterology;  Laterality: N/A;   Bedside   Family History  Problem Relation Age of Onset  . Dementia Mother   . Cancer - Other Father    Social History  Substance Use Topics  . Smoking status: Current Every Day Smoker -- 0.25 packs/day for 45 years    Types: Cigarettes  . Smokeless tobacco: Never Used  . Alcohol Use: No     Comment: no drinking since 02/2015    Review of Systems  Constitutional: Negative for fever.  Respiratory: Negative for cough and shortness of breath.   Cardiovascular: Negative for chest pain.  Gastrointestinal: Positive for abdominal pain. Negative for vomiting.  Musculoskeletal: Positive for back pain.  All other systems reviewed and are negative.     Allergies  Penicillins  Home Medications   Prior to Admission medications   Medication Sig Start Date End Date Taking? Authorizing Provider  albuterol (PROVENTIL HFA;VENTOLIN HFA) 108 (90 BASE) MCG/ACT inhaler Inhale 2 puffs into the lungs every 2 (two) hours as needed for wheezing or shortness of breath (or coughing). 05/30/14   Dione Booze, MD  folic acid (FOLVITE) 1 MG tablet Take 1 tablet (1 mg total) by mouth daily. 09/21/14   Maryruth Bun Rama, MD  furosemide (LASIX) 80 MG tablet Take 1 tablet (80 mg total) by mouth 2 (two) times daily. 02/19/15   Catarina Hartshorn, MD  gabapentin (NEURONTIN) 300 MG capsule Take 600 mg by mouth 3 (three) times daily.    Historical Provider, MD  insulin aspart (NOVOLOG) 100 UNIT/ML  injection Inject 0-6 Units into the skin 3 (three) times daily with meals. 0-6 Units, Subcutaneous, 3 times daily with meals  CBG < 70: implement hypoglycemia protocol; CBG 70 - 120: 0 units; CBG 121 - 150: 1 unit; CBG 151 - 200: 2 units; CBG 201 - 250: 3 units; CBG 251 - 300: 5 units; CBG 301 - 350: 7 units; CBG 351 - 400: 9 units; CBG > 400: call MD 03/10/13   Maretta Bees, MD  insulin detemir (LEVEMIR) 100 UNIT/ML injection Inject 0.18 mLs (18 Units total) into the skin at bedtime. 02/19/15   Catarina Hartshorn, MD  lactulose (CHRONULAC)  10 GM/15ML solution Take 30 mLs (20 g total) by mouth 3 (three) times daily. 02/19/15   Catarina Hartshorn, MD  levofloxacin (LEVAQUIN) 750 MG tablet Take 1 tablet (750 mg total) by mouth daily. 02/19/15   Catarina Hartshorn, MD  Magnesium Oxide 420 MG TABS Take 1 tablet by mouth daily.    Historical Provider, MD  metroNIDAZOLE (FLAGYL) 500 MG tablet Take 1 tablet (500 mg total) by mouth every 8 (eight) hours. 02/19/15   Catarina Hartshorn, MD  Multiple Vitamin (MULTIVITAMIN WITH MINERALS) TABS tablet Take 1 tablet by mouth daily. 09/21/14   Christina P Rama, MD  pantoprazole (PROTONIX) 40 MG tablet Take 40 mg by mouth 2 (two) times daily.     Historical Provider, MD  polyvinyl alcohol (LIQUIFILM TEARS) 1.4 % ophthalmic solution Place 1 drop into both eyes daily as needed (dry eyes).     Historical Provider, MD  propranolol (INDERAL) 10 MG tablet Take 1 tablet (10 mg total) by mouth 2 (two) times daily. 02/19/15   Catarina Hartshorn, MD  rifaximin (XIFAXAN) 550 MG TABS tablet Take 1 tablet (550 mg total) by mouth 2 (two) times daily at 10 AM and 5 PM. Patient not taking: Reported on 05/30/2014 03/10/13   Maretta Bees, MD  spironolactone (ALDACTONE) 100 MG tablet Take 1 tablet (100 mg total) by mouth daily. 02/19/15   Catarina Hartshorn, MD   BP 115/97 mmHg  Pulse 99  Temp(Src) 98.3 F (36.8 C) (Oral)  Resp 19  SpO2 97% Physical Exam  Constitutional: He is oriented to person, place, and time. He appears well-developed and well-nourished. No distress.  HENT:  Head: Normocephalic and atraumatic.  Mouth/Throat: No oropharyngeal exudate.  Eyes: EOM are normal. Pupils are equal, round, and reactive to light.  Neck: Normal range of motion. Neck supple.  Cardiovascular: Normal rate and regular rhythm.  Exam reveals no friction rub.   No murmur heard. Pulmonary/Chest: Effort normal and breath sounds normal. No respiratory distress. He has no wheezes. He has no rales.  Abdominal: He exhibits distension (severe). There is tenderness (mild). There is  no rebound.  Musculoskeletal: Normal range of motion. He exhibits no edema.  Neurological: He is alert and oriented to person, place, and time.  Skin: He is not diaphoretic.  Nursing note and vitals reviewed.   ED Course  Procedures (including critical care time) Labs Review Labs Reviewed  BASIC METABOLIC PANEL - Abnormal; Notable for the following:    Sodium 134 (*)    Chloride 98 (*)    Glucose, Bld 140 (*)    Calcium 8.8 (*)    All other components within normal limits  CBC - Abnormal; Notable for the following:    RBC 6.02 (*)    MCV 68.6 (*)    MCH 22.3 (*)    RDW 20.3 (*)    All other  components within normal limits  PROTIME-INR  HEPATIC FUNCTION PANEL  Rosezena Sensor, ED    Imaging Review Dg Chest 2 View  03/21/2015   CLINICAL DATA:  Chest pain, cough.  EXAM: CHEST  2 VIEW  COMPARISON:  February 12, 2015.  FINDINGS: The heart size and mediastinal contours are within normal limits. No pneumothorax or significant pleural effusion is noted right lung is clear. Grossly stable linear densities are noted in left lung base most consistent with scarring or subsegmental atelectasis. The visualized skeletal structures are unremarkable.  IMPRESSION: Grossly stable left basilar linear densities are noted most consistent with scarring or subsegmental atelectasis. No significant changes noted compared to prior exam.   Electronically Signed   By: Lupita Raider, M.D.   On: 03/21/2015 09:45   I have personally reviewed and evaluated these images and lab results as part of my medical decision-making.   EKG Interpretation   Date/Time:  Wednesday March 21 2015 08:56:15 EDT Ventricular Rate:  100 PR Interval:  164 QRS Duration: 84 QT Interval:  327 QTC Calculation: 422 R Axis:   61 Text Interpretation:  Sinus tachycardia Inferior infarct, age  indeterminate Lateral leads are also involved No No significant change  since last tracing Confirmed by Gwendolyn Grant  MD, Sumayyah Custodio [phone]) on  03/21/2015  9:43:32 AM     EMERGENCY DEPARTMENT US ABDOMEN Study: Limited Ultrasound of the Abdomen  INDICATIONS:Abdominal pain Indication: Multiple views of the abdominal aorta are obtained from the diaphragmatic hiatus to the aortic bifurcation in transverse and sagittal planes with a multi- Frequency probe.  PERFORMED BY: Myself  IMAGES ARCHIVED?: Yes  FINDINGS: Free fluid present  LIMITATIONS:  Body habitus  INTERPRETATION:  Abdominal free fluid present  COMMENT:  Large volume ascites present  MDM   Final diagnoses:  Ascites    59 year old male here with back and abdominal pain for the past several days. History of ascites that recurs and requires large-volume drainage. Denies any fever, hematemesis, diarrhea, abdominal pain. He does have some abdominal discomfort due to the distention, but no frank pain. Here belly is extremely distended. Ultrasound shows large-volume ascites. We'll plan on IR drainage and admission.    Elwin Mocha, MD 03/21/15 [phone]

## 2015-03-22 DIAGNOSIS — E1165 Type 2 diabetes mellitus with hyperglycemia: Secondary | ICD-10-CM

## 2015-03-22 DIAGNOSIS — Z720 Tobacco use: Secondary | ICD-10-CM

## 2015-03-22 DIAGNOSIS — K7290 Hepatic failure, unspecified without coma: Principal | ICD-10-CM

## 2015-03-22 DIAGNOSIS — R188 Other ascites: Secondary | ICD-10-CM

## 2015-03-22 DIAGNOSIS — I10 Essential (primary) hypertension: Secondary | ICD-10-CM

## 2015-03-22 DIAGNOSIS — F10288 Alcohol dependence with other alcohol-induced disorder: Secondary | ICD-10-CM

## 2015-03-22 DIAGNOSIS — R600 Localized edema: Secondary | ICD-10-CM

## 2015-03-22 DIAGNOSIS — K7031 Alcoholic cirrhosis of liver with ascites: Secondary | ICD-10-CM

## 2015-03-22 DIAGNOSIS — D509 Iron deficiency anemia, unspecified: Secondary | ICD-10-CM

## 2015-03-22 LAB — URINE CULTURE: CULTURE: NO GROWTH

## 2015-03-22 LAB — CBC
HEMATOCRIT: 38.2 % — AB (ref 39.0–52.0)
HEMOGLOBIN: 12.6 g/dL — AB (ref 13.0–17.0)
MCH: 22.3 pg — AB (ref 26.0–34.0)
MCHC: 33 g/dL (ref 30.0–36.0)
MCV: 67.6 fL — AB (ref 78.0–100.0)
PLATELETS: 277 10*3/uL (ref 150–400)
RBC: 5.65 MIL/uL (ref 4.22–5.81)
RDW: 20 % — ABNORMAL HIGH (ref 11.5–15.5)
WBC: 6.9 10*3/uL (ref 4.0–10.5)

## 2015-03-22 LAB — HEMOGLOBIN A1C
Hgb A1c MFr Bld: 8.4 % — ABNORMAL HIGH (ref 4.8–5.6)
Mean Plasma Glucose: 194 mg/dL

## 2015-03-22 LAB — PROTIME-INR
INR: 1.5 — ABNORMAL HIGH (ref 0.00–1.49)
INR: 1.49 (ref 0.00–1.49)
Prothrombin Time: 18.2 seconds — ABNORMAL HIGH (ref 11.6–15.2)
Prothrombin Time: 18.1 seconds — ABNORMAL HIGH (ref 11.6–15.2)

## 2015-03-22 LAB — PH, BODY FLUID: PH, BODY FLUID: 7.9

## 2015-03-22 LAB — GLUCOSE, CAPILLARY
GLUCOSE-CAPILLARY: 121 mg/dL — AB (ref 65–99)
GLUCOSE-CAPILLARY: 177 mg/dL — AB (ref 65–99)
Glucose-Capillary: 168 mg/dL — ABNORMAL HIGH (ref 65–99)
Glucose-Capillary: 124 mg/dL — ABNORMAL HIGH (ref 65–99)

## 2015-03-22 LAB — COMPREHENSIVE METABOLIC PANEL
ALT: 17 U/L (ref 17–63)
ANION GAP: 4 — AB (ref 5–15)
AST: 33 U/L (ref 15–41)
Albumin: 2.3 g/dL — ABNORMAL LOW (ref 3.5–5.0)
Alkaline Phosphatase: 98 U/L (ref 38–126)
BUN: 5 mg/dL — ABNORMAL LOW (ref 6–20)
CHLORIDE: 100 mmol/L — AB (ref 101–111)
CO2: 29 mmol/L (ref 22–32)
CREATININE: 0.84 mg/dL (ref 0.61–1.24)
Calcium: 8.5 mg/dL — ABNORMAL LOW (ref 8.9–10.3)
Glucose, Bld: 122 mg/dL — ABNORMAL HIGH (ref 65–99)
POTASSIUM: 3.7 mmol/L (ref 3.5–5.1)
SODIUM: 133 mmol/L — AB (ref 135–145)
Total Bilirubin: 1.5 mg/dL — ABNORMAL HIGH (ref 0.3–1.2)
Total Protein: 6 g/dL — ABNORMAL LOW (ref 6.5–8.1)

## 2015-03-22 LAB — APTT: APTT: 42 s — AB (ref 24–37)

## 2015-03-22 NOTE — Care Management Note (Signed)
Case Management Note  Patient Details  Name: Curtis Estrada MRN: [id number] Date of Birth: 1955-12-24  Subjective/Objective:   Patient lives alone, he will ned hhpt, he chose [facility] , NCM spoke with Estate agent CSW at the [place], faxed HHPT and tub bench orders over with h/p and pt evals.  She will call me tomorrow to continue setting this up for patient.                   Action/Plan:   Expected Discharge Date:                  Expected Discharge Plan:  Home w Home Health Services  In-House Referral:     Discharge planning Services  CM Consult  Post Acute Care Choice:  Home Health Choice offered to:  Patient  DME Arranged:  Tub bench DME Agency:  [facility]  HH Arranged:  PT [facility] Agency:  [facility]  Status of Service:  In process, will continue to follow  Medicare Important Message Given:    Date Medicare IM Given:    Medicare IM give by:    Date Additional Medicare IM Given:    Additional Medicare Important Message give by:     If discussed at Long Length of Stay Meetings, dates discussed:    Additional Comments:  Leone Haven, RN 03/22/2015, 4:30 PM

## 2015-03-22 NOTE — Evaluation (Signed)
Occupational Therapy Evaluation Patient Details Name: Curtis Estrada MRN: [id number] DOB: 19-Dec-1955 Today's Date: 03/22/2015    History of Present Illness With history of decompensated cirrhosis secondary to hepatitis C and alcohol abuse with portal hypertension, coagulopathy, history of alcohol abuse, diabetes mellitus, constipation was recently hospitalized from 02/12/2015 to 02/19/2015 for decompensated liver cirrhosis with large volume ascites with associated hepatic encephalopathy and dyspnea. Patient presents to the ED with a five-day history of worsening abdominal bloating with abdominal tightness and pain radiating to his back, abdominal discomfort, chronic generalized weakness, productive cough of clear sputum with some scant blood, lower extremity edema to his knees. Patient denies any fevers, no chills, no nausea, no vomiting, no diarrhea, no constipation, no dysuria, no melanotic, no hematemesis, no hematochezia. Patient denies any chest pain. Patient with some occasional shortness of breath. Patient stated that he quit drinking Alcohol since his last hospitalization in August 2016.   Clinical Impression   Patient presenting with deconditioning and increased pain. Patient independent PTA, but unable to don socks secondary to pain/swelling in abdomen. Patient currently functioning at an overall supervision level, requiring min assist for LB ADLs. Pt states he hasn't been able to don socks for awhile, but would like to be able to. Patient will benefit from acute OT to increase overall independence in the areas of ADLs, functional mobility, and overall safety in order to safely discharge home.     Follow Up Recommendations  No OT follow up;Supervision - Intermittent    Equipment Recommendations  Tub/shower bench;Other (comment) (sock aid)    Recommendations for Other Services  None at this time    Precautions / Restrictions Precautions Precautions: Fall Restrictions Weight Bearing  Restrictions: No    Mobility Bed Mobility Overal bed mobility: Independent General bed mobility comments: incr time needed.    Transfers Overall transfer level: Needs assistance Equipment used: None Transfers: Sit to/from Stand Sit to Stand: Supervision         General transfer comment: Supervision for safety. Believe pt will quickly reach an independent > mod I level with mobility.     Balance Overall balance assessment: Needs assistance Sitting-balance support: No upper extremity supported;Feet supported Sitting balance-Leahy Scale: Good     Standing balance support: No upper extremity supported;During functional activity Standing balance-Leahy Scale: Fair    ADL Overall ADL's : Needs assistance/impaired General ADL Comments: Pt reports that it is difficult for him to perform LB ADLs secondary to pain, swelling, fluid in abdomen. Pt reports he likes to wear socks, but is unable. Discussed use of AE with pt, pt reports he has a reacher, LH sponge, and LH shoe horn. Plan to administer sock aid to patient next session and educate pt on how to use it. Pt Engaged in bed mobility, sat EOB, then ambulated > BR. Pt transfered on/off elevated toilet seat with supervision. Educated pt on use of tub transfer bench in tub/shower combination and administerred handout for use of TTB. Encouraged pt to use bench for safe showers.     Pertinent Vitals/Pain Pain Assessment: No/denies pain (recently had pain medication)     Hand Dominance Right   Extremity/Trunk Assessment Upper Extremity Assessment Upper Extremity Assessment: Overall WFL for tasks assessed   Lower Extremity Assessment Lower Extremity Assessment: Generalized weakness   Cervical / Trunk Assessment Cervical / Trunk Assessment: Normal   Communication Communication Communication: No difficulties   Cognition Arousal/Alertness: Awake/alert Behavior During Therapy: WFL for tasks assessed/performed Overall Cognitive Status:  Within Functional Limits for tasks  assessed              Home Living Family/patient expects to be discharged to:: Private residence Living Arrangements: Alone Available Help at Discharge: Friend(s);Available PRN/intermittently;Family (sisters) Type of Home: Apartment Home Access: Elevator     Home Layout: Multi-level Alternate Level Stairs-Number of Steps: apartment on 14th floor   Bathroom Shower/Tub: Tub/shower unit;Curtain   Bathroom Toilet: Handicapped height     Home Equipment: Environmental consultant - 2 wheels;Cane - single point;Adaptive equipment Adaptive Equipment: Reacher;Long-handled shoe horn;Long-handled sponge Additional Comments: Pt reports 2 RWs and 3 canes      Prior Functioning/Environment Level of Independence: Independent with assistive device(s)        Comments: per pt he uses the RW when he first gets up and going in the AM and then he doesn't need it after he gets going.     OT Diagnosis: Generalized weakness;Acute pain   OT Problem List: Decreased strength;Decreased activity tolerance;Impaired balance (sitting and/or standing);Decreased safety awareness;Decreased knowledge of use of DME or AE;Pain   OT Treatment/Interventions: Self-care/ADL training;Therapeutic exercise;Energy conservation;DME and/or AE instruction;Therapeutic activities;Patient/family education;Balance training    OT Goals(Current goals can be found in the care plan section) Acute Rehab OT Goals Patient Stated Goal: to go home OT Goal Formulation: With patient Time For Goal Achievement: 04/05/15 Potential to Achieve Goals: Good ADL Goals Pt Will Perform Grooming: with modified independence;standing Pt Will Perform Lower Body Bathing: with modified independence;sit to/from stand;with adaptive equipment Pt Will Perform Lower Body Dressing: with modified independence;sit to/from stand;with adaptive equipment Pt Will Perform Tub/Shower Transfer: Tub transfer;tub bench;with modified  independence;ambulating Additional ADL Goal #1: Pt will be independent > mod I with mobility throughout room in prep for ADLs and functional BR transfers  OT Frequency: Min 2X/week   Barriers to D/C: Decreased caregiver support   End of Session Activity Tolerance: Patient tolerated treatment well Patient left: in bed;with call bell/phone within reach;with bed alarm set   Time: 1610-9604 OT Time Calculation (min): 16 min Charges:  OT General Charges $OT Visit: 1 Procedure OT Evaluation $Initial OT Evaluation Tier I: 1 Procedure  Jeyren Danowski , MS, OTR/L, CLT Pager: [phone]  03/22/2015, 12:11 PM

## 2015-03-22 NOTE — Progress Notes (Signed)
[facility]                                                                              Patient Demographics  Curtis Estrada, is a 59 y.o. male, DOB - Oct 19, 1955, [id number]  Admit date - 03/21/2015   Admitting Physician [name], MD  Outpatient Primary MD for the patient is [facility]  LOS - 1   Chief Complaint  Patient presents with  . Back Pain  . Bloated      HPI on 03/21/2015 by Dr. Ramiro Harvest Curtis Estrada is a 59 y.o. male with history of decompensated cirrhosis secondary to hepatitis C and alcohol abuse with portal hypertension, coagulopathy, history of alcohol abuse, diabetes mellitus, constipation was recently hospitalized from 02/12/2015 to 02/19/2015 for decompensated liver cirrhosis with large volume ascites with associated hepatic encephalopathy and dyspnea. Patient presents to the ED with a five-day history of worsening abdominal bloating with abdominal tightness and pain radiating to his back, abdominal discomfort, chronic generalized weakness, productive cough of clear sputum with some scant blood, lower extremity edema to his knees. Patient denies any fevers, no chills, no nausea, no vomiting, no diarrhea, no constipation, no dysuria, no melanotic, no hematemesis, no hematochezia. Patient denies any chest pain. Patient with some occasional shortness of breath. Patient stated that he quit drinking Alcohol since his last hospitalization in August 2016.  Patient was seen in the emergency room, basic metabolic profile done was unremarkable. Hepatic profile done at out phosphatase of 138 hour lumen of 2.2 AST of 45 ALT of 20 otherwise was within normal limits. INR was 1.35. CBC unremarkable. Chest x-ray with no significant changes compared to prior chest x-ray. [facility] were called to admit the patient for further evaluation and management.  Assessment & Plan   Decompensated hepatic cirrhosis/recurrent ascites -Patient has had multiple  admissions for abdominal pain and distention -Interventional radiology consultation appreciated -S/p paracentesis on 03/21/2015, 7 L of fluid removal -Fluid labs not consistent with SBP, therefore will hold off on its of bionic -Patient recently admitted and discharged in August 2016, with levofloxacin and Flagyl -Currently LFTs WNL, INR 1.5 -Continue spironolactone, rifaximin, propranolol, lactulose, Lasix -Patient would benefit from outpatient gastroenterology follow-up -Fluid culture pending, Gram stain shows no organisms -At this time, patient does not have renal or hepatic failure, and does not meet guidelines for prophylactic treatment   Hypertension -Continue Lasix, spironolactone, propranolol  Microcytic anemia -Hemoglobin currently 12.6, currently stable -Continue to monitor CBC  History of alcohol dependence -Patient states he stopped drinking approximately one month ago -Continue CIWA protocol  Bilateral lower extremity edema -Likely secondary to the above process -Continue diuretics  Tobacco abuse -Smoking cessation discussed -Continue nicotine patch  Diabetes mellitus, type II -Continue Lantus, insulin sliding scale, CBG monitoring -Hemoglobin A1c 8.4  Code Status: Full  Family Communication: None at bedside.  Left message with sister.  Disposition Plan: Admitted.  Expect discharge within the next 24-48 hours  Time Spent in minutes   30 minutes  Procedures  US guided paracentesis  Consults   Interventional radiology   DVT Prophylaxis  Lovenox   Lab Results  Component Value Date   PLT 277  03/22/2015    Medications  Scheduled Meds: . enoxaparin (LOVENOX) injection  40 mg Subcutaneous Q24H  . folic acid  1 mg Oral Daily  . folic acid  1 mg Oral Daily  . furosemide  40 mg Oral BID  . gabapentin  600 mg Oral TID  . insulin aspart  0-15 Units Subcutaneous TID WC  . insulin detemir  18 Units Subcutaneous QHS  . lactulose  30 g Oral BID  .  magnesium oxide  400 mg Oral Daily  . multivitamin with minerals  1 tablet Oral Daily  . pantoprazole  40 mg Oral BID  . propranolol  10 mg Oral BID  . rifaximin  550 mg Oral BID  . sodium chloride  3 mL Intravenous Q12H  . spironolactone  50 mg Oral Daily  . thiamine  100 mg Oral Daily   Or  . thiamine  100 mg Intravenous Daily   Continuous Infusions:  PRN Meds:.sodium chloride, albuterol, alum & mag hydroxide-simeth, ipratropium, LORazepam **OR** LORazepam, ondansetron **OR** ondansetron (ZOFRAN) IV, oxyCODONE, polyvinyl alcohol, sodium chloride  Antibiotics    Anti-infectives    Start     Dose/Rate Route Frequency Ordered Stop   03/21/15 1300  rifaximin (XIFAXAN) tablet 550 mg     550 mg Oral 2 times daily 03/21/15 1252       Subjective:   Curtis Estrada seen and examined today.  Patient feels his abdominal pain has improved slightly. Continues to complain of some distention. Denies any chest pain, shortness of breath, nausea or vomiting, headache or dizziness.     Objective:   Filed Vitals:   03/21/15 1805 03/21/15 2126 03/22/15 0300 03/22/15 0513  BP: 96/64 95/65  100/70  Pulse: 91 94  83  Temp:  98.2 F (36.8 C)  98.3 F (36.8 C)  TempSrc:  Oral  Oral  Resp:  18  18  Height:    (1.702 m)   Weight:   83 kg (182 lb 15.7 oz)   SpO2:  96%  95%    Wt Readings from Last 3 Encounters:  03/22/15 83 kg (182 lb 15.7 oz)  02/19/15 82.1 kg (181 lb)  09/19/14 92.3 kg (203 lb 7.8 oz)     Intake/Output Summary (Last 24 hours) at 03/22/15 1226 Last data filed at 03/22/15 1113  Gross per 24 hour  Intake    820 ml  Output   1200 ml  Net   -380 ml    Exam  General: Well developed, well nourished, NAD, appears stated age  HEENT: NCAT,  mucous membranes moist.   Cardiovascular: S1 S2 auscultated, no rubs, murmurs or gallops. Regular rate and rhythm.  Respiratory: Clear to auscultation bilaterally with equal chest rise  Abdomen: Soft, nontender, distended, +  bowel sounds  Extremities: warm dry without cyanosis clubbing. 2-3+ pitting edema in LE B/L  Neuro: AAOx3, nonfocal  Data Review   Micro Results Recent Results (from the past 240 hour(s))  Culture, body fluid-bottle     Status: None (Preliminary result)   Collection Time: 03/21/15 12:33 PM  Result Value Ref Range Status   Specimen Description FLUID ABDOMEN PERITONEAL  Final   Special Requests BAA 10CCS  Final   Culture PENDING  Incomplete   Report Status PENDING  Incomplete  Gram stain     Status: None   Collection Time: 03/21/15 12:33 PM  Result Value Ref Range Status   Specimen Description FLUID ABDOMEN PERITONEAL  Final   Special Requests NONE  Final   Gram Stain   Final    MODERATE WBC PRESENT, PREDOMINANTLY MONONUCLEAR NO ORGANISMS SEEN    Report Status 03/21/2015 FINAL  Final  Urine culture     Status: None   Collection Time: 03/21/15  2:49 PM  Result Value Ref Range Status   Specimen Description URINE, RANDOM  Final   Special Requests NONE  Final   Culture NO GROWTH 1 DAY  Final   Report Status 03/22/2015 FINAL  Final    Radiology Reports Dg Chest 2 View  03/21/2015   CLINICAL DATA:  Chest pain, cough.  EXAM: CHEST  2 VIEW  COMPARISON:  February 12, 2015.  FINDINGS: The heart size and mediastinal contours are within normal limits. No pneumothorax or significant pleural effusion is noted right lung is clear. Grossly stable linear densities are noted in left lung base most consistent with scarring or subsegmental atelectasis. The visualized skeletal structures are unremarkable.  IMPRESSION: Grossly stable left basilar linear densities are noted most consistent with scarring or subsegmental atelectasis. No significant changes noted compared to prior exam.   Electronically Signed   By: Lupita Raider, M.D.   On: 03/21/2015 09:45   US Paracentesis  03/21/2015   INDICATION: ascites  EXAM: ULTRASOUND-GUIDED PARACENTESIS  COMPARISON:  Previous paracentesis  MEDICATIONS: 10 cc 1%  lidocaine  COMPLICATIONS: None immediate  TECHNIQUE: Informed written consent was obtained from the patient after a discussion of the risks, benefits and alternatives to treatment. A timeout was performed prior to the initiation of the procedure.  Initial ultrasound scanning demonstrates a large amount of ascites within the right lower abdominal quadrant. The right lower abdomen was prepped and draped in the usual sterile fashion. 1% lidocaine with epinephrine was used for local anesthesia. Under direct ultrasound guidance, a 19 gauge, 7-cm, Yueh catheter was introduced. An ultrasound image was saved for documentation purposed. The paracentesis was performed. The catheter was removed and a dressing was applied. The patient tolerated the procedure well without immediate post procedural complication.  FINDINGS: A total of approximately 7 liters of cloudy yellow fluid was removed. Samples were sent to the laboratory as requested by the clinical team.  IMPRESSION: Successful ultrasound-guided paracentesis yielding 7 liters of peritoneal fluid.  Read by:  Robet Leu [facility]   Electronically Signed   By: Irish Lack M.D.   On: 03/21/2015 14:39    CBC  Recent Labs Lab 03/21/15 0901 03/22/15 0758  WBC 8.9 6.9  HGB 13.4 12.6*  HCT 41.3 38.2*  PLT 296 277  MCV 68.6* 67.6*  MCH 22.3* 22.3*  MCHC 32.4 33.0  RDW 20.3* 20.0*    Chemistries   Recent Labs Lab 03/21/15 0901 03/21/15 0959 03/21/15 1445 03/22/15 0758  NA 134*  --   --  133*  K 4.3  --   --  3.7  CL 98*  --   --  100*  CO2 29  --   --  29  GLUCOSE 140*  --   --  122*  BUN 6  --   --  5*  CREATININE 0.95  --   --  0.84  CALCIUM 8.8*  --   --  8.5*  MG  --   --  1.5*  --   AST  --  45*  --  33  ALT  --  20  --  17  ALKPHOS  --  138*  --  98  BILITOT  --  1.1  --  1.5*   ------------------------------------------------------------------------------------------------------------------ estimated creatinine clearance is 95.2  mL/min (by C-G formula based on Cr of 0.84). ------------------------------------------------------------------------------------------------------------------  Recent Labs  03/21/15 0901  HGBA1C 8.4*   ------------------------------------------------------------------------------------------------------------------ No results for input(s): CHOL, HDL, LDLCALC, TRIG, CHOLHDL, LDLDIRECT in the last 72 hours. ------------------------------------------------------------------------------------------------------------------ No results for input(s): TSH, T4TOTAL, T3FREE, THYROIDAB in the last 72 hours.  Invalid input(s): FREET3 ------------------------------------------------------------------------------------------------------------------ No results for input(s): VITAMINB12, FOLATE, FERRITIN, TIBC, IRON, RETICCTPCT in the last 72 hours.  Coagulation profile  Recent Labs Lab 03/21/15 0959 03/22/15 0758  INR 1.35 1.50*    No results for input(s): DDIMER in the last 72 hours.  Cardiac Enzymes No results for input(s): CKMB, TROPONINI, MYOGLOBIN in the last 168 hours.  Invalid input(s): CK ------------------------------------------------------------------------------------------------------------------ Invalid input(s): POCBNP    Curtis Estrada D.O. on 03/22/2015 at 12:26 PM  Between 7am to 7pm - Pager - [phone]  After 7pm go to [email or web address] - password [email or web address]  And look for the night coverage person covering for me after hours  [facility] Office  [phone]

## 2015-03-23 ENCOUNTER — Inpatient Hospital Stay (HOSPITAL_COMMUNITY): Payer: Non-veteran care

## 2015-03-23 DIAGNOSIS — B1920 Unspecified viral hepatitis C without hepatic coma: Secondary | ICD-10-CM

## 2015-03-23 LAB — COMPREHENSIVE METABOLIC PANEL
ALBUMIN: 2 g/dL — AB (ref 3.5–5.0)
ALT: 20 U/L (ref 17–63)
ANION GAP: 6 (ref 5–15)
AST: 35 U/L (ref 15–41)
Alkaline Phosphatase: 89 U/L (ref 38–126)
BUN: 5 mg/dL — AB (ref 6–20)
CHLORIDE: 98 mmol/L — AB (ref 101–111)
CO2: 27 mmol/L (ref 22–32)
Calcium: 8.3 mg/dL — ABNORMAL LOW (ref 8.9–10.3)
Creatinine, Ser: 0.92 mg/dL (ref 0.61–1.24)
GFR calc Af Amer: >60 mL/min (ref >60)
GFR calc non Af Amer: >60 mL/min (ref >60)
GLUCOSE: 196 mg/dL — AB (ref 65–99)
POTASSIUM: 4.3 mmol/L (ref 3.5–5.1)
Sodium: 131 mmol/L — ABNORMAL LOW (ref 135–145)
Total Bilirubin: 1.3 mg/dL — ABNORMAL HIGH (ref 0.3–1.2)
Total Protein: 6 g/dL — ABNORMAL LOW (ref 6.5–8.1)

## 2015-03-23 LAB — GLUCOSE, CAPILLARY
GLUCOSE-CAPILLARY: 136 mg/dL — AB (ref 65–99)
Glucose-Capillary: 203 mg/dL — ABNORMAL HIGH (ref 65–99)
Glucose-Capillary: 160 mg/dL — ABNORMAL HIGH (ref 65–99)

## 2015-03-23 LAB — CBC
HCT: 39.3 % (ref 39.0–52.0)
Hemoglobin: 12.9 g/dL — ABNORMAL LOW (ref 13.0–17.0)
MCH: 22.3 pg — ABNORMAL LOW (ref 26.0–34.0)
MCHC: 32.8 g/dL (ref 30.0–36.0)
MCV: 68 fL — ABNORMAL LOW (ref 78.0–100.0)
PLATELETS: 260 10*3/uL (ref 150–400)
RBC: 5.78 MIL/uL (ref 4.22–5.81)
RDW: 20.2 % — AB (ref 11.5–15.5)
WBC: 7.6 10*3/uL (ref 4.0–10.5)

## 2015-03-23 MED ORDER — LIDOCAINE HCL (PF) 1 % IJ SOLN
INTRAMUSCULAR | Status: AC
  Filled 2015-03-23: qty 10

## 2015-03-23 NOTE — Progress Notes (Signed)
[facility]                                                                              Patient Demographics  Curtis Estrada, is a 59 y.o. male, DOB - 11/04/1955, [id number]  Admit date - 03/21/2015   Admitting Physician Curtis Bong, MD  Outpatient Primary MD for the patient is [facility]  LOS - 2   Chief Complaint  Patient presents with  . Back Pain  . Bloated      HPI on 03/21/2015 by Dr. Ramiro Harvest Curtis Estrada is a 59 y.o. male with history of decompensated cirrhosis secondary to hepatitis C and alcohol abuse with portal hypertension, coagulopathy, history of alcohol abuse, diabetes mellitus, constipation was recently hospitalized from 02/12/2015 to 02/19/2015 for decompensated liver cirrhosis with large volume ascites with associated hepatic encephalopathy and dyspnea. Patient presents to the ED with a five-day history of worsening abdominal bloating with abdominal tightness and pain radiating to his back, abdominal discomfort, chronic generalized weakness, productive cough of clear sputum with some scant blood, lower extremity edema to his knees. Patient denies any fevers, no chills, no nausea, no vomiting, no diarrhea, no constipation, no dysuria, no melanotic, no hematemesis, no hematochezia. Patient denies any chest pain. Patient with some occasional shortness of breath. Patient stated that he quit drinking Alcohol since his last hospitalization in August 2016.  Patient was seen in the emergency room, basic metabolic profile done was unremarkable. Hepatic profile done at out phosphatase of 138 hour lumen of 2.2 AST of 45 ALT of 20 otherwise was within normal limits. INR was 1.35. CBC unremarkable. Chest x-ray with no significant changes compared to prior chest x-ray. [facility] were called to admit the patient for further evaluation and management.  Assessment & Plan   Decompensated hepatic cirrhosis/recurrent ascites -Patient has had multiple  admissions for abdominal pain and distention -Interventional radiology consultation appreciated -S/p paracentesis on 03/21/2015, 7 L of fluid removal -Fluid labs not consistent with SBP, therefore will hold off on antibiotics -Patient recently admitted and discharged in August 2016, with levofloxacin and Flagyl -Currently LFTs WNL, INR 1.5 -Continue spironolactone, rifaximin, propranolol, lactulose, Lasix -Patient would benefit from outpatient gastroenterology follow-up- Spoke with GI, may increase diuretics.  -Fluid culture shows, no growth to date, Gram stain shows no organisms -At this time, patient does not have renal or hepatic failure, and does not meet guidelines for prophylactic treatment   Hypertension -Continue Lasix, spironolactone, propranolol  Microcytic anemia -Hemoglobin currently 12.9, currently stable -Continue to monitor CBC  History of alcohol dependence -Patient states he stopped drinking approximately one month ago -Continue CIWA protocol  Bilateral lower extremity edema -Likely secondary to the above process -Continue diuretics  Tobacco abuse -Smoking cessation discussed -Continue nicotine patch  Diabetes mellitus, type II -Continue Lantus, insulin sliding scale, CBG monitoring -Hemoglobin A1c 8.4  Physical Deconditioning -PT consulted, recommended HH  Code Status: Full  Family Communication: None at bedside.  Sister via phone  Disposition Plan: Admitted.  Expect discharge within the next 24-48 hours  Time Spent in minutes   30 minutes  Procedures  US guided paracentesis  Consults   Interventional radiology  Gastroenterology, via  phone  DVT Prophylaxis  Lovenox   Lab Results  Component Value Date   PLT 260 03/23/2015    Medications  Scheduled Meds: . enoxaparin (LOVENOX) injection  40 mg Subcutaneous Q24H  . folic acid  1 mg Oral Daily  . furosemide  40 mg Oral BID  . gabapentin  600 mg Oral TID  . insulin aspart  0-15 Units  Subcutaneous TID WC  . insulin detemir  18 Units Subcutaneous QHS  . lactulose  30 g Oral BID  . lidocaine (PF)      . magnesium oxide  400 mg Oral Daily  . multivitamin with minerals  1 tablet Oral Daily  . pantoprazole  40 mg Oral BID  . propranolol  10 mg Oral BID  . rifaximin  550 mg Oral BID  . sodium chloride  3 mL Intravenous Q12H  . spironolactone  50 mg Oral Daily  . thiamine  100 mg Oral Daily   Continuous Infusions:  PRN Meds:.sodium chloride, albuterol, alum & mag hydroxide-simeth, ipratropium, LORazepam **OR** LORazepam, ondansetron **OR** ondansetron (ZOFRAN) IV, oxyCODONE, polyvinyl alcohol, sodium chloride  Antibiotics    Anti-infectives    Start     Dose/Rate Route Frequency Ordered Stop   03/21/15 1300  rifaximin (XIFAXAN) tablet 550 mg     550 mg Oral 2 times daily 03/21/15 1252       Subjective:   [name] seen and examined today.  Patient complains of abdominal distention. Denies pain.  Complains of feeling tired.  Denies any chest pain, shortness of breath, nausea or vomiting, headache or dizziness.     Objective:   Filed Vitals:   03/23/15 0500 03/23/15 0548 03/23/15 1008 03/23/15 1013  BP:  101/80 102/82 102/82  Pulse:  100 92 92  Temp:  99.2 F (37.3 C)    TempSrc:  Oral    Resp:      Height:      Weight: 86.7 kg (191 lb 2.2 oz)     SpO2:  97%      Wt Readings from Last 3 Encounters:  03/23/15 86.7 kg (191 lb 2.2 oz)  02/19/15 82.1 kg (181 lb)  09/19/14 92.3 kg (203 lb 7.8 oz)     Intake/Output Summary (Last 24 hours) at 03/23/15 1131 Last data filed at 03/23/15 1016  Gross per 24 hour  Intake    945 ml  Output    200 ml  Net    745 ml    Exam  General: Well developed, well nourished, NAD  HEENT: NCAT,  mucous membranes moist.   Cardiovascular: S1 S2 auscultated, RRR, no mumurs  Respiratory: Clear to auscultation bilaterally with equal chest rise  Abdomen: Soft, nontender, distended, + bowel sounds  Extremities: warm  dry without cyanosis clubbing. 2+  edema in LE B/L  Neuro: AAOx3, nonfocal  Data Review   Micro Results Recent Results (from the past 240 hour(s))  Culture, body fluid-bottle     Status: None (Preliminary result)   Collection Time: 03/21/15 12:33 PM  Result Value Ref Range Status   Specimen Description FLUID ABDOMEN PERITONEAL  Final   Special Requests BAA 10CCS  Final   Culture NO GROWTH < 24 HOURS  Final   Report Status PENDING  Incomplete  Gram stain     Status: None   Collection Time: 03/21/15 12:33 PM  Result Value Ref Range Status   Specimen Description FLUID ABDOMEN PERITONEAL  Final   Special Requests NONE  Final  Gram Stain   Final    MODERATE WBC PRESENT, PREDOMINANTLY MONONUCLEAR NO ORGANISMS SEEN    Report Status 03/21/2015 FINAL  Final  Urine culture     Status: None   Collection Time: 03/21/15  2:49 PM  Result Value Ref Range Status   Specimen Description URINE, RANDOM  Final   Special Requests NONE  Final   Culture NO GROWTH 1 DAY  Final   Report Status 03/22/2015 FINAL  Final    Radiology Reports Dg Chest 2 View  03/21/2015   CLINICAL DATA:  Chest pain, cough.  EXAM: CHEST  2 VIEW  COMPARISON:  February 12, 2015.  FINDINGS: The heart size and mediastinal contours are within normal limits. No pneumothorax or significant pleural effusion is noted right lung is clear. Grossly stable linear densities are noted in left lung base most consistent with scarring or subsegmental atelectasis. The visualized skeletal structures are unremarkable.  IMPRESSION: Grossly stable left basilar linear densities are noted most consistent with scarring or subsegmental atelectasis. No significant changes noted compared to prior exam.   Electronically Signed   By: Lupita Raider, M.D.   On: 03/21/2015 09:45   US Paracentesis  03/21/2015   INDICATION: ascites  EXAM: ULTRASOUND-GUIDED PARACENTESIS  COMPARISON:  Previous paracentesis  MEDICATIONS: 10 cc 1% lidocaine  COMPLICATIONS: None  immediate  TECHNIQUE: Informed written consent was obtained from the patient after a discussion of the risks, benefits and alternatives to treatment. A timeout was performed prior to the initiation of the procedure.  Initial ultrasound scanning demonstrates a large amount of ascites within the right lower abdominal quadrant. The right lower abdomen was prepped and draped in the usual sterile fashion. 1% lidocaine with epinephrine was used for local anesthesia. Under direct ultrasound guidance, a 19 gauge, 7-cm, Yueh catheter was introduced. An ultrasound image was saved for documentation purposed. The paracentesis was performed. The catheter was removed and a dressing was applied. The patient tolerated the procedure well without immediate post procedural complication.  FINDINGS: A total of approximately 7 liters of cloudy yellow fluid was removed. Samples were sent to the laboratory as requested by the clinical team.  IMPRESSION: Successful ultrasound-guided paracentesis yielding 7 liters of peritoneal fluid.  Read by:  Robet Leu [facility]   Electronically Signed   By: Irish Lack M.D.   On: 03/21/2015 14:39    CBC  Recent Labs Lab 03/21/15 0901 03/22/15 0758 03/23/15 0658  WBC 8.9 6.9 7.6  HGB 13.4 12.6* 12.9*  HCT 41.3 38.2* 39.3  PLT 296 277 260  MCV 68.6* 67.6* 68.0*  MCH 22.3* 22.3* 22.3*  MCHC 32.4 33.0 32.8  RDW 20.3* 20.0* 20.2*    Chemistries   Recent Labs Lab 03/21/15 0901 03/21/15 0959 03/21/15 1445 03/22/15 0758 03/23/15 0658  NA 134*  --   --  133* 131*  K 4.3  --   --  3.7 4.3  CL 98*  --   --  100* 98*  CO2 29  --   --  29 27  GLUCOSE 140*  --   --  122* 196*  BUN 6  --   --  5* 5*  CREATININE 0.95  --   --  0.84 0.92  CALCIUM 8.8*  --   --  8.5* 8.3*  MG  --   --  1.5*  --   --   AST  --  45*  --  33 35  ALT  --  20  --  17 20  ALKPHOS  --  138*  --  98 89  BILITOT  --  1.1  --  1.5* 1.3*    ------------------------------------------------------------------------------------------------------------------ estimated creatinine clearance is 88.6 mL/min (by C-G formula based on Cr of 0.92). ------------------------------------------------------------------------------------------------------------------  Recent Labs  03/21/15 0901  HGBA1C 8.4*   ------------------------------------------------------------------------------------------------------------------ No results for input(s): CHOL, HDL, LDLCALC, TRIG, CHOLHDL, LDLDIRECT in the last 72 hours. ------------------------------------------------------------------------------------------------------------------ No results for input(s): TSH, T4TOTAL, T3FREE, THYROIDAB in the last 72 hours.  Invalid input(s): FREET3 ------------------------------------------------------------------------------------------------------------------ No results for input(s): VITAMINB12, FOLATE, FERRITIN, TIBC, IRON, RETICCTPCT in the last 72 hours.  Coagulation profile  Recent Labs Lab 03/21/15 0959 03/22/15 0758 03/22/15 1410  INR 1.35 1.50* 1.49    No results for input(s): DDIMER in the last 72 hours.  Cardiac Enzymes No results for input(s): CKMB, TROPONINI, MYOGLOBIN in the last 168 hours.  Invalid input(s): CK ------------------------------------------------------------------------------------------------------------------ Invalid input(s): POCBNP    Jarred Purtee D.O. on 03/23/2015 at 11:31 AM  Between 7am to 7pm - Pager - [phone]  After 7pm go to [email or web address] - password [email or web address]  And look for the night coverage person covering for me after hours  [facility] Office  [phone]

## 2015-03-23 NOTE — Procedures (Signed)
Successful US guided paracentesis from LLQ.  Yielded 4.5 liters of serous fluid.  No immediate complications.  Pt tolerated well.   Specimen was not sent for labs.  Pattricia Boss D PA-C 03/23/2015 12:08 PM

## 2015-03-23 NOTE — Progress Notes (Signed)
Occupational Therapy Treatment Patient Details Name: Curtis Estrada MRN: [id number] DOB: 12-12-1955 Today's Date: 03/23/2015    History of present illness With history of decompensated cirrhosis secondary to hepatitis C and alcohol abuse with portal hypertension, coagulopathy, history of alcohol abuse, diabetes mellitus, constipation was recently hospitalized from 02/12/2015 to 02/19/2015 for decompensated liver cirrhosis with large volume ascites with associated hepatic encephalopathy and dyspnea. Patient presents to the ED with a five-day history of worsening abdominal bloating with abdominal tightness and pain radiating to his back, abdominal discomfort, chronic generalized weakness, productive cough of clear sputum with some scant blood, lower extremity edema to his knees. Patient denies any fevers, no chills, no nausea, no vomiting, no diarrhea, no constipation, no dysuria, no melanotic, no hematemesis, no hematochezia. Patient denies any chest pain. Patient with some occasional shortness of breath. Patient stated that he quit drinking Alcohol since his last hospitalization in August 2016.   OT comments  Patient progressing towards OT goals, continue plan of care for now. Pt overall supervision>mod I with mobility and ADLs. Administered sock aid to increase independence with LB ADLs.    Follow Up Recommendations  No OT follow up;Supervision - Intermittent    Equipment Recommendations  Tub/shower bench    Recommendations for Other Services  None at this time   Precautions / Restrictions Precautions Precautions: Fall Restrictions Weight Bearing Restrictions: No    Mobility Bed Mobility Overal bed mobility: Independent General bed mobility comments: Pt found up in BR upon OT entering room and seated EOB upon OT exiting room  Transfers Overall transfer level: Modified independent Equipment used: None Transfers: Sit to/from Stand Sit to Stand: Modified independent (Device/Increase  time) General transfer comment: Pt mod I for basic transfers - no cues needed.    Balance Overall balance assessment: Needs assistance Sitting-balance support: No upper extremity supported;Feet supported Sitting balance-Leahy Scale: Normal     Standing balance support: No upper extremity supported;During functional activity Standing balance-Leahy Scale: Good   ADL Overall ADL's : Needs assistance/impaired General ADL Comments: Pt found in BR upon OT entering room. Therapist adminsterred sock aid and patient able to return demonstrate use of this device. Reiterated education on TTB in tub/shower combo. Also reiterated use of reacher, LH sponge, and LH shoe horn to increase LB ADL independence. Pt states his sisters assist with grocery shopping and that he has an aide 5X per week that assists with other IADLs.      Cognition   Behavior During Therapy: WFL for tasks assessed/performed Overall Cognitive Status: Within Functional Limits for tasks assessed                 Pertinent Vitals/ Pain       Pain Assessment: No/denies pain Pain Score: 2  Pain Location: back pain Pain Descriptors / Indicators: Aching Pain Intervention(s): Monitored during session;Repositioned   Frequency Min 2X/week     Progress Toward Goals  OT Goals(current goals can now befound in the care plan section)  Progress towards OT goals: Progressing toward goals  Acute Rehab OT Goals Patient Stated Goal: get some fluid off my belly  Plan Discharge plan remains appropriate    End of Session     Activity Tolerance Patient tolerated treatment well   Patient Left in bed;with call bell/phone within reach (seated EOB)    Time: 4098-1191 OT Time Calculation (min): 12 min  Charges: OT General Charges $OT Visit: 1 Procedure OT Treatments $Self Care/Home Management : 8-22 mins  Yacine Garriga , MS, OTR/L, CLT  Pager: [phone]  03/23/2015, 1:29 PM

## 2015-03-23 NOTE — Care Management Note (Signed)
Case Management Note  Patient Details  Name: Curtis Estrada MRN: [id number] Date of Birth: 10/10/1955  Subjective/Objective:     NCM received a call from [facility] this [facility] , she states she will call me back with information about getting approval for  hhpt and tub bench for this patient, NCM has not received a call from her.  NCM called her twice and called her pager, did not get an answer.  If patient goes home will just have to get this set up next week when get approval from [place].                 Action/Plan:   Expected Discharge Date:                  Expected Discharge Plan:  Home w Home Health Services  In-House Referral:     Discharge planning Services  CM Consult  Post Acute Care Choice:  Home Health Choice offered to:  Patient  DME Arranged:  Tub bench DME Agency:  [facility]  HH Arranged:  PT [facility] Agency:  [facility]  Status of Service:  In process, will continue to follow  Medicare Important Message Given:    Date Medicare IM Given:    Medicare IM give by:    Date Additional Medicare IM Given:    Additional Medicare Important Message give by:     If discussed at Long Length of Stay Meetings, dates discussed:    Additional Comments:  Leone Haven, RN 03/23/2015, 4:36 PM

## 2015-03-23 NOTE — Progress Notes (Signed)
Physical Therapy Treatment Patient Details Name: Curtis Estrada MRN: [id number] DOB: 09-20-1955 Today's Date: 03/23/2015    History of Present Illness With history of decompensated cirrhosis secondary to hepatitis C and alcohol abuse with portal hypertension, coagulopathy, history of alcohol abuse, diabetes mellitus, constipation was recently hospitalized from 02/12/2015 to 02/19/2015 for decompensated liver cirrhosis with large volume ascites with associated hepatic encephalopathy and dyspnea. Patient presents to the ED with a five-day history of worsening abdominal bloating with abdominal tightness and pain radiating to his back, abdominal discomfort, chronic generalized weakness, productive cough of clear sputum with some scant blood, lower extremity edema to his knees. Patient denies any fevers, no chills, no nausea, no vomiting, no diarrhea, no constipation, no dysuria, no melanotic, no hematemesis, no hematochezia. Patient denies any chest pain. Patient with some occasional shortness of breath. Patient stated that he quit drinking Alcohol since his last hospitalization in August 2016.    PT Comments    Pt making excellent progress with mobility at overall S approaching mod I level without AD. Introduced HEP for [facility] [facility] exercises to address strength and balance; see details below. Educated on importance of continued mobility and OOB to continue to demonstrate improvement to be prepared for d/c home without assist. Continue PT POC to increase functional independence.    Follow Up Recommendations  Home health PT;Supervision - Intermittent     Equipment Recommendations  None recommended by PT    Recommendations for Other Services       Precautions / Restrictions Precautions Precautions: Fall Restrictions Weight Bearing Restrictions: No    Mobility  Bed Mobility Overal bed mobility: Independent                Transfers Overall transfer level: Modified independent Equipment  used: None Transfers: Sit to/from Stand Sit to Stand: Modified independent (Device/Increase time)         General transfer comment: Pt mod I for basic transfers - no cues needed.  Ambulation/Gait Ambulation/Gait assistance: Supervision Ambulation Distance (Feet): 200 Feet Assistive device: None Gait Pattern/deviations: Wide base of support;Decreased stride length;Step-through pattern     General Gait Details: Pt overall S level during gait training on unit with direction changes and no LOB noted. Pt with somewhat of a waddling gait pattern but reports thiis is his normal.    Information systems manager [place] (Stroke Patients Only)       Balance   Sitting-balance support: No upper extremity supported;Feet supported Sitting balance-Leahy Scale: Normal     Standing balance support: No upper extremity supported;During functional activity Standing balance-Leahy Scale: Good                      Cognition Arousal/Alertness: Awake/alert Behavior During Therapy: WFL for tasks assessed/performed Overall Cognitive Status: Within Functional Limits for tasks assessed                      Exercises General Exercises - Lower Extremity Hip ABduction/ADduction: Strengthening;Both;10 reps;Standing Toe Raises: Strengthening;Both;10 reps;Standing Heel Raises: Strengthening;Both;10 reps;Standing Mini-Sqauts: Strengthening;10 reps;Standing    General Comments General comments (skin integrity, edema, etc.): increased abdominal distention      Pertinent Vitals/Pain Pain Score: 2  Pain Location: back pain Pain Descriptors / Indicators: Aching Pain Intervention(s): Monitored during session;Repositioned    Home Living  Prior Function            PT Goals (current goals can now be found in the care plan section) Acute Rehab PT Goals Patient Stated Goal: get some fluid off my belly PT Goal Formulation:  With patient Time For Goal Achievement: 04/04/15 Potential to Achieve Goals: Good Progress towards PT goals: Progressing toward goals    Frequency  Min 3X/week    PT Plan Current plan remains appropriate    Co-evaluation             End of Session   Activity Tolerance: Patient tolerated treatment well Patient left: in chair;with call bell/phone within reach     Time: 0942-0956 PT Time Calculation (min) (ACUTE ONLY): 14 min  Charges:  $Therapeutic Exercise: 8-22 mins                    G Codes:      Karolee Stamps Darrol Poke, PT, DPT Pager #: [phone]  03/23/2015, 10:09 AM

## 2015-03-23 NOTE — Progress Notes (Signed)
   03/23/15 1700  Clinical Encounter Type  Visited With Patient  Visit Type Follow-up  Referral From Patient;Nurse    Pt. Has AD via the [place].

## 2015-03-24 LAB — CBC
HCT: 39.2 % (ref 39.0–52.0)
Hemoglobin: 13 g/dL (ref 13.0–17.0)
MCH: 22.6 pg — ABNORMAL LOW (ref 26.0–34.0)
MCHC: 33.2 g/dL (ref 30.0–36.0)
MCV: 68.2 fL — AB (ref 78.0–100.0)
PLATELETS: 253 10*3/uL (ref 150–400)
RBC: 5.75 MIL/uL (ref 4.22–5.81)
RDW: 20.2 % — AB (ref 11.5–15.5)
WBC: 7.7 10*3/uL (ref 4.0–10.5)

## 2015-03-24 LAB — BASIC METABOLIC PANEL
ANION GAP: 6 (ref 5–15)
BUN: <5 mg/dL — ABNORMAL LOW (ref 6–20)
CALCIUM: 8.2 mg/dL — AB (ref 8.9–10.3)
CO2: 27 mmol/L (ref 22–32)
Chloride: 100 mmol/L — ABNORMAL LOW (ref 101–111)
Creatinine, Ser: 0.71 mg/dL (ref 0.61–1.24)
GFR calc Af Amer: >60 mL/min (ref >60)
GLUCOSE: 135 mg/dL — AB (ref 65–99)
Potassium: 4.4 mmol/L (ref 3.5–5.1)
SODIUM: 133 mmol/L — AB (ref 135–145)

## 2015-03-24 LAB — GLUCOSE, CAPILLARY
GLUCOSE-CAPILLARY: 170 mg/dL — AB (ref 65–99)
Glucose-Capillary: 195 mg/dL — ABNORMAL HIGH (ref 65–99)
Glucose-Capillary: 152 mg/dL — ABNORMAL HIGH (ref 65–99)

## 2015-03-24 MED ORDER — LACTULOSE 10 GM/15ML PO SOLN: 10.0000 g | Freq: Two times a day (BID) | ORAL | Status: DC

## 2015-03-24 MED ORDER — RIFAXIMIN 550 MG PO TABS: 550.0000 mg | ORAL_TABLET | Freq: Two times a day (BID) | ORAL | Status: AC

## 2015-03-24 MED ORDER — SPIRONOLACTONE 50 MG PO TABS: 50.0000 mg | ORAL_TABLET | Freq: Every day | ORAL | Status: DC

## 2015-03-24 MED ORDER — MAGNESIUM SULFATE 2 GM/50ML IV SOLN
2.0000 g | Freq: Once | INTRAVENOUS | Status: AC
Start: 2015-03-24 — End: 2015-03-24
  Administered 2015-03-24: 2 g via INTRAVENOUS
  Filled 2015-03-24: qty 50

## 2015-03-24 NOTE — Discharge Summary (Signed)
Physician Discharge Summary  Curtis Estrada ZOX:[id number] DOB: [birth year] DOA: 03/21/2015  PCP: [facility]  Admit date: 03/21/2015 Discharge date: 03/24/2015  Time spent: 45 minutes  Recommendations for Outpatient Follow-up:  Patient will be discharged to home with home health PT.  Patient will need to follow up with primary care provider within one week of discharge.  Patient will also need to establish care with a hepatologist/GI (spoke with sister, will take him to [facility] for referral).  Patient should continue medications as prescribed.  Patient should follow a  heart health/carb modified with fluid restriction per day.  Discharge Diagnoses:  Principal Problem:   Decompensated hepatic cirrhosis Active Problems:   Cirrhosis, alcoholic/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   Hepatitis C   Ascites   Essential hypertension, benign   Uncontrolled type 2 diabetes mellitus   Microcytic anemia   Alcohol dependence   Tobacco abuse   Lower extremity edema   Bilateral edema of lower extremity  Discharge Condition: Stable  Diet recommendation: heart health/carb modified with fluid restriction per day  Filed Weights   03/23/15 0500 03/24/15 0623 03/24/15 0700  Weight: 86.7 kg (191 lb 2.2 oz) 82.2 kg (181 lb 3.5 oz) 82.3 kg (181 lb 7 oz)    History of present illness:  on 03/21/2015 by Dr. Ramiro Harvest Rad Gramling is a 59 y.o. male with history of decompensated cirrhosis secondary to hepatitis C and alcohol abuse with portal hypertension, coagulopathy, history of alcohol abuse, diabetes mellitus, constipation was recently hospitalized from 02/12/2015 to 02/19/2015 for decompensated liver cirrhosis with large volume ascites with associated hepatic encephalopathy and dyspnea. Patient presents to the ED with a five-day history of worsening abdominal bloating with abdominal tightness and pain radiating to his back, abdominal discomfort, chronic generalized weakness, productive cough of clear  sputum with some scant blood, lower extremity edema to his knees. Patient denies any fevers, no chills, no nausea, no vomiting, no diarrhea, no constipation, no dysuria, no melanotic, no hematemesis, no hematochezia. Patient denies any chest pain. Patient with some occasional shortness of breath. Patient stated that he quit drinking Alcohol since his last hospitalization in August 2016. Patient was seen in the emergency room, basic metabolic profile done was unremarkable. Hepatic profile done at out phosphatase of 138 hour lumen of 2.2 AST of 45 ALT of 20 otherwise was within normal limits. INR was 1.35. CBC unremarkable. Chest x-ray with no significant changes compared to prior chest x-ray. [facility] were called to admit the patient for further evaluation and management.  Hospital Course:  Decompensated hepatic cirrhosis/recurrent ascites -Patient has had multiple admissions for abdominal pain and distention -Interventional radiology consultation appreciated -S/p paracentesis on 03/21/2015, 7 L of fluid removal, 03/23/2015 4.5L removed -Fluid labs not consistent with SBP, therefore will hold off on antibiotics -Patient recently admitted and discharged in August 2016, with levofloxacin and Flagyl -Currently LFTs WNL, INR 1.49 -Continue spironolactone, rifaximin, propranolol, lactulose, Lasix -Patient would benefit from outpatient gastroenterology follow-up- Spoke with GI, may increase diuretics.  -Fluid culture shows, no growth to date, Gram stain shows no organisms -At this time, patient does not have renal or hepatic failure, and does not meet guidelines for prophylactic treatment  -Spoke with sister regarding GI/hepatology follow up.  She will take him to the [place].  Hypertension -Continue Lasix, spironolactone, propranolol  Microcytic anemia -Hemoglobin currently 13, currently stable  History of alcohol dependence -Patient states he stopped drinking approximately one month ago, but  admits to drinking in August -Was placed  on CIWA protocol -Advised patient to stop drinking alcohol  Bilateral lower extremity edema -Likely secondary to the above process -Continue diuretics  Tobacco abuse -Smoking cessation discussed -Continue nicotine patch  Diabetes mellitus, type II -Continue Lantus, insulin sliding scale, CBG monitoring -Hemoglobin A1c 8.4  Physical Deconditioning -PT consulted, recommended HH  Procedures  US guided paracentesis  Consults  Interventional radiology  Gastroenterology, via phone  Discharge Exam: Filed Vitals:   03/24/15 0625  BP: 94/66  Pulse: 94  Temp: 98.1 F (36.7 C)  Resp: 16   Exam  General: Well developed, well nourished, NAD  HEENT: NCAT, mucous membranes moist.   Cardiovascular: S1 S2 auscultated, RRR, no mumurs  Respiratory: Clear to auscultation bilaterally with equal chest rise  Abdomen: Soft, nontender, nondistended, + bowel sounds  Extremities: warm dry without cyanosis clubbing. 2+ edema in LE B/L  Neuro: AAOx3, nonfocal  Discharge Instructions      Discharge Instructions    Discharge instructions    Complete by:  As directed   Patient will be discharged to home with home health PT.  Patient will need to follow up with primary care provider within one week of discharge.  Patient will also need to establish care with a hepatologist/GI (spoke with sister, will take him to [facility] for referral).  Patient should continue medications as prescribed.  Patient should follow a  heart health/carb modified with fluid restriction per day.            Medication List    TAKE these medications        albuterol 108 (90 BASE) MCG/ACT inhaler  Commonly known as:  PROVENTIL HFA;VENTOLIN HFA  Inhale 2 puffs into the lungs every 2 (two) hours as needed for wheezing or shortness of breath (or coughing).     folic acid 1 MG tablet  Commonly known as:  FOLVITE  Take 1 tablet (1 mg total) by mouth daily.      furosemide 80 MG tablet  Commonly known as:  LASIX  Take 1 tablet (80 mg total) by mouth 2 (two) times daily.     gabapentin 300 MG capsule  Commonly known as:  NEURONTIN  Take 600 mg by mouth 3 (three) times daily.     insulin aspart 100 UNIT/ML injection  Commonly known as:  novoLOG  Inject 0-6 Units into the skin 3 (three) times daily with meals. 0-6 Units, Subcutaneous, 3 times daily with meals  CBG < 70: implement hypoglycemia protocol; CBG 70 - 120: 0 units; CBG 121 - 150: 1 unit; CBG 151 - 200: 2 units; CBG 201 - 250: 3 units; CBG 251 - 300: 5 units; CBG 301 - 350: 7 units; CBG 351 - 400: 9 units; CBG > 400: call MD     insulin detemir 100 UNIT/ML injection  Commonly known as:  LEVEMIR  Inject 0.18 mLs (18 Units total) into the skin at bedtime.     lactulose 10 GM/15ML solution  Commonly known as:  CHRONULAC  Take 15 mLs (10 g total) by mouth 2 (two) times daily.     Magnesium Oxide 420 MG Tabs  Take 1 tablet by mouth daily.     multivitamin with minerals Tabs tablet  Take 1 tablet by mouth daily.     pantoprazole 40 MG tablet  Commonly known as:  PROTONIX  Take 40 mg by mouth 2 (two) times daily.     polyvinyl alcohol 1.4 % ophthalmic solution  Commonly known as:  LIQUIFILM TEARS  Place 1 drop into both eyes daily as needed (dry eyes).     propranolol 10 MG tablet  Commonly known as:  INDERAL  Take 1 tablet (10 mg total) by mouth 2 (two) times daily.     rifaximin 550 MG Tabs tablet  Commonly known as:  XIFAXAN  Take 1 tablet (550 mg total) by mouth 2 (two) times daily.     spironolactone 50 MG tablet  Commonly known as:  ALDACTONE  Take 1 tablet (50 mg total) by mouth daily.       Allergies  Allergen Reactions  . Penicillins     Childhood Has received Rocephin w/o reaction   Follow-up Information    Follow up with [facility]. Schedule an appointment as soon as possible for a visit in 1 week.   Specialty:  General Practice   Why:  Hospital  followup, hepatology/GI referral   Contact information:   [address] [place] [address] [phone]        The results of significant diagnostics from this hospitalization (including imaging, microbiology, ancillary and laboratory) are listed below for reference.    Significant Diagnostic Studies: Dg Chest 2 View  03/21/2015   CLINICAL DATA:  Chest pain, cough.  EXAM: CHEST  2 VIEW  COMPARISON:  February 12, 2015.  FINDINGS: The heart size and mediastinal contours are within normal limits. No pneumothorax or significant pleural effusion is noted right lung is clear. Grossly stable linear densities are noted in left lung base most consistent with scarring or subsegmental atelectasis. The visualized skeletal structures are unremarkable.  IMPRESSION: Grossly stable left basilar linear densities are noted most consistent with scarring or subsegmental atelectasis. No significant changes noted compared to prior exam.   Electronically Signed   By: Lupita Raider, M.D.   On: 03/21/2015 09:45   US Paracentesis  03/23/2015   INDICATION: Hepatitis C, cirrhosis, recurrent ascites and request for paracentesis.  EXAM: ULTRASOUND-GUIDED PARACENTESIS  COMPARISON:  Paracentesis 03/21/15.  MEDICATIONS: None.  COMPLICATIONS: None immediate  TECHNIQUE: Informed written consent was obtained from the patient after a discussion of the risks, benefits and alternatives to treatment. A timeout was performed prior to the initiation of the procedure.  Initial ultrasound scanning demonstrates a large amount of ascites within the left lower abdominal quadrant. The left lower abdomen was prepped and draped in the usual sterile fashion. 1% lidocaine with epinephrine was used for local anesthesia.  Under direct ultrasound guidance, a 19 gauge, 10-cm, Yueh catheter was introduced. An ultrasound image was saved for documentation purposed. The paracentesis was performed. The catheter was removed and a dressing was applied. The  patient tolerated the procedure well without immediate post procedural complication.  FINDINGS: A total of approximately 4.5 liters of serous fluid was removed.  IMPRESSION: Successful ultrasound-guided paracentesis yielding 4.5 liters of peritoneal fluid.  Read By:  Pattricia Boss PA-C   Electronically Signed   By: Gilmer Mor D.O.   On: 03/23/2015 12:12   US Paracentesis  03/21/2015   INDICATION: ascites  EXAM: ULTRASOUND-GUIDED PARACENTESIS  COMPARISON:  Previous paracentesis  MEDICATIONS: 10 cc 1% lidocaine  COMPLICATIONS: None immediate  TECHNIQUE: Informed written consent was obtained from the patient after a discussion of the risks, benefits and alternatives to treatment. A timeout was performed prior to the initiation of the procedure.  Initial ultrasound scanning demonstrates a large amount of ascites within the right lower abdominal quadrant. The right lower abdomen was prepped and draped in the usual sterile  fashion. 1% lidocaine with epinephrine was used for local anesthesia. Under direct ultrasound guidance, a 19 gauge, 7-cm, Yueh catheter was introduced. An ultrasound image was saved for documentation purposed. The paracentesis was performed. The catheter was removed and a dressing was applied. The patient tolerated the procedure well without immediate post procedural complication.  FINDINGS: A total of approximately 7 liters of cloudy yellow fluid was removed. Samples were sent to the laboratory as requested by the clinical team.  IMPRESSION: Successful ultrasound-guided paracentesis yielding 7 liters of peritoneal fluid.  Read by:  Robet Leu [facility]   Electronically Signed   By: Irish Lack M.D.   On: 03/21/2015 14:39    Microbiology: Recent Results (from the past 240 hour(s))  Culture, body fluid-bottle     Status: None (Preliminary result)   Collection Time: 03/21/15 12:33 PM  Result Value Ref Range Status   Specimen Description FLUID ABDOMEN PERITONEAL  Final   Special Requests  BAA 10CCS  Final   Culture NO GROWTH 2 DAYS  Final   Report Status PENDING  Incomplete  Gram stain     Status: None   Collection Time: 03/21/15 12:33 PM  Result Value Ref Range Status   Specimen Description FLUID ABDOMEN PERITONEAL  Final   Special Requests NONE  Final   Gram Stain   Final    MODERATE WBC PRESENT, PREDOMINANTLY MONONUCLEAR NO ORGANISMS SEEN    Report Status 03/21/2015 FINAL  Final  Urine culture     Status: None   Collection Time: 03/21/15  2:49 PM  Result Value Ref Range Status   Specimen Description URINE, RANDOM  Final   Special Requests NONE  Final   Culture NO GROWTH 1 DAY  Final   Report Status 03/22/2015 FINAL  Final     Labs: Basic Metabolic Panel:  Recent Labs Lab 03/21/15 0901 03/21/15 1445 03/22/15 0758 03/23/15 0658 03/24/15 0444  NA 134*  --  133* 131* 133*  K 4.3  --  3.7 4.3 4.4  CL 98*  --  100* 98* 100*  CO2 29  --  29 27 27   GLUCOSE 140*  --  122* 196* 135*  BUN 6  --  5* 5* <5*  CREATININE 0.95  --  0.84 0.92 0.71  CALCIUM 8.8*  --  8.5* 8.3* 8.2*  MG  --  1.5*  --   --   --    Liver Function Tests:  Recent Labs Lab 03/21/15 0959 03/22/15 0758 03/23/15 0658  AST 45* 33 35  ALT 20 17 20   ALKPHOS 138* 98 89  BILITOT 1.1 1.5* 1.3*  PROT 7.0 6.0* 6.0*  ALBUMIN 2.2* 2.3* 2.0*    Recent Labs Lab 03/21/15 1445  LIPASE 33   No results for input(s): AMMONIA in the last 168 hours. CBC:  Recent Labs Lab 03/21/15 0901 03/22/15 0758 03/23/15 0658 03/24/15 0444  WBC 8.9 6.9 7.6 7.7  HGB 13.4 12.6* 12.9* 13.0  HCT 41.3 38.2* 39.3 39.2  MCV 68.6* 67.6* 68.0* 68.2*  PLT 296 277 260 253   Cardiac Enzymes: No results for input(s): CKTOTAL, CKMB, CKMBINDEX, TROPONINI in the last 168 hours. BNP: BNP (last 3 results)  Recent Labs  09/19/14 1920 02/12/15 0907  BNP 29.8 19.0    ProBNP (last 3 results) No results for input(s): PROBNP in the last 8760 hours.  CBG:  Recent Labs Lab 03/23/15 0757 03/23/15 1215  03/23/15 1703 03/23/15 2217 03/24/15 0732  GLUCAP 203* 160* 136* 195* 152*  SignedEdsel Petrin  [facility] 03/24/2015, 9:33 AM

## 2015-03-24 NOTE — Care Management (Signed)
CM spoke with patient at the bedside. [facility] and approval for HHPT and DME was not received on yesterday. Patient reports he received a call from [facility] and his medications are over $900. CM assisted patient will calling the pharmacy again, was told his medications are $30 for 2 prescriptions they have available. He plans to have [facility] transfer his prescriptions to the [place] and pick up the prescriptions on Monday. Physicist, medical BSN CCM

## 2015-03-24 NOTE — Discharge Instructions (Signed)
Ascites °Ascites is a gathering of fluid in the belly (abdomen). This is most often caused by liver disease. It may also be caused by a number of other less common problems. It causes a ballooning out (distension) of the abdomen. °CAUSES  °Scarring of the liver (cirrhosis) is the most common cause of ascites. Other causes include: °· Infection or inflammation in the abdomen. °· Cancer in the abdomen. °· Heart failure. °· Certain forms of kidney failure (nephritic syndrome). °· Inflammation of the pancreas. °· Clots in the veins of the liver. °SYMPTOMS  °In the early stages of ascites, you may not have any symptoms. The main symptom of ascites is a sense of abdominal bloating. This is due to the presence of fluid. This may also cause an increase in abdominal or waist size. People with this condition can develop swelling in the legs, and men can develop a swollen scrotum. When there is a lot of fluid, it may be hard to breath. Stretching of the abdomen by fluid can be painful. °DIAGNOSIS  °Certain features of your medical history, such as a history of liver disease and of an enlarging abdomen, can suggest the presence of ascites. The diagnosis of ascites can be made on physical exam by your caregiver. An abdominal ultrasound examination can confirm that ascites is present, and estimate the amount of fluid. °Once ascites is confirmed, it is important to determine its cause. Again, a history of one of the conditions listed in "CAUSES" provides a strong clue. A physical exam is important, and blood and X-ray tests may be needed. During a procedure called paracentesis, a sample of fluid is removed from the abdomen. This can determine certain key features about the fluid, such as whether or not infection or cancer is present. Your caregiver will determine if a paracentesis is necessary. They will describe the procedure to you. °PREVENTION  °Ascites is a complication of other conditions. Therefore to prevent ascites, you  must seek treatment for any significant health conditions you have. Once ascites is present, careful attention to fluid and salt intake may help prevent it from getting worse. If you have ascites, you should not drink alcohol. °PROGNOSIS  °The prognosis of ascites depends on the underlying disease. If the disease is reversible, such as with certain infections or with heart failure, then ascites may improve or disappear. When ascites is caused by cirrhosis, then it indicates that the liver disease has worsened, and further evaluation and treatment of the liver disease is needed. If your ascites is caused by cancer, then the success or failure of the cancer treatment will determine whether your ascites will improve or worsen. °RISKS AND COMPLICATIONS  °Ascites is likely to worsen if it is not properly diagnosed and treated. A large amount of ascites can cause pain and difficulty breathing. The main complication, besides worsening, is infection (called spontaneous bacterial peritonitis). This requires prompt treatment. °TREATMENT  °The treatment of ascites depends on its cause. When liver disease is your cause, medical management using water pills (diuretics) and decreasing salt intake is often effective. Ascites due to peritoneal inflammation or malignancy (cancer) alone does not respond to salt restriction and diuretics. Hospitalization is sometimes required. °If the treatment of ascites cannot be managed with medications, a number of other treatments are available. Your caregivers will help you decide which will work best for you. Some of these are: °· Removal of fluid from the abdomen (paracentesis). °· Fluid from the abdomen is passed into a vein (peritoneovenous shunting). °·   Liver transplantation. °· Transjugular intrahepatic portosystemic stent shunt. °HOME CARE INSTRUCTIONS  °It is important to monitor body weight and the intake and output of fluids. Weigh yourself at the same time every day. Record your  weights. Fluid restriction may be necessary. It is also important to know your salt intake. The more salt you take in, the more fluid you will retain. Ninety percent of people with ascites respond to this approach. °· Follow any directions for medicines carefully. °· Follow up with your caregiver, as directed. °· Report any changes in your health, especially any new or worsening symptoms. °· If your ascites is from liver disease, avoid alcohol and other substances toxic to the liver. °SEEK MEDICAL CARE IF:  °· Your weight increases more than a few pounds in a few days. °· Your abdominal or waist size increases. °· You develop swelling in your legs. °· You had swelling and it worsens. °SEEK IMMEDIATE MEDICAL CARE IF:  °· You develop a fever. °· You develop new abdominal pain. °· You develop difficulty breathing. °· You develop confusion. °· You have bleeding from the mouth, stomach, or rectum. °MAKE SURE YOU:  °· Understand these instructions. °· Will watch your condition. °· Will get help right away if you are not doing well or get worse. °Document Released: 06/30/2005 Document Revised: 09/22/2011 Document Reviewed: 01/29/2007 °ExitCare® Patient Information ©2015 [employer]. This information is not intended to replace advice given to you by your health care provider. Make sure you discuss any questions you have with your health care provider. °Cirrhosis °Cirrhosis is a condition of scarring of the liver which is caused when the liver has tried repairing itself following damage. This damage may come from a previous infection such as one of the forms of hepatitis (usually hepatitis C), or the damage may come from being injured by toxins. The main toxin that causes this damage is alcohol. The scarring of the liver from use of alcohol is irreversible. That means the liver cannot return to normal even though alcohol is not used any more. The main danger of hepatitis C infection is that it may cause long-lasting  (chronic) liver disease, and this also may lead to cirrhosis. This complication is progressive and irreversible. °CAUSES  °Prior to available blood tests, hepatitis C could be contracted by blood transfusions. Since testing of blood has improved, this is now unlikely. This infection can also be contracted through intravenous drug use and the sharing of needles. It can also be contracted through sexual relationships. The injury caused by alcohol comes from too much use. It is not a few drinks that poison the liver, but years of misuse. Usually there will be some signs and symptoms early with scarring of the liver that suggest the development of better habits. Alcohol should never be used while using acetaminophen. A small dose of both taken together may cause irreversible damage to the liver. °HOME CARE INSTRUCTIONS  °There is no specific treatment for cirrhosis. However, there are things you can do to avoid making the condition worse. °· Rest as needed. °· Eat a well-balanced diet. Your caregiver can help you with suggestions. °· Vitamin supplements including vitamins A, K, D, and thiamine can help. °· A low-salt diet, water restriction, or diuretic medicine may be needed to reduce fluid retention. °· Avoid alcohol. This can be extremely toxic if combined with acetaminophen. °· Avoid drugs which are toxic to the liver. Some of these include isoniazid, methyldopa, acetaminophen, anabolic steroids (muscle-building drugs), erythromycin, and   oral contraceptives (birth control pills). Check with your caregiver to make sure medicines you are presently taking will not be harmful. °· Periodic blood tests may be required. Follow your caregiver's advice regarding the timing of these. °· Milk thistle is an herbal remedy which does protect the liver against toxins. However, it will not help once the liver has been scarred. °SEEK MEDICAL CARE IF: °· You have increasing fatigue or weakness. °· You develop swelling of the hands,  feet, legs, or face. °· You vomit bright red blood, or a coffee ground appearing material. °· You have blood in your stools, or the stools turn black and tarry. °· You have a fever. °· You develop loss of appetite, or have nausea and vomiting. °· You develop jaundice. °· You develop easy bruising or bleeding. °· You have worsening of any of the problems you are concerned about. °Document Released: 06/30/2005 Document Revised: 09/22/2011 Document Reviewed: 02/16/2008 °ExitCare® Patient Information ©2015 [employer]. This information is not intended to replace advice given to you by your health care provider. Make sure you discuss any questions you have with your health care provider. ° °

## 2015-03-24 NOTE — Progress Notes (Signed)
03/24/15 Patient will be discharged today, IV site removed and discharge instructions reviewed with patient.

## 2015-03-26 LAB — CULTURE, BODY FLUID W GRAM STAIN -BOTTLE: Culture: NO GROWTH

## 2015-03-26 LAB — CULTURE, BODY FLUID-BOTTLE

## 2015-03-26 NOTE — Progress Notes (Addendum)
NCM received call from Vernona Rieger from the [place] , stating she has authorization for his HHPT with [facility],  NCM gave Vernona Rieger advance home care phone number to call in authorization to.  NCM also called Miranda with [facility] to notify of this authorization for hhpt.  NCM also called and left patient a message that HHPT was approved thru [place] and he will be contacted by [facility].

## 2015-04-03 ENCOUNTER — Encounter (HOSPITAL_COMMUNITY): Payer: Self-pay

## 2015-04-03 ENCOUNTER — Emergency Department (HOSPITAL_COMMUNITY): Payer: Non-veteran care

## 2015-04-03 ENCOUNTER — Inpatient Hospital Stay (HOSPITAL_COMMUNITY)
Admission: EM | Admit: 2015-04-03 | Discharge: 2015-04-06 | DRG: 433 | Disposition: A | Discharge status: Home / Self Care | Payer: Non-veteran care | Attending: Internal Medicine | Admitting: Internal Medicine

## 2015-04-03 DIAGNOSIS — K7469 Other cirrhosis of liver: Secondary | ICD-10-CM

## 2015-04-03 DIAGNOSIS — E1165 Type 2 diabetes mellitus with hyperglycemia: Secondary | ICD-10-CM | POA: Diagnosis present

## 2015-04-03 DIAGNOSIS — F1010 Alcohol abuse, uncomplicated: Secondary | ICD-10-CM | POA: Diagnosis present

## 2015-04-03 DIAGNOSIS — R600 Localized edema: Secondary | ICD-10-CM | POA: Diagnosis present

## 2015-04-03 DIAGNOSIS — E871 Hypo-osmolality and hyponatremia: Secondary | ICD-10-CM | POA: Diagnosis present

## 2015-04-03 DIAGNOSIS — Z818 Family history of other mental and behavioral disorders: Secondary | ICD-10-CM | POA: Diagnosis not present

## 2015-04-03 DIAGNOSIS — K5900 Constipation, unspecified: Secondary | ICD-10-CM | POA: Diagnosis present

## 2015-04-03 DIAGNOSIS — E785 Hyperlipidemia, unspecified: Secondary | ICD-10-CM | POA: Diagnosis present

## 2015-04-03 DIAGNOSIS — Z794 Long term (current) use of insulin: Secondary | ICD-10-CM | POA: Diagnosis not present

## 2015-04-03 DIAGNOSIS — I10 Essential (primary) hypertension: Secondary | ICD-10-CM | POA: Diagnosis present

## 2015-04-03 DIAGNOSIS — I959 Hypotension, unspecified: Secondary | ICD-10-CM | POA: Diagnosis present

## 2015-04-03 DIAGNOSIS — K769 Liver disease, unspecified: Secondary | ICD-10-CM | POA: Diagnosis present

## 2015-04-03 DIAGNOSIS — F17210 Nicotine dependence, cigarettes, uncomplicated: Secondary | ICD-10-CM | POA: Diagnosis present

## 2015-04-03 DIAGNOSIS — M19019 Primary osteoarthritis, unspecified shoulder: Secondary | ICD-10-CM | POA: Diagnosis present

## 2015-04-03 DIAGNOSIS — E46 Unspecified protein-calorie malnutrition: Secondary | ICD-10-CM | POA: Diagnosis present

## 2015-04-03 DIAGNOSIS — K7290 Hepatic failure, unspecified without coma: Secondary | ICD-10-CM | POA: Diagnosis present

## 2015-04-03 DIAGNOSIS — B1920 Unspecified viral hepatitis C without hepatic coma: Secondary | ICD-10-CM | POA: Diagnosis present

## 2015-04-03 DIAGNOSIS — K219 Gastro-esophageal reflux disease without esophagitis: Secondary | ICD-10-CM | POA: Diagnosis present

## 2015-04-03 DIAGNOSIS — R51 Headache: Secondary | ICD-10-CM | POA: Diagnosis present

## 2015-04-03 DIAGNOSIS — J42 Unspecified chronic bronchitis: Secondary | ICD-10-CM | POA: Diagnosis present

## 2015-04-03 DIAGNOSIS — K7030 Alcoholic cirrhosis of liver without ascites: Secondary | ICD-10-CM | POA: Diagnosis present

## 2015-04-03 DIAGNOSIS — B182 Chronic viral hepatitis C: Secondary | ICD-10-CM | POA: Diagnosis not present

## 2015-04-03 DIAGNOSIS — R188 Other ascites: Secondary | ICD-10-CM | POA: Diagnosis present

## 2015-04-03 DIAGNOSIS — I9589 Other hypotension: Secondary | ICD-10-CM | POA: Diagnosis not present

## 2015-04-03 DIAGNOSIS — IMO0002 Reserved for concepts with insufficient information to code with codable children: Secondary | ICD-10-CM | POA: Diagnosis present

## 2015-04-03 DIAGNOSIS — D62 Acute posthemorrhagic anemia: Secondary | ICD-10-CM | POA: Diagnosis present

## 2015-04-03 DIAGNOSIS — K7460 Unspecified cirrhosis of liver: Principal | ICD-10-CM | POA: Diagnosis present

## 2015-04-03 DIAGNOSIS — Z809 Family history of malignant neoplasm, unspecified: Secondary | ICD-10-CM

## 2015-04-03 LAB — COMPREHENSIVE METABOLIC PANEL
ALT: 25 U/L (ref 17–63)
ANION GAP: 8 (ref 5–15)
AST: 48 U/L — ABNORMAL HIGH (ref 15–41)
Albumin: 2 g/dL — ABNORMAL LOW (ref 3.5–5.0)
Alkaline Phosphatase: 175 U/L — ABNORMAL HIGH (ref 38–126)
BUN: 8 mg/dL (ref 6–20)
CHLORIDE: 102 mmol/L (ref 101–111)
CO2: 22 mmol/L (ref 22–32)
CREATININE: 0.93 mg/dL (ref 0.61–1.24)
Calcium: 8.6 mg/dL — ABNORMAL LOW (ref 8.9–10.3)
Glucose, Bld: 307 mg/dL — ABNORMAL HIGH (ref 65–99)
Potassium: 4.1 mmol/L (ref 3.5–5.1)
SODIUM: 132 mmol/L — AB (ref 135–145)
Total Bilirubin: 1 mg/dL (ref 0.3–1.2)
Total Protein: 6.3 g/dL — ABNORMAL LOW (ref 6.5–8.1)

## 2015-04-03 LAB — CBC
HCT: 42.2 % (ref 39.0–52.0)
HEMOGLOBIN: 14 g/dL (ref 13.0–17.0)
MCH: 22.9 pg — ABNORMAL LOW (ref 26.0–34.0)
MCHC: 33.2 g/dL (ref 30.0–36.0)
MCV: 69 fL — AB (ref 78.0–100.0)
PLATELETS: 246 10*3/uL (ref 150–400)
RBC: 6.12 MIL/uL — AB (ref 4.22–5.81)
RDW: 20.9 % — ABNORMAL HIGH (ref 11.5–15.5)
WBC: 7.1 10*3/uL (ref 4.0–10.5)

## 2015-04-03 LAB — LIPASE, BLOOD: LIPASE: 35 U/L (ref 22–51)

## 2015-04-03 LAB — PROTIME-INR
INR: 1.49 (ref 0.00–1.49)
Prothrombin Time: 18.1 seconds — ABNORMAL HIGH (ref 11.6–15.2)

## 2015-04-03 LAB — GLUCOSE, CAPILLARY: GLUCOSE-CAPILLARY: 144 mg/dL — AB (ref 65–99)

## 2015-04-03 MED ORDER — ALBUTEROL SULFATE HFA 108 (90 BASE) MCG/ACT IN AERS: 2.0000 | INHALATION_SPRAY | RESPIRATORY_TRACT | Status: DC | PRN

## 2015-04-03 MED ORDER — MAGNESIUM OXIDE 420 MG PO TABS: 1.0000 | ORAL_TABLET | Freq: Every day | ORAL | Status: DC

## 2015-04-03 MED ORDER — FOLIC ACID 1 MG PO TABS
1.0000 mg | ORAL_TABLET | Freq: Every day | ORAL | Status: DC
  Administered 2015-04-04 – 2015-04-06 (×3): 1 mg via ORAL
  Filled 2015-04-03 (×3): qty 1

## 2015-04-03 MED ORDER — LACTULOSE 10 GM/15ML PO SOLN
10.0000 g | Freq: Two times a day (BID) | ORAL | Status: DC
  Administered 2015-04-03 – 2015-04-06 (×6): 10 g via ORAL
  Filled 2015-04-03 (×6): qty 15

## 2015-04-03 MED ORDER — ALBUTEROL SULFATE (2.5 MG/3ML) 0.083% IN NEBU: 2.5000 mg | INHALATION_SOLUTION | RESPIRATORY_TRACT | Status: DC | PRN

## 2015-04-03 MED ORDER — SODIUM CHLORIDE 0.9 % IJ SOLN
3.0000 mL | Freq: Two times a day (BID) | INTRAMUSCULAR | Status: DC
  Administered 2015-04-03 – 2015-04-06 (×6): 3 mL via INTRAVENOUS

## 2015-04-03 MED ORDER — INSULIN DETEMIR 100 UNIT/ML ~~LOC~~ SOLN
18.0000 [IU] | Freq: Every day | SUBCUTANEOUS | Status: DC
  Administered 2015-04-03 – 2015-04-05 (×3): 18 [IU] via SUBCUTANEOUS
  Filled 2015-04-03 (×4): qty 0.18

## 2015-04-03 MED ORDER — POLYVINYL ALCOHOL 1.4 % OP SOLN
1.0000 [drp] | Freq: Every day | OPHTHALMIC | Status: DC | PRN
  Filled 2015-04-03: qty 15

## 2015-04-03 MED ORDER — PANTOPRAZOLE SODIUM 40 MG PO TBEC
40.0000 mg | DELAYED_RELEASE_TABLET | Freq: Two times a day (BID) | ORAL | Status: DC
  Administered 2015-04-03 – 2015-04-06 (×6): 40 mg via ORAL
  Filled 2015-04-03 (×6): qty 1

## 2015-04-03 MED ORDER — FUROSEMIDE 80 MG PO TABS
80.0000 mg | ORAL_TABLET | Freq: Two times a day (BID) | ORAL | Status: DC
  Administered 2015-04-04 – 2015-04-05 (×4): 80 mg via ORAL
  Filled 2015-04-03 (×5): qty 1

## 2015-04-03 MED ORDER — ONDANSETRON HCL 4 MG/2ML IJ SOLN: 4.0000 mg | Freq: Four times a day (QID) | INTRAMUSCULAR | Status: DC | PRN

## 2015-04-03 MED ORDER — FUROSEMIDE 10 MG/ML IJ SOLN
40.0000 mg | Freq: Once | INTRAMUSCULAR | Status: AC
  Administered 2015-04-03: 40 mg via INTRAVENOUS
  Filled 2015-04-03: qty 4

## 2015-04-03 MED ORDER — ENOXAPARIN SODIUM 40 MG/0.4ML ~~LOC~~ SOLN
40.0000 mg | SUBCUTANEOUS | Status: DC
  Administered 2015-04-03 – 2015-04-05 (×3): 40 mg via SUBCUTANEOUS
  Filled 2015-04-03 (×3): qty 0.4

## 2015-04-03 MED ORDER — DEXTROSE 5 % IV SOLN
2.0000 g | INTRAVENOUS | Status: DC
  Administered 2015-04-03 – 2015-04-05 (×3): 2 g via INTRAVENOUS
  Filled 2015-04-03 (×4): qty 2

## 2015-04-03 MED ORDER — ONDANSETRON HCL 4 MG PO TABS: 4.0000 mg | ORAL_TABLET | Freq: Four times a day (QID) | ORAL | Status: DC | PRN

## 2015-04-03 MED ORDER — PROPRANOLOL HCL 10 MG PO TABS
10.0000 mg | ORAL_TABLET | Freq: Two times a day (BID) | ORAL | Status: DC
Start: 2015-04-03 — End: 2015-04-06
  Administered 2015-04-03 – 2015-04-06 (×5): 10 mg via ORAL
  Filled 2015-04-03 (×7): qty 1

## 2015-04-03 MED ORDER — ACETAMINOPHEN 650 MG RE SUPP: 650.0000 mg | Freq: Four times a day (QID) | RECTAL | Status: DC | PRN

## 2015-04-03 MED ORDER — ACETAMINOPHEN 325 MG PO TABS
650.0000 mg | ORAL_TABLET | Freq: Four times a day (QID) | ORAL | Status: DC | PRN
  Administered 2015-04-04 – 2015-04-06 (×3): 650 mg via ORAL
  Filled 2015-04-03 (×3): qty 2

## 2015-04-03 MED ORDER — RIFAXIMIN 550 MG PO TABS
550.0000 mg | ORAL_TABLET | Freq: Two times a day (BID) | ORAL | Status: DC
  Administered 2015-04-03 – 2015-04-06 (×6): 550 mg via ORAL
  Filled 2015-04-03 (×6): qty 1

## 2015-04-03 MED ORDER — ADULT MULTIVITAMIN W/MINERALS CH
1.0000 | ORAL_TABLET | Freq: Every day | ORAL | Status: DC
  Administered 2015-04-04 – 2015-04-06 (×3): 1 via ORAL
  Filled 2015-04-03 (×3): qty 1

## 2015-04-03 MED ORDER — MAGNESIUM OXIDE 400 (241.3 MG) MG PO TABS
400.0000 mg | ORAL_TABLET | Freq: Every day | ORAL | Status: DC
  Administered 2015-04-04 – 2015-04-06 (×3): 400 mg via ORAL
  Filled 2015-04-03 (×3): qty 1

## 2015-04-03 MED ORDER — SPIRONOLACTONE 25 MG PO TABS
50.0000 mg | ORAL_TABLET | Freq: Every day | ORAL | Status: DC
  Administered 2015-04-04 – 2015-04-06 (×3): 50 mg via ORAL
  Filled 2015-04-03 (×3): qty 2

## 2015-04-03 MED ORDER — GABAPENTIN 300 MG PO CAPS
600.0000 mg | ORAL_CAPSULE | Freq: Three times a day (TID) | ORAL | Status: DC
  Administered 2015-04-03 – 2015-04-06 (×8): 600 mg via ORAL
  Filled 2015-04-03 (×8): qty 2

## 2015-04-03 MED ORDER — INSULIN ASPART 100 UNIT/ML ~~LOC~~ SOLN
0.0000 [IU] | Freq: Three times a day (TID) | SUBCUTANEOUS | Status: DC
  Administered 2015-04-04: 2 [IU] via SUBCUTANEOUS
  Administered 2015-04-04: 1 [IU] via SUBCUTANEOUS
  Administered 2015-04-05 (×2): 2 [IU] via SUBCUTANEOUS
  Administered 2015-04-06: 3 [IU] via SUBCUTANEOUS

## 2015-04-03 NOTE — ED Provider Notes (Signed)
CSN: [id number]     Arrival date & time 04/03/15  1350 History   First MD Initiated Contact with Patient 04/03/15 1915     Chief Complaint  Patient presents with  . Edema  . 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 This is been ongoing since he was discharged a couple weeks ago. Patient states that physical therapist came to see him today and noted how big his abdomen wasn't told to go the ER. His abdomen is uncomfortable but denies specific pain. No fevers. Patient had to have large volume paracentesis when he is admitted a few weeks ago. Chronic cough. Dyspnea is worse with lying flat.  Past Medical History  Diagnosis Date  . Hypertension   . 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 ETOH abuse   . Ascites 01/2013  . Hematoma 01/2013    posterior right flank from a fall  . 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   . Anemia 02/2013  . Hyponatremia 01/2013  . Unspecified constipation 04/04/2013  . H pylori ulcer 03/05/2013  . Gastric ulcer with hemorrhage 03/03/2013  . Chronic wound of extremity-right great toe 03/03/2013  . Acute blood loss anemia 03/03/2013  . Spontaneous bacterial peritonitis 09/20/2014  . Hyperlipidemia   . Pneumonia ? 09/2014; 02/2015  . Chronic bronchitis   . GERD (gastroesophageal reflux disease)   . Daily headache   . Arthritis     "shoulders" (03/21/2015)  . Type II diabetes mellitus     uncontrolled  . Hepatitis C    Past Surgical History  Procedure Laterality Date  . Tonsillectomy    . Esophagogastroduodenoscopy N/A 03/03/2013    Procedure: ESOPHAGOGASTRODUODENOSCOPY (EGD);  Surgeon: Beverley Fiedler, MD;  Location: [facility] ENDOSCOPY;  Service: Gastroenterology;  Laterality: N/A;  Bedside  . Inguinal hernia repair Right 1960's  .  Paracentesis  09/2014; 02/2015; 03/21/2015   Family History  Problem Relation Age of Onset  . Dementia Mother   . Cancer - Other Father    Social History  Substance Use Topics  . Smoking status: Current Every Day Smoker -- 0.10 packs/day for 47 years    Types: Cigarettes  . Smokeless tobacco: Never Used  . Alcohol Use: Yes     Comment: 03/21/2015 "last drink was 02/15/2015)    Review of Systems  Constitutional: Negative for fever.  Respiratory: Positive for cough and shortness of breath.   Gastrointestinal: Positive for abdominal pain and abdominal distention. Negative for vomiting.  All other systems reviewed and are negative.     Allergies  Penicillins  Home Medications   Prior to Admission medications   Medication Sig Start Date End Date Taking? Authorizing Provider  albuterol (PROVENTIL HFA;VENTOLIN HFA) 108 (90 BASE) MCG/ACT inhaler Inhale 2 puffs into the lungs every 2 (two) hours as needed for wheezing or shortness of breath (or coughing). 05/30/14  Yes Dione Booze, MD  folic acid (FOLVITE) 1 MG tablet Take 1 tablet (1 mg total) by mouth daily. 09/21/14  Yes Maryruth Bun Rama, MD  furosemide (LASIX) 80 MG tablet Take 1 tablet (80 mg total) by mouth 2 (two) times daily. 02/19/15  Yes Catarina Hartshorn, MD  gabapentin (NEURONTIN) 300 MG capsule Take 600 mg by mouth 3 (three) times daily.   Yes Historical Provider, MD  insulin aspart (NOVOLOG) 100 UNIT/ML  injection Inject 0-6 Units into the skin 3 (three) times daily with meals. 0-6 Units, Subcutaneous, 3 times daily with meals  CBG < 70: implement hypoglycemia protocol; CBG 70 - 120: 0 units; CBG 121 - 150: 1 unit; CBG 151 - 200: 2 units; CBG 201 - 250: 3 units; CBG 251 - 300: 5 units; CBG 301 - 350: 7 units; CBG 351 - 400: 9 units; CBG > 400: call MD 03/10/13  Yes Shanker Levora Dredge, MD  insulin detemir (LEVEMIR) 100 UNIT/ML injection Inject 0.18 mLs (18 Units total) into the skin at bedtime. 02/19/15  Yes Catarina Hartshorn, MD  lactulose (CHRONULAC) 10  GM/15ML solution Take 15 mLs (10 g total) by mouth 2 (two) times daily. 03/24/15  Yes Maryann Mikhail, DO  Magnesium Oxide 420 MG TABS Take 1 tablet by mouth daily.   Yes Historical Provider, MD  Multiple Vitamin (MULTIVITAMIN WITH MINERALS) TABS tablet Take 1 tablet by mouth daily. 09/21/14  Yes Christina P Rama, MD  pantoprazole (PROTONIX) 40 MG tablet Take 40 mg by mouth 2 (two) times daily.    Yes Historical Provider, MD  polyvinyl alcohol (LIQUIFILM TEARS) 1.4 % ophthalmic solution Place 1 drop into both eyes daily as needed (dry eyes).    Yes Historical Provider, MD  propranolol (INDERAL) 10 MG tablet Take 1 tablet (10 mg total) by mouth 2 (two) times daily. 02/19/15  Yes Catarina Hartshorn, MD  rifaximin (XIFAXAN) 550 MG TABS tablet Take 1 tablet (550 mg total) by mouth 2 (two) times daily. 03/24/15  Yes Maryann Mikhail, DO  spironolactone (ALDACTONE) 50 MG tablet Take 1 tablet (50 mg total) by mouth daily. 03/24/15  Yes Maryann Mikhail, DO   BP 105/80 mmHg  Pulse 110  Temp(Src) 98.2 F (36.8 C) (Oral)  Resp 16  SpO2 95% Physical Exam  Constitutional: He is oriented to person, place, and time. He appears well-developed and well-nourished. No distress.  HENT:  Head: Normocephalic and atraumatic.  Right Ear: External ear normal.  Left Ear: External ear normal.  Nose: Nose normal.  Eyes: Right eye exhibits no discharge. Left eye exhibits no discharge.  Neck: Neck supple.  Cardiovascular: Normal rate, regular rhythm, normal heart sounds and intact distal pulses.   Pulmonary/Chest: Effort normal. No respiratory distress. He has rales (bibasilar).  Abdominal: Soft. He exhibits distension. There is no tenderness.  Musculoskeletal: He exhibits no edema.  Neurological: He is alert and oriented to person, place, and time.  Skin: Skin is warm and dry. He is not diaphoretic.  Nursing note and vitals reviewed.   ED Course  Procedures (including critical care time) Labs Review Labs Reviewed   COMPREHENSIVE METABOLIC PANEL - Abnormal; Notable for the following:    Sodium 132 (*)    Glucose, Bld 307 (*)    Calcium 8.6 (*)    Total Protein 6.3 (*)    Albumin 2.0 (*)    AST 48 (*)    Alkaline Phosphatase 175 (*)    All other components within normal limits  CBC - Abnormal; Notable for the following:    RBC 6.12 (*)    MCV 69.0 (*)    MCH 22.9 (*)    RDW 20.9 (*)    All other components within normal limits  LIPASE, BLOOD  URINALYSIS, ROUTINE W REFLEX MICROSCOPIC (NOT AT [facility])    Imaging Review Dg Chest 2 View  04/03/2015   CLINICAL DATA:  Shortness of breath.  EXAM: CHEST  2 VIEW  COMPARISON:  March 21, 2015.  FINDINGS: Hypoinflation of the lungs is noted. No pneumothorax or significant pleural effusion is noted. Stable bibasilar linear densities are noted most consistent with scarring or possibly atelectasis. Bony thorax is intact.  IMPRESSION: Hypoinflation of the lungs with stable bibasilar opacities consistent with scarring or subsegmental atelectasis.   Electronically Signed   By: Lupita Raider, M.D.   On: 04/03/2015 15:05   I have personally reviewed and evaluated these images and lab results as part of my medical decision-making.   EKG Interpretation   Date/Time:  Tuesday April 03 2015 13:57:38 EDT Ventricular Rate:  115 PR Interval:  158 QRS Duration: 70 QT Interval:  304 QTC Calculation: 420 R Axis:   18 Text Interpretation:  Sinus tachycardia Inferior infarct , age  undetermined Anterior infarct , age undetermined Abnormal ECG [id number]  pattern No significant change since last tracing Confirmed by Rhunette Croft,  MD, Janey Genta [phone]) on 04/03/2015 2:02:02 PM      MDM   Final diagnoses:  Ascites    Patient's dyspnea likely from large volume ascites. I viewed with an ultrasound and there is a severe amount of ascites. Will require large volume paracentesis. Given his degree of dyspnea but no respiratory distress will admit for US paracentesis and  supportive care.    Pricilla Loveless, MD 04/03/15 [phone]

## 2015-04-03 NOTE — ED Notes (Signed)
Pt was d/c from hospital 2 weeks ago for abd fluid drainage and since pt has had an increase in abd swelling, persistent cough and shortness of breath.   NAD.

## 2015-04-03 NOTE — Progress Notes (Signed)
Patient admitted into 5W19 with abdominal distension. On arrival patient alert and oriented, made comfortable in bed. Oriented to the room and educated to call the staff when in need of something. Placed on cardiac monitor and call bell placed within reach. Will keep monitoring.

## 2015-04-03 NOTE — Progress Notes (Signed)
Ceftriaxone for intra-abdominal infection per pharmacy ordered.  Ceftriaxone does not need renal adjustment.  [employer] policy allows pharmacy to change the ordered dose based on indication without contacting the physician, therefore a consult is not needed.   Plan: -ceftriaxone 2g IV q24h -pharmacy to sign off as no adjustment needed.  Lauren D. Bajbus, PharmD, BCPS Clinical Pharmacist Pager: [phone] 04/03/2015 9:19 PM

## 2015-04-03 NOTE — H&P (Signed)
[facility] History and Physical  Peace Jost ZOX:[id number] DOB: Dec 16, 1955 DOA: 04/03/2015  Referring physician: Dr. Criss Alvine. PCP: [facility]  Specialists: None.  Chief Complaint: Abdominal distention.  HPI: Curtis Estrada is a 59 y.o. male with history of cirrhosis of liver who was recently discharge 10 days ago presents to the ER because of increasing abdominal distention and lower extremity edema. Patient also has some difficulty taking deep breaths. Patient denies any nausea vomiting diarrhea fever chills. On exam patient has markedly distended abdomen with ascites. Patient also has lower extremity edema. Patient states he has been compliant with medication and his primary care from the [place] has prescribed 2 medications which he is yet to receive. Patient does not recall the name of those medications and why they were prescribed. Patient has been admitted for decompensated cirrhosis. Patient states he has stopped drinking alcohol 3 months ago.   Review of Systems: As presented in the history of presenting illness, rest negative.  Past Medical History  Diagnosis Date  . Hypertension   . 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 ETOH abuse   . Ascites 01/2013  . Hematoma 01/2013    posterior right flank from a fall  . 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   . Anemia 02/2013  . Hyponatremia 01/2013  . Unspecified constipation 04/04/2013  . H pylori ulcer 03/05/2013  . Gastric ulcer with hemorrhage 03/03/2013  . Chronic wound of extremity-right great toe 03/03/2013  . Acute blood loss anemia 03/03/2013  . Spontaneous bacterial peritonitis 09/20/2014  . Hyperlipidemia   . Pneumonia ? 09/2014; 02/2015  . Chronic bronchitis   . GERD (gastroesophageal reflux disease)   . Daily headache   . Arthritis     "shoulders" (03/21/2015)  . Type II diabetes mellitus     uncontrolled  . Hepatitis C    Past Surgical History  Procedure Laterality Date  . Tonsillectomy     . Esophagogastroduodenoscopy N/A 03/03/2013    Procedure: ESOPHAGOGASTRODUODENOSCOPY (EGD);  Surgeon: Beverley Fiedler, MD;  Location: [facility] END[facility];  Service: Gastroenterology;  Laterality: N/A;  Bedside  . Inguinal hernia repair Right 1960's  . Paracentesis  09/2014; 02/2015; 03/21/2015   Social History:  reports that he has been smoking Cigarettes.  He has a 4.7 pack-year smoking history. He has never used smokeless tobacco. He reports that he drinks alcohol. He reports that he does not use illicit drugs. Where does patient live home. Can patient participate in ADLs? Yes.  Allergies  Allergen Reactions  . Penicillins     Childhood Has received Rocephin w/o reaction    Family History:  Family History  Problem Relation Age of Onset  . Dementia Mother   . Cancer - Other Father       Prior to Admission medications   Medication Sig Start Date End Date Taking? Authorizing Provider  albuterol (PROVENTIL HFA;VENTOLIN HFA) 108 (90 BASE) MCG/ACT inhaler Inhale 2 puffs into the lungs every 2 (two) hours as needed for wheezing or shortness of breath (or coughing). 05/30/14  Yes Dione Booze, MD  folic acid (FOLVITE) 1 MG tablet Take 1 tablet (1 mg total) by mouth daily. 09/21/14  Yes Maryruth Bun Rama, MD  furosemide (LASIX) 80 MG tablet Take 1 tablet (80 mg total) by mouth 2 (two) times daily. 02/19/15  Yes Catarina Hartshorn, MD  gabapentin (NEURONTIN) 300 MG capsule Take 600 mg by mouth 3 (three) times daily.   Yes Historical Provider,  MD  insulin aspart (NOVOLOG) 100 UNIT/ML injection Inject 0-6 Units into the skin 3 (three) times daily with meals. 0-6 Units, Subcutaneous, 3 times daily with meals  CBG < 70: implement hypoglycemia protocol; CBG 70 - 120: 0 units; CBG 121 - 150: 1 unit; CBG 151 - 200: 2 units; CBG 201 - 250: 3 units; CBG 251 - 300: 5 units; CBG 301 - 350: 7 units; CBG 351 - 400: 9 units; CBG > 400: call MD 03/10/13  Yes Shanker Levora Dredge, MD  insulin detemir (LEVEMIR) 100 UNIT/ML injection Inject  0.18 mLs (18 Units total) into the skin at bedtime. 02/19/15  Yes Catarina Hartshorn, MD  lactulose (CHRONULAC) 10 GM/15ML solution Take 15 mLs (10 g total) by mouth 2 (two) times daily. 03/24/15  Yes Maryann Mikhail, DO  Magnesium Oxide 420 MG TABS Take 1 tablet by mouth daily.   Yes Historical Provider, MD  Multiple Vitamin (MULTIVITAMIN WITH MINERALS) TABS tablet Take 1 tablet by mouth daily. 09/21/14  Yes Christina P Rama, MD  pantoprazole (PROTONIX) 40 MG tablet Take 40 mg by mouth 2 (two) times daily.    Yes Historical Provider, MD  polyvinyl alcohol (LIQUIFILM TEARS) 1.4 % ophthalmic solution Place 1 drop into both eyes daily as needed (dry eyes).    Yes Historical Provider, MD  propranolol (INDERAL) 10 MG tablet Take 1 tablet (10 mg total) by mouth 2 (two) times daily. 02/19/15  Yes Catarina Hartshorn, MD  rifaximin (XIFAXAN) 550 MG TABS tablet Take 1 tablet (550 mg total) by mouth 2 (two) times daily. 03/24/15  Yes Maryann Mikhail, DO  spironolactone (ALDACTONE) 50 MG tablet Take 1 tablet (50 mg total) by mouth daily. 03/24/15  Yes Edsel Petrin, DO    Physical Exam: Filed Vitals:   04/03/15 1930 04/03/15 1945 04/03/15 2000 04/03/15 2015  BP:  82/48 107/79 106/81  Pulse:      Temp:      TempSrc:      Resp:  SpO2: 95%        General:  Moderately built and poorly nourished.  Eyes: Anicteric no pallor.  ENT: No discharge from the ears eyes nose and mouth.  Neck: No JVD appreciated. No mass felt.  Cardiovascular: S1 and S2 heard.  Respiratory: No rhonchi or crepitations.  Abdomen: Distended abdomen nontender bowel sounds present.  Skin: No rash.  Musculoskeletal: Bilateral lower extremity edema.  Psychiatric: Appears normal.  Neurologic: Alert awake oriented to time place and person. Moves all extremities.  Labs on Admission:  Basic Metabolic Panel:  Recent Labs Lab 04/03/15 1411  NA 132*  K 4.1  CL 102  CO2 22  GLUCOSE 307*  BUN 8  CREATININE 0.93  CALCIUM 8.6*    Liver Function Tests:  Recent Labs Lab 04/03/15 1411  AST 48*  ALT 25  ALKPHOS 175*  BILITOT 1.0  PROT 6.3*  ALBUMIN 2.0*    Recent Labs Lab 04/03/15 1411  LIPASE 35   No results for input(s): AMMONIA in the last 168 hours. CBC:  Recent Labs Lab 04/03/15 1411  WBC 7.1  HGB 14.0  HCT 42.2  MCV 69.0*  PLT 246   Cardiac Enzymes: No results for input(s): CKTOTAL, CKMB, CKMBINDEX, TROPONINI in the last 168 hours.  BNP (last 3 results)  Recent Labs  09/19/14 1920 02/12/15 0907  BNP 29.8 19.0    ProBNP (last 3 results) No results for input(s): PROBNP in the last 8760 hours.  CBG: No results for  input(s): GLUCAP in the last 168 hours.  Radiological Exams on Admission: Dg Chest 2 View  04/03/2015   CLINICAL DATA:  Shortness of breath.  EXAM: CHEST  2 VIEW  COMPARISON:  March 21, 2015.  FINDINGS: Hypoinflation of the lungs is noted. No pneumothorax or significant pleural effusion is noted. Stable bibasilar linear densities are noted most consistent with scarring or possibly atelectasis. Bony thorax is intact.  IMPRESSION: Hypoinflation of the lungs with stable bibasilar opacities consistent with scarring or subsegmental atelectasis.   Electronically Signed   By: Lupita Raider, M.D.   On: 04/03/2015 15:05     Assessment/Plan Active Problems:   Ascites   Uncontrolled type 2 diabetes mellitus   Decompensated hepatic cirrhosis   1. Ascites and lower extremity edema secondary to decompensated cirrhosis - at this time I am ordering one dose of IV Lasix 40 mg and will continue with patient's home dose of Lasix 80 mg twice a day and spironolactone. Patient does have significant ascites and lower extremity edema. I have ordered ultrasound-guided paracentesis. Closely follow intake output metabolic panel. Patient may need to be set up for regular scheduled paracentesis as outpatient since patient has recommendations. At this time since patient also has some abdominal  discomfort probably from the significant ascites but until we get paracentesis fluid studies I have placed patient on ceftriaxone to cover for SBP. 2. Uncontrolled diabetes mellitus type 2 last hemoglobin A1c as per the records were 8.4 - continue insulin and sliding scale and closely follow CBGs for further titration of insulin dose. 3. History of hepatitis C - further workup as outpatient. 4. History of alcohol abuse - patient states he has not had any alcohol last 3 months.   DVT Prophylaxis Lovenox.  Code Status: Full code.  Family Communication: Discussed with patient.  Disposition Plan: Admit to inpatient.    KAKRAKANDY,ARSHAD N. [facility] Pager [phone].  If 7PM-7AM, please contact night-coverage [email or web address] Password [email or web address] 04/03/2015, 9:18 PM

## 2015-04-04 ENCOUNTER — Inpatient Hospital Stay (HOSPITAL_COMMUNITY): Payer: Non-veteran care

## 2015-04-04 DIAGNOSIS — E1165 Type 2 diabetes mellitus with hyperglycemia: Secondary | ICD-10-CM

## 2015-04-04 DIAGNOSIS — R188 Other ascites: Secondary | ICD-10-CM

## 2015-04-04 DIAGNOSIS — K7290 Hepatic failure, unspecified without coma: Secondary | ICD-10-CM

## 2015-04-04 LAB — BODY FLUID CELL COUNT WITH DIFFERENTIAL
Eos, Fluid: 0 %
Lymphs, Fluid: 85 %
Monocyte-Macrophage-Serous Fluid: 15 % — ABNORMAL LOW (ref 50–90)
NEUTROPHIL FLUID: 0 % (ref 0–25)
OTHER CELLS FL: 0 %
Total Nucleated Cell Count, Fluid: 331 cu mm (ref 0–1000)

## 2015-04-04 LAB — BASIC METABOLIC PANEL
Anion gap: 7 (ref 5–15)
BUN: 10 mg/dL (ref 6–20)
CHLORIDE: 101 mmol/L (ref 101–111)
CO2: 25 mmol/L (ref 22–32)
Calcium: 8.1 mg/dL — ABNORMAL LOW (ref 8.9–10.3)
Creatinine, Ser: 1.17 mg/dL (ref 0.61–1.24)
GFR calc Af Amer: >60 mL/min (ref >60)
GFR calc non Af Amer: >60 mL/min (ref >60)
GLUCOSE: 119 mg/dL — AB (ref 65–99)
POTASSIUM: 3.7 mmol/L (ref 3.5–5.1)
SODIUM: 133 mmol/L — AB (ref 135–145)

## 2015-04-04 LAB — CBC
HEMATOCRIT: 39.6 % (ref 39.0–52.0)
HEMOGLOBIN: 13 g/dL (ref 13.0–17.0)
MCH: 22.5 pg — ABNORMAL LOW (ref 26.0–34.0)
MCHC: 32.8 g/dL (ref 30.0–36.0)
MCV: 68.6 fL — AB (ref 78.0–100.0)
Platelets: 271 10*3/uL (ref 150–400)
RBC: 5.77 MIL/uL (ref 4.22–5.81)
RDW: 20.9 % — AB (ref 11.5–15.5)
WBC: 7.9 10*3/uL (ref 4.0–10.5)

## 2015-04-04 LAB — GLUCOSE, CAPILLARY
Glucose-Capillary: 94 mg/dL (ref 65–99)
Glucose-Capillary: 122 mg/dL — ABNORMAL HIGH (ref 65–99)
Glucose-Capillary: 158 mg/dL — ABNORMAL HIGH (ref 65–99)
Glucose-Capillary: 183 mg/dL — ABNORMAL HIGH (ref 65–99)

## 2015-04-04 LAB — URINALYSIS, ROUTINE W REFLEX MICROSCOPIC
BILIRUBIN URINE: NEGATIVE
Glucose, UA: NEGATIVE mg/dL
Hgb urine dipstick: NEGATIVE
Ketones, ur: NEGATIVE mg/dL
Leukocytes, UA: NEGATIVE
NITRITE: NEGATIVE
PH: 5 (ref 5.0–8.0)
Protein, ur: NEGATIVE mg/dL
SPECIFIC GRAVITY, URINE: 1.009 (ref 1.005–1.030)
Urobilinogen, UA: 1 mg/dL (ref 0.0–1.0)

## 2015-04-04 LAB — PROTEIN, BODY FLUID

## 2015-04-04 LAB — ALBUMIN, FLUID (OTHER): Albumin, Fluid: <1 g/dL

## 2015-04-04 LAB — GRAM STAIN

## 2015-04-04 MED ORDER — LIDOCAINE HCL (PF) 1 % IJ SOLN
INTRAMUSCULAR | Status: AC
  Administered 2015-04-04: 14:00:00
  Filled 2015-04-04: qty 10

## 2015-04-04 NOTE — Progress Notes (Addendum)
Left message with April Alexander [phone] ext [phone] at [facility] to notify of admission, and call back with SW contact info and to find out if patient is receiving any home therapies. Spoke with [employer] SW at [place]. [phone] pager. Patient has HH PT with [facility]. Will need resumption of services order. If further HH needs arise, CM to contact [place] for [place] approval.  If no further DC needs arise, CM to call April to notify of discharge.

## 2015-04-04 NOTE — Procedures (Signed)
Successful US guided paracentesis from LLQ.  Yielded 5 liters of cloudy yellow fluid.  No immediate complications.  Pt tolerated well. Stopped with evidence of remaining ascites per patient's request and hypotension   Specimen was sent for labs.  Pattricia Boss D PA-C 04/04/2015 2:24 PM

## 2015-04-04 NOTE — Consult Note (Signed)
Sonja M. Wilson EdD 

## 2015-04-04 NOTE — Progress Notes (Signed)
[facility] PROGRESS NOTE  Curtis Estrada ZOX:[id number] DOB: May 20, 1956 DOA: 04/03/2015 PCP: [facility]  Brief narrative 59 year old male with history of hep C/EtOH decompensated cirrhosis with multiple hospitalization for ascites requiring therapeutic paracentesis presented to the ED with increased abdominal distention and lower extremity edema. Patient denies any abdominal pain nausea, vomiting, fever, chills or diarrhea. On presentation he had marketed abdominal distention with ascites. He reports falling with the [place]. Patient admitted for further management.  Assessment/Plan: Decompensated hepatic cirrhosis with ascites Patient received one dose IV Lasix 40 mg on admission and his home dose of Lasix and Aldactone were resumed. He underwent large-volume paracenteses with 5 L ascites fluid removed. Follow body fluid cell count and culture. Continue empiric Rocephin for now. He is recommended to follow with outpatient GI. Will confirm with his wife he is plan to be followed up with GI at the [place]. Resume home dose rifaximin, lactulose and propranolol.  Uncontrolled diabetes mellitus type 2 Last A1c of 8.4. Continue home dose insulin along with sliding scale coverage.  History of hep C Further management as outpatient.  History of alcohol abuse Reports quitting alcohol 3 months back.   DVT prophylaxis: Subcutaneous Lovenox   diet: Diabetic with fluid restriction  Code Status: Full code Family Communication: None at bedside Disposition Plan: Home once improved   Consultants:  None  Procedures:  Large volume paracenteses (5 L on 9/21)  Antibiotics:  IV Rocephin  HPI/Subjective: Seen and examined this morning. Complains of abdominal discomfort with distention and bulging over suprapubic area with coughing.  Objective: Filed Vitals:   04/04/15 1544  BP: 95/71  Pulse: 99  Temp: 97.5 F (36.4 C)  Resp: 20    Intake/Output Summary (Last 24 hours) at  04/04/15 1551 Last data filed at 04/04/15 1358  Gross per 24 hour  Intake    560 ml  Output    250 ml  Net    310 ml   Filed Weights   04/03/15 2137  Weight: 90.583 kg (199 lb 11.2 oz)    Exam:   General:  Elderly male in no acute distress  HEENT: No pallor, no icterus, moist oral mucosa   chest: Clear to auscultation bilaterally  CVS: Normal S1 and S2, no murmurs rub or gallop  GI: Marketed abdominal distention, nontender,  Musculoskeletal: Warm, trace bilateral pitting edema  CNS: Alert and oriented, no tremors  Data Reviewed: Basic Metabolic Panel:  Recent Labs Lab 04/03/15 1411 04/04/15 0503  NA 132* 133*  K 4.1 3.7  CL 102 101  CO2 22 25  GLUCOSE 307* 119*  BUN 8 10  CREATININE 0.93 1.17  CALCIUM 8.6* 8.1*   Liver Function Tests:  Recent Labs Lab 04/03/15 1411  AST 48*  ALT 25  ALKPHOS 175*  BILITOT 1.0  PROT 6.3*  ALBUMIN 2.0*    Recent Labs Lab 04/03/15 1411  LIPASE 35   No results for input(s): AMMONIA in the last 168 hours. CBC:  Recent Labs Lab 04/03/15 1411 04/04/15 0503  WBC 7.1 7.9  HGB 14.0 13.0  HCT 42.2 39.6  MCV 69.0* 68.6*  PLT 246 271   Cardiac Enzymes: No results for input(s): CKTOTAL, CKMB, CKMBINDEX, TROPONINI in the last 168 hours. BNP (last 3 results)  Recent Labs  09/19/14 1920 02/12/15 0907  BNP 29.8 19.0    ProBNP (last 3 results) No results for input(s): PROBNP in the last 8760 hours.  CBG:  Recent Labs Lab 04/03/15 2147 04/04/15 0813  GLUCAP  144* 94    No results found for this or any previous visit (from the past 240 hour(s)).   Studies: Dg Chest 2 View  04/03/2015   CLINICAL DATA:  Shortness of breath.  EXAM: CHEST  2 VIEW  COMPARISON:  March 21, 2015.  FINDINGS: Hypoinflation of the lungs is noted. No pneumothorax or significant pleural effusion is noted. Stable bibasilar linear densities are noted most consistent with scarring or possibly atelectasis. Bony thorax is intact.   IMPRESSION: Hypoinflation of the lungs with stable bibasilar opacities consistent with scarring or subsegmental atelectasis.   Electronically Signed   By: Lupita Raider, M.D.   On: 04/03/2015 15:05   US Paracentesis  04/04/2015   INDICATION: Hepatitis C, cirrhosis, recurrent ascites and request for paracentesis.  EXAM: ULTRASOUND-GUIDED PARACENTESIS  COMPARISON:  Paracentesis 03/23/2015.  MEDICATIONS: None.  COMPLICATIONS: None immediate  TECHNIQUE: Informed written consent was obtained from the patient after a discussion of the risks, benefits and alternatives to treatment. A timeout was performed prior to the initiation of the procedure.  Initial ultrasound scanning demonstrates a large amount of ascites within the left lower abdominal quadrant. The left lower abdomen was prepped and draped in the usual sterile fashion. 1% lidocaine was used for local anesthesia.  Under direct ultrasound guidance, a 19 gauge, 7-cm, Yueh catheter was introduced. An ultrasound image was saved for documentation purposed. The paracentesis was performed. The catheter was removed and a dressing was applied. The patient tolerated the procedure well without immediate post procedural complication.  FINDINGS: A total of approximately 5 liters of cloudy yellow fluid was removed. Samples were sent to the laboratory as requested by the clinical team.  IMPRESSION: Successful ultrasound-guided paracentesis yielding 5 liters of peritoneal fluid.  Read By:  Pattricia Boss PA-C   Electronically Signed   By: Simonne Come M.D.   On: 04/04/2015 14:44    Scheduled Meds: . cefTRIAXone (ROCEPHIN)  IV  2 g Intravenous Q24H  . enoxaparin (LOVENOX) injection  40 mg Subcutaneous Q24H  . folic acid  1 mg Oral Daily  . furosemide  80 mg Oral BID  . gabapentin  600 mg Oral TID  . insulin aspart  0-9 Units Subcutaneous TID WC  . insulin detemir  18 Units Subcutaneous QHS  . lactulose  10 g Oral BID  . lidocaine (PF)      . magnesium oxide  400 mg  Oral Daily  . multivitamin with minerals  1 tablet Oral Daily  . pantoprazole  40 mg Oral BID  . propranolol  10 mg Oral BID  . rifaximin  550 mg Oral BID  . sodium chloride  3 mL Intravenous Q12H  . spironolactone  50 mg Oral Daily   Continuous Infusions:     Time spent: 25 minutes    DHUNGEL, NISHANT  [facility] Pager [phone] If 7PM-7AM, please contact night-coverage at [email or web address], password [facility] 04/04/2015, 3:51 PM  LOS: 1 day

## 2015-04-04 NOTE — Care Management Note (Addendum)
Case Management Note  Patient Details  Name: Curtis Estrada MRN: [id number] Date of Birth: [birth year]  Subjective/Objective:                  Date: 04-04-15 Wednesday Spoke with patient at the bedside. Introduced self as Sports coach and explained role in discharge planning and how to be reached. Verified patient lives in [place] in apartment on [address]th floor on [address],, has DME cane walker two wheeled, oxygen. Expressed potential need for no other DME. Verified patient anticipates to go  home alone, at time of discharge and will have  part-time supervision by a girl who comes and cleans, 5 sisters, and friends at this time to best of his knowledge. Patient denied needing help with their medication, [facility] pays [facility] meds and sends them in the mail. Patient driven by [facility] transportation to MD appointments. Verified patient has PCP  Dr Teodoro Spray at [facility].  Patient is active with [facility] for PT,  If further HH needs arise in addition to PT call Kirkland Hun at [facility] to approve (see previous note for contact info).  Plan: CM will continue to follow for discharge planning and [facility] resources.   Lawerance Sabal RN BSN CM [phone]   Action/Plan:  Resume order for [facility] placed and referral made to [facility]. Patient [facility] with [facility].   Expected Discharge Date:  04/05/15               Expected Discharge Plan:  Home/Self Care  In-House Referral:     Discharge planning Services  CM Consult  Post Acute Care Choice:    Choice offered to:     DME Arranged:    DME Agency:     HH Arranged:    HH Agency:     Status of Service:  In process, will continue to follow  Medicare Important Message Given:    Date Medicare IM Given:    Medicare IM give by:    Date Additional Medicare IM Given:    Additional Medicare Important Message give by:     If discussed at Long Length of Stay Meetings, dates discussed:    Additional Comments:  Lawerance Sabal, RN 04/04/2015, 11:21 AM

## 2015-04-04 NOTE — Progress Notes (Signed)
[facility]  Patient Status: Active (receiving services up to time of hospitalization)  [facility] is providing the following services: PT  If patient discharges after hours, please call [phone].   Curtis Estrada 04/04/2015, 4:26 PM

## 2015-04-05 ENCOUNTER — Inpatient Hospital Stay (HOSPITAL_COMMUNITY): Payer: Non-veteran care

## 2015-04-05 DIAGNOSIS — I959 Hypotension, unspecified: Secondary | ICD-10-CM

## 2015-04-05 LAB — GLUCOSE, CAPILLARY
GLUCOSE-CAPILLARY: 189 mg/dL — AB (ref 65–99)
GLUCOSE-CAPILLARY: 161 mg/dL — AB (ref 65–99)
Glucose-Capillary: 91 mg/dL (ref 65–99)
Glucose-Capillary: 127 mg/dL — ABNORMAL HIGH (ref 65–99)

## 2015-04-05 MED ORDER — LIDOCAINE HCL (PF) 1 % IJ SOLN
INTRAMUSCULAR | Status: AC
  Filled 2015-04-05: qty 10

## 2015-04-05 MED ORDER — ALBUMIN HUMAN 25 % IV SOLN
50.0000 g | Freq: Once | INTRAVENOUS | Status: AC
  Administered 2015-04-05: 50 g via INTRAVENOUS
  Filled 2015-04-05: qty 200

## 2015-04-05 NOTE — Procedures (Signed)
Successful US guided paracentesis from rlq.  Yielded 6.2 liters of cloudy yellow fluid.  No immediate complications.  Pt tolerated well.   Specimen was not sent for labs.  Pattricia Boss D PA-C 04/05/2015 4:30 PM

## 2015-04-05 NOTE — Progress Notes (Signed)
Received call from April Alexander at [facility], updated with clinical picture for today and discharge in next 1-2 days.

## 2015-04-05 NOTE — Progress Notes (Signed)
[facility] PROGRESS NOTE  Curtis Estrada JYN:[id number] DOB: 1955/11/21 DOA: 04/03/2015 PCP: [facility]  Brief narrative 59 year old male with history of hep C/EtOH decompensated cirrhosis with multiple hospitalization for ascites requiring therapeutic paracentesis presented to the ED with increased abdominal distention and lower extremity edema. Patient denies any abdominal pain nausea, vomiting, fever, chills or diarrhea. On presentation he had marketed abdominal distention with ascites. He reports falling with the [place]. Patient admitted for further management.  Assessment/Plan: Decompensated hepatic cirrhosis with ascites - home dose of Lasix and Aldactone were resumed. He underwent large-volume paracenteses with 5 L ascites fluid removed. Fluid negative for SBP. Resume home dose rifaximin, lactulose and propranolol. -We will order another large-volume paracentesis today if blood pressure permits. Ordered IV albumin with paracentesis. -Patient reports that he has outpatient follow-up with GI. Was seen by [facility] during prior hospitalization in 2014 but does not have anything scheduled for follow-up. I will verify with the patient and his wife.  Uncontrolled diabetes mellitus type 2 Last A1c of 8.4. Continue home dose insulin along with sliding scale coverage.  History of hep C Further management as outpatient.  History of alcohol abuse Reports quitting alcohol 3 months back.   DVT prophylaxis: Subcutaneous Lovenox   diet: Diabetic with fluid restriction  Code Status: Full code Family Communication: None at bedside Disposition Plan: Home possibly in 1-2 days   Consultants:  None  Procedures:  Large volume paracenteses (5 L on 9/21)  Antibiotics:  IV Rocephin  HPI/Subjective: Seen and examined this morning. 5 L ascites fluid removed yesterday. Still has abdominal distention. Denies abdominal pain.  Objective: Filed Vitals:   04/05/15 1145  BP: 82/62   Pulse: 96  Temp:   Resp:     Intake/Output Summary (Last 24 hours) at 04/05/15 1306 Last data filed at 04/05/15 0930  Gross per 24 hour  Intake    600 ml  Output   1525 ml  Net   -925 ml   Filed Weights   04/03/15 2137  Weight: 90.583 kg (199 lb 11.2 oz)    Exam:   General:  Elderly male in no acute distress  HEENT: No pallor, no icterus, moist oral mucosa   chest: Clear to auscultation bilaterally  CVS: Normal S1 and S2, no murmurs rub or gallop  GI: Significant abdominal distention, nontender,  Musculoskeletal: Warm, trace bilateral pitting edema  CNS: Alert and oriented, no tremors  Data Reviewed: Basic Metabolic Panel:  Recent Labs Lab 04/03/15 1411 04/04/15 0503  NA 132* 133*  K 4.1 3.7  CL 102 101  CO2 22 25  GLUCOSE 307* 119*  BUN 8 10  CREATININE 0.93 1.17  CALCIUM 8.6* 8.1*   Liver Function Tests:  Recent Labs Lab 04/03/15 1411  AST 48*  ALT 25  ALKPHOS 175*  BILITOT 1.0  PROT 6.3*  ALBUMIN 2.0*    Recent Labs Lab 04/03/15 1411  LIPASE 35   No results for input(s): AMMONIA in the last 168 hours. CBC:  Recent Labs Lab 04/03/15 1411 04/04/15 0503  WBC 7.1 7.9  HGB 14.0 13.0  HCT 42.2 39.6  MCV 69.0* 68.6*  PLT 246 271   Cardiac Enzymes: No results for input(s): CKTOTAL, CKMB, CKMBINDEX, TROPONINI in the last 168 hours. BNP (last 3 results)  Recent Labs  09/19/14 1920 02/12/15 0907  BNP 29.8 19.0    ProBNP (last 3 results) No results for input(s): PROBNP in the last 8760 hours.  CBG:  Recent Labs Lab 04/04/15  0813 04/04/15 1251 04/04/15 1711 04/04/15 2241 04/05/15 0800  GLUCAP 94 122* 158* 183* 91    Recent Results (from the past 240 hour(s))  Gram stain     Status: None   Collection Time: 04/04/15  2:09 PM  Result Value Ref Range Status   Specimen Description FLUID ASCITIC  Final   Special Requests NONE  Final   Gram Stain   Final    ABUNDANT WBC PRESENT,BOTH PMN AND MONONUCLEAR NO ORGANISMS  SEEN    Report Status 04/04/2015 FINAL  Final     Studies: Dg Chest 2 View  04/03/2015   CLINICAL DATA:  Shortness of breath.  EXAM: CHEST  2 VIEW  COMPARISON:  March 21, 2015.  FINDINGS: Hypoinflation of the lungs is noted. No pneumothorax or significant pleural effusion is noted. Stable bibasilar linear densities are noted most consistent with scarring or possibly atelectasis. Bony thorax is intact.  IMPRESSION: Hypoinflation of the lungs with stable bibasilar opacities consistent with scarring or subsegmental atelectasis.   Electronically Signed   By: Lupita Raider, M.D.   On: 04/03/2015 15:05   US Paracentesis  04/04/2015   INDICATION: Hepatitis C, cirrhosis, recurrent ascites and request for paracentesis.  EXAM: ULTRASOUND-GUIDED PARACENTESIS  COMPARISON:  Paracentesis 03/23/2015.  MEDICATIONS: None.  COMPLICATIONS: None immediate  TECHNIQUE: Informed written consent was obtained from the patient after a discussion of the risks, benefits and alternatives to treatment. A timeout was performed prior to the initiation of the procedure.  Initial ultrasound scanning demonstrates a large amount of ascites within the left lower abdominal quadrant. The left lower abdomen was prepped and draped in the usual sterile fashion. 1% lidocaine was used for local anesthesia.  Under direct ultrasound guidance, a 19 gauge, 7-cm, Yueh catheter was introduced. An ultrasound image was saved for documentation purposed. The paracentesis was performed. The catheter was removed and a dressing was applied. The patient tolerated the procedure well without immediate post procedural complication.  FINDINGS: A total of approximately 5 liters of cloudy yellow fluid was removed. Samples were sent to the laboratory as requested by the clinical team.  IMPRESSION: Successful ultrasound-guided paracentesis yielding 5 liters of peritoneal fluid.  Read By:  Pattricia Boss PA-C   Electronically Signed   By: Simonne Come M.D.   On:  04/04/2015 14:44    Scheduled Meds: . albumin human  50 g Intravenous Once  . cefTRIAXone (ROCEPHIN)  IV  2 g Intravenous Q24H  . enoxaparin (LOVENOX) injection  40 mg Subcutaneous Q24H  . folic acid  1 mg Oral Daily  . furosemide  80 mg Oral BID  . gabapentin  600 mg Oral TID  . insulin aspart  0-9 Units Subcutaneous TID WC  . insulin detemir  18 Units Subcutaneous QHS  . lactulose  10 g Oral BID  . magnesium oxide  400 mg Oral Daily  . multivitamin with minerals  1 tablet Oral Daily  . pantoprazole  40 mg Oral BID  . propranolol  10 mg Oral BID  . rifaximin  550 mg Oral BID  . sodium chloride  3 mL Intravenous Q12H  . spironolactone  50 mg Oral Daily   Continuous Infusions:     Time spent: 25 minutes    DHUNGEL, NISHANT  [facility] Pager [phone] If 7PM-7AM, please contact night-coverage at [email or web address], password [facility] 04/05/2015, 1:06 PM  LOS: 2 days

## 2015-04-06 DIAGNOSIS — I9589 Other hypotension: Secondary | ICD-10-CM

## 2015-04-06 DIAGNOSIS — K7469 Other cirrhosis of liver: Secondary | ICD-10-CM

## 2015-04-06 DIAGNOSIS — E46 Unspecified protein-calorie malnutrition: Secondary | ICD-10-CM

## 2015-04-06 DIAGNOSIS — R600 Localized edema: Secondary | ICD-10-CM

## 2015-04-06 DIAGNOSIS — K7460 Unspecified cirrhosis of liver: Principal | ICD-10-CM

## 2015-04-06 DIAGNOSIS — B182 Chronic viral hepatitis C: Secondary | ICD-10-CM

## 2015-04-06 DIAGNOSIS — I959 Hypotension, unspecified: Secondary | ICD-10-CM | POA: Diagnosis not present

## 2015-04-06 LAB — GLUCOSE, CAPILLARY
GLUCOSE-CAPILLARY: 137 mg/dL — AB (ref 65–99)
GLUCOSE-CAPILLARY: 215 mg/dL — AB (ref 65–99)

## 2015-04-06 NOTE — Progress Notes (Signed)
Patient discharged to home, Discharge instructions and education reviewed and all questions addressed, IV dc'd. Vitals stable for pt.04/06/2015 2:54 PM Bowie,Sharlene

## 2015-04-06 NOTE — Progress Notes (Addendum)
Informed by [name] RN that patient does not need oxygen. Curtis Estrada SW notified that patient will be discharged today, and he will resume HH PT with [facility]. Referral to [name] at [facility] made, notified of discharge today.

## 2015-04-06 NOTE — Progress Notes (Signed)
Floor coverage Craige Cotta NP paged regarding patient's BP of 82/58 manually.

## 2015-04-06 NOTE — Discharge Summary (Addendum)
Physician Discharge Summary  Curtis Estrada ZOX:[id number] DOB: 12/14/1955 DOA: 04/03/2015  PCP: [facility]  Admit date: 04/03/2015 Discharge date: 04/06/2015  Time spent: 35 minutes  Recommendations for Outpatient Follow-up:  1. Discharge home with outpatient follow-up at [facility]. He has an appointment to see michel everhart PA the GI clinic on 04/17/2015 at 8:30 AM.  Discharge Diagnoses:  Principal Problem:   Decompensated hepatic cirrhosis   Active Problems:   Cirrhosis, alcoholic/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   Hepatitis C   Ascites   Essential hypertension, benign   Uncontrolled type 2 diabetes mellitus   Lower extremity edema   Decompensated HCV cirrhosis   Hypotension   Discharge Condition: Fair  Diet recommendation: Diabetic/low sodium  Filed Weights   04/03/15 2137 04/06/15 0500  Weight: 90.583 kg (199 lb 11.2 oz) 77.928 kg (171 lb 12.8 oz)    History of present illness:  Please refer to admission H&P for details, in brief,59 year old male with history of hep C/EtOH decompensated cirrhosis with multiple hospitalization for ascites requiring therapeutic paracentesis presented to the ED with increased abdominal distention and lower extremity edema. Patient denies any abdominal pain nausea, vomiting, fever, chills or diarrhea. On presentation he had marketed abdominal distention with ascites. Patient was recently prescribed rifaximin and Aldactone at the [facility] and is awaiting for his medications. Patient admitted for further management.  Hospital Course:  Decompensated hepatic cirrhosis with ascites - Patient placed on his home dose of Lasix and Aldactone (patient has not started taking Aldactone as outpatient and awaiting prescription from his [place]).  He underwent large-volume paracenteses with ascites fluid removed on 9/21 and 9/22 (5 L and 6.5 L respectively). Fluid analysis negative for SBP. Cultures unremarkable. Received albumin with paracentesis. Resume home dose   lactulose and propranolol. Also awaiting rifaximin as outpatient. -Patient has an appointment to see GI at the [facility] in <2 weeks. Patient instructed to monitor his diet and call his PCP or return to the ED if he has worsened abdominal distention, abdominal pain, confusion, nausea, vomiting or hematemesis. Encouraged to be compliant with these medications and keep up his outpatient appointment. -Abdominal distention and shortness of breath has much improved after paracentesis. Patient ambulatory with normal O2 sat on room air.  Uncontrolled diabetes mellitus type 2 Last A1c of 8.4. Continue home dose insulin  History of hep C Further management as outpatient.  History of alcohol abuse Reports quitting alcohol 3 months back.  Protein calorie malnutrition Recommend nutrition evaluation as outpatient.  Low systolic blood pressure. Lasix and Aldactone were held this morning. Asymptomatic. May resume his medications from tomorrow. Follow-up PCP in 1 week.  Code Status: Full code Family Communication: None at bedside Disposition Plan: Home with outpatient follow-up at the [facility]   Consultants:  None  Procedures:  Large volume paracenteses (5 L on 9/21) and 6.5 L on 9/22  Antibiotics:  IV Rocephin x1    Discharge Exam: Filed Vitals:   04/06/15 0653  BP: 82/58  Pulse:   Temp:   Resp:      General: Elderly male in no acute distress  HEENT: No pallor, no icterus, moist oral mucosa   chest: Clear to auscultation bilaterally  CVS: Normal S1 and S2, no murmurs rub or gallop  GI: Abdominal distention markedly improved, nontender, bowel sounds present  Musculoskeletal: Warm, trace bilateral pitting edema  CNS: Alert and oriented, no tremors  Discharge Instructions    Current Discharge Medication List    CONTINUE these medications which have NOT  CHANGED   Details  albuterol (PROVENTIL HFA;VENTOLIN HFA) 108 (90 BASE) MCG/ACT inhaler Inhale 2 puffs into the  lungs every 2 (two) hours as needed for wheezing or shortness of breath (or coughing). Qty: 1 Inhaler, Refills: 0    folic acid (FOLVITE) 1 MG tablet Take 1 tablet (1 mg total) by mouth daily. Qty: 30 tablet, Refills: 2    furosemide (LASIX) 80 MG tablet Take 1 tablet (80 mg total) by mouth 2 (two) times daily. Qty: 60 tablet, Refills: 0    gabapentin (NEURONTIN) 300 MG capsule Take 600 mg by mouth 3 (three) times daily.    insulin aspart (NOVOLOG) 100 UNIT/ML injection Inject 0-6 Units into the skin 3 (three) times daily with meals. 0-6 Units, Subcutaneous, 3 times daily with meals  CBG < 70: implement hypoglycemia protocol; CBG 70 - 120: 0 units; CBG 121 - 150: 1 unit; CBG 151 - 200: 2 units; CBG 201 - 250: 3 units; CBG 251 - 300: 5 units; CBG 301 - 350: 7 units; CBG 351 - 400: 9 units; CBG > 400: call MD    insulin detemir (LEVEMIR) 100 UNIT/ML injection Inject 0.18 mLs (18 Units total) into the skin at bedtime. Qty: 10 mL, Refills: 0    lactulose (CHRONULAC) 10 GM/15ML solution Take 15 mLs (10 g total) by mouth 2 (two) times daily. Qty: 240 mL, Refills: 0    Magnesium Oxide 420 MG TABS Take 1 tablet by mouth daily.    Multiple Vitamin (MULTIVITAMIN WITH MINERALS) TABS tablet Take 1 tablet by mouth daily. Qty: 30 tablet, Refills: 2    pantoprazole (PROTONIX) 40 MG tablet Take 40 mg by mouth 2 (two) times daily.     polyvinyl alcohol (LIQUIFILM TEARS) 1.4 % ophthalmic solution Place 1 drop into both eyes daily as needed (dry eyes).     propranolol (INDERAL) 10 MG tablet Take 1 tablet (10 mg total) by mouth 2 (two) times daily. Qty: 60 tablet, Refills: 0    rifaximin (XIFAXAN) 550 MG TABS tablet Take 1 tablet (550 mg total) by mouth 2 (two) times daily. Qty: 60 tablet, Refills: 0    spironolactone (ALDACTONE) 50 MG tablet Take 1 tablet (50 mg total) by mouth daily. Qty: 30 tablet, Refills: 0       Allergies  Allergen Reactions  . Penicillins     Childhood Has received  Rocephin w/o reaction   Follow-up Information    Follow up with [facility].   Why:  Resume HH PT.    Contact information:   [address] [place] [address] [phone]       Follow up with [facility] On 04/17/2015.   Specialty:  General Practice   Why:  8:30 AM . WITH Grafton Folk PA, in GI clinic   Contact information:   [address] [place] [address] [phone]        The results of significant diagnostics from this hospitalization (including imaging, microbiology, ancillary and laboratory) are listed below for reference.    Significant Diagnostic Studies: Dg Chest 2 View  04/03/2015   CLINICAL DATA:  Shortness of breath.  EXAM: CHEST  2 VIEW  COMPARISON:  March 21, 2015.  FINDINGS: Hypoinflation of the lungs is noted. No pneumothorax or significant pleural effusion is noted. Stable bibasilar linear densities are noted most consistent with scarring or possibly atelectasis. Bony thorax is intact.  IMPRESSION: Hypoinflation of the lungs with stable bibasilar opacities consistent with scarring or subsegmental  atelectasis.   Electronically Signed   By: Lupita Raider, M.D.   On: 04/03/2015 15:05   Dg Chest 2 View  03/21/2015   CLINICAL DATA:  Chest pain, cough.  EXAM: CHEST  2 VIEW  COMPARISON:  February 12, 2015.  FINDINGS: The heart size and mediastinal contours are within normal limits. No pneumothorax or significant pleural effusion is noted right lung is clear. Grossly stable linear densities are noted in left lung base most consistent with scarring or subsegmental atelectasis. The visualized skeletal structures are unremarkable.  IMPRESSION: Grossly stable left basilar linear densities are noted most consistent with scarring or subsegmental atelectasis. No significant changes noted compared to prior exam.   Electronically Signed   By: Lupita Raider, M.D.   On: 03/21/2015 09:45   US Paracentesis  04/05/2015   INDICATION:  Hepatitis C, cirrhosis, recurrent ascites and request for paracentesis.  EXAM: ULTRASOUND-GUIDED PARACENTESIS  COMPARISON:  Paracentesis 04/04/15.  MEDICATIONS: None.  COMPLICATIONS: None immediate  TECHNIQUE: Informed written consent was obtained from the patient after a discussion of the risks, benefits and alternatives to treatment. A timeout was performed prior to the initiation of the procedure.  Initial ultrasound scanning demonstrates a large amount of ascites within the right lower abdominal quadrant. The right lower abdomen was prepped and draped in the usual sterile fashion. 1% lidocaine was used for local anesthesia.  Under direct ultrasound guidance, a 19 gauge, 7-cm, Yueh catheter was introduced. An ultrasound image was saved for documentation purposed. The paracentesis was performed. The catheter was removed and a dressing was applied. The patient tolerated the procedure well without immediate post procedural complication.  FINDINGS: A total of approximately 6.2 liters of cloudy yellow fluid was removed.  IMPRESSION: Successful ultrasound-guided paracentesis yielding 6.2 liters of peritoneal fluid.  Read By:  Pattricia Boss PA-C   Electronically Signed   By: Gilmer Mor D.O.   On: 04/05/2015 17:27   US Paracentesis  04/04/2015   INDICATION: Hepatitis C, cirrhosis, recurrent ascites and request for paracentesis.  EXAM: ULTRASOUND-GUIDED PARACENTESIS  COMPARISON:  Paracentesis 03/23/2015.  MEDICATIONS: None.  COMPLICATIONS: None immediate  TECHNIQUE: Informed written consent was obtained from the patient after a discussion of the risks, benefits and alternatives to treatment. A timeout was performed prior to the initiation of the procedure.  Initial ultrasound scanning demonstrates a large amount of ascites within the left lower abdominal quadrant. The left lower abdomen was prepped and draped in the usual sterile fashion. 1% lidocaine was used for local anesthesia.  Under direct ultrasound guidance, a  19 gauge, 7-cm, Yueh catheter was introduced. An ultrasound image was saved for documentation purposed. The paracentesis was performed. The catheter was removed and a dressing was applied. The patient tolerated the procedure well without immediate post procedural complication.  FINDINGS: A total of approximately 5 liters of cloudy yellow fluid was removed. Samples were sent to the laboratory as requested by the clinical team.  IMPRESSION: Successful ultrasound-guided paracentesis yielding 5 liters of peritoneal fluid.  Read By:  Pattricia Boss PA-C   Electronically Signed   By: Simonne Come M.D.   On: 04/04/2015 14:44   US Paracentesis  03/23/2015   INDICATION: Hepatitis C, cirrhosis, recurrent ascites and request for paracentesis.  EXAM: ULTRASOUND-GUIDED PARACENTESIS  COMPARISON:  Paracentesis 03/21/15.  MEDICATIONS: None.  COMPLICATIONS: None immediate  TECHNIQUE: Informed written consent was obtained from the patient after a discussion of the risks, benefits and alternatives to treatment. A timeout was performed prior to  the initiation of the procedure.  Initial ultrasound scanning demonstrates a large amount of ascites within the left lower abdominal quadrant. The left lower abdomen was prepped and draped in the usual sterile fashion. 1% lidocaine with epinephrine was used for local anesthesia.  Under direct ultrasound guidance, a 19 gauge, 10-cm, Yueh catheter was introduced. An ultrasound image was saved for documentation purposed. The paracentesis was performed. The catheter was removed and a dressing was applied. The patient tolerated the procedure well without immediate post procedural complication.  FINDINGS: A total of approximately 4.5 liters of serous fluid was removed.  IMPRESSION: Successful ultrasound-guided paracentesis yielding 4.5 liters of peritoneal fluid.  Read By:  Pattricia Boss PA-C   Electronically Signed   By: Gilmer Mor D.O.   On: 03/23/2015 12:12   US Paracentesis  03/21/2015    INDICATION: ascites  EXAM: ULTRASOUND-GUIDED PARACENTESIS  COMPARISON:  Previous paracentesis  MEDICATIONS: 10 cc 1% lidocaine  COMPLICATIONS: None immediate  TECHNIQUE: Informed written consent was obtained from the patient after a discussion of the risks, benefits and alternatives to treatment. A timeout was performed prior to the initiation of the procedure.  Initial ultrasound scanning demonstrates a large amount of ascites within the right lower abdominal quadrant. The right lower abdomen was prepped and draped in the usual sterile fashion. 1% lidocaine with epinephrine was used for local anesthesia. Under direct ultrasound guidance, a 19 gauge, 7-cm, Yueh catheter was introduced. An ultrasound image was saved for documentation purposed. The paracentesis was performed. The catheter was removed and a dressing was applied. The patient tolerated the procedure well without immediate post procedural complication.  FINDINGS: A total of approximately 7 liters of cloudy yellow fluid was removed. Samples were sent to the laboratory as requested by the clinical team.  IMPRESSION: Successful ultrasound-guided paracentesis yielding 7 liters of peritoneal fluid.  Read by:  Robet [facility]   Electronically Signed   By: Irish Lack M.D.   On: 03/21/2015 14:39    Microbiology: Recent Results (from the past 240 hour(s))  Culture, body fluid-bottle     Status: None (Preliminary result)   Collection Time: 04/04/15  2:09 PM  Result Value Ref Range Status   Specimen Description FLUID ASCITIC  Final   Special Requests NONE  Final   Culture NO GROWTH < 24 HOURS  Final   Report Status PENDING  Incomplete  Gram stain     Status: None   Collection Time: 04/04/15  2:09 PM  Result Value Ref Range Status   Specimen Description FLUID ASCITIC  Final   Special Requests NONE  Final   Gram Stain   Final    ABUNDANT WBC PRESENT,BOTH PMN AND MONONUCLEAR NO ORGANISMS SEEN    Report Status 04/04/2015 FINAL  Final      Labs: Basic Metabolic Panel:  Recent Labs Lab 04/03/15 1411 04/04/15 0503  NA 132* 133*  K 4.1 3.7  CL 102 101  CO2 22 25  GLUCOSE 307* 119*  BUN 8 10  CREATININE 0.93 1.17  CALCIUM 8.6* 8.1*   Liver Function Tests:  Recent Labs Lab 04/03/15 1411  AST 48*  ALT 25  ALKPHOS 175*  BILITOT 1.0  PROT 6.3*  ALBUMIN 2.0*    Recent Labs Lab 04/03/15 1411  LIPASE 35   No results for input(s): AMMONIA in the last 168 hours. CBC:  Recent Labs Lab 04/03/15 1411 04/04/15 0503  WBC 7.1 7.9  HGB 14.0 13.0  HCT 42.2 39.6  MCV 69.0*  68.6*  PLT 246 271   Cardiac Enzymes: No results for input(s): CKTOTAL, CKMB, CKMBINDEX, TROPONINI in the last 168 hours. BNP: BNP (last 3 results)  Recent Labs  09/19/14 1920 02/12/15 0907  BNP 29.8 19.0    ProBNP (last 3 results) No results for input(s): PROBNP in the last 8760 hours.  CBG:  Recent Labs Lab 04/05/15 0800 04/05/15 1312 04/05/15 1714 04/05/15 2214 04/06/15 0842  GLUCAP 91 189* 161* 127* 137*       Signed:  DHUNGEL, NISHANT  [facility] 04/06/2015, 10:42 AM

## 2015-04-06 NOTE — Progress Notes (Signed)
Spoke with April Alexander from [place] [phone] ext [phone] to update on clinical plan. If patient needs O2 for home use, contact Marijean Niemann RT at [phone] ext [phone], or pager [phone]. Will need RA and O2 Sat MD order, name and number of person who will be home to receive O2. Will also need time and address.

## 2015-04-06 NOTE — Care Management Note (Signed)
Case Management Note  Patient Details  Name: Curtis Estrada MRN: [id number] Date of Birth: 01-23-1956  Subjective/Objective:                  Date: 04-04-15 Wednesday Spoke with patient at the bedside. Introduced self as Sports coach and explained role in discharge planning and how to be reached. Verified patient lives in [place] in apartment on 14th floor on [address],, has DME cane walker two wheeled, oxygen. Expressed potential need for no other DME. Verified patient anticipates to go home alone, at time of discharge and will have part-time supervision by a girl who comes and cleans, 5 sisters, and friends at this time to best of his knowledge. Patient denied needing help with their medication, [facility] meds and sends them in the mail. Patient driven by [facility] transportation to MD appointments. Verified patient has PCP Dr Teodoro Spray at [facility].  Patient is active with [facility] for PT, If further HH needs arise in addition to PT call Kirkland Hun at [facility] to approve (see previous note for contact info).  Plan: CM will continue to follow for discharge planning and [facility] resources.   Lawerance Sabal RN BSN CM [phone]   Action/Plan:  Referral made to [facility] to resume HH PT, patient does not require home O2 per nurse [name], and [facility] notified of discharge and plan.   Expected Discharge Date:  04/05/15               Expected Discharge Plan:  Home w Home Health Services  In-House Referral:     Discharge planning Services  CM Consult  Post Acute Care Choice:    Choice offered to:     DME Arranged:    DME Agency:     HH Arranged:  PT HH Agency:  [facility]  Status of Service:  Completed, signed off  Medicare Important Message Given:    Date Medicare IM Given:    Medicare IM give by:    Date Additional Medicare IM Given:    Additional Medicare Important Message give by:     If discussed at Long Length of Stay Meetings, dates discussed:    Additional  Comments:  Lawerance Sabal, RN 04/06/2015, 11:37 AM

## 2015-04-06 NOTE — Discharge Instructions (Signed)
Ascites °Ascites is a gathering of fluid in the belly (abdomen). This is most often caused by liver disease. It may also be caused by a number of other less common problems. It causes a ballooning out (distension) of the abdomen. °CAUSES  °Scarring of the liver (cirrhosis) is the most common cause of ascites. Other causes include: °· Infection or inflammation in the abdomen. °· Cancer in the abdomen. °· Heart failure. °· Certain forms of kidney failure (nephritic syndrome). °· Inflammation of the pancreas. °· Clots in the veins of the liver. °SYMPTOMS  °In the early stages of ascites, you may not have any symptoms. The main symptom of ascites is a sense of abdominal bloating. This is due to the presence of fluid. This may also cause an increase in abdominal or waist size. People with this condition can develop swelling in the legs, and men can develop a swollen scrotum. When there is a lot of fluid, it may be hard to breath. Stretching of the abdomen by fluid can be painful. °DIAGNOSIS  °Certain features of your medical history, such as a history of liver disease and of an enlarging abdomen, can suggest the presence of ascites. The diagnosis of ascites can be made on physical exam by your caregiver. An abdominal ultrasound examination can confirm that ascites is present, and estimate the amount of fluid. °Once ascites is confirmed, it is important to determine its cause. Again, a history of one of the conditions listed in "CAUSES" provides a strong clue. A physical exam is important, and blood and X-ray tests may be needed. During a procedure called paracentesis, a sample of fluid is removed from the abdomen. This can determine certain key features about the fluid, such as whether or not infection or cancer is present. Your caregiver will determine if a paracentesis is necessary. They will describe the procedure to you. °PREVENTION  °Ascites is a complication of other conditions. Therefore to prevent ascites, you  must seek treatment for any significant health conditions you have. Once ascites is present, careful attention to fluid and salt intake may help prevent it from getting worse. If you have ascites, you should not drink alcohol. °PROGNOSIS  °The prognosis of ascites depends on the underlying disease. If the disease is reversible, such as with certain infections or with heart failure, then ascites may improve or disappear. When ascites is caused by cirrhosis, then it indicates that the liver disease has worsened, and further evaluation and treatment of the liver disease is needed. If your ascites is caused by cancer, then the success or failure of the cancer treatment will determine whether your ascites will improve or worsen. °RISKS AND COMPLICATIONS  °Ascites is likely to worsen if it is not properly diagnosed and treated. A large amount of ascites can cause pain and difficulty breathing. The main complication, besides worsening, is infection (called spontaneous bacterial peritonitis). This requires prompt treatment. °TREATMENT  °The treatment of ascites depends on its cause. When liver disease is your cause, medical management using water pills (diuretics) and decreasing salt intake is often effective. Ascites due to peritoneal inflammation or malignancy (cancer) alone does not respond to salt restriction and diuretics. Hospitalization is sometimes required. °If the treatment of ascites cannot be managed with medications, a number of other treatments are available. Your caregivers will help you decide which will work best for you. Some of these are: °· Removal of fluid from the abdomen (paracentesis). °· Fluid from the abdomen is passed into a vein (peritoneovenous shunting). °·   Liver transplantation. °· Transjugular intrahepatic portosystemic stent shunt. °HOME CARE INSTRUCTIONS  °It is important to monitor body weight and the intake and output of fluids. Weigh yourself at the same time every day. Record your  weights. Fluid restriction may be necessary. It is also important to know your salt intake. The more salt you take in, the more fluid you will retain. Ninety percent of people with ascites respond to this approach. °· Follow any directions for medicines carefully. °· Follow up with your caregiver, as directed. °· Report any changes in your health, especially any new or worsening symptoms. °· If your ascites is from liver disease, avoid alcohol and other substances toxic to the liver. °SEEK MEDICAL CARE IF:  °· Your weight increases more than a few pounds in a few days. °· Your abdominal or waist size increases. °· You develop swelling in your legs. °· You had swelling and it worsens. °SEEK IMMEDIATE MEDICAL CARE IF:  °· You develop a fever. °· You develop new abdominal pain. °· You develop difficulty breathing. °· You develop confusion. °· You have bleeding from the mouth, stomach, or rectum. °MAKE SURE YOU:  °· Understand these instructions. °· Will watch your condition. °· Will get help right away if you are not doing well or get worse. °Document Released: 06/30/2005 Document Revised: 09/22/2011 Document Reviewed: 01/29/2007 °[employer]® Patient Information ©2015 [employer]. This information is not intended to replace advice given to you by your health care provider. Make sure you discuss any questions you have with your health care provider. ° °

## 2015-04-09 LAB — CULTURE, BODY FLUID-BOTTLE: CULTURE: NO GROWTH

## 2015-04-09 LAB — CULTURE, BODY FLUID W GRAM STAIN -BOTTLE

## 2015-04-16 ENCOUNTER — Encounter (HOSPITAL_COMMUNITY): Payer: Self-pay | Admitting: Emergency Medicine

## 2015-04-16 ENCOUNTER — Inpatient Hospital Stay (HOSPITAL_COMMUNITY)
Admission: EM | Admit: 2015-04-16 | Discharge: 2015-04-21 | DRG: 441 | Disposition: A | Discharge status: Home / Self Care | Payer: Non-veteran care | Attending: Internal Medicine | Admitting: Internal Medicine

## 2015-04-16 ENCOUNTER — Inpatient Hospital Stay (HOSPITAL_COMMUNITY): Payer: Non-veteran care

## 2015-04-16 DIAGNOSIS — F1011 Alcohol abuse, in remission: Secondary | ICD-10-CM | POA: Diagnosis present

## 2015-04-16 DIAGNOSIS — Z794 Long term (current) use of insulin: Secondary | ICD-10-CM | POA: Diagnosis not present

## 2015-04-16 DIAGNOSIS — Z79899 Other long term (current) drug therapy: Secondary | ICD-10-CM | POA: Diagnosis not present

## 2015-04-16 DIAGNOSIS — Z880 Allergy status to penicillin: Secondary | ICD-10-CM

## 2015-04-16 DIAGNOSIS — G629 Polyneuropathy, unspecified: Secondary | ICD-10-CM

## 2015-04-16 DIAGNOSIS — E872 Acidosis, unspecified: Secondary | ICD-10-CM | POA: Diagnosis present

## 2015-04-16 DIAGNOSIS — I10 Essential (primary) hypertension: Secondary | ICD-10-CM | POA: Diagnosis present

## 2015-04-16 DIAGNOSIS — R0603 Acute respiratory distress: Secondary | ICD-10-CM

## 2015-04-16 DIAGNOSIS — K767 Hepatorenal syndrome: Secondary | ICD-10-CM | POA: Diagnosis present

## 2015-04-16 DIAGNOSIS — K219 Gastro-esophageal reflux disease without esophagitis: Secondary | ICD-10-CM | POA: Diagnosis present

## 2015-04-16 DIAGNOSIS — K7469 Other cirrhosis of liver: Secondary | ICD-10-CM | POA: Diagnosis not present

## 2015-04-16 DIAGNOSIS — N179 Acute kidney failure, unspecified: Secondary | ICD-10-CM | POA: Diagnosis present

## 2015-04-16 DIAGNOSIS — E118 Type 2 diabetes mellitus with unspecified complications: Secondary | ICD-10-CM | POA: Diagnosis present

## 2015-04-16 DIAGNOSIS — B1920 Unspecified viral hepatitis C without hepatic coma: Principal | ICD-10-CM | POA: Diagnosis present

## 2015-04-16 DIAGNOSIS — R0602 Shortness of breath: Secondary | ICD-10-CM

## 2015-04-16 DIAGNOSIS — R188 Other ascites: Secondary | ICD-10-CM | POA: Diagnosis present

## 2015-04-16 DIAGNOSIS — F17210 Nicotine dependence, cigarettes, uncomplicated: Secondary | ICD-10-CM | POA: Diagnosis present

## 2015-04-16 DIAGNOSIS — E1140 Type 2 diabetes mellitus with diabetic neuropathy, unspecified: Secondary | ICD-10-CM | POA: Diagnosis present

## 2015-04-16 DIAGNOSIS — K7460 Unspecified cirrhosis of liver: Secondary | ICD-10-CM | POA: Diagnosis present

## 2015-04-16 DIAGNOSIS — J988 Other specified respiratory disorders: Secondary | ICD-10-CM | POA: Diagnosis present

## 2015-04-16 DIAGNOSIS — F1010 Alcohol abuse, uncomplicated: Secondary | ICD-10-CM

## 2015-04-16 LAB — COMPREHENSIVE METABOLIC PANEL
ALT: 27 U/L (ref 17–63)
AST: 57 U/L — AB (ref 15–41)
Albumin: 2.3 g/dL — ABNORMAL LOW (ref 3.5–5.0)
Alkaline Phosphatase: 144 U/L — ABNORMAL HIGH (ref 38–126)
Anion gap: 8 (ref 5–15)
BILIRUBIN TOTAL: 2 mg/dL — AB (ref 0.3–1.2)
BUN: 12 mg/dL (ref 6–20)
CALCIUM: 8.4 mg/dL — AB (ref 8.9–10.3)
CO2: 26 mmol/L (ref 22–32)
Chloride: 99 mmol/L — ABNORMAL LOW (ref 101–111)
Creatinine, Ser: 1.25 mg/dL — ABNORMAL HIGH (ref 0.61–1.24)
GFR calc Af Amer: >60 mL/min (ref >60)
Glucose, Bld: 145 mg/dL — ABNORMAL HIGH (ref 65–99)
POTASSIUM: 3.5 mmol/L (ref 3.5–5.1)
Sodium: 133 mmol/L — ABNORMAL LOW (ref 135–145)
TOTAL PROTEIN: 6.6 g/dL (ref 6.5–8.1)

## 2015-04-16 LAB — URINALYSIS, ROUTINE W REFLEX MICROSCOPIC
Glucose, UA: NEGATIVE mg/dL
Hgb urine dipstick: NEGATIVE
KETONES UR: NEGATIVE mg/dL
Leukocytes, UA: NEGATIVE
NITRITE: NEGATIVE
PROTEIN: NEGATIVE mg/dL
SPECIFIC GRAVITY, URINE: 1.014 (ref 1.005–1.030)
UROBILINOGEN UA: 2 mg/dL — AB (ref 0.0–1.0)
pH: 5.5 (ref 5.0–8.0)

## 2015-04-16 LAB — LIPASE, BLOOD: Lipase: 40 U/L (ref 22–51)

## 2015-04-16 LAB — I-STAT CG4 LACTIC ACID, ED: LACTIC ACID, VENOUS: 2.21 mmol/L — AB (ref 0.5–2.0)

## 2015-04-16 LAB — BRAIN NATRIURETIC PEPTIDE: B NATRIURETIC PEPTIDE 5: 16.3 pg/mL (ref 0.0–100.0)

## 2015-04-16 LAB — CBC
HEMATOCRIT: 42.8 % (ref 39.0–52.0)
Hemoglobin: 14.3 g/dL (ref 13.0–17.0)
MCH: 22.9 pg — ABNORMAL LOW (ref 26.0–34.0)
MCHC: 33.4 g/dL (ref 30.0–36.0)
MCV: 68.5 fL — ABNORMAL LOW (ref 78.0–100.0)
PLATELETS: 290 10*3/uL (ref 150–400)
RBC: 6.25 MIL/uL — ABNORMAL HIGH (ref 4.22–5.81)
RDW: 21.4 % — AB (ref 11.5–15.5)
WBC: 6.8 10*3/uL (ref 4.0–10.5)

## 2015-04-16 LAB — ETHANOL: Alcohol, Ethyl (B): <5 mg/dL (ref <5)

## 2015-04-16 LAB — GLUCOSE, CAPILLARY: GLUCOSE-CAPILLARY: 146 mg/dL — AB (ref 65–99)

## 2015-04-16 LAB — AMMONIA: AMMONIA: 45 umol/L — AB (ref 9–35)

## 2015-04-16 LAB — BLOOD GAS, ARTERIAL
Acid-Base Excess: 0.1 mmol/L (ref 0.0–2.0)
Bicarbonate: 23.2 mEq/L (ref 20.0–24.0)
Drawn by: 36277
O2 CONTENT: 2 L/min
O2 Saturation: 91.5 %
PCO2 ART: 31.5 mmHg — AB (ref 35.0–45.0)
PH ART: 7.481 — AB (ref 7.350–7.450)
Patient temperature: 98.6
TCO2: 24.2 mmol/L (ref 0–100)
pO2, Arterial: 62 mmHg — ABNORMAL LOW (ref 80.0–100.0)

## 2015-04-16 LAB — LACTIC ACID, PLASMA: LACTIC ACID, VENOUS: 2 mmol/L (ref 0.5–2.0)

## 2015-04-16 LAB — D-DIMER, QUANTITATIVE: D-Dimer, Quant: 3.65 ug/mL-FEU — ABNORMAL HIGH (ref 0.00–0.48)

## 2015-04-16 MED ORDER — SODIUM CHLORIDE 0.9 % IJ SOLN
3.0000 mL | Freq: Two times a day (BID) | INTRAMUSCULAR | Status: DC
  Administered 2015-04-16 – 2015-04-21 (×9): 3 mL via INTRAVENOUS

## 2015-04-16 MED ORDER — INSULIN DETEMIR 100 UNIT/ML ~~LOC~~ SOLN
9.0000 [IU] | Freq: Every day | SUBCUTANEOUS | Status: DC
  Administered 2015-04-16 – 2015-04-20 (×5): 9 [IU] via SUBCUTANEOUS
  Filled 2015-04-16 (×7): qty 0.09

## 2015-04-16 MED ORDER — LACTULOSE 10 GM/15ML PO SOLN
10.0000 g | Freq: Two times a day (BID) | ORAL | Status: DC
  Administered 2015-04-16 – 2015-04-21 (×10): 10 g via ORAL
  Filled 2015-04-16 (×10): qty 15

## 2015-04-16 MED ORDER — PANTOPRAZOLE SODIUM 40 MG PO TBEC
40.0000 mg | DELAYED_RELEASE_TABLET | Freq: Two times a day (BID) | ORAL | Status: DC
  Administered 2015-04-16 – 2015-04-21 (×10): 40 mg via ORAL
  Filled 2015-04-16 (×10): qty 1

## 2015-04-16 MED ORDER — SODIUM CHLORIDE 0.9 % IJ SOLN: 3.0000 mL | INTRAMUSCULAR | Status: DC | PRN

## 2015-04-16 MED ORDER — FUROSEMIDE 10 MG/ML IJ SOLN
80.0000 mg | Freq: Two times a day (BID) | INTRAMUSCULAR | Status: DC
  Administered 2015-04-16 – 2015-04-17 (×2): 80 mg via INTRAVENOUS
  Filled 2015-04-16 (×2): qty 8

## 2015-04-16 MED ORDER — FOLIC ACID 1 MG PO TABS
1.0000 mg | ORAL_TABLET | Freq: Every day | ORAL | Status: DC
  Administered 2015-04-17 – 2015-04-21 (×5): 1 mg via ORAL
  Filled 2015-04-16 (×6): qty 1

## 2015-04-16 MED ORDER — GABAPENTIN 300 MG PO CAPS
600.0000 mg | ORAL_CAPSULE | Freq: Three times a day (TID) | ORAL | Status: DC
  Administered 2015-04-16 – 2015-04-21 (×12): 600 mg via ORAL
  Filled 2015-04-16 (×14): qty 2

## 2015-04-16 MED ORDER — PROPRANOLOL HCL 10 MG PO TABS
10.0000 mg | ORAL_TABLET | Freq: Two times a day (BID) | ORAL | Status: DC
  Administered 2015-04-16 – 2015-04-21 (×6): 10 mg via ORAL
  Filled 2015-04-16 (×13): qty 1

## 2015-04-16 MED ORDER — OXYCODONE HCL 5 MG PO TABS
5.0000 mg | ORAL_TABLET | ORAL | Status: DC | PRN
  Administered 2015-04-17 – 2015-04-18 (×2): 5 mg via ORAL
  Filled 2015-04-16 (×3): qty 1

## 2015-04-16 MED ORDER — SODIUM CHLORIDE 0.9 % IV SOLN: 250.0000 mL | INTRAVENOUS | Status: DC | PRN

## 2015-04-16 MED ORDER — SPIRONOLACTONE 25 MG PO TABS
50.0000 mg | ORAL_TABLET | Freq: Every day | ORAL | Status: DC
  Administered 2015-04-17: 50 mg via ORAL
  Filled 2015-04-16: qty 2

## 2015-04-16 MED ORDER — ONDANSETRON HCL 4 MG PO TABS: 4.0000 mg | ORAL_TABLET | Freq: Four times a day (QID) | ORAL | Status: DC | PRN

## 2015-04-16 MED ORDER — INSULIN ASPART 100 UNIT/ML ~~LOC~~ SOLN
0.0000 [IU] | Freq: Three times a day (TID) | SUBCUTANEOUS | Status: DC
  Administered 2015-04-17 – 2015-04-18 (×2): 2 [IU] via SUBCUTANEOUS
  Administered 2015-04-18: 3 [IU] via SUBCUTANEOUS
  Administered 2015-04-18: 5 [IU] via SUBCUTANEOUS
  Administered 2015-04-19: 3 [IU] via SUBCUTANEOUS
  Administered 2015-04-20: 5 [IU] via SUBCUTANEOUS
  Administered 2015-04-20: 2 [IU] via SUBCUTANEOUS
  Administered 2015-04-21: 3 [IU] via SUBCUTANEOUS
  Administered 2015-04-21: 5 [IU] via SUBCUTANEOUS

## 2015-04-16 MED ORDER — BENZONATATE 100 MG PO CAPS: 200.0000 mg | ORAL_CAPSULE | Freq: Two times a day (BID) | ORAL | Status: DC | PRN

## 2015-04-16 MED ORDER — MAGNESIUM OXIDE 400 (241.3 MG) MG PO TABS
400.0000 mg | ORAL_TABLET | Freq: Every day | ORAL | Status: DC
  Administered 2015-04-17 – 2015-04-21 (×5): 400 mg via ORAL
  Filled 2015-04-16 (×6): qty 1

## 2015-04-16 MED ORDER — HEPARIN SODIUM (PORCINE) 5000 UNIT/ML IJ SOLN
5000.0000 [IU] | Freq: Three times a day (TID) | INTRAMUSCULAR | Status: DC
  Administered 2015-04-16 – 2015-04-21 (×13): 5000 [IU] via SUBCUTANEOUS
  Filled 2015-04-16 (×13): qty 1

## 2015-04-16 MED ORDER — ONDANSETRON HCL 4 MG/2ML IJ SOLN: 4.0000 mg | Freq: Four times a day (QID) | INTRAMUSCULAR | Status: DC | PRN

## 2015-04-16 MED ORDER — RIFAXIMIN 550 MG PO TABS
550.0000 mg | ORAL_TABLET | Freq: Two times a day (BID) | ORAL | Status: DC
  Administered 2015-04-16 – 2015-04-21 (×10): 550 mg via ORAL
  Filled 2015-04-16 (×11): qty 1

## 2015-04-16 MED ORDER — SODIUM CHLORIDE 0.9 % IJ SOLN
3.0000 mL | Freq: Two times a day (BID) | INTRAMUSCULAR | Status: DC
  Administered 2015-04-17 – 2015-04-20 (×6): 3 mL via INTRAVENOUS

## 2015-04-16 MED ORDER — POLYVINYL ALCOHOL 1.4 % OP SOLN
1.0000 [drp] | Freq: Every day | OPHTHALMIC | Status: DC | PRN
  Filled 2015-04-16: qty 15

## 2015-04-16 NOTE — ED Notes (Signed)
Pt presents with abdominal swelling. Just discharged from hospital last week after having 12lbs of fluid drained. Reports his abdomen has been swelling since then. Current weight 198.2lb

## 2015-04-16 NOTE — H&P (Addendum)
[facility] History and Physical  Curtis Estrada ZOX:[id number] DOB: 10-31-1955 DOA: 04/16/2015  Referring physician: Urban Gibson PA - [facility] PCP: [facility]   Chief Complaint: SOB  HPI: Curtis Estrada is a 59 y.o. male   She reports feeling well at time of discharge. Patient was discharged from the hospital for decompensated hepatic cirrhosis on 04/06/2015.Marland Kitchen Patient states that partially 3 days after going home he began feeling short of breath and noticed he was swelling more in his abdomen and lower extremity. Patient states he's been compliant with his new medical regimen since discharge. States he's unable to exactly which pills he takes but states that he feels his pillbox every week by reading the label on his bottles and takes them accordingly. Patient states his last alcoholic beverages one month ago. States he had bloody diarrhea 1 since discharge but currently is without change in bowel habits,. Ongoing cough for 5 months that is no worse today. Symptoms are constant and getting worse. Denies any chest pain, fevers, abdominal pain, confusion, headache, neck stiffness, dysuria, rash.   Review of Systems:  Constitutional:  No weight loss, night sweats,  HEENT:  No headaches, Difficulty swallowing,Tooth/dental problems,Sore throat,  No sneezing, itching, ear ache, nasal congestion, post nasal drip,  Cardio-vascular:  No chest pain, Orthopnea, dizziness, palpitations  GI: Per HPi Resp: Per HPi Skin:  no rash or lesions.  GU:  no dysuria, change in color of urine, no urgency or frequency. No flank pain.  Musculoskeletal:   No joint pain or swelling. No decreased range of motion. No back pain.  Psych:  No change in mood or affect. No depression or anxiety. No memory loss.   Past Medical History  Diagnosis Date  . Hypertension   . 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 ETOH abuse   . Ascites 01/2013  . Hematoma 01/2013    posterior right flank from a  fall  . Coagulopathy (HCC) 01/2013    secondary to liver disease.   . Anemia 02/2013  . Hyponatremia 01/2013  . Unspecified constipation 04/04/2013  . H pylori ulcer 03/05/2013  . Gastric ulcer with hemorrhage 03/03/2013  . Chronic wound of extremity-right great toe 03/03/2013  . Acute blood loss anemia 03/03/2013  . Spontaneous bacterial peritonitis (HCC) 09/20/2014  . Hyperlipidemia   . Pneumonia ? 09/2014; 02/2015  . Chronic bronchitis (HCC)   . GERD (gastroesophageal reflux disease)   . Daily headache   . Arthritis     "shoulders" (03/21/2015)  . Type II diabetes mellitus (HCC)     uncontrolled  . Hepatitis C    Past Surgical History  Procedure Laterality Date  . Tonsillectomy    . Esophagogastroduodenoscopy N/A 03/03/2013    Procedure: ESOPHAGOGASTRODUODENOSCOPY (EGD);  Surgeon: Beverley Fiedler, MD;  Location: [facility] ENDOSCOPY;  Service: Gastroenterology;  Laterality: N/A;  Bedside  . Inguinal hernia repair Right 1960's  . Paracentesis  09/2014; 02/2015; 03/21/2015   Social History:  reports that he has been smoking Cigarettes.  He has a 4.7 pack-year smoking history. He has never used smokeless tobacco. He reports that he drinks alcohol. He reports that he does not use illicit drugs.  Allergies  Allergen Reactions  . Penicillins     Childhood Has received Rocephin w/o reaction    Family History  Problem Relation Age of Onset  . Dementia Mother   . Cancer - Other Father      Prior to Admission medications  Medication Sig Start Date End Date Taking? Authorizing Provider  albuterol (PROVENTIL HFA;VENTOLIN HFA) 108 (90 BASE) MCG/ACT inhaler Inhale 2 puffs into the lungs every 2 (two) hours as needed for wheezing or shortness of breath (or coughing). 05/30/14   Dione Booze, MD  folic acid (FOLVITE) 1 MG tablet Take 1 tablet (1 mg total) by mouth daily. 09/21/14   Maryruth Bun Rama, MD  furosemide (LASIX) 80 MG tablet Take 1 tablet (80 mg total) by mouth 2 (two) times daily. 02/19/15   Catarina Hartshorn,  MD  gabapentin (NEURONTIN) 300 MG capsule Take 600 mg by mouth 3 (three) times daily.    Historical Provider, MD  insulin aspart (NOVOLOG) 100 UNIT/ML injection Inject 0-6 Units into the skin 3 (three) times daily with meals. 0-6 Units, Subcutaneous, 3 times daily with meals  CBG < 70: implement hypoglycemia protocol; CBG 70 - 120: 0 units; CBG 121 - 150: 1 unit; CBG 151 - 200: 2 units; CBG 201 - 250: 3 units; CBG 251 - 300: 5 units; CBG 301 - 350: 7 units; CBG 351 - 400: 9 units; CBG > 400: call MD 03/10/13   Maretta Bees, MD  insulin detemir (LEVEMIR) 100 UNIT/ML injection Inject 0.18 mLs (18 Units total) into the skin at bedtime. 02/19/15   Catarina Hartshorn, MD  lactulose (CHRONULAC) 10 GM/15ML solution Take 15 mLs (10 g total) by mouth 2 (two) times daily. 03/24/15   Maryann Mikhail, DO  Magnesium Oxide 420 MG TABS Take 1 tablet by mouth daily.    Historical Provider, MD  Multiple Vitamin (MULTIVITAMIN WITH MINERALS) TABS tablet Take 1 tablet by mouth daily. 09/21/14   Christina P Rama, MD  pantoprazole (PROTONIX) 40 MG tablet Take 40 mg by mouth 2 (two) times daily.     Historical Provider, MD  polyvinyl alcohol (LIQUIFILM TEARS) 1.4 % ophthalmic solution Place 1 drop into both eyes daily as needed (dry eyes).     Historical Provider, MD  propranolol (INDERAL) 10 MG tablet Take 1 tablet (10 mg total) by mouth 2 (two) times daily. 02/19/15   Catarina Hartshorn, MD  rifaximin (XIFAXAN) 550 MG TABS tablet Take 1 tablet (550 mg total) by mouth 2 (two) times daily. 03/24/15   Maryann Mikhail, DO  spironolactone (ALDACTONE) 50 MG tablet Take 1 tablet (50 mg total) by mouth daily. 03/24/15   Edsel Petrin, DO   Physical Exam: Filed Vitals:   04/16/15 1830 04/16/15 1900 04/16/15 1928 04/16/15 1955  BP: 124/82 122/100  132/97  Pulse: 124 121  123  Temp:   98 F (36.7 C) 97.7 F (36.5 C)  TempSrc:   Oral Oral  Resp: Height:      Weight:      SpO2: 96% 98%  94%    Wt Readings from Last 3 Encounters:    04/16/15 89.903 kg (198 lb 3.2 oz)  04/06/15 77.928 kg (171 lb 12.8 oz)  03/24/15 82.3 kg (181 lb 7 oz)    General: Ill appearing but comfortable Eyes: Scleral icterus.  ENT: Dry mm Neck:  no LAD, masses or thyromegaly Cardiovascular: II/VI systolic murmur,2+ LE edema Telemetry:  SR, no arrhythmias  Respiratory: Crackles throughout lung bases, nml effort.  Abdomen: Marked distention, hypoactive bowel sounds, nontender to palpation. Skin:  no rash or induration seen on limited exam Musculoskeletal:  grossly normal tone BUE/BLE Psychiatric:  grossly normal mood and affect, speech fluent and appropriate Neurologic:  grossly non-focal.  Labs on Admission:  Basic Metabolic Panel:  Recent Labs Lab 04/16/15 1549  NA 133*  K 3.5  CL 99*  CO2 26  GLUCOSE 145*  BUN 12  CREATININE 1.25*  CALCIUM 8.4*   Liver Function Tests:  Recent Labs Lab 04/16/15 1549  AST 57*  ALT 27  ALKPHOS 144*  BILITOT 2.0*  PROT 6.6  ALBUMIN 2.3*    Recent Labs Lab 04/16/15 1549  LIPASE 40    Recent Labs Lab 04/16/15 1813  AMMONIA 45*   CBC:  Recent Labs Lab 04/16/15 1549  WBC 6.8  HGB 14.3  HCT 42.8  MCV 68.5*  PLT 290   Cardiac Enzymes: No results for input(s): CKTOTAL, CKMB, CKMBINDEX, TROPONINI in the last 168 hours.  BNP (last 3 results)  Recent Labs  09/19/14 1920 02/12/15 0907  BNP 29.8 19.0    ProBNP (last 3 results) No results for input(s): PROBNP in the last 8760 hours.  CBG: No results for input(s): GLUCAP in the last 168 hours.  Radiological Exams on Admission: Dg Chest Portable 1 View  04/16/2015   CLINICAL DATA:  Abdominal swelling. Paracentesis last week. Subsequent encounter.  EXAM: PORTABLE CHEST 1 VIEW  COMPARISON:  Chest radiographs 04/03/2015 and 03/21/2015.  FINDINGS: 1849 hours. The degree of inspiration is improved compared with the prior study. There is improved linear atelectasis at both lung bases. No edema, confluent airspace  opacity or significant pleural effusion identified. The heart size and mediastinal contours are stable.  IMPRESSION: Interval improved basilar aeration and atelectasis. No acute findings.   Electronically Signed   By: Carey Bullocks M.D.   On: 04/16/2015 19:14     Assessment/Plan Active Problems:   Decompensated HCV cirrhosis (HCC)   History of alcohol abuse   AKI (acute kidney injury) (HCC)   DM (diabetes mellitus) with complications (HCC)   GERD without esophagitis   Peripheral neuropathy (HCC)   Decompensated hepatic cirrhosis: Patient being readmitted for nearly identical complaints. (Previous admission 9/20-9/23/2016). MELD score 16. Pt decompensating for past 7 days including 27lb wt gain. No evidence of SBP at this time, despite elevated lactic acid (2.21). Unsure if pt totally compliant w/ home regimen. Last ETOH >73mo ago - Tele - US guided paracentesis (NPO after midnight) - Lasix 80 IV BID (home 80 PO BID) - continue home spironolactone - body fluid studies ordered - continue lactulose, Xifaxan, propranolol - ETOH - vitamins - coags - BCX  Respiratory decompensation: New O2 requirement, tachycardic, nml respiratory effort and rate, lactic acid 2.21. CXR w/o evidence of acute process. Last Echo 09/2014 w/ 55% EF and Grade 1 diastolic dysfunction - D-dimer w/ f/u CTA if + - ABG - repeat Lactic acid.  - BNP  AKI: Cr 1.25, baseline 0.9. Likely from hepatorenal syndrome and increased diuresis from recent hospitalization. Anticipate worsening due to diuresis.  - BMET in am  DM: A1c 8.4 on 03/2015 - continue 1/2 levemir dose (18 units at home) QHS - SSI  Neuropathy: - contionue gabapentin  GERD: - continue ppi   Code Status: FULL  DVT Prophylaxis: Hep Family Communication: Pt Disposition Plan: pending improvement     Marionna Gonia Shela Commons, MD Family Medicine [facility] [email or web address] Password [email or web address]

## 2015-04-16 NOTE — Progress Notes (Signed)
Utilization Review Completed.  

## 2015-04-16 NOTE — Progress Notes (Signed)
Pt arrived to unit alert and oriented x4. Oriented to room, unit, and staff.  Bed in lowest position and call bell is within reach. Will continue to monitor. 

## 2015-04-16 NOTE — ED Provider Notes (Signed)
CSN: [id number]     Arrival date & time 04/16/15  1445 History   First MD Initiated Contact with Patient 04/16/15 1721     Chief Complaint  Patient presents with  . Ascites    The history is provided by the patient and medical records.     59 year old male with history of hep C/alcoholic cirrhosis, coagulopathy, peptic ulcer disease, SBP, diabetes, anemia, hyponatremia, chronic bronchitis presenting with abdominal distention and shortness of breath. Onset about 2 weeks ago. Gradually worsening since then. Now severe. Described as bloating and fullness and abdomen with diffuse abdominal pain and shortness of breath with cough. Symptoms worsened by coughing or by exertion or lying flat. Similar to prior presentation here 2 weeks ago, when patient was admitted and had large volume paracentesis. Patient's weight is up 27 pounds since that discharge. Denies fevers. Having bowel movements. States has been compliant with meds since discharge. Was due for appt with GI clinic in [place] tomorrow but they called pt today and told pt they would be unable to drain his ascites, and based on his description of his current sx recommended presenting to ED for eval and tx.    Past Medical History  Diagnosis Date  . Hypertension   . 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 ETOH abuse   . Ascites 01/2013  . Hematoma 01/2013    posterior right flank from a fall  . Coagulopathy (HCC) 01/2013    secondary to liver disease.   . Anemia 02/2013  . Hyponatremia 01/2013  . Unspecified constipation 04/04/2013  . H pylori ulcer 03/05/2013  . Gastric ulcer with hemorrhage 03/03/2013  . Chronic wound of extremity-right great toe 03/03/2013  . Acute blood loss anemia 03/03/2013  . Spontaneous bacterial peritonitis (HCC) 09/20/2014  . Hyperlipidemia   . Pneumonia ? 09/2014; 02/2015  . Chronic bronchitis (HCC)   . GERD (gastroesophageal reflux disease)   . Daily headache   . Arthritis     "shoulders" (03/21/2015)   . Type II diabetes mellitus (HCC)     uncontrolled  . Hepatitis C    Past Surgical History  Procedure Laterality Date  . Tonsillectomy    . Esophagogastroduodenoscopy N/A 03/03/2013    Procedure: ESOPHAGOGASTRODUODENOSCOPY (EGD);  Surgeon: Beverley Fiedler, MD;  Location: [facility] ENDOSCOPY;  Service: Gastroenterology;  Laterality: N/A;  Bedside  . Inguinal hernia repair Right 1960's  . Paracentesis  09/2014; 02/2015; 03/21/2015   Family History  Problem Relation Age of Onset  . Dementia Mother   . Cancer - Other Father    Social History  Substance Use Topics  . Smoking status: Current Every Day Smoker -- 0.10 packs/day for 47 years    Types: Cigarettes  . Smokeless tobacco: Never Used  . Alcohol Use: Yes     Comment: 03/21/2015 "last drink was 02/15/2015)    Review of Systems  Constitutional: Negative for fever.  HENT: Negative for rhinorrhea.   Eyes: Negative for visual disturbance.  Respiratory: Positive for cough and shortness of breath.   Cardiovascular: Positive for leg swelling. Negative for chest pain.  Gastrointestinal: Positive for abdominal pain and abdominal distention. Negative for vomiting, diarrhea and constipation.  Genitourinary: Negative for decreased urine volume.  Skin: Negative for rash.  Allergic/Immunologic: Negative for immunocompromised state.  Neurological: Negative for syncope.  Psychiatric/Behavioral: Negative for confusion.      Allergies  Penicillins  Home Medications   Prior to Admission medications   Medication Sig Start Date  End Date Taking? Authorizing Provider  albuterol (PROVENTIL HFA;VENTOLIN HFA) 108 (90 BASE) MCG/ACT inhaler Inhale 2 puffs into the lungs every 2 (two) hours as needed for wheezing or shortness of breath (or coughing). 05/30/14   Dione Booze, MD  folic acid (FOLVITE) 1 MG tablet Take 1 tablet (1 mg total) by mouth daily. 09/21/14   Maryruth Bun Rama, MD  furosemide (LASIX) 80 MG tablet Take 1 tablet (80 mg total) by mouth 2 (two)  times daily. 02/19/15   Catarina Hartshorn, MD  gabapentin (NEURONTIN) 300 MG capsule Take 600 mg by mouth 3 (three) times daily.    Historical Provider, MD  insulin aspart (NOVOLOG) 100 UNIT/ML injection Inject 0-6 Units into the skin 3 (three) times daily with meals. 0-6 Units, Subcutaneous, 3 times daily with meals  CBG < 70: implement hypoglycemia protocol; CBG 70 - 120: 0 units; CBG 121 - 150: 1 unit; CBG 151 - 200: 2 units; CBG 201 - 250: 3 units; CBG 251 - 300: 5 units; CBG 301 - 350: 7 units; CBG 351 - 400: 9 units; CBG > 400: call MD 03/10/13   Maretta Bees, MD  insulin detemir (LEVEMIR) 100 UNIT/ML injection Inject 0.18 mLs (18 Units total) into the skin at bedtime. 02/19/15   Catarina Hartshorn, MD  lactulose (CHRONULAC) 10 GM/15ML solution Take 15 mLs (10 g total) by mouth 2 (two) times daily. 03/24/15   Maryann Mikhail, DO  Magnesium Oxide 420 MG TABS Take 1 tablet by mouth daily.    Historical Provider, MD  Multiple Vitamin (MULTIVITAMIN WITH MINERALS) TABS tablet Take 1 tablet by mouth daily. 09/21/14   Christina P Rama, MD  pantoprazole (PROTONIX) 40 MG tablet Take 40 mg by mouth 2 (two) times daily.     Historical Provider, MD  polyvinyl alcohol (LIQUIFILM TEARS) 1.4 % ophthalmic solution Place 1 drop into both eyes daily as needed (dry eyes).     Historical Provider, MD  propranolol (INDERAL) 10 MG tablet Take 1 tablet (10 mg total) by mouth 2 (two) times daily. 02/19/15   Catarina Hartshorn, MD  rifaximin (XIFAXAN) 550 MG TABS tablet Take 1 tablet (550 mg total) by mouth 2 (two) times daily. 03/24/15   Maryann Mikhail, DO  spironolactone (ALDACTONE) 50 MG tablet Take 1 tablet (50 mg total) by mouth daily. 03/24/15   Maryann Mikhail, DO   BP 120/92 mmHg  Pulse 123  Temp(Src) 97.6 F (36.4 C) (Oral)  Resp 19  Ht  (1.702 m)  Wt 198 lb 3.2 oz (89.903 kg)  BMI 31.04 kg/m2  SpO2 90% Physical Exam  Constitutional: He is oriented to person, place, and time. He appears well-developed and well-nourished. No  distress.  HENT:  Head: Normocephalic and atraumatic.  Eyes: Right eye exhibits no discharge. Left eye exhibits no discharge.  Neck: No tracheal deviation present.  Cardiovascular: Regular rhythm.  Tachycardia present.   Pulmonary/Chest: Effort normal. No respiratory distress. He has rales.  Abdominal: Soft. He exhibits distension (severely distended and firm in entire abd, no tympany ). There is tenderness.  Musculoskeletal: He exhibits no edema.  Neurological: He is alert and oriented to person, place, and time. Coordination normal.  Mild asterixis  Skin: Skin is warm and dry.  Psychiatric: He has a normal mood and affect. His behavior is normal.    ED Course  Procedures (including critical care time) Labs Review Labs Reviewed  COMPREHENSIVE METABOLIC PANEL - Abnormal; Notable for the following:    Sodium 133 (*)  Chloride 99 (*)    Glucose, Bld 145 (*)    Creatinine, Ser 1.25 (*)    Calcium 8.4 (*)    Albumin 2.3 (*)    AST 57 (*)    Alkaline Phosphatase 144 (*)    Total Bilirubin 2.0 (*)    All other components within normal limits  CBC - Abnormal; Notable for the following:    RBC 6.25 (*)    MCV 68.5 (*)    MCH 22.9 (*)    RDW 21.4 (*)    All other components within normal limits  URINALYSIS, ROUTINE W REFLEX MICROSCOPIC (NOT AT [facility]) - Abnormal; Notable for the following:    Color, Urine AMBER (*)    APPearance CLOUDY (*)    Bilirubin Urine SMALL (*)    Urobilinogen, UA 2.0 (*)    All other components within normal limits  AMMONIA - Abnormal; Notable for the following:    Ammonia 45 (*)    All other components within normal limits  LACTIC ACID, PLASMA - Abnormal; Notable for the following:    Lactic Acid, Venous 2.7 (*)    All other components within normal limits  BLOOD GAS, ARTERIAL - Abnormal; Notable for the following:    pH, Arterial 7.481 (*)    pCO2 arterial 31.5 (*)    pO2, Arterial 62.0 (*)    All other components within normal limits  D-DIMER,  QUANTITATIVE (NOT AT [facility]) - Abnormal; Notable for the following:    D-Dimer, Quant 3.65 (*)    All other components within normal limits  GLUCOSE, CAPILLARY - Abnormal; Notable for the following:    Glucose-Capillary 146 (*)    All other components within normal limits  GLUCOSE, CAPILLARY - Abnormal; Notable for the following:    Glucose-Capillary 273 (*)    All other components within normal limits  I-STAT CG4 LACTIC ACID, ED - Abnormal; Notable for the following:    Lactic Acid, Venous 2.21 (*)    All other components within normal limits  CULTURE, BLOOD (ROUTINE X 2)  CULTURE, BLOOD (ROUTINE X 2)  LIPASE, BLOOD  ETHANOL  LACTIC ACID, PLASMA  BRAIN NATRIURETIC PEPTIDE  APTT  PROTIME-INR  I-STAT CG4 LACTIC ACID, ED    Imaging Review Dg Chest Portable 1 View  04/16/2015   CLINICAL DATA:  Abdominal swelling. Paracentesis last week. Subsequent encounter.  EXAM: PORTABLE CHEST 1 VIEW  COMPARISON:  Chest radiographs 04/03/2015 and 03/21/2015.  FINDINGS: 1849 hours. The degree of inspiration is improved compared with the prior study. There is improved linear atelectasis at both lung bases. No edema, confluent airspace opacity or significant pleural effusion identified. The heart size and mediastinal contours are stable.  IMPRESSION: Interval improved basilar aeration and atelectasis. No acute findings.   Electronically Signed   By: Carey Bullocks M.D.   On: 04/16/2015 19:14   I have personally reviewed and evaluated these images and lab results as part of my medical decision-making.   EKG Interpretation None      MDM   Final diagnoses:  Ascites  Respiratory distress  Lactic acidosis  Decompensated cirrhosis related to hepatitis C virus (HCV) (HCC)    59 year old male with history of hep C/alcoholic cirrhosis, coagulopathy, peptic ulcer disease, SBP, diabetes, anemia, hyponatremia, chronic bronchitis presenting with abdominal distention and shortness of breath. 27 lbs up since  prior discharge after large volume paracentesis with albumin administration. Unable to obtain outpatient clinic care. AF, VSS though borderline o2 sats 90-92% at rest. Found to  have lactic acidosis. Less concerned for SBP in setting of gradual onset, mild diffuse pain, no fever, well appearance. Admitted for further care.   Case discussed with Dr. Clayborne Dana who oversaw management of this pt.    Urban Gibson, MD 04/17/15 1610  Marily Memos, MD 04/18/15 [phone]

## 2015-04-17 ENCOUNTER — Inpatient Hospital Stay (HOSPITAL_COMMUNITY): Payer: Non-veteran care

## 2015-04-17 LAB — GLUCOSE, CAPILLARY
Glucose-Capillary: 273 mg/dL — ABNORMAL HIGH (ref 65–99)
Glucose-Capillary: 83 mg/dL (ref 65–99)
Glucose-Capillary: 78 mg/dL (ref 65–99)
Glucose-Capillary: 139 mg/dL — ABNORMAL HIGH (ref 65–99)
Glucose-Capillary: 221 mg/dL — ABNORMAL HIGH (ref 65–99)

## 2015-04-17 LAB — BODY FLUID CELL COUNT WITH DIFFERENTIAL
LYMPHS FL: 92 %
Monocyte-Macrophage-Serous Fluid: 8 % — ABNORMAL LOW (ref 50–90)
WBC FLUID: 235 uL (ref 0–1000)

## 2015-04-17 LAB — APTT: APTT: 39 s — AB (ref 24–37)

## 2015-04-17 LAB — LACTATE DEHYDROGENASE, PLEURAL OR PERITONEAL FLUID: LD, Fluid: 48 U/L — ABNORMAL HIGH (ref 3–23)

## 2015-04-17 LAB — PROTIME-INR
INR: 1.4 (ref 0.00–1.49)
Prothrombin Time: 17.3 seconds — ABNORMAL HIGH (ref 11.6–15.2)

## 2015-04-17 LAB — GRAM STAIN

## 2015-04-17 LAB — LACTIC ACID, PLASMA: LACTIC ACID, VENOUS: 2.7 mmol/L — AB (ref 0.5–2.0)

## 2015-04-17 MED ORDER — SODIUM CHLORIDE 0.9 % IV BOLUS (SEPSIS)
500.0000 mL | Freq: Once | INTRAVENOUS | Status: AC
  Administered 2015-04-17: 500 mL via INTRAVENOUS

## 2015-04-17 MED ORDER — ADULT MULTIVITAMIN W/MINERALS CH
1.0000 | ORAL_TABLET | Freq: Every day | ORAL | Status: DC
  Administered 2015-04-17 – 2015-04-21 (×5): 1 via ORAL
  Filled 2015-04-17 (×5): qty 1

## 2015-04-17 MED ORDER — LIDOCAINE HCL (PF) 1 % IJ SOLN
INTRAMUSCULAR | Status: AC
  Administered 2015-04-17: 13:00:00
  Filled 2015-04-17: qty 10

## 2015-04-17 MED ORDER — FUROSEMIDE 80 MG PO TABS
120.0000 mg | ORAL_TABLET | Freq: Two times a day (BID) | ORAL | Status: DC
  Administered 2015-04-18 – 2015-04-19 (×3): 120 mg via ORAL
  Filled 2015-04-17 (×8): qty 1

## 2015-04-17 MED ORDER — SPIRONOLACTONE 25 MG PO TABS
50.0000 mg | ORAL_TABLET | Freq: Two times a day (BID) | ORAL | Status: DC
  Administered 2015-04-18 – 2015-04-21 (×5): 50 mg via ORAL
  Filled 2015-04-17 (×6): qty 2

## 2015-04-17 MED ORDER — ALBUTEROL SULFATE (2.5 MG/3ML) 0.083% IN NEBU: 2.0000 mL | INHALATION_SOLUTION | RESPIRATORY_TRACT | Status: DC | PRN

## 2015-04-17 NOTE — Progress Notes (Signed)
CRITICAL VALUE ALERT  Critical value received:  Lactic Acid 2.7  Date of notification:  04/17/15  Time of notification:  0105  Critical value read back:Yes.    Nurse who received alert:  Myrene Buddy RN  MD notified (1st page):  Merdis Delay MD  Time of first page:  0129  MD notified (2nd page): Merdis Delay MD  Time of second page: 0151  Responding MD:  Merdis Delay MD  Time MD responded:  [phone]

## 2015-04-17 NOTE — Progress Notes (Signed)
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 PMN count ok. Change lasix to po. Increase from outpatient dose (though compliance unclear), increase spironolactone to bid. Monitor lytes and creat   History of alcohol abuse: sober for several months   AKI (acute kidney injury) (HCC): monitor   DM (diabetes mellitus) with complications (HCC)   Lactic acidosis: monitor. No evidence of infection   Code Status:  full Family Communication:  Pt is lucid Disposition Plan:  Home when stable  Consultants:    Procedures:     Antibiotics:    HPI/Subjective: No cough, f/c. Feels better after paracentesis  Objective: Filed Vitals:   04/17/15 1316  BP: 107/67  Pulse: 98  Temp:   Resp:     Intake/Output Summary (Last 24 hours) at 04/17/15 1330 Last data filed at 04/17/15 0133  Gross per 24 hour  Intake    240 ml  Output    650 ml  Net   -410 ml   Filed Weights   04/16/15 1523 04/16/15 1955 04/17/15 0509  Weight: 89.903 kg (198 lb 3.2 oz) 87.8 kg (193 lb 9 oz) 87.9 kg (193 lb 12.6 oz)    Exam:   General:  Weak appearing. Oriented and appropriate  Cardiovascular: RRR without MGR  Respiratory: CTA without WRR  Abdomen: soft. nontender  Ext: 1+ edema  Basic Metabolic Panel:  Recent Labs Lab 04/16/15 1549  NA 133*  K 3.5  CL 99*  CO2 26  GLUCOSE 145*  BUN 12  CREATININE 1.25*  CALCIUM 8.4*   Liver Function Tests:  Recent Labs Lab 04/16/15 1549  AST 57*  ALT 27  ALKPHOS 144*  BILITOT 2.0*  PROT 6.6  ALBUMIN 2.3*    Recent Labs Lab 04/16/15 1549  LIPASE 40    Recent Labs Lab 04/16/15 1813  AMMONIA 45*   CBC:  Recent Labs Lab 04/16/15 1549  WBC 6.8  HGB 14.3   HCT 42.8  MCV 68.5*  PLT 290   Cardiac Enzymes: No results for input(s): CKTOTAL, CKMB, CKMBINDEX, TROPONINI in the last 168 hours. BNP (last 3 results)  Recent Labs  09/19/14 1920 02/12/15 0907 04/16/15 2101  BNP 29.8 19.0 16.3    ProBNP (last 3 results) No results for input(s): PROBNP in the last 8760 hours.  CBG:  Recent Labs Lab 04/16/15 2129 04/16/15 2301 04/17/15 0745 04/17/15 1212  GLUCAP 146* 273* 83 78    Recent Results (from the past 240 hour(s))  Culture, blood (routine x 2)     Status: None (Preliminary result)   Collection Time: 04/16/15  9:01 PM  Result Value Ref Range Status   Specimen Description BLOOD RIGHT ANTECUBITAL  Final   Special Requests BOTTLES DRAWN AEROBIC AND ANAEROBIC 10CC EA  Final   Culture NO GROWTH < 24 HOURS  Final   Report Status PENDING  Incomplete  Culture, blood (routine x 2)     Status: None (Preliminary result)   Collection Time: 04/16/15  9:09 PM  Result Value Ref Range Status   Specimen Description BLOOD RIGHT HAND  Final   Special Requests IN PEDIATRIC BOTTLE 1.5CC  Final   Culture NO GROWTH < 24 HOURS  Final   Report Status PENDING  Incomplete     Studies: US  Paracentesis  04/17/2015   INDICATION: Cirrhosis, hepatitis-C, recurrent ascites. Request is made for diagnostic and therapeutic paracentesis.  EXAM: ULTRASOUND-GUIDED DIAGNOSTIC AND THERAPEUTIC PARACENTESIS  COMPARISON:  Prior paracentesis on 04/05/2015  MEDICATIONS: None.  COMPLICATIONS: None immediate  TECHNIQUE: Informed written consent was obtained from the patient after a discussion of the risks, benefits and alternatives to treatment. A timeout was performed prior to the initiation of the procedure.  Initial ultrasound scanning demonstrates a large amount of ascites within the left lower abdominal quadrant. The left lower abdomen was prepped and draped in the usual sterile fashion. 1% lidocaine was used for local anesthesia. Under direct ultrasound guidance, a  19 gauge, 10-cm, Yueh catheter was introduced. An ultrasound image was saved for documentation purposed. The paracentesis was performed. The catheter was removed and a dressing was applied. The patient tolerated the procedure well without immediate post procedural complication.  FINDINGS: A total of approximately 9 liters of turbid, light yellow fluid was removed. Samples were sent to the laboratory as requested by the clinical team.  IMPRESSION: Successful ultrasound-guided diagnostic and therapeutic paracentesis yielding 9 liters of peritoneal fluid.  Read by: Jeananne Rama, PA-C   Electronically Signed   By: Gilmer Mor D.O.   On: 04/17/2015 12:32   Dg Chest Portable 1 View  04/16/2015   CLINICAL DATA:  Abdominal swelling. Paracentesis last week. Subsequent encounter.  EXAM: PORTABLE CHEST 1 VIEW  COMPARISON:  Chest radiographs 04/03/2015 and 03/21/2015.  FINDINGS: 1849 hours. The degree of inspiration is improved compared with the prior study. There is improved linear atelectasis at both lung bases. No edema, confluent airspace opacity or significant pleural effusion identified. The heart size and mediastinal contours are stable.  IMPRESSION: Interval improved basilar aeration and atelectasis. No acute findings.   Electronically Signed   By: Carey Bullocks M.D.   On: 04/16/2015 19:14    Scheduled Meds: . folic acid  1 mg Oral Daily  . furosemide  120 mg Oral BID  . gabapentin  600 mg Oral TID  . heparin  5,000 Units Subcutaneous 3 times per day  . insulin aspart  0-15 Units Subcutaneous TID WC  . insulin detemir  9 Units Subcutaneous QHS  . lactulose  10 g Oral BID  . magnesium oxide  400 mg Oral Daily  . multivitamin with minerals  1 tablet Oral Daily  . pantoprazole  40 mg Oral BID  . propranolol  10 mg Oral BID  . rifaximin  550 mg Oral BID  . sodium chloride  3 mL Intravenous Q12H  . sodium chloride  3 mL Intravenous Q12H  . [START ON 04/18/2015] spironolactone  50 mg Oral BID    Continuous Infusions:   Time spent: 25 minutes  [name]  [facility] [email or web address], password [facility] 04/17/2015, 1:30 PM  LOS: 1 day

## 2015-04-17 NOTE — Progress Notes (Signed)
On called [facility] NP return paged about patient bp 92/62 orders to hold inderal  and only give 60 of lasix po

## 2015-04-17 NOTE — Progress Notes (Signed)
RN received critical lab value. Pt is in no distress and VSS.  MD paged/ordered NS 500 cc bolus.  RN will continue to monitor. Lesly Dukes, RN

## 2015-04-17 NOTE — Care Management Note (Addendum)
Case Management Note  Patient Details  Name: Curtis Estrada MRN: [id number] Date of Birth: 05-12-1956  Subjective/Objective:                Copied from previous admission: "Date: 04-04-15 Wednesday Spoke with patient at the bedside. Introduced self as Sports coach and explained role in discharge planning and how to be reached. Verified patient lives in [place] in apartment on [address]th floor on [address],, has DME cane walker two wheeled, oxygen. Expressed potential need for no other DME. Verified patient anticipates to go home alone, at time of discharge and will have part-time supervision by a girl who comes and cleans, 5 sisters, and friends at this time to best of his knowledge. Patient denied needing help with their medication, [facility] meds and sends them in the mail. Patient driven by [employer] transportation to MD appointments. Verified patient has PCP Dr Jeri Lager at [facility].  Patient is active with [facility] for PT, If further HH needs arise in addition to PT call Kirkland Hun at [facility] to approve (see previous note for contact info).  Plan: CM will continue to follow for discharge planning and [facility] resources.   Lawerance Sabal RN BSN CM [phone]"  04-17-15 Spoke with Tresa Endo at [facility] ext. [phone]. Patient also has tub bench at home and is still current with [facility] for [facility] PT. Kirkland Hun is patient's SW through [place] [phone].     Action/Plan:   Expected Discharge Date:                  Expected Discharge Plan:     In-House Referral:     Discharge planning Services     Post Acute Care Choice:    Choice offered to:     DME Arranged:    DME Agency:     HH Arranged:    HH Agency:     Status of Service:     Medicare Important Message Given:    Date Medicare IM Given:    Medicare IM give by:    Date Additional Medicare IM Given:    Additional Medicare Important Message give by:     If discussed at Long Length of Stay Meetings, dates discussed:    Additional  Comments:  Lawerance Sabal, RN 04/17/2015, 9:30 AM

## 2015-04-17 NOTE — Procedures (Signed)
Ultrasound-guided diagnostic and therapeutic paracentesis performed yielding 9 L of turbid, light yellow fluid. A portion of the fluid was submitted to the laboratory for preordered studies. No immediate complications.

## 2015-04-17 NOTE — Progress Notes (Signed)
Received report from [facility] in ED regarding the transfer of pt for [phone]. Lesly Dukes, RN

## 2015-04-18 ENCOUNTER — Inpatient Hospital Stay (HOSPITAL_COMMUNITY): Payer: Non-veteran care

## 2015-04-18 LAB — COMPREHENSIVE METABOLIC PANEL
ALK PHOS: 106 U/L (ref 38–126)
ALT: 21 U/L (ref 17–63)
ANION GAP: 8 (ref 5–15)
AST: 49 U/L — ABNORMAL HIGH (ref 15–41)
Albumin: 1.7 g/dL — ABNORMAL LOW (ref 3.5–5.0)
BUN: 10 mg/dL (ref 6–20)
CALCIUM: 8.2 mg/dL — AB (ref 8.9–10.3)
CO2: 27 mmol/L (ref 22–32)
CREATININE: 1.3 mg/dL — AB (ref 0.61–1.24)
Chloride: 99 mmol/L — ABNORMAL LOW (ref 101–111)
GFR, EST NON AFRICAN AMERICAN: 58 mL/min — AB (ref >60)
Glucose, Bld: 156 mg/dL — ABNORMAL HIGH (ref 65–99)
Potassium: 4 mmol/L (ref 3.5–5.1)
SODIUM: 134 mmol/L — AB (ref 135–145)
Total Bilirubin: 1.3 mg/dL — ABNORMAL HIGH (ref 0.3–1.2)
Total Protein: 5.6 g/dL — ABNORMAL LOW (ref 6.5–8.1)

## 2015-04-18 LAB — GLUCOSE, CAPILLARY
Glucose-Capillary: 207 mg/dL — ABNORMAL HIGH (ref 65–99)
Glucose-Capillary: 186 mg/dL — ABNORMAL HIGH (ref 65–99)
Glucose-Capillary: 141 mg/dL — ABNORMAL HIGH (ref 65–99)
Glucose-Capillary: 161 mg/dL — ABNORMAL HIGH (ref 65–99)

## 2015-04-18 LAB — LACTIC ACID, PLASMA: LACTIC ACID, VENOUS: 2.5 mmol/L — AB (ref 0.5–2.0)

## 2015-04-18 LAB — PATHOLOGIST SMEAR REVIEW: PATH REVIEW: REACTIVE

## 2015-04-18 MED ORDER — TECHNETIUM TC 99M DIETHYLENETRIAME-PENTAACETIC ACID: 40.5000 | Freq: Once | INTRAVENOUS | Status: DC | PRN

## 2015-04-18 MED ORDER — TECHNETIUM TO 99M ALBUMIN AGGREGATED
6.2000 | Freq: Once | INTRAVENOUS | Status: AC | PRN
  Administered 2015-04-18: 6 via INTRAVENOUS

## 2015-04-18 NOTE — Progress Notes (Signed)
[facility]                                                                              Patient Demographics  Curtis Estrada, is a 59 y.o. male, DOB - [birth year], ZOX:[id number]  Admit date - 04/16/2015   Admitting Physician Ozella Rocks, MD  Outpatient Primary MD for the patient is [facility]  LOS - 2   Chief Complaint  Patient presents with  . Ascites      HPI on 04/16/2015 by Dr. Shelly Flatten Curtis Estrada is a 59 y.o. Male. hhe reports feeling well at time of discharge. Patient was discharged from the hospital for decompensated hepatic cirrhosis on 04/06/2015.Curtis Estrada Patient states that partially 3 days after going home he began feeling short of breath and noticed he was swelling more in his abdomen and lower extremity. Patient states he's been compliant with his new medical regimen since discharge. States he's unable to exactly which pills he takes but states that he feels his pillbox every week by reading the label on his bottles and takes them accordingly. Patient states his last alcoholic beverages one month ago. States he had bloody diarrhea 1 since discharge but currently is without change in bowel habits,. Ongoing cough for 5 months that is no worse today. Symptoms are constant and getting worse. Denies any chest pain, fevers, abdominal pain, confusion, headache, neck stiffness, dysuria, rash.  Assessment & Plan   Decompensated hepatic cirrhosis -Patient was readmitted for nearly identical complaints. (Previous admission 9/20-9/23/2016). MELD score 16. Pt decompensating for past 7 days including 27lb wt gain. No evidence of SBP at this time, despite elevated lactic acid (2.21). Unsure if pt totally compliant w/ home regimen. Last ETOH >12mo ago -US guided paracentesis 04/17/2015, 9L removed -Continue Lasix, spironolactone, lactulose, Xifaxan, propranolol -Blood and fluid cultures show no growth to date  Respiratory decompensation:  -Improving, will wean off of  supplemental O2 -Likely secondary to ascites/volume overload -CXR w/o evidence of acute process.  -Last Echo 09/2014 w/ 55% EF and Grade 1 diastolic dysfunction -DDimer was elevated, 3.65 -Given AKI, will obtain VQ scan.  -repeat Lactic acid 2.5 (appears to be consistently elevated) -BNP 16.3  AKI -Baseline creatinine 0.9, currently 1.3.  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 Anticipate worsening due to diuresis.  -Continue to monitor BMP  Diabetes mellitus with neuropathy -A1c 8.4 on 03/2015 -Continue Lantus, ISS, CBG monitoring  -Continue gabapentin  GERD -continue PPI  Code Status: Full  Family Communication: None at bedside  Disposition Plan: Admitted.   Time Spent in minutes   30 minutes  Procedures  US guided paracentesis  Consults   None  DVT Prophylaxis  Heparin  Lab Results  Component Value Date   PLT 290 04/16/2015    Medications  Scheduled Meds: . folic acid  1 mg Oral Daily  . furosemide  120 mg Oral BID  . gabapentin  600 mg Oral TID  . heparin  5,000 Units Subcutaneous 3 times per day  . insulin aspart  0-15 Units Subcutaneous TID WC  . insulin detemir  9 Units Subcutaneous QHS  . lactulose  10 g Oral BID  .  magnesium oxide  400 mg Oral Daily  . multivitamin with minerals  1 tablet Oral Daily  . pantoprazole  40 mg Oral BID  . propranolol  10 mg Oral BID  . rifaximin  550 mg Oral BID  . sodium chloride  3 mL Intravenous Q12H  . sodium chloride  3 mL Intravenous Q12H  . spironolactone  50 mg Oral BID   Continuous Infusions:  PRN Meds:.sodium chloride, albuterol, benzonatate, ondansetron **OR** ondansetron (ZOFRAN) IV, oxyCODONE, polyvinyl alcohol, sodium chloride  Antibiotics    Anti-infectives    Start     Dose/Rate Route Frequency Ordered Stop   04/16/15 2300  rifaximin (XIFAXAN) tablet 550 mg     550 mg Oral 2 times daily 04/16/15 1956        Subjective:   [name] seen and  examined today. Patient states he is not feeling well.  Eventhough he had 9L removed from his abdomen yesterday, he feels there is more.  Denies N/V, SOB, Chest pain at this time.   Objective:   Filed Vitals:   04/17/15 2113 04/18/15 0500 04/18/15 0611 04/18/15 0830  BP: 92/63  94/67 96/64  Pulse: 101  107 106  Temp: 98.4 F (36.9 C)  98.2 F (36.8 C) 99.3 F (37.4 C)  TempSrc: Oral  Oral Oral  Resp: 16  16   Height:      Weight:  81.7 kg (180 lb 1.9 oz)    SpO2: 95%  96% 96%    Wt Readings from Last 3 Encounters:  04/18/15 81.7 kg (180 lb 1.9 oz)  04/06/15 77.928 kg (171 lb 12.8 oz)  03/24/15 82.3 kg (181 lb 7 oz)     Intake/Output Summary (Last 24 hours) at 04/18/15 1150 Last data filed at 04/18/15 1001  Gross per 24 hour  Intake      0 ml  Output   1900 ml  Net  -1900 ml    Exam  General: Well developed, well nourished, NAD, appears stated age  HEENT: NCAT, mucous membranes moist. Scleral icterus  Cardiovascular: S1 S2 auscultated, RRR, 2/6SEM  Respiratory: Clear to auscultation bilaterally with equal chest rise  Abdomen: Soft, nontender, distended, + bowel sounds  Extremities: warm dry without cyanosis clubbing. +1 LE edema B/L  Neuro: AAOx3, nonfocal  Psych: Normal affect and demeanor    Data Review   Micro Results Recent Results (from the past 240 hour(s))  Culture, blood (routine x 2)     Status: None (Preliminary result)   Collection Time: 04/16/15  9:01 PM  Result Value Ref Range Status   Specimen Description BLOOD RIGHT ANTECUBITAL  Final   Special Requests BOTTLES DRAWN AEROBIC AND ANAEROBIC 10CC   Final   Culture NO GROWTH 2 DAYS  Final   Report Status PENDING  Incomplete  Culture, blood (routine x 2)     Status: None (Preliminary result)   Collection Time: 04/16/15  9:09 PM  Result Value Ref Range Status   Specimen Description BLOOD RIGHT HAND  Final   Special Requests IN PEDIATRIC BOTTLE 1.5CC  Final   Culture NO GROWTH 2 DAYS  Final    Report Status PENDING  Incomplete  Culture, body fluid-bottle     Status: None (Preliminary result)   Collection Time: 04/17/15 10:53 AM  Result Value Ref Range Status   Specimen Description FLUID PERITONEAL  Final   Special Requests BOTTLES DRAWN AEROBIC AND ANAEROBIC 10CC  Final   Culture NO GROWTH < 24 HOURS  Final  Report Status PENDING  Incomplete  Gram stain     Status: None   Collection Time: 04/17/15 10:53 AM  Result Value Ref Range Status   Specimen Description FLUID PERITONEAL  Final   Special Requests NONE  Final   Gram Stain   Final    RARE WBC PRESENT, PREDOMINANTLY MONONUCLEAR NO ORGANISMS SEEN GRAM STAIN REVIEWED-AGREE WITH RESULT SYARBOROUGH    Report Status 04/17/2015 FINAL  Final    Radiology Reports Dg Chest 2 View  04/03/2015   CLINICAL DATA:  Shortness of breath.  EXAM: CHEST  2 VIEW  COMPARISON:  March 21, 2015.  FINDINGS: Hypoinflation of the lungs is noted. No pneumothorax or significant pleural effusion is noted. Stable bibasilar linear densities are noted most consistent with scarring or possibly atelectasis. Bony thorax is intact.  IMPRESSION: Hypoinflation of the lungs with stable bibasilar opacities consistent with scarring or subsegmental atelectasis.   Electronically Signed   By: Lupita Raider, M.D.   On: 04/03/2015 15:05   Dg Chest 2 View  03/21/2015   CLINICAL DATA:  Chest pain, cough.  EXAM: CHEST  2 VIEW  COMPARISON:  February 12, 2015.  FINDINGS: The heart size and mediastinal contours are within normal limits. No pneumothorax or significant pleural effusion is noted right lung is clear. Grossly stable linear densities are noted in left lung base most consistent with scarring or subsegmental atelectasis. The visualized skeletal structures are unremarkable.  IMPRESSION: Grossly stable left basilar linear densities are noted most consistent with scarring or subsegmental atelectasis. No significant changes noted compared to prior exam.   Electronically  Signed   By: Lupita Raider, M.D.   On: 03/21/2015 09:45   US Paracentesis  04/17/2015   INDICATION: Cirrhosis, hepatitis-C, recurrent ascites. Request is made for diagnostic and therapeutic paracentesis.  EXAM: ULTRASOUND-GUIDED DIAGNOSTIC AND THERAPEUTIC PARACENTESIS  COMPARISON:  Prior paracentesis on 04/05/2015  MEDICATIONS: None.  COMPLICATIONS: None immediate  TECHNIQUE: Informed written consent was obtained from the patient after a discussion of the risks, benefits and alternatives to treatment. A timeout was performed prior to the initiation of the procedure.  Initial ultrasound scanning demonstrates a large amount of ascites within the left lower abdominal quadrant. The left lower abdomen was prepped and draped in the usual sterile fashion. 1% lidocaine was used for local anesthesia. Under direct ultrasound guidance, a 19 gauge, 10-cm, Yueh catheter was introduced. An ultrasound image was saved for documentation purposed. The paracentesis was performed. The catheter was removed and a dressing was applied. The patient tolerated the procedure well without immediate post procedural complication.  FINDINGS: A total of approximately 9 liters of turbid, light yellow fluid was removed. Samples were sent to the laboratory as requested by the clinical team.  IMPRESSION: Successful ultrasound-guided diagnostic and therapeutic paracentesis yielding 9 liters of peritoneal fluid.  Read by: Jeananne Rama, PA-C   Electronically Signed   By: Gilmer Mor D.O.   On: 04/17/2015 12:32   US Paracentesis  04/05/2015   INDICATION: Hepatitis C, cirrhosis, recurrent ascites and request for paracentesis.  EXAM: ULTRASOUND-GUIDED PARACENTESIS  COMPARISON:  Paracentesis 04/04/15.  MEDICATIONS: None.  COMPLICATIONS: None immediate  TECHNIQUE: Informed written consent was obtained from the patient after a discussion of the risks, benefits and alternatives to treatment. A timeout was performed prior to the initiation of the  procedure.  Initial ultrasound scanning demonstrates a large amount of ascites within the right lower abdominal quadrant. The right lower abdomen was prepped and draped in the  usual sterile fashion. 1% lidocaine was used for local anesthesia.  Under direct ultrasound guidance, a 19 gauge, 7-cm, Yueh catheter was introduced. An ultrasound image was saved for documentation purposed. The paracentesis was performed. The catheter was removed and a dressing was applied. The patient tolerated the procedure well without immediate post procedural complication.  FINDINGS: A total of approximately 6.2 liters of cloudy yellow fluid was removed.  IMPRESSION: Successful ultrasound-guided paracentesis yielding 6.2 liters of peritoneal fluid.  Read By:  Pattricia Boss PA-C   Electronically Signed   By: Gilmer Mor D.O.   On: 04/05/2015 17:27   US Paracentesis  04/04/2015   INDICATION: Hepatitis C, cirrhosis, recurrent ascites and request for paracentesis.  EXAM: ULTRASOUND-GUIDED PARACENTESIS  COMPARISON:  Paracentesis 03/23/2015.  MEDICATIONS: None.  COMPLICATIONS: None immediate  TECHNIQUE: Informed written consent was obtained from the patient after a discussion of the risks, benefits and alternatives to treatment. A timeout was performed prior to the initiation of the procedure.  Initial ultrasound scanning demonstrates a large amount of ascites within the left lower abdominal quadrant. The left lower abdomen was prepped and draped in the usual sterile fashion. 1% lidocaine was used for local anesthesia.  Under direct ultrasound guidance, a 19 gauge, 7-cm, Yueh catheter was introduced. An ultrasound image was saved for documentation purposed. The paracentesis was performed. The catheter was removed and a dressing was applied. The patient tolerated the procedure well without immediate post procedural complication.  FINDINGS: A total of approximately 5 liters of cloudy yellow fluid was removed. Samples were sent to the  laboratory as requested by the clinical team.  IMPRESSION: Successful ultrasound-guided paracentesis yielding 5 liters of peritoneal fluid.  Read By:  Pattricia Boss PA-C   Electronically Signed   By: Simonne Come M.D.   On: 04/04/2015 14:44   US Paracentesis  03/23/2015   INDICATION: Hepatitis C, cirrhosis, recurrent ascites and request for paracentesis.  EXAM: ULTRASOUND-GUIDED PARACENTESIS  COMPARISON:  Paracentesis 03/21/15.  MEDICATIONS: None.  COMPLICATIONS: None immediate  TECHNIQUE: Informed written consent was obtained from the patient after a discussion of the risks, benefits and alternatives to treatment. A timeout was performed prior to the initiation of the procedure.  Initial ultrasound scanning demonstrates a large amount of ascites within the left lower abdominal quadrant. The left lower abdomen was prepped and draped in the usual sterile fashion. 1% lidocaine with epinephrine was used for local anesthesia.  Under direct ultrasound guidance, a 19 gauge, 10-cm, Yueh catheter was introduced. An ultrasound image was saved for documentation purposed. The paracentesis was performed. The catheter was removed and a dressing was applied. The patient tolerated the procedure well without immediate post procedural complication.  FINDINGS: A total of approximately 4.5 liters of serous fluid was removed.  IMPRESSION: Successful ultrasound-guided paracentesis yielding 4.5 liters of peritoneal fluid.  Read By:  Pattricia Boss PA-C   Electronically Signed   By: Gilmer Mor D.O.   On: 03/23/2015 12:12   US Paracentesis  03/21/2015   INDICATION: ascites  EXAM: ULTRASOUND-GUIDED PARACENTESIS  COMPARISON:  Previous paracentesis  MEDICATIONS: 10 cc 1% lidocaine  COMPLICATIONS: None immediate  TECHNIQUE: Informed written consent was obtained from the patient after a discussion of the risks, benefits and alternatives to treatment. A timeout was performed prior to the initiation of the procedure.  Initial ultrasound  scanning demonstrates a large amount of ascites within the right lower abdominal quadrant. The right lower abdomen was prepped and draped in the usual sterile fashion. 1% lidocaine  with epinephrine was used for local anesthesia. Under direct ultrasound guidance, a 19 gauge, 7-cm, Yueh catheter was introduced. An ultrasound image was saved for documentation purposed. The paracentesis was performed. The catheter was removed and a dressing was applied. The patient tolerated the procedure well without immediate post procedural complication.  FINDINGS: A total of approximately 7 liters of cloudy yellow fluid was removed. Samples were sent to the laboratory as requested by the clinical team.  IMPRESSION: Successful ultrasound-guided paracentesis yielding 7 liters of peritoneal fluid.  Read by:  Robet Leu [facility]   Electronically Signed   By: Irish Lack M.D.   On: 03/21/2015 14:39   Dg Chest Portable 1 View  04/16/2015   CLINICAL DATA:  Abdominal swelling. Paracentesis last week. Subsequent encounter.  EXAM: PORTABLE CHEST 1 VIEW  COMPARISON:  Chest radiographs 04/03/2015 and 03/21/2015.  FINDINGS: 1849 hours. The degree of inspiration is improved compared with the prior study. There is improved linear atelectasis at both lung bases. No edema, confluent airspace opacity or significant pleural effusion identified. The heart size and mediastinal contours are stable.  IMPRESSION: Interval improved basilar aeration and atelectasis. No acute findings.   Electronically Signed   By: Carey Bullocks M.D.   On: 04/16/2015 19:14    CBC  Recent Labs Lab 04/16/15 1549  WBC 6.8  HGB 14.3  HCT 42.8  PLT 290  MCV 68.5*  MCH 22.9*  MCHC 33.4  RDW 21.4*    Chemistries   Recent Labs Lab 04/16/15 1549 04/18/15 0542  NA 133* 134*  K 3.5 4.0  CL 99* 99*  CO2 26 27  GLUCOSE 145* 156*  BUN 12 10  CREATININE 1.25* 1.30*  CALCIUM 8.4* 8.2*  AST 57* 49*  ALT 27 21  ALKPHOS 144* 106  BILITOT 2.0* 1.3*    ------------------------------------------------------------------------------------------------------------------ estimated creatinine clearance is 61 mL/min (by C-G formula based on Cr of 1.3). ------------------------------------------------------------------------------------------------------------------ No results for input(s): HGBA1C in the last 72 hours. ------------------------------------------------------------------------------------------------------------------ No results for input(s): CHOL, HDL, LDLCALC, TRIG, CHOLHDL, LDLDIRECT in the last 72 hours. ------------------------------------------------------------------------------------------------------------------ No results for input(s): TSH, T4TOTAL, T3FREE, THYROIDAB in the last 72 hours.  Invalid input(s): FREET3 ------------------------------------------------------------------------------------------------------------------ No results for input(s): VITAMINB12, FOLATE, FERRITIN, TIBC, IRON, RETICCTPCT in the last 72 hours.  Coagulation profile  Recent Labs Lab 04/17/15 0616  INR 1.40     Recent Labs  04/16/15 2101  DDIMER 3.65*    Cardiac Enzymes No results for input(s): CKMB, TROPONINI, MYOGLOBIN in the last 168 hours.  Invalid input(s): CK ------------------------------------------------------------------------------------------------------------------ Invalid input(s): POCBNP    Damonie Ellenwood D.O. on 04/18/2015 at 11:50 AM  Between 7am to 7pm - Pager - [phone]  After 7pm go to [email or web address] - password [email or web address]  And look for the night coverage person covering for me after hours  [facility] Office  [phone]

## 2015-04-18 NOTE — Progress Notes (Addendum)
PAged [employer] [facility] twice to seek assistance with arranging transport to GI follow up apt. Also spoke with April Lyn Hollingshead was directed to call [facility] ext [phone] no answer. Left message with Ferd Hibbs, to help with transportation to appointment.   Addendum- Spoke with Victorino Dike at 15:45. She stated that patient may be able to get local disabled [facility] van, and would need to be WC dependent to qualify for their [facility] transportation program. We spoke about thi s may apply to patient as he is not able to functionally ambulate when needing paracentesis, and he will continually need these on an ongoing outpatient basis to prevent readmission. She stated will work on getting this approved through his PMD Dr Jeri Lager. CM also put in a SW consult for patient to have medicaid transportation as a back up.   04-20-15 Spoke with Ms Health and safety inspector and transportation. Faced order etc. To [facility] for wheelchair. She states it may not be delivered for a few days as their offices are closed Monday for [place].  She states that medicaid does not provide transportation to the [place], patient would have to get transportation to the [facility] that transports from area close to airport and patient would have to get transportation to [facility] center. She stated that she will speak with [facility] and explain his situation so that they may be able to come to get him at his house. Patient given her contact information to call her and arrange transportation. Patient also instructed by this CM that had to have a back up plan for transportation that he needed top start working on today for his appointment on 04-24-15.

## 2015-04-18 NOTE — Progress Notes (Signed)
CRITICAL VALUE ALERT  Critical value received:  Lactic acid 2.5  Date of notification:  04/18/15  Time of notification:  0743  Critical value read back:Yes.    Nurse who received alert:  Leane Platt  MD notified (1st page):  MD Catha Gosselin   Time of first page:  [phone]

## 2015-04-18 NOTE — Consult Note (Signed)
Pat Sires EdD 

## 2015-04-18 NOTE — Progress Notes (Signed)
Per [employer] [facility] [place], patient does not require F2F for [facility] order. Placed HH order for [facility] RN and PT. Faxed orders to [facility] at [phone].

## 2015-04-19 ENCOUNTER — Inpatient Hospital Stay (HOSPITAL_COMMUNITY): Payer: Non-veteran care

## 2015-04-19 LAB — COMPREHENSIVE METABOLIC PANEL
ALBUMIN: 1.7 g/dL — AB (ref 3.5–5.0)
ALT: 24 U/L (ref 17–63)
ANION GAP: 9 (ref 5–15)
AST: 50 U/L — AB (ref 15–41)
Alkaline Phosphatase: 112 U/L (ref 38–126)
BILIRUBIN TOTAL: 1.3 mg/dL — AB (ref 0.3–1.2)
BUN: 10 mg/dL (ref 6–20)
CHLORIDE: 94 mmol/L — AB (ref 101–111)
CO2: 27 mmol/L (ref 22–32)
Calcium: 7.9 mg/dL — ABNORMAL LOW (ref 8.9–10.3)
Creatinine, Ser: 1.2 mg/dL (ref 0.61–1.24)
GFR calc Af Amer: >60 mL/min (ref >60)
GFR calc non Af Amer: >60 mL/min (ref >60)
GLUCOSE: 129 mg/dL — AB (ref 65–99)
POTASSIUM: 3.9 mmol/L (ref 3.5–5.1)
SODIUM: 130 mmol/L — AB (ref 135–145)
TOTAL PROTEIN: 5.4 g/dL — AB (ref 6.5–8.1)

## 2015-04-19 LAB — BODY FLUID CELL COUNT WITH DIFFERENTIAL
EOS FL: 0 %
LYMPHS FL: 62 %
Monocyte-Macrophage-Serous Fluid: 37 % — ABNORMAL LOW (ref 50–90)
NEUTROPHIL FLUID: 1 % (ref 0–25)
Total Nucleated Cell Count, Fluid: 481 cu mm (ref 0–1000)

## 2015-04-19 LAB — CBC
HEMATOCRIT: 40.7 % (ref 39.0–52.0)
Hemoglobin: 13.4 g/dL (ref 13.0–17.0)
MCH: 22.6 pg — ABNORMAL LOW (ref 26.0–34.0)
MCHC: 32.9 g/dL (ref 30.0–36.0)
MCV: 68.8 fL — ABNORMAL LOW (ref 78.0–100.0)
PLATELETS: 266 10*3/uL (ref 150–400)
RBC: 5.92 MIL/uL — ABNORMAL HIGH (ref 4.22–5.81)
RDW: 21.3 % — AB (ref 11.5–15.5)
WBC: 7.4 10*3/uL (ref 4.0–10.5)

## 2015-04-19 LAB — GRAM STAIN

## 2015-04-19 LAB — GLUCOSE, CAPILLARY
Glucose-Capillary: 120 mg/dL — ABNORMAL HIGH (ref 65–99)
Glucose-Capillary: 111 mg/dL — ABNORMAL HIGH (ref 65–99)
Glucose-Capillary: 153 mg/dL — ABNORMAL HIGH (ref 65–99)
Glucose-Capillary: 157 mg/dL — ABNORMAL HIGH (ref 65–99)

## 2015-04-19 IMAGING — CT CT ABD-PELV W/ CM
2 of 5 series · 16 of 46 positions shown, 18 images · IV contrast (omnipaque)
Comparison: 08/10/2012

CLINICAL DATA: Right sided hematoma and decreased hemoglobin after
fall.

CT ABDOMEN AND PELVIS WITH CONTRAST
TECHNIQUE: Multidetector CT imaging of the abdomen and pelvis was
performed following the standard protocol during bolus
administration of intravenous contrast.
Contrast: 100mL OMNIPAQUE IOHEXOL 300 MG/ML  SOLN

[Series 2: routine · axial · 0.78mm/px · z∈[+617,+1037]mm · 13 of 96 slices shown, 15 images]
[im 6/96  soft-tissue]
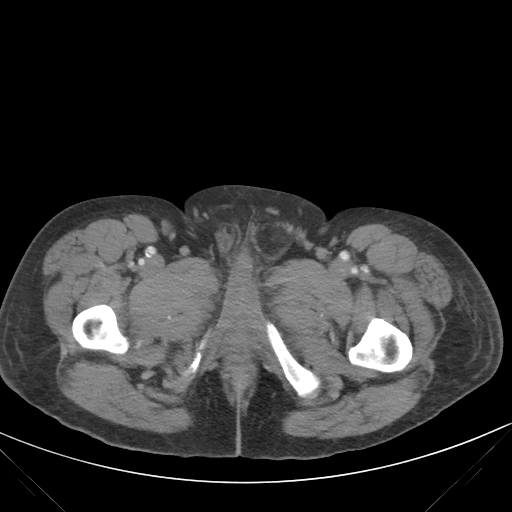
[im 6/96  bone]
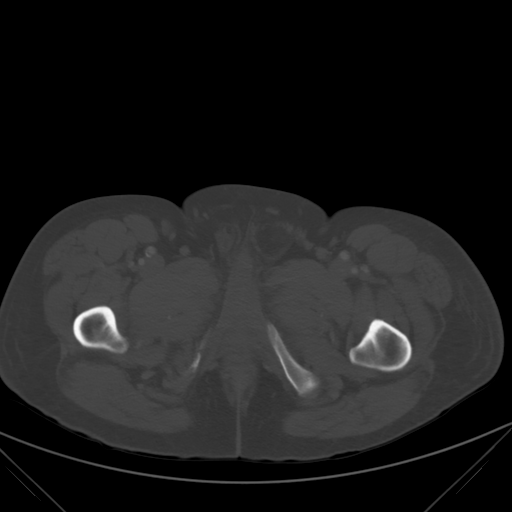
[im 11/96  soft-tissue]
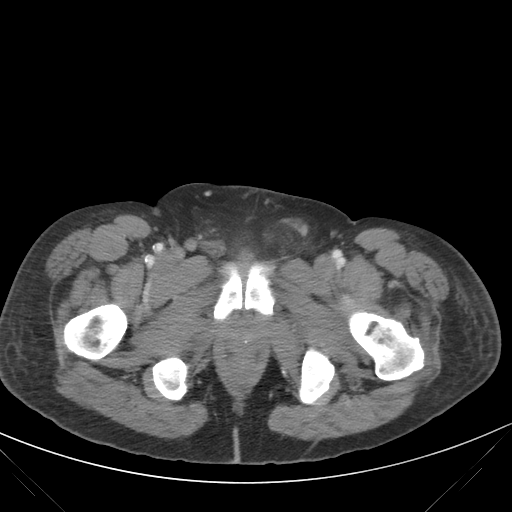
[im 22/96  soft-tissue]
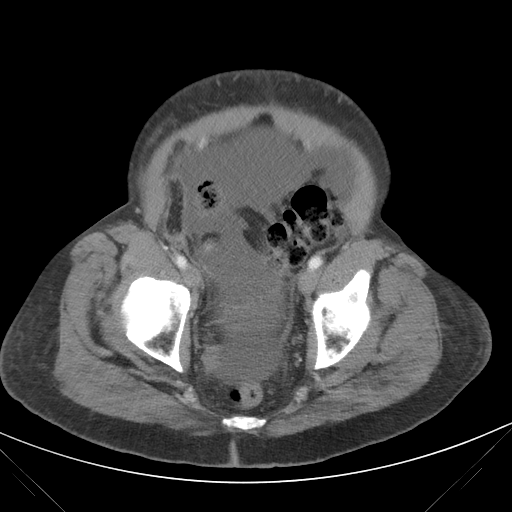
[im 27/96  soft-tissue]
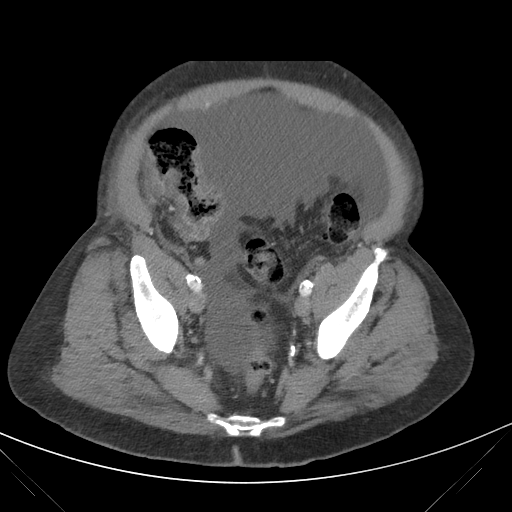
[im 32/96  soft-tissue]
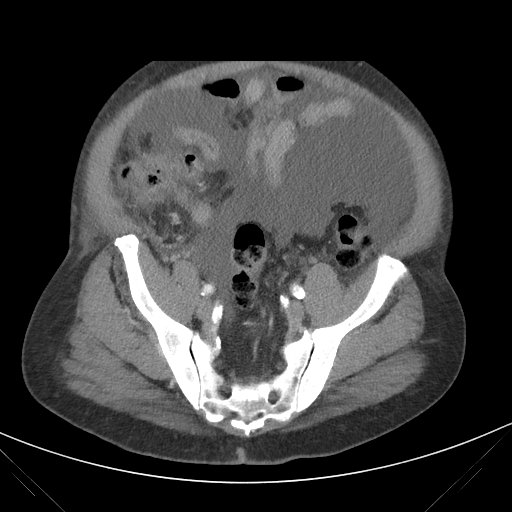
[im 43/96  soft-tissue]
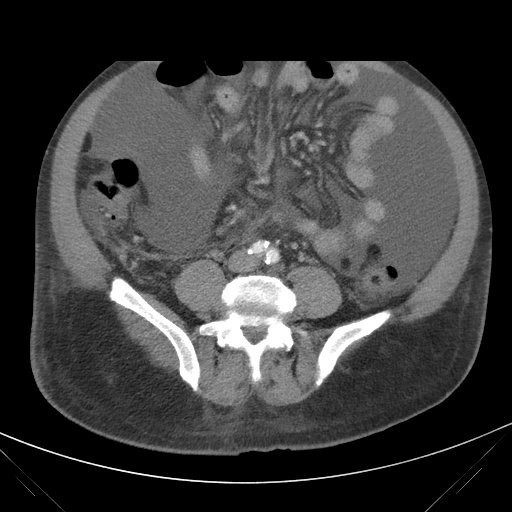
[im 48/96  soft-tissue]
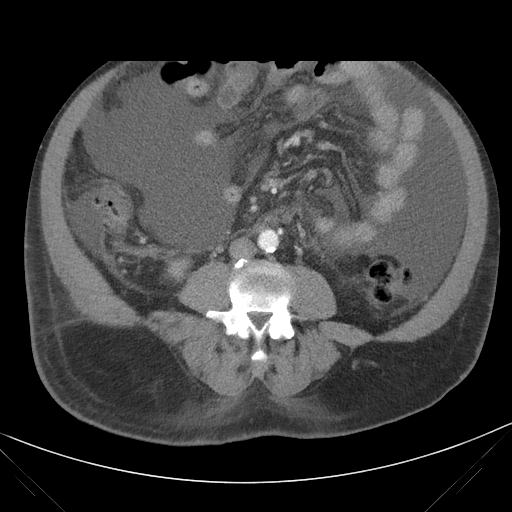
[im 53/96  soft-tissue]
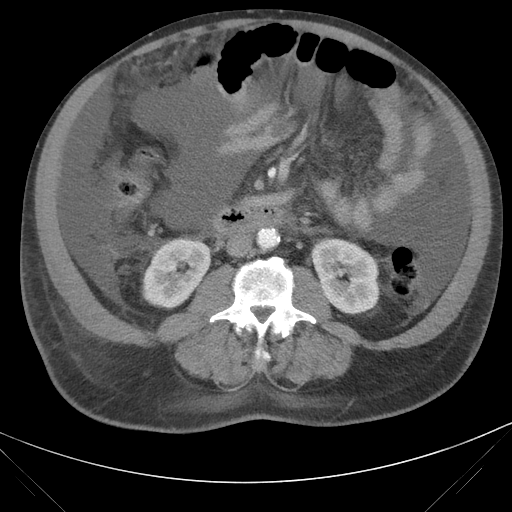
[im 64/96  soft-tissue]
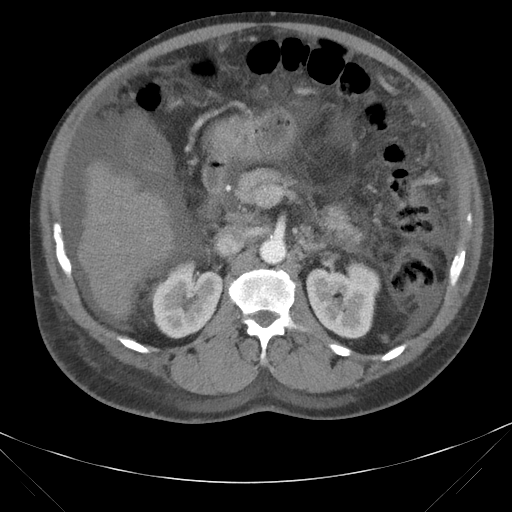
[im 64/96  bone]
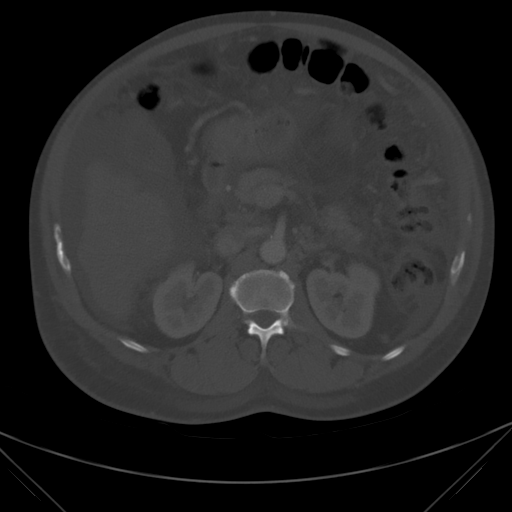
[im 69/96  soft-tissue]
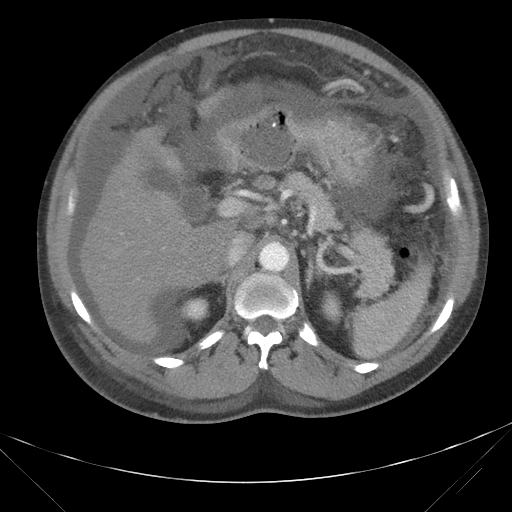
[im 74/96  soft-tissue]
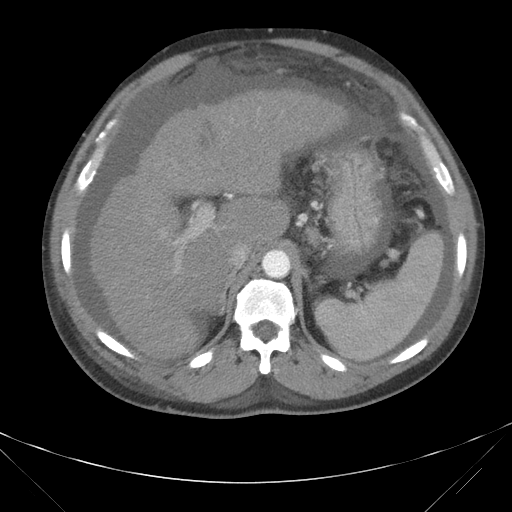
[im 85/96  soft-tissue]
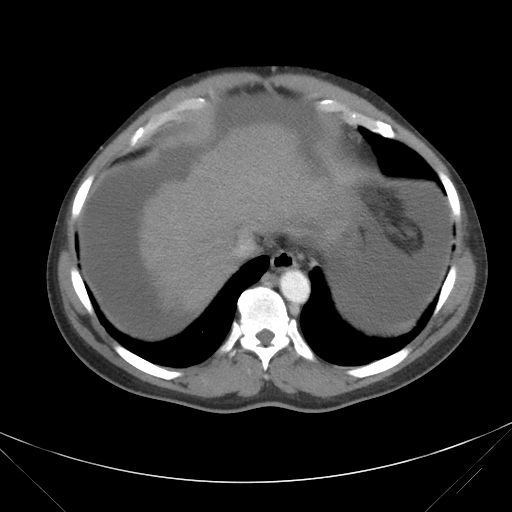
[im 90/96  soft-tissue]
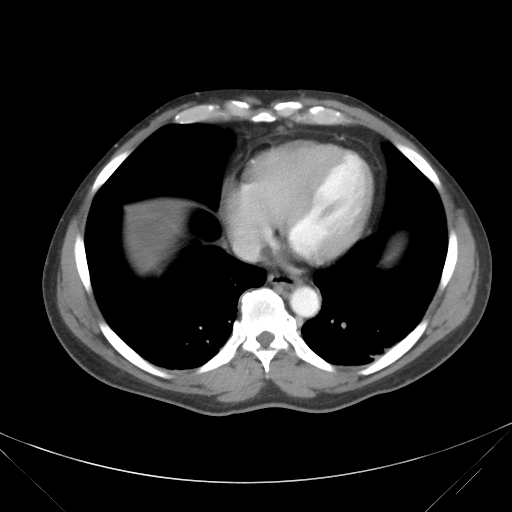

[coronals · coronal · 0.92mm/px · 3 of 114 slices shown]
[im 38/114  soft-tissue]
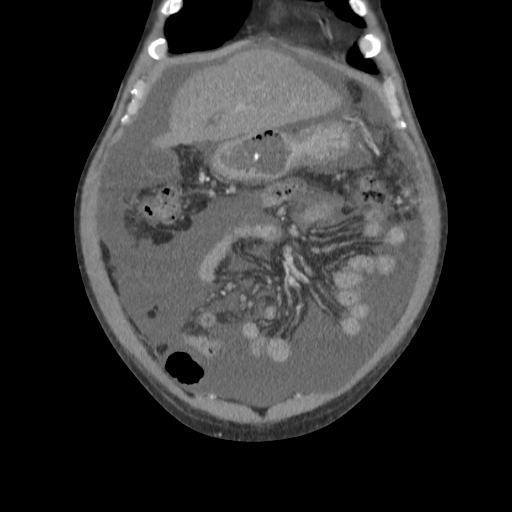
[im 51/114  soft-tissue]
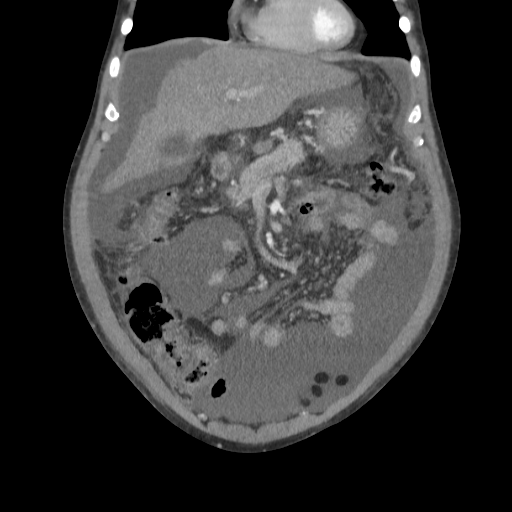
[im 63/114  soft-tissue]
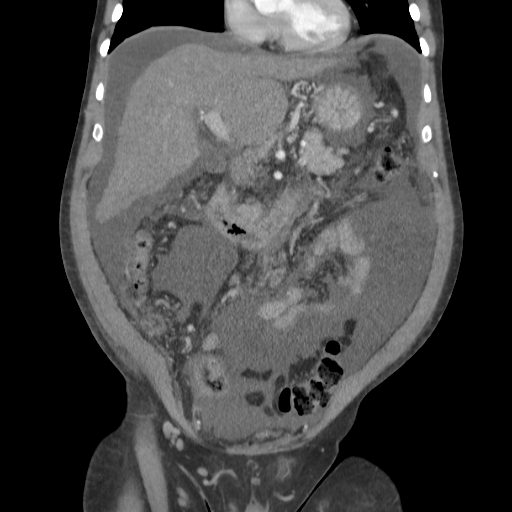

[16 of 46 positions shown; findings below may reference images not displayed]

FINDINGS: There is a linear atelectasis or fibrosis in the lung
bases.

Changes of hepatic cirrhosis with enlarged lateral segment left
lobe and caudate lobe.  Nodular contour of the liver.  Gallbladder
wall is thickened which may be response to ascites and cirrhosis.
Spleen size is normal.  Flow is demonstrated in the portal veins.
There is interval development of upper abdominal fluid collections.
Density measurements are consistent with ascites.  Fluid extends
throughout the abdomen and into the pelvis.  The pancreas, kidneys,
inferior vena cava, and retroperitoneal lymph nodes are
unremarkable.  Calcification of the abdominal aorta without
aneurysm.  There is a right adrenal gland nodule measuring 1.5 cm
diameter.  This is stable since previous study and probably
represents an adenoma.  Mildly prominent lymph nodes in the celiac
axis likely related to reactive change due to of cirrhosis.
Gastric and splenic varices.  The stomach and small bowel are
decompressed.  Stool filled colon without abnormal distension.  No
free air in the abdomen.  Abdominal wall musculature appears
intact.

Pelvis:  Pelvic ascites as previously indicated.  Fat containing
left inguinal hernia appears stable.  Bladder wall is not
thickened.  Prostate gland is not enlarged.  No significant pelvic
lymphadenopathy.  The appendix appears normal.  Degenerative
changes in the lumbar spine with normal alignment. Subcutaneous
soft tissue hematoma in the right flank region inferior to the
right 12th rib.  This measures about 4.2 x 8.6 by 3.1 cm.  No
displaced lumbar spine, pelvic, or rib fractures are identified.
IMPRESSION: Hepatic cirrhosis with developing diffuse abdominal and pelvic
ascites.  Gallbladder wall thickening is probably reactive.
Reactive lymph nodes in the celiac axis.  The upper abdominal
varices.  Subcutaneous soft tissue hematoma in the right posterior
flank region.  No displaced fractures identified.

## 2015-04-19 MED ORDER — VANCOMYCIN HCL 10 G IV SOLR
1500.0000 mg | Freq: Once | INTRAVENOUS | Status: AC
  Administered 2015-04-19: 1500 mg via INTRAVENOUS
  Filled 2015-04-19: qty 1500

## 2015-04-19 MED ORDER — VANCOMYCIN HCL IN DEXTROSE 1-5 GM/200ML-% IV SOLN
1000.0000 mg | Freq: Two times a day (BID) | INTRAVENOUS | Status: DC
  Administered 2015-04-19 – 2015-04-20 (×3): 1000 mg via INTRAVENOUS
  Filled 2015-04-19 (×5): qty 200

## 2015-04-19 MED ORDER — ALBUMIN HUMAN 25 % IV SOLN
12.5000 g | Freq: Once | INTRAVENOUS | Status: AC
  Administered 2015-04-19: 12.5 g via INTRAVENOUS
  Filled 2015-04-19: qty 50

## 2015-04-19 MED ORDER — LIDOCAINE HCL (PF) 1 % IJ SOLN
INTRAMUSCULAR | Status: AC
  Filled 2015-04-19: qty 10

## 2015-04-19 NOTE — Progress Notes (Signed)
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 Penicillins     Childhood Has received Rocephin w/o reaction    Patient Measurements: Height:  (170.2 cm) Weight: 184 lb 8.4 oz (83.7 kg) IBW/kg (Calculated) : 66.1  Vital Signs: Temp: 98.2 F (36.8 C) (10/06 0642) Temp Source: Oral (10/06 0642) BP: 96/71 mmHg (10/06 1610) Pulse Rate: 102 (10/06 0642) Intake/Output from previous day: 10/05 0701 - 10/06 0700 In: 120 [P.O.:120] Out: 1000 [Urine:1000] Intake/Output from this shift: Total I/O In: 0  Out: 225 [Urine:225]  Labs:  Recent Labs  04/16/15 1549 04/18/15 0542 04/19/15 0634  WBC 6.8  --  7.4  HGB 14.3  --  13.4  PLT 290  --  266  CREATININE 1.25* 1.30* 1.20  Estimated CrCl: 60- 70 ml/min  Microbiology: 10/4: peritoneal fluid: GPC in clusters  10/3 BCX: ngtd   ABX:  Vancomycin: 10/6>>  Assessment: 69 yoM with presumptive GPC in clusters growing in peritoneal fluid in setting decompensated cirrhosis that is requiring frequent/scheduled paracentesis   Goal of Therapy:  Vancomycin trough level 15-20 mcg/ml  Plan:  1. Vancomycin 1500 mg loading dose x 1 now; start maintenance dosing of 1000 mg q 12H starting this pm 2. VT as SS if continued 3. Monitor finalization of cx data, clinical reponse and adjust abx as needed    Pollyann Samples, PharmD, BCPS 04/19/2015, 10:58 AM Pager: [phone]

## 2015-04-19 NOTE — Progress Notes (Addendum)
Dr. Catha Gosselin made aware via page of BP 75/50 manual HR 90. Patient states he feels tired. Awaiting orders. 4:07 PM Telephone order hold lasix and aldactone scheduled at 1800.

## 2015-04-19 NOTE — Procedures (Signed)
Successful US guided paracentesis from LLQ.  Yielded 4.6L of clear yellow fluid.  No immediate complications.  Pt tolerated well.   Specimen was sent for labs.  Brayton El PA-C 04/19/2015 11:57 AM

## 2015-04-19 NOTE — Evaluation (Signed)
Physical Therapy Evaluation Patient Details Name: Curtis Estrada MRN: [id number] DOB: 12/19/1955 Today's Date: 04/19/2015   History of Present Illness  Pt adm with decompensated hepatic cirrhosis. Paracentisis on 10/4 removed 9L of fluid. Paracentesis 10/6 removed 4.6L. PMH - DM, Hep C, ETOH  Clinical Impression  Pt has been struggling with community mobility due to frequent accumulation of fluid which causes him to be extremely SOB and unable to amb outside of home. Recommend pt receive wheelchair for community use to allow him to get to his various appointments.    Follow Up Recommendations No PT follow up    Equipment Recommendations  Wheelchair (measurements PT) (18" width)    Recommendations for Other Services       Precautions / Restrictions        Mobility  Bed Mobility Overal bed mobility: Modified Independent                Transfers Overall transfer level: Modified independent Equipment used: None                Ambulation/Gait Ambulation/Gait assistance: Modified independent (Device/Increase time) Ambulation Distance (Feet): 200 Feet Assistive device:  (Pushing IV) Gait Pattern/deviations: Step-to pattern;Decreased stride length;Wide base of support Gait velocity: decr Gait velocity interpretation: Below normal speed for age/gender General Gait Details: Pt with steady gait today and able to complete 200' but he just had 4.6L of fluid removed with paracentesis.  Stairs            Wheelchair Mobility    Modified Rankin (Stroke Patients Only)       Balance Overall balance assessment: Modified Independent                                           Pertinent Vitals/Pain Pain Assessment: No/denies pain    Home Living Family/patient expects to be discharged to:: Private residence   Available Help at Discharge: Friend(s);Available PRN/intermittently;Family Type of Home: Apartment Home Access: Elevator     Home  Layout: One level Home Equipment: Environmental consultant - 2 wheels;Cane - single point;Adaptive equipment      Prior Function Level of Independence: Independent with assistive device(s)         Comments: Per pt only able to manage household distances not community distances due to SOB when fluid begins accumulating.      Hand Dominance   Dominant Hand: Right    Extremity/Trunk Assessment   Upper Extremity Assessment: Generalized weakness           Lower Extremity Assessment: Generalized weakness         Communication   Communication: No difficulties  Cognition Arousal/Alertness: Awake/alert Behavior During Therapy: WFL for tasks assessed/performed Overall Cognitive Status: Within Functional Limits for tasks assessed                      General Comments      Exercises        Assessment/Plan    PT Assessment Patent does not need any further PT services  PT Diagnosis Difficulty walking   PT Problem List    PT Treatment Interventions     PT Goals (Current goals can be found in the Care Plan section) Acute Rehab PT Goals PT Goal Formulation: Patient unable to participate in goal setting    Frequency     Barriers to discharge  Co-evaluation               End of Session   Activity Tolerance: Patient limited by fatigue Patient left: in bed;with call bell/phone within reach           Time: 1610-9604 PT Time Calculation (min) (ACUTE ONLY): 8 min   Charges:   PT Evaluation $Initial PT Evaluation Tier I: 1 Procedure     PT G Codes:        Orest Dygert 05-07-15, 2:33 PM  [facility] PT [phone]

## 2015-04-19 NOTE — Progress Notes (Signed)
[facility]                                                                              Patient Demographics  Curtis Estrada, is a 59 y.o. male, DOB - 1956-07-04, WJX:[id number]  Admit date - 04/16/2015   Admitting Physician Ozella Rocks, MD  Outpatient Primary MD for the patient is [facility]  LOS - 3   Chief Complaint  Patient presents with  . Ascites      HPI on 04/16/2015 by Dr. Shelly Flatten Curtis Estrada is a 59 y.o. Male. hhe reports feeling well at time of discharge. Patient was discharged from the hospital for decompensated hepatic cirrhosis on 04/06/2015.Marland Kitchen Patient states that partially 3 days after going home he began feeling short of breath and noticed he was swelling more in his abdomen and lower extremity. Patient states he's been compliant with his new medical regimen since discharge. States he's unable to exactly which pills he takes but states that he feels his pillbox every week by reading the label on his bottles and takes them accordingly. Patient states his last alcoholic beverages one month ago. States he had bloody diarrhea 1 since discharge but currently is without change in bowel habits,. Ongoing cough for 5 months that is no worse today. Symptoms are constant and getting worse. Denies any chest pain, fevers, abdominal pain, confusion, headache, neck stiffness, dysuria, rash.  Assessment & Plan   Decompensated hepatic cirrhosis -Patient was readmitted for nearly identical complaints. (Previous admission 9/20-9/23/2016). MELD score 16. Pt decompensating for past 7 days including 27lb wt gain. No evidence of SBP at this time, despite elevated lactic acid (2.21). Unsure if pt totally compliant w/ home regimen. Last ETOH >65mo ago -US guided paracentesis 04/17/2015, 9L removed -Continue Lasix, spironolactone, lactulose, Xifaxan, propranolol -Blood cultures show no growth to date -Fluid culture now shows GPC, although gram stain shows no organisms -Will  start patient on vancomycin empirically -Repeat paracentesis ordered for today as patient's abdomen is distended -Spoke with the [place] Kathryne Sharper), patient has GI follow up with Dr. Quinn Axe on 04/24/2015 at 3pm  Respiratory decompensation:  -Improving, will wean off of supplemental O2 -Likely secondary to ascites/volume overload -CXR w/o evidence of acute process.  -Last Echo 09/2014 w/ 55% EF and Grade 1 diastolic dysfunction -DDimer was elevated, 3.65 -Given AKI, obtained VQ scan which was negative for PE -repeat Lactic acid 2.5 (appears to be consistently elevated) -BNP 16.3  AKI -Baseline creatinine 0.9, currently 1.2.  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 Anticipate worsening due to diuresis.  -Continue to monitor BMP  Diabetes mellitus with neuropathy -A1c 8.4 on 03/2015 -Continue Lantus, ISS, CBG monitoring  -Continue gabapentin  GERD -continue PPI  Code Status: Full  Family Communication: None at bedside  Disposition Plan: Admitted. Pending paracentesis  Time Spent in minutes   30 minutes  Procedures  US guided paracentesis  Consults   None  DVT Prophylaxis  Heparin  Lab Results  Component Value Date   PLT 266 04/19/2015    Medications  Scheduled Meds: . folic acid  1 mg Oral Daily  . furosemide  120 mg Oral BID  .  gabapentin  600 mg Oral TID  . heparin  5,000 Units Subcutaneous 3 times per day  . insulin aspart  0-15 Units Subcutaneous TID WC  . insulin detemir  9 Units Subcutaneous QHS  . lactulose  10 g Oral BID  . magnesium oxide  400 mg Oral Daily  . multivitamin with minerals  1 tablet Oral Daily  . pantoprazole  40 mg Oral BID  . propranolol  10 mg Oral BID  . rifaximin  550 mg Oral BID  . sodium chloride  3 mL Intravenous Q12H  . sodium chloride  3 mL Intravenous Q12H  . spironolactone  50 mg Oral BID   Continuous Infusions:  PRN Meds:.sodium chloride, albuterol, benzonatate,  ondansetron **OR** ondansetron (ZOFRAN) IV, oxyCODONE, polyvinyl alcohol, sodium chloride, technetium TC 32M diethylenetriame-pentaacetic acid  Antibiotics    Anti-infectives    Start     Dose/Rate Route Frequency Ordered Stop   04/16/15 2300  rifaximin (XIFAXAN) tablet 550 mg     550 mg Oral 2 times daily 04/16/15 1956        Subjective:   Curtis Estrada seen and examined today. Patient states he feels that his abdomen continues to get larger and has lower abdominal pain.  Denies nausea, vomiting.  Denies SOB, Chest pain at this time.   Objective:   Filed Vitals:   04/18/15 1828 04/18/15 2218 04/19/15 0500 04/19/15 0642  BP: 92/65 95/59  96/71  Pulse: 102 103  102  Temp:  98.3 F (36.8 C)  98.2 F (36.8 C)  TempSrc:  Oral  Oral  Resp:  16  12  Height:      Weight:   83.7 kg (184 lb 8.4 oz)   SpO2:  98%  97%    Wt Readings from Last 3 Encounters:  04/19/15 83.7 kg (184 lb 8.4 oz)  04/06/15 77.928 kg (171 lb 12.8 oz)  03/24/15 82.3 kg (181 lb 7 oz)     Intake/Output Summary (Last 24 hours) at 04/19/15 1005 Last data filed at 04/19/15 0857  Gross per 24 hour  Intake     70 ml  Output    975 ml  Net   -905 ml    Exam  General: Well developed, well nourished, NAD  HEENT: NCAT, mucous membranes moist. Scleral icterus  Cardiovascular: S1 S2 auscultated, RRR, 2/6SEM  Respiratory: Clear to auscultation   Abdomen: Soft, nontender, distended, + bowel sounds  Extremities: warm dry without cyanosis clubbing. +1 LE edema B/L  Neuro: AAOx3, nonfocal  Psych: Normal affect and demeanor  Data Review   Micro Results Recent Results (from the past 240 hour(s))  Culture, blood (routine x 2)     Status: None (Preliminary result)   Collection Time: 04/16/15  9:01 PM  Result Value Ref Range Status   Specimen Description BLOOD RIGHT ANTECUBITAL  Final   Special Requests BOTTLES DRAWN AEROBIC AND ANAEROBIC 10CC   Final   Culture NO GROWTH 2 DAYS  Final   Report Status  PENDING  Incomplete  Culture, blood (routine x 2)     Status: None (Preliminary result)   Collection Time: 04/16/15  9:09 PM  Result Value Ref Range Status   Specimen Description BLOOD RIGHT HAND  Final   Special Requests IN PEDIATRIC BOTTLE 1.5CC  Final   Culture NO GROWTH 2 DAYS  Final   Report Status PENDING  Incomplete  Culture, body fluid-bottle     Status: None (Preliminary result)   Collection Time: 04/17/15 10:53  AM  Result Value Ref Range Status   Specimen Description FLUID PERITONEAL  Final   Special Requests BOTTLES DRAWN AEROBIC AND ANAEROBIC 10CC  Final   Gram Stain   Final    GRAM POSITIVE COCCI IN CLUSTERS AEROBIC BOTTLE ONLY CRITICAL RESULT CALLED TO, READ BACK BY AND VERIFIED WITH: K CAMPBELL RN 1900 04/18/15 A BROWNING    Culture NO GROWTH < 24 HOURS  Final   Report Status PENDING  Incomplete  Gram stain     Status: None   Collection Time: 04/17/15 10:53 AM  Result Value Ref Range Status   Specimen Description FLUID PERITONEAL  Final   Special Requests NONE  Final   Gram Stain   Final    RARE WBC PRESENT, PREDOMINANTLY MONONUCLEAR NO ORGANISMS SEEN GRAM STAIN REVIEWED-AGREE WITH RESULT SYARBOROUGH    Report Status 04/17/2015 FINAL  Final    Radiology Reports Dg Chest 1 View  04/18/2015   CLINICAL DATA:  Cough and shortness of Breath  EXAM: CHEST 1 VIEW  COMPARISON:  April 16, 2015  FINDINGS: There is mild scarring in the bases. There is no edema or consolidation. The heart size and pulmonary vascularity are within normal limits. No adenopathy. No bone lesions.  IMPRESSION: Stable scarring in the bases.  No edema or consolidation.   Electronically Signed   By: Bretta Bang III M.D.   On: 04/18/2015 17:52   Dg Chest 2 View  04/03/2015   CLINICAL DATA:  Shortness of breath.  EXAM: CHEST  2 VIEW  COMPARISON:  March 21, 2015.  FINDINGS: Hypoinflation of the lungs is noted. No pneumothorax or significant pleural effusion is noted. Stable bibasilar linear  densities are noted most consistent with scarring or possibly atelectasis. Bony thorax is intact.  IMPRESSION: Hypoinflation of the lungs with stable bibasilar opacities consistent with scarring or subsegmental atelectasis.   Electronically Signed   By: Lupita Raider, M.D.   On: 04/03/2015 15:05   Dg Chest 2 View  03/21/2015   CLINICAL DATA:  Chest pain, cough.  EXAM: CHEST  2 VIEW  COMPARISON:  February 12, 2015.  FINDINGS: The heart size and mediastinal contours are within normal limits. No pneumothorax or significant pleural effusion is noted right lung is clear. Grossly stable linear densities are noted in left lung base most consistent with scarring or subsegmental atelectasis. The visualized skeletal structures are unremarkable.  IMPRESSION: Grossly stable left basilar linear densities are noted most consistent with scarring or subsegmental atelectasis. No significant changes noted compared to prior exam.   Electronically Signed   By: Lupita Raider, M.D.   On: 03/21/2015 09:45   Nm Pulmonary Perf And Vent  04/18/2015   CLINICAL DATA:  Difficulty breathing  EXAM: NUCLEAR MEDICINE VENTILATION - PERFUSION LUNG SCAN  Views: Anterior, posterior, left lateral, right lateral, RPO, LPO, RAO, LAO- ventilation and perfusion  Radionuclide: Technetium 19m DTPA-ventilation; Technetium 21m macroaggregated albumin-perfusion  Dose:  40.5 mCi-ventilation; 6.2 mCi-perfusion  Route of administration: Inhalation -ventilation ; intravenous-perfusion  COMPARISON:  Chest radiograph April 16, 2015  FINDINGS: Ventilation: Radiotracer uptake is homogeneous and symmetric bilaterally without focal defect.  Perfusion: Radiotracer uptake is homogeneous and symmetric bilaterally without focal defect.  IMPRESSION: No ventilation or perfusion defects. This study constitutes a very low probability of pulmonary embolus.   Electronically Signed   By: Bretta Bang III M.D.   On: 04/18/2015 17:47   US Paracentesis  04/17/2015    INDICATION: Cirrhosis, hepatitis-C, recurrent ascites. Request is  made for diagnostic and therapeutic paracentesis.  EXAM: ULTRASOUND-GUIDED DIAGNOSTIC AND THERAPEUTIC PARACENTESIS  COMPARISON:  Prior paracentesis on 04/05/2015  MEDICATIONS: None.  COMPLICATIONS: None immediate  TECHNIQUE: Informed written consent was obtained from the patient after a discussion of the risks, benefits and alternatives to treatment. A timeout was performed prior to the initiation of the procedure.  Initial ultrasound scanning demonstrates a large amount of ascites within the left lower abdominal quadrant. The left lower abdomen was prepped and draped in the usual sterile fashion. 1% lidocaine was used for local anesthesia. Under direct ultrasound guidance, a 19 gauge, 10-cm, Yueh catheter was introduced. An ultrasound image was saved for documentation purposed. The paracentesis was performed. The catheter was removed and a dressing was applied. The patient tolerated the procedure well without immediate post procedural complication.  FINDINGS: A total of approximately 9 liters of turbid, light yellow fluid was removed. Samples were sent to the laboratory as requested by the clinical team.  IMPRESSION: Successful ultrasound-guided diagnostic and therapeutic paracentesis yielding 9 liters of peritoneal fluid.  Read by: Jeananne Rama, PA-C   Electronically Signed   By: Gilmer Mor D.O.   On: 04/17/2015 12:32   US Paracentesis  04/05/2015   INDICATION: Hepatitis C, cirrhosis, recurrent ascites and request for paracentesis.  EXAM: ULTRASOUND-GUIDED PARACENTESIS  COMPARISON:  Paracentesis 04/04/15.  MEDICATIONS: None.  COMPLICATIONS: None immediate  TECHNIQUE: Informed written consent was obtained from the patient after a discussion of the risks, benefits and alternatives to treatment. A timeout was performed prior to the initiation of the procedure.  Initial ultrasound scanning demonstrates a large amount of ascites within the right  lower abdominal quadrant. The right lower abdomen was prepped and draped in the usual sterile fashion. 1% lidocaine was used for local anesthesia.  Under direct ultrasound guidance, a 19 gauge, 7-cm, Yueh catheter was introduced. An ultrasound image was saved for documentation purposed. The paracentesis was performed. The catheter was removed and a dressing was applied. The patient tolerated the procedure well without immediate post procedural complication.  FINDINGS: A total of approximately 6.2 liters of cloudy yellow fluid was removed.  IMPRESSION: Successful ultrasound-guided paracentesis yielding 6.2 liters of peritoneal fluid.  Read By:  Pattricia Boss PA-C   Electronically Signed   By: Gilmer Mor D.O.   On: 04/05/2015 17:27   US Paracentesis  04/04/2015   INDICATION: Hepatitis C, cirrhosis, recurrent ascites and request for paracentesis.  EXAM: ULTRASOUND-GUIDED PARACENTESIS  COMPARISON:  Paracentesis 03/23/2015.  MEDICATIONS: None.  COMPLICATIONS: None immediate  TECHNIQUE: Informed written consent was obtained from the patient after a discussion of the risks, benefits and alternatives to treatment. A timeout was performed prior to the initiation of the procedure.  Initial ultrasound scanning demonstrates a large amount of ascites within the left lower abdominal quadrant. The left lower abdomen was prepped and draped in the usual sterile fashion. 1% lidocaine was used for local anesthesia.  Under direct ultrasound guidance, a 19 gauge, 7-cm, Yueh catheter was introduced. An ultrasound image was saved for documentation purposed. The paracentesis was performed. The catheter was removed and a dressing was applied. The patient tolerated the procedure well without immediate post procedural complication.  FINDINGS: A total of approximately 5 liters of cloudy yellow fluid was removed. Samples were sent to the laboratory as requested by the clinical team.  IMPRESSION: Successful ultrasound-guided paracentesis  yielding 5 liters of peritoneal fluid.  Read By:  Pattricia Boss PA-C   Electronically Signed   By: Simonne Come  M.D.   On: 04/04/2015 14:44   US Paracentesis  03/23/2015   INDICATION: Hepatitis C, cirrhosis, recurrent ascites and request for paracentesis.  EXAM: ULTRASOUND-GUIDED PARACENTESIS  COMPARISON:  Paracentesis 03/21/15.  MEDICATIONS: None.  COMPLICATIONS: None immediate  TECHNIQUE: Informed written consent was obtained from the patient after a discussion of the risks, benefits and alternatives to treatment. A timeout was performed prior to the initiation of the procedure.  Initial ultrasound scanning demonstrates a large amount of ascites within the left lower abdominal quadrant. The left lower abdomen was prepped and draped in the usual sterile fashion. 1% lidocaine with epinephrine was used for local anesthesia.  Under direct ultrasound guidance, a 19 gauge, 10-cm, Yueh catheter was introduced. An ultrasound image was saved for documentation purposed. The paracentesis was performed. The catheter was removed and a dressing was applied. The patient tolerated the procedure well without immediate post procedural complication.  FINDINGS: A total of approximately 4.5 liters of serous fluid was removed.  IMPRESSION: Successful ultrasound-guided paracentesis yielding 4.5 liters of peritoneal fluid.  Read By:  Pattricia Boss PA-C   Electronically Signed   By: Gilmer Mor D.O.   On: 03/23/2015 12:12   US Paracentesis  03/21/2015   INDICATION: ascites  EXAM: ULTRASOUND-GUIDED PARACENTESIS  COMPARISON:  Previous paracentesis  MEDICATIONS: 10 cc 1% lidocaine  COMPLICATIONS: None immediate  TECHNIQUE: Informed written consent was obtained from the patient after a discussion of the risks, benefits and alternatives to treatment. A timeout was performed prior to the initiation of the procedure.  Initial ultrasound scanning demonstrates a large amount of ascites within the right lower abdominal quadrant. The right lower  abdomen was prepped and draped in the usual sterile fashion. 1% lidocaine with epinephrine was used for local anesthesia. Under direct ultrasound guidance, a 19 gauge, 7-cm, Yueh catheter was introduced. An ultrasound image was saved for documentation purposed. The paracentesis was performed. The catheter was removed and a dressing was applied. The patient tolerated the procedure well without immediate post procedural complication.  FINDINGS: A total of approximately 7 liters of cloudy yellow fluid was removed. Samples were sent to the laboratory as requested by the clinical team.  IMPRESSION: Successful ultrasound-guided paracentesis yielding 7 liters of peritoneal fluid.  Read by:  Robet Leu [facility]   Electronically Signed   By: Irish Lack M.D.   On: 03/21/2015 14:39   Dg Chest Portable 1 View  04/16/2015   CLINICAL DATA:  Abdominal swelling. Paracentesis last week. Subsequent encounter.  EXAM: PORTABLE CHEST 1 VIEW  COMPARISON:  Chest radiographs 04/03/2015 and 03/21/2015.  FINDINGS: 1849 hours. The degree of inspiration is improved compared with the prior study. There is improved linear atelectasis at both lung bases. No edema, confluent airspace opacity or significant pleural effusion identified. The heart size and mediastinal contours are stable.  IMPRESSION: Interval improved basilar aeration and atelectasis. No acute findings.   Electronically Signed   By: Carey Bullocks M.D.   On: 04/16/2015 19:14    CBC  Recent Labs Lab 04/16/15 1549 04/19/15 0634  WBC 6.8 7.4  HGB 14.3 13.4  HCT 42.8 40.7  PLT 290 266  MCV 68.5* 68.8*  MCH 22.9* 22.6*  MCHC 33.4 32.9  RDW 21.4* 21.3*    Chemistries   Recent Labs Lab 04/16/15 1549 04/18/15 0542 04/19/15 0634  NA 133* 134* 130*  K 3.5 4.0 3.9  CL 99* 99* 94*  CO2 GLUCOSE 145* 156* 129*  BUN 12 10 10  CREATININE 1.25* 1.30* 1.20  CALCIUM 8.4* 8.2* 7.9*  AST 57* 49* 50*  ALT 27 21 24   ALKPHOS 144* 106 112  BILITOT 2.0*  1.3* 1.3*   ------------------------------------------------------------------------------------------------------------------ estimated creatinine clearance is 66.8 mL/min (by C-G formula based on Cr of 1.2). ------------------------------------------------------------------------------------------------------------------ No results for input(s): HGBA1C in the last 72 hours. ------------------------------------------------------------------------------------------------------------------ No results for input(s): CHOL, HDL, LDLCALC, TRIG, CHOLHDL, LDLDIRECT in the last 72 hours. ------------------------------------------------------------------------------------------------------------------ No results for input(s): TSH, T4TOTAL, T3FREE, THYROIDAB in the last 72 hours.  Invalid input(s): FREET3 ------------------------------------------------------------------------------------------------------------------ No results for input(s): VITAMINB12, FOLATE, FERRITIN, TIBC, IRON, RETICCTPCT in the last 72 hours.  Coagulation profile  Recent Labs Lab 04/17/15 0616  INR 1.40     Recent Labs  04/16/15 2101  DDIMER 3.65*    Cardiac Enzymes No results for input(s): CKMB, TROPONINI, MYOGLOBIN in the last 168 hours.  Invalid input(s): CK ------------------------------------------------------------------------------------------------------------------ Invalid input(s): POCBNP    Saydee Zolman D.O. on 04/19/2015 at 10:05 AM  Between 7am to 7pm - Pager - [phone]  After 7pm go to [email or web address] - password [email or web address]  And look for the night coverage person covering for me after hours  [facility] Office  [phone]

## 2015-04-19 NOTE — Clinical Social Work Note (Signed)
Curtis Estrada requested assistance with transportation to medical appointments after being discharged. CSW Intern spoke with patient regarding transportation request. Patient accepted transportation resources provided and had no further questions or concerns.   Baldo Daub Clinical Social Work Intern Preceptor: Genelle Bal [phone]

## 2015-04-20 LAB — BASIC METABOLIC PANEL
ANION GAP: 10 (ref 5–15)
BUN: 11 mg/dL (ref 6–20)
CHLORIDE: 94 mmol/L — AB (ref 101–111)
CO2: 26 mmol/L (ref 22–32)
Calcium: 8.2 mg/dL — ABNORMAL LOW (ref 8.9–10.3)
Creatinine, Ser: 1.07 mg/dL (ref 0.61–1.24)
GFR calc non Af Amer: >60 mL/min (ref >60)
GLUCOSE: 128 mg/dL — AB (ref 65–99)
Potassium: 3.9 mmol/L (ref 3.5–5.1)
Sodium: 130 mmol/L — ABNORMAL LOW (ref 135–145)

## 2015-04-20 LAB — CBC
HEMATOCRIT: 38.6 % — AB (ref 39.0–52.0)
HEMOGLOBIN: 13 g/dL (ref 13.0–17.0)
MCH: 23.3 pg — ABNORMAL LOW (ref 26.0–34.0)
MCHC: 33.7 g/dL (ref 30.0–36.0)
MCV: 69.2 fL — AB (ref 78.0–100.0)
Platelets: 291 10*3/uL (ref 150–400)
RBC: 5.58 MIL/uL (ref 4.22–5.81)
RDW: 21.3 % — AB (ref 11.5–15.5)
WBC: 7.7 10*3/uL (ref 4.0–10.5)

## 2015-04-20 LAB — GLUCOSE, CAPILLARY
GLUCOSE-CAPILLARY: 207 mg/dL — AB (ref 65–99)
GLUCOSE-CAPILLARY: 151 mg/dL — AB (ref 65–99)
Glucose-Capillary: 130 mg/dL — ABNORMAL HIGH (ref 65–99)
Glucose-Capillary: 219 mg/dL — ABNORMAL HIGH (ref 65–99)

## 2015-04-20 MED ORDER — FUROSEMIDE 40 MG PO TABS
60.0000 mg | ORAL_TABLET | Freq: Two times a day (BID) | ORAL | Status: DC
  Administered 2015-04-20 – 2015-04-21 (×2): 60 mg via ORAL
  Filled 2015-04-20 (×6): qty 1

## 2015-04-20 NOTE — Progress Notes (Signed)
[facility]                                                                              Patient Demographics  Curtis Estrada, is a 59 y.o. male, DOB - 1955/12/18, ZOX:[id number]  Admit date - 04/16/2015   Admitting Physician Ozella Rocks, MD  Outpatient Primary MD for the patient is [facility]  LOS - 4   Chief Complaint  Patient presents with  . Ascites      HPI on 04/16/2015 by Dr. Shelly Flatten Curtis Estrada is a 59 y.o. Male. hhe reports feeling well at time of discharge. Patient was discharged from the hospital for decompensated hepatic cirrhosis on 04/06/2015.Marland Kitchen Patient states that partially 3 days after going home he began feeling short of breath and noticed he was swelling more in his abdomen and lower extremity. Patient states he's been compliant with his new medical regimen since discharge. States he's unable to exactly which pills he takes but states that he feels his pillbox every week by reading the label on his bottles and takes them accordingly. Patient states his last alcoholic beverages one month ago. States he had bloody diarrhea 1 since discharge but currently is without change in bowel habits,. Ongoing cough for 5 months that is no worse today. Symptoms are constant and getting worse. Denies any chest pain, fevers, abdominal pain, confusion, headache, neck stiffness, dysuria, rash.  Assessment & Plan   Decompensated hepatic cirrhosis -Patient was readmitted for nearly identical complaints. (Previous admission 9/20-9/23/2016). MELD score 16. Pt decompensating for past 7 days including 27lb wt gain. No evidence of SBP at this time, despite elevated lactic acid (2.21). Unsure if pt totally compliant w/ home regimen. Last ETOH >60mo ago -US guided paracentesis 04/17/2015, 9L removed -Continue Lasix, spironolactone, lactulose, Xifaxan, propranolol -Blood cultures show no growth to date -Fluid culture now shows GPC-pending organsim, although gram stain shows no  organisms -Continue vancomycin empirically -Repeat paracentesis 04/19/2015, removal 4L, culture shows no growth  -Spoke with the [place] Kathryne Sharper), patient has GI follow up with Dr. Quinn Axe on 04/24/2015 at 3pm  Respiratory decompensation:  -Improving, will wean off of supplemental O2 -Likely secondary to ascites/volume overload -CXR w/o evidence of acute process.  -Last Echo 09/2014 w/ 55% EF and Grade 1 diastolic dysfunction -DDimer was elevated, 3.65 -Given AKI, obtained VQ scan which was negative for PE -repeat Lactic acid 2.5 (appears to be consistently elevated) -BNP 16.3  AKI -Baseline creatinine 0.9, currently 1.07.  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 Anticipate worsening due to diuresis.  -Continue to monitor BMP  Diabetes mellitus with neuropathy -A1c 8.4 on 03/2015 -Continue Lantus, ISS, CBG monitoring  -Continue gabapentin  GERD -continue PPI  Code Status: Full  Family Communication: None at bedside  Disposition Plan: Admitted. Pending fluid culture.    Time Spent in minutes   30 minutes  Procedures  US guided paracentesis  Consults   None  DVT Prophylaxis  Heparin  Lab Results  Component Value Date   PLT 291 04/20/2015    Medications  Scheduled Meds: . folic acid  1 mg Oral Daily  . furosemide  60 mg Oral BID  .  gabapentin  600 mg Oral TID  . heparin  5,000 Units Subcutaneous 3 times per day  . insulin aspart  0-15 Units Subcutaneous TID WC  . insulin detemir  9 Units Subcutaneous QHS  . lactulose  10 g Oral BID  . magnesium oxide  400 mg Oral Daily  . multivitamin with minerals  1 tablet Oral Daily  . pantoprazole  40 mg Oral BID  . propranolol  10 mg Oral BID  . rifaximin  550 mg Oral BID  . sodium chloride  3 mL Intravenous Q12H  . sodium chloride  3 mL Intravenous Q12H  . spironolactone  50 mg Oral BID  . vancomycin  1,000 mg Intravenous Q12H   Continuous Infusions:  PRN  Meds:.sodium chloride, albuterol, benzonatate, ondansetron **OR** ondansetron (ZOFRAN) IV, oxyCODONE, polyvinyl alcohol, sodium chloride, technetium TC 64M diethylenetriame-pentaacetic acid  Antibiotics    Anti-infectives    Start     Dose/Rate Route Frequency Ordered Stop   04/19/15 2300  vancomycin (VANCOCIN) IVPB 1000 mg/200 mL premix     1,000 mg 200 mL/hr over 60 Minutes Intravenous Every 12 hours 04/19/15 1101     04/19/15 1030  vancomycin (VANCOCIN) 1,500 mg in sodium chloride 0.9 % 500 mL IVPB     1,500 mg 250 mL/hr over 120 Minutes Intravenous  Once 04/19/15 1029 04/19/15 1414   04/16/15 2300  rifaximin (XIFAXAN) tablet 550 mg     550 mg Oral 2 times daily 04/16/15 1956        Subjective:   Curtis Estrada seen and examined today. Patient denies nausea, vomiting, SOB, Chest pain.  Has some abdominal soreness.  Objective:   Filed Vitals:   04/19/15 2104 04/20/15 0452 04/20/15 0507 04/20/15 1055  BP: 76/52  86/61 84/58  Pulse: 91  98 106  Temp: 97.4 F (36.3 C)  98 F (36.7 C)   TempSrc: Oral  Oral   Resp: 18     Height:      Weight:  81.4 kg (179 lb 7.3 oz)    SpO2: 96%  96%     Wt Readings from Last 3 Encounters:  04/20/15 81.4 kg (179 lb 7.3 oz)  04/06/15 77.928 kg (171 lb 12.8 oz)  03/24/15 82.3 kg (181 lb 7 oz)     Intake/Output Summary (Last 24 hours) at 04/20/15 1403 Last data filed at 04/20/15 0916  Gross per 24 hour  Intake    120 ml  Output    650 ml  Net   -530 ml    Exam  General: Well developed, well nourished, NAD  HEENT: NCAT, mucous membranes moist. Scleral icterus  Cardiovascular: S1 S2 auscultated, RRR, 2/6SEM  Respiratory: Clear to auscultation bilaterally   Abdomen: Soft, nontender, distended, + bowel sounds  Extremities: warm dry without cyanosis clubbing. +1 LE edema B/L  Neuro: AAOx3, nonfocal  Psych: Normal affect and demeanor, pleasant  Data Review   Micro Results Recent Results (from the past 240 hour(s))    Culture, blood (routine x 2)     Status: None (Preliminary result)   Collection Time: 04/16/15  9:01 PM  Result Value Ref Range Status   Specimen Description BLOOD RIGHT ANTECUBITAL  Final   Special Requests BOTTLES DRAWN AEROBIC AND ANAEROBIC 10CC   Final   Culture NO GROWTH 4 DAYS  Final   Report Status PENDING  Incomplete  Culture, blood (routine x 2)     Status: None (Preliminary result)   Collection Time: 04/16/15  9:09 PM  Result Value Ref Range Status   Specimen Description BLOOD RIGHT HAND  Final   Special Requests IN PEDIATRIC BOTTLE 1.5CC  Final   Culture NO GROWTH 4 DAYS  Final   Report Status PENDING  Incomplete  Culture, body fluid-bottle     Status: None (Preliminary result)   Collection Time: 04/17/15 10:53 AM  Result Value Ref Range Status   Specimen Description FLUID PERITONEAL  Final   Special Requests BOTTLES DRAWN AEROBIC AND ANAEROBIC 10CC  Final   Gram Stain   Final    GRAM POSITIVE COCCI IN CLUSTERS AEROBIC BOTTLE ONLY CRITICAL RESULT CALLED TO, READ BACK BY AND VERIFIED WITH: K CAMPBELL RN 1900 04/18/15 A BROWNING    Culture GRAM POSITIVE COCCI  Final   Report Status PENDING  Incomplete  Gram stain     Status: None   Collection Time: 04/17/15 10:53 AM  Result Value Ref Range Status   Specimen Description FLUID PERITONEAL  Final   Special Requests NONE  Final   Gram Stain   Final    RARE WBC PRESENT, PREDOMINANTLY MONONUCLEAR NO ORGANISMS SEEN GRAM STAIN REVIEWED-AGREE WITH RESULT SYARBOROUGH    Report Status 04/17/2015 FINAL  Final  Culture, body fluid-bottle     Status: None (Preliminary result)   Collection Time: 04/19/15 11:22 AM  Result Value Ref Range Status   Specimen Description FLUID ABDOMEN PERITONEAL  Final   Special Requests NONE  Final   Culture NO GROWTH < 24 HOURS  Final   Report Status PENDING  Incomplete  Gram stain     Status: None   Collection Time: 04/19/15 11:22 AM  Result Value Ref Range Status   Specimen Description FLUID  ABDOMEN PERITONEAL  Final   Special Requests NONE  Final   Gram Stain   Final    MODERATE WBC PRESENT, PREDOMINANTLY MONONUCLEAR NO ORGANISMS SEEN    Report Status 04/19/2015 FINAL  Final    Radiology Reports Dg Chest 1 View  04/18/2015   CLINICAL DATA:  Cough and shortness of Breath  EXAM: CHEST 1 VIEW  COMPARISON:  April 16, 2015  FINDINGS: There is mild scarring in the bases. There is no edema or consolidation. The heart size and pulmonary vascularity are within normal limits. No adenopathy. No bone lesions.  IMPRESSION: Stable scarring in the bases.  No edema or consolidation.   Electronically Signed   By: Bretta Bang III M.D.   On: 04/18/2015 17:52   Dg Chest 2 View  04/03/2015   CLINICAL DATA:  Shortness of breath.  EXAM: CHEST  2 VIEW  COMPARISON:  March 21, 2015.  FINDINGS: Hypoinflation of the lungs is noted. No pneumothorax or significant pleural effusion is noted. Stable bibasilar linear densities are noted most consistent with scarring or possibly atelectasis. Bony thorax is intact.  IMPRESSION: Hypoinflation of the lungs with stable bibasilar opacities consistent with scarring or subsegmental atelectasis.   Electronically Signed   By: Lupita Raider, M.D.   On: 04/03/2015 15:05   Nm Pulmonary Perf And Vent  04/18/2015   CLINICAL DATA:  Difficulty breathing  EXAM: NUCLEAR MEDICINE VENTILATION - PERFUSION LUNG SCAN  Views: Anterior, posterior, left lateral, right lateral, RPO, LPO, RAO, LAO- ventilation and perfusion  Radionuclide: Technetium 64m DTPA-ventilation; Technetium 59m macroaggregated albumin-perfusion  Dose:  40.5 mCi-ventilation; 6.2 mCi-perfusion  Route of administration: Inhalation -ventilation ; intravenous-perfusion  COMPARISON:  Chest radiograph April 16, 2015  FINDINGS: Ventilation: Radiotracer uptake is homogeneous and symmetric bilaterally without focal defect.  Perfusion: Radiotracer uptake is homogeneous and symmetric bilaterally without focal defect.   IMPRESSION: No ventilation or perfusion defects. This study constitutes a very low probability of pulmonary embolus.   Electronically Signed   By: Bretta Bang III M.D.   On: 04/18/2015 17:47   US Paracentesis  04/19/2015   CLINICAL DATA:  Decompensated hepatic cirrhosis. Recurrent ascites. Request diagnostic and therapeutic paracentesis.  EXAM: ULTRASOUND GUIDED PARACENTESIS  COMPARISON:  Previous paracentesis 04/17/2015  PROCEDURE: An ultrasound guided paracentesis was thoroughly discussed with the patient and questions answered. The benefits, risks, alternatives and complications were also discussed. The patient understands and wishes to proceed with the procedure. Written consent was obtained.  Ultrasound was performed to localize and mark an adequate pocket of fluid in the left lower quadrant of the abdomen. The area was then prepped and draped in the normal sterile fashion. 1% Lidocaine was used for local anesthesia. Under ultrasound guidance a 19 gauge Yueh catheter was introduced. Paracentesis was performed. The catheter was removed and a dressing applied.  COMPLICATIONS: None immediate  FINDINGS: A total of approximately 4.6 L of clear yellow fluid was removed. A fluid sample was sent for laboratory analysis.  IMPRESSION: Successful ultrasound guided paracentesis yielding 4.6 L of ascites.  Read by: Brayton El PA-C   Electronically Signed   By: Simonne Come M.D.   On: 04/19/2015 12:00   US Paracentesis  04/17/2015   INDICATION: Cirrhosis, hepatitis-C, recurrent ascites. Request is made for diagnostic and therapeutic paracentesis.  EXAM: ULTRASOUND-GUIDED DIAGNOSTIC AND THERAPEUTIC PARACENTESIS  COMPARISON:  Prior paracentesis on 04/05/2015  MEDICATIONS: None.  COMPLICATIONS: None immediate  TECHNIQUE: Informed written consent was obtained from the patient after a discussion of the risks, benefits and alternatives to treatment. A timeout was performed prior to the initiation of the procedure.   Initial ultrasound scanning demonstrates a large amount of ascites within the left lower abdominal quadrant. The left lower abdomen was prepped and draped in the usual sterile fashion. 1% lidocaine was used for local anesthesia. Under direct ultrasound guidance, a 19 gauge, 10-cm, Yueh catheter was introduced. An ultrasound image was saved for documentation purposed. The paracentesis was performed. The catheter was removed and a dressing was applied. The patient tolerated the procedure well without immediate post procedural complication.  FINDINGS: A total of approximately 9 liters of turbid, light yellow fluid was removed. Samples were sent to the laboratory as requested by the clinical team.  IMPRESSION: Successful ultrasound-guided diagnostic and therapeutic paracentesis yielding 9 liters of peritoneal fluid.  Read by: Jeananne Rama, PA-C   Electronically Signed   By: Gilmer Mor D.O.   On: 04/17/2015 12:32   US Paracentesis  04/05/2015   INDICATION: Hepatitis C, cirrhosis, recurrent ascites and request for paracentesis.  EXAM: ULTRASOUND-GUIDED PARACENTESIS  COMPARISON:  Paracentesis 04/04/15.  MEDICATIONS: None.  COMPLICATIONS: None immediate  TECHNIQUE: Informed written consent was obtained from the patient after a discussion of the risks, benefits and alternatives to treatment. A timeout was performed prior to the initiation of the procedure.  Initial ultrasound scanning demonstrates a large amount of ascites within the right lower abdominal quadrant. The right lower abdomen was prepped and draped in the usual sterile fashion. 1% lidocaine was used for local anesthesia.  Under direct ultrasound guidance, a 19 gauge, 7-cm, Yueh catheter was introduced. An ultrasound image was saved for documentation purposed. The paracentesis was performed. The catheter was removed and a dressing was applied. The patient tolerated the procedure well without immediate post procedural  complication.  FINDINGS: A total of  approximately 6.2 liters of cloudy yellow fluid was removed.  IMPRESSION: Successful ultrasound-guided paracentesis yielding 6.2 liters of peritoneal fluid.  Read By:  Pattricia Boss PA-C   Electronically Signed   By: Gilmer Mor D.O.   On: 04/05/2015 17:27   US Paracentesis  04/04/2015   INDICATION: Hepatitis C, cirrhosis, recurrent ascites and request for paracentesis.  EXAM: ULTRASOUND-GUIDED PARACENTESIS  COMPARISON:  Paracentesis 03/23/2015.  MEDICATIONS: None.  COMPLICATIONS: None immediate  TECHNIQUE: Informed written consent was obtained from the patient after a discussion of the risks, benefits and alternatives to treatment. A timeout was performed prior to the initiation of the procedure.  Initial ultrasound scanning demonstrates a large amount of ascites within the left lower abdominal quadrant. The left lower abdomen was prepped and draped in the usual sterile fashion. 1% lidocaine was used for local anesthesia.  Under direct ultrasound guidance, a 19 gauge, 7-cm, Yueh catheter was introduced. An ultrasound image was saved for documentation purposed. The paracentesis was performed. The catheter was removed and a dressing was applied. The patient tolerated the procedure well without immediate post procedural complication.  FINDINGS: A total of approximately 5 liters of cloudy yellow fluid was removed. Samples were sent to the laboratory as requested by the clinical team.  IMPRESSION: Successful ultrasound-guided paracentesis yielding 5 liters of peritoneal fluid.  Read By:  Pattricia Boss PA-C   Electronically Signed   By: Simonne Come M.D.   On: 04/04/2015 14:44   US Paracentesis  03/23/2015   INDICATION: Hepatitis C, cirrhosis, recurrent ascites and request for paracentesis.  EXAM: ULTRASOUND-GUIDED PARACENTESIS  COMPARISON:  Paracentesis 03/21/15.  MEDICATIONS: None.  COMPLICATIONS: None immediate  TECHNIQUE: Informed written consent was obtained from the patient after a discussion of the risks,  benefits and alternatives to treatment. A timeout was performed prior to the initiation of the procedure.  Initial ultrasound scanning demonstrates a large amount of ascites within the left lower abdominal quadrant. The left lower abdomen was prepped and draped in the usual sterile fashion. 1% lidocaine with epinephrine was used for local anesthesia.  Under direct ultrasound guidance, a 19 gauge, 10-cm, Yueh catheter was introduced. An ultrasound image was saved for documentation purposed. The paracentesis was performed. The catheter was removed and a dressing was applied. The patient tolerated the procedure well without immediate post procedural complication.  FINDINGS: A total of approximately 4.5 liters of serous fluid was removed.  IMPRESSION: Successful ultrasound-guided paracentesis yielding 4.5 liters of peritoneal fluid.  Read By:  Pattricia Boss PA-C   Electronically Signed   By: Gilmer Mor D.O.   On: 03/23/2015 12:12   Dg Chest Portable 1 View  04/16/2015   CLINICAL DATA:  Abdominal swelling. Paracentesis last week. Subsequent encounter.  EXAM: PORTABLE CHEST 1 VIEW  COMPARISON:  Chest radiographs 04/03/2015 and 03/21/2015.  FINDINGS: 1849 hours. The degree of inspiration is improved compared with the prior study. There is improved linear atelectasis at both lung bases. No edema, confluent airspace opacity or significant pleural effusion identified. The heart size and mediastinal contours are stable.  IMPRESSION: Interval improved basilar aeration and atelectasis. No acute findings.   Electronically Signed   By: Carey Bullocks M.D.   On: 04/16/2015 19:14    CBC  Recent Labs Lab 04/16/15 1549 04/19/15 0634 04/20/15 0513  WBC 6.8 7.4 7.7  HGB 14.3 13.4 13.0  HCT 42.8 40.7 38.6*  PLT 290 266 291  MCV 68.5* 68.8* 69.2*  MCH 22.9*  22.6* 23.3*  MCHC 33.4 32.9 33.7  RDW 21.4* 21.3* 21.3*    Chemistries   Recent Labs Lab 04/16/15 1549 04/18/15 0542 04/19/15 0634 04/20/15 0513  NA  133* 134* 130* 130*  K 3.5 4.0 3.9 3.9  CL 99* 99* 94* 94*  CO2 GLUCOSE 145* 156* 129* 128*  BUN CREATININE 1.25* 1.30* 1.20 1.07  CALCIUM 8.4* 8.2* 7.9* 8.2*  AST 57* 49* 50*  --   ALT --   ALKPHOS 144* 106 112  --   BILITOT 2.0* 1.3* 1.3*  --    ------------------------------------------------------------------------------------------------------------------ estimated creatinine clearance is 74 mL/min (by C-G formula based on Cr of 1.07). ------------------------------------------------------------------------------------------------------------------ No results for input(s): HGBA1C in the last 72 hours. ------------------------------------------------------------------------------------------------------------------ No results for input(s): CHOL, HDL, LDLCALC, TRIG, CHOLHDL, LDLDIRECT in the last 72 hours. ------------------------------------------------------------------------------------------------------------------ No results for input(s): TSH, T4TOTAL, T3FREE, THYROIDAB in the last 72 hours.  Invalid input(s): FREET3 ------------------------------------------------------------------------------------------------------------------ No results for input(s): VITAMINB12, FOLATE, FERRITIN, TIBC, IRON, RETICCTPCT in the last 72 hours.  Coagulation profile  Recent Labs Lab 04/17/15 0616  INR 1.40    No results for input(s): DDIMER in the last 72 hours.  Cardiac Enzymes No results for input(s): CKMB, TROPONINI, MYOGLOBIN in the last 168 hours.  Invalid input(s): CK ------------------------------------------------------------------------------------------------------------------ Invalid input(s): POCBNP    Curtis Estrada D.O. on 04/20/2015 at 2:03 PM  Between 7am to 7pm - Pager - [phone]  After 7pm go to [email or web address] - password [email or web address]  And look for the night coverage person covering for me after hours  [facility]  Group Office  [phone]

## 2015-04-20 NOTE — Progress Notes (Signed)
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 Penicillins     Childhood Has received Rocephin w/o reaction    Patient Measurements: Height:  (170.2 cm) Weight: 81.4 kg (179 lb 7.3 oz) IBW/kg (Calculated) : 66.1  Vital Signs: Temp: 98 F (36.7 C) (10/07 0507) Temp Source: Oral (10/07 0507) BP: 86/61 mmHg (10/07 0507) Pulse Rate: 98 (10/07 0507)   Intake/Output from previous day: 10/06 0701 - 10/07 0700 In: 740 [P.O.:240; IV Piggyback:500] Out: 875 [Urine:875] Intake/Output from this shift:    Labs:  Recent Labs  04/18/15 0542 04/19/15 0634 04/20/15 0513  WBC  --  7.4 7.7  HGB  --  13.4 13.0  PLT  --  266 291  CREATININE 1.30* 1.20 1.07   Estimated CrCl: 65- 75 ml/min  Microbiology: 10/6: peritoneal fluid: px 10/4: peritoneal fluid: GPC in clusters "to early to grow" 10/3 BCX: ngtd   ABX:  Vancomycin: 10/6>>  Assessment: Curtis Estrada with 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 Continue current vancomycin dose of 1000 mg q 12H 2. Level at [facility] if continued 3. Monitor renal fxn closely in setting of frequent large volume paracentesis and marginal BP 4. Narrow abx as feasible  Pollyann Samples, PharmD, BCPS 04/20/2015, 8:25 AM Pager: [phone]

## 2015-04-21 LAB — CULTURE, BLOOD (ROUTINE X 2)
Culture: NO GROWTH
Culture: NO GROWTH

## 2015-04-21 LAB — GLUCOSE, CAPILLARY
Glucose-Capillary: 231 mg/dL — ABNORMAL HIGH (ref 65–99)
Glucose-Capillary: 154 mg/dL — ABNORMAL HIGH (ref 65–99)

## 2015-04-21 MED ORDER — BENZONATATE 200 MG PO CAPS: 200.0000 mg | ORAL_CAPSULE | Freq: Two times a day (BID) | ORAL | Status: DC | PRN

## 2015-04-21 NOTE — Discharge Summary (Signed)
Physician Discharge Summary  Curtis Estrada VQQ:[id number] DOB: August 02, 1955 DOA: 04/16/2015  PCP: [facility]  Admit date: 04/16/2015 Discharge date: 04/21/2015  Time spent: 45 minutes  Recommendations for Outpatient Follow-up:  Patient will be discharged to home.  Patient will need to follow up with primary care provider within one week of discharge, Repeat BMP.  Patient should continue medications as prescribed.  Patient should follow a heart healthy diet. Patient has follow up appointment with the [facility] on 04/24/2015.  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 Shelly Flatten Curtis Estrada is a 59 y.o. Male. hhe reports feeling well at time of discharge. Patient was discharged from the hospital for decompensated hepatic cirrhosis on 04/06/2015.Curtis Estrada Patient states that partially 3 days after going home he began feeling short of breath and noticed he was swelling more in his abdomen and lower extremity. Patient states he's been compliant with his new medical regimen since discharge. States he's unable to exactly which pills he takes but states that he feels his pillbox every week by reading the label on his bottles and takes them accordingly. Patient states his last alcoholic beverages one month ago. States he had bloody diarrhea 1 since discharge but currently is without change in bowel habits,. Ongoing cough for 5 months that is no worse today.  Symptoms are constant and getting worse. Denies any chest pain, fevers, abdominal pain, confusion, headache, neck stiffness, dysuria, rash.  Hospital Course:  Decompensated hepatic cirrhosis -Patient was readmitted for nearly identical complaints. (Previous admission 9/20-9/23/2016). MELD score 16. Pt decompensating for past 7 days including 27lb wt gain. No evidence of SBP at this time, despite elevated lactic acid (2.21). Unsure if pt totally compliant w/ home regimen. Last ETOH >34mo ago -US guided paracentesis 04/17/2015, 9L removed -Continue Lasix, spironolactone, lactulose, Xifaxan, propranolol -Blood cultures show no growth to date -Fluid culture now shows SCN (Spoke with Dr. Luciana Axe, agrees, likely contaminant.) -Continue vancomycin empirically, however no need for further antibiotics.  -Repeat paracentesis 04/19/2015, removal 4.6L, culture shows no growth  -Spoke with the [place] Kathryne Sharper), patient has GI follow up with Dr. Quinn Axe on 04/24/2015 at 3pm -PT consulted, recommended wheelchair  Respiratory decompensation:  -Improving, will wean off of supplemental O2 -Likely secondary to ascites/volume overload -CXR w/o evidence of acute process.  -Last Echo 09/2014 w/ 55% EF and Grade 1 diastolic dysfunction -DDimer was elevated, 3.65 -Given AKI, obtained VQ scan which was negative for PE -repeat Lactic acid 2.5 (appears to be consistently elevated) -BNP 16.3  AKI -Baseline creatinine 0.9, currently 1.07.  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 Anticipate worsening due to diuresis.  -Repeat BMP in one week  Diabetes mellitus with neuropathy -A1c 8.4 on 03/2015 -Continue Lantus, ISS, CBG monitoring  -Continue gabapentin  GERD -continue PPI  Procedures  US guided paracentesis  Consults  None  Discharge Exam: Filed Vitals:   04/21/15 0911  BP:  100/72  Pulse: 112  Temp:   Resp:     Exam  General: Well developed,  well nourished, NAD  HEENT: NCAT, mucous membranes moist. Scleral icterus  Cardiovascular: S1 S2 auscultated, RRR, 2/6SEM  Respiratory: Clear to auscultation bilaterally  Abdomen: Soft, nontender, distended, + bowel sounds  Extremities: warm dry without cyanosis clubbing. +1 LE edema B/L  Neuro: AAOx3, nonfocal  Psych: Normal affect and demeanor, pleasant  Discharge Instructions      Discharge Instructions    Discharge instructions    Complete by:  As directed   Patient will be discharged to home.  Patient will need to follow up with primary care provider within one week of discharge, Repeat BMP.  Patient should continue medications as prescribed.  Patient should follow a heart healthy diet. Patient has follow up appointment with the [facility] on 04/24/2015.            Medication List    TAKE these medications        albuterol 108 (90 BASE) MCG/ACT inhaler  Commonly known as:  PROVENTIL HFA;VENTOLIN HFA  Inhale 2 puffs into the lungs every 2 (two) hours as needed for wheezing or shortness of breath (or coughing).     benzonatate 200 MG capsule  Commonly known as:  TESSALON  Take 1 capsule (200 mg total) by mouth 2 (two) times daily as needed for cough.     folic acid 1 MG tablet  Commonly known as:  FOLVITE  Take 1 tablet (1 mg total) by mouth daily.     furosemide 80 MG tablet  Commonly known as:  LASIX  Take 1 tablet (80 mg total) by mouth 2 (two) times daily.     gabapentin 300 MG capsule  Commonly known as:  NEURONTIN  Take 600 mg by mouth 3 (three) times daily.     insulin aspart 100 UNIT/ML injection  Commonly known as:  novoLOG  Inject 0-6 Units into the skin 3 (three) times daily with meals. 0-6 Units, Subcutaneous, 3 times daily with meals  CBG < 70: implement hypoglycemia protocol; CBG 70 - 120: 0 units; CBG 121 - 150: 1 unit; CBG 151 - 200: 2 units; CBG 201 - 250: 3 units; CBG 251 - 300: 5 units; CBG 301 - 350: 7 units; CBG 351 - 400: 9 units;  CBG > 400: call MD     insulin detemir 100 UNIT/ML injection  Commonly known as:  LEVEMIR  Inject 0.18 mLs (18 Units total) into the skin at bedtime.     lactulose 10 GM/15ML solution  Commonly known as:  CHRONULAC  Take 15 mLs (10 g total) by mouth 2 (two) times daily.     Magnesium Oxide 420 MG Tabs  Take 1 tablet by mouth daily.     multivitamin with minerals Tabs tablet  Take 1 tablet by mouth daily.     pantoprazole 40 MG tablet  Commonly known as:  PROTONIX  Take 40 mg by mouth 2 (two) times daily.     polyvinyl alcohol 1.4 % ophthalmic solution  Commonly known as:  LIQUIFILM TEARS  Place 1 drop into both eyes daily as needed (dry eyes).     propranolol 10 MG tablet  Commonly known as:  INDERAL  Take 1 tablet (10 mg total) by mouth 2 (two) times daily.     rifaximin 550 MG Tabs tablet  Commonly known as:  XIFAXAN  Take 1 tablet (550 mg total) by mouth 2 (two) times  daily.     spironolactone 50 MG tablet  Commonly known as:  ALDACTONE  Take 1 tablet (50 mg total) by mouth daily.       Allergies  Allergen Reactions  . Penicillins     Childhood Has received Rocephin w/o reaction   Follow-up Information    Follow up with [facility] On 04/24/2015.   Why:  at 3pm   Contact information:   Appointment with Gastroenterologist      Follow up with [facility].   Why:  PT RN HHA . Will call in 24 to 48 hours to set up first [facility] visit   Contact information:   [address] [place] [address] [phone]       [facility]   Why:  Call to get transportation to appointments. She is your Librarian, academic information:   [phone] [phone]       Follow up with [facility].   Specialty:  General Practice   Why:  Home Health Physical Therapy and Registered Nurse.    Contact information:   [address] [place] [address] [phone]        The results of  significant diagnostics from this hospitalization (including imaging, microbiology, ancillary and laboratory) are listed below for reference.    Significant Diagnostic Studies: Dg Chest 1 View  04/18/2015   CLINICAL DATA:  Cough and shortness of Breath  EXAM: CHEST 1 VIEW  COMPARISON:  April 16, 2015  FINDINGS: There is mild scarring in the bases. There is no edema or consolidation. The heart size and pulmonary vascularity are within normal limits. No adenopathy. No bone lesions.  IMPRESSION: Stable scarring in the bases.  No edema or consolidation.   Electronically Signed   By: Bretta Bang III M.D.   On: 04/18/2015 17:52   Dg Chest 2 View  04/03/2015   CLINICAL DATA:  Shortness of breath.  EXAM: CHEST  2 VIEW  COMPARISON:  March 21, 2015.  FINDINGS: Hypoinflation of the lungs is noted. No pneumothorax or significant pleural effusion is noted. Stable bibasilar linear densities are noted most consistent with scarring or possibly atelectasis. Bony thorax is intact.  IMPRESSION: Hypoinflation of the lungs with stable bibasilar opacities consistent with scarring or subsegmental atelectasis.   Electronically Signed   By: Lupita Raider, M.D.   On: 04/03/2015 15:05   Nm Pulmonary Perf And Vent  04/18/2015   CLINICAL DATA:  Difficulty breathing  EXAM: NUCLEAR MEDICINE VENTILATION - PERFUSION LUNG SCAN  Views: Anterior, posterior, left lateral, right lateral, RPO, LPO, RAO, LAO- ventilation and perfusion  Radionuclide: Technetium 58m DTPA-ventilation; Technetium 27m macroaggregated albumin-perfusion  Dose:  40.5 mCi-ventilation; 6.2 mCi-perfusion  Route of administration: Inhalation -ventilation ; intravenous-perfusion  COMPARISON:  Chest radiograph April 16, 2015  FINDINGS: Ventilation: Radiotracer uptake is homogeneous and symmetric bilaterally without focal defect.  Perfusion: Radiotracer uptake is homogeneous and symmetric bilaterally without focal defect.  IMPRESSION: No ventilation or perfusion  defects. This study constitutes a very low probability of pulmonary embolus.   Electronically Signed   By: Bretta Bang III M.D.   On: 04/18/2015 17:47   US Paracentesis  04/19/2015   CLINICAL DATA:  Decompensated hepatic cirrhosis. Recurrent ascites. Request diagnostic and therapeutic paracentesis.  EXAM: ULTRASOUND GUIDED PARACENTESIS  COMPARISON:  Previous paracentesis 04/17/2015  PROCEDURE: An ultrasound guided paracentesis was thoroughly discussed with the patient and questions answered. The benefits, risks,  alternatives and complications were also discussed. The patient understands and wishes to proceed with the procedure. Written consent was obtained.  Ultrasound was performed to localize and mark an adequate pocket of fluid in the left lower quadrant of the abdomen. The area was then prepped and draped in the normal sterile fashion. 1% Lidocaine was used for local anesthesia. Under ultrasound guidance a 19 gauge Yueh catheter was introduced. Paracentesis was performed. The catheter was removed and a dressing applied.  COMPLICATIONS: None immediate  FINDINGS: A total of approximately 4.6 L of clear yellow fluid was removed. A fluid sample was sent for laboratory analysis.  IMPRESSION: Successful ultrasound guided paracentesis yielding 4.6 L of ascites.  Read by: Brayton El PA-C   Electronically Signed   By: Simonne Come M.D.   On: 04/19/2015 12:00   US Paracentesis  04/17/2015   INDICATION: Cirrhosis, hepatitis-C, recurrent ascites. Request is made for diagnostic and therapeutic paracentesis.  EXAM: ULTRASOUND-GUIDED DIAGNOSTIC AND THERAPEUTIC PARACENTESIS  COMPARISON:  Prior paracentesis on 04/05/2015  MEDICATIONS: None.  COMPLICATIONS: None immediate  TECHNIQUE: Informed written consent was obtained from the patient after a discussion of the risks, benefits and alternatives to treatment. A timeout was performed prior to the initiation of the procedure.  Initial ultrasound scanning demonstrates  a large amount of ascites within the left lower abdominal quadrant. The left lower abdomen was prepped and draped in the usual sterile fashion. 1% lidocaine was used for local anesthesia. Under direct ultrasound guidance, a 19 gauge, 10-cm, Yueh catheter was introduced. An ultrasound image was saved for documentation purposed. The paracentesis was performed. The catheter was removed and a dressing was applied. The patient tolerated the procedure well without immediate post procedural complication.  FINDINGS: A total of approximately 9 liters of turbid, light yellow fluid was removed. Samples were sent to the laboratory as requested by the clinical team.  IMPRESSION: Successful ultrasound-guided diagnostic and therapeutic paracentesis yielding 9 liters of peritoneal fluid.  Read by: Jeananne Rama, PA-C   Electronically Signed   By: Gilmer Mor D.O.   On: 04/17/2015 12:32   US Paracentesis  04/05/2015   INDICATION: Hepatitis C, cirrhosis, recurrent ascites and request for paracentesis.  EXAM: ULTRASOUND-GUIDED PARACENTESIS  COMPARISON:  Paracentesis 04/04/15.  MEDICATIONS: None.  COMPLICATIONS: None immediate  TECHNIQUE: Informed written consent was obtained from the patient after a discussion of the risks, benefits and alternatives to treatment. A timeout was performed prior to the initiation of the procedure.  Initial ultrasound scanning demonstrates a large amount of ascites within the right lower abdominal quadrant. The right lower abdomen was prepped and draped in the usual sterile fashion. 1% lidocaine was used for local anesthesia.  Under direct ultrasound guidance, a 19 gauge, 7-cm, Yueh catheter was introduced. An ultrasound image was saved for documentation purposed. The paracentesis was performed. The catheter was removed and a dressing was applied. The patient tolerated the procedure well without immediate post procedural complication.  FINDINGS: A total of approximately 6.2 liters of cloudy yellow  fluid was removed.  IMPRESSION: Successful ultrasound-guided paracentesis yielding 6.2 liters of peritoneal fluid.  Read By:  Pattricia Boss PA-C   Electronically Signed   By: Gilmer Mor D.O.   On: 04/05/2015 17:27   US Paracentesis  04/04/2015   INDICATION: Hepatitis C, cirrhosis, recurrent ascites and request for paracentesis.  EXAM: ULTRASOUND-GUIDED PARACENTESIS  COMPARISON:  Paracentesis 03/23/2015.  MEDICATIONS: None.  COMPLICATIONS: None immediate  TECHNIQUE: Informed written consent was obtained from the patient after a  discussion of the risks, benefits and alternatives to treatment. A timeout was performed prior to the initiation of the procedure.  Initial ultrasound scanning demonstrates a large amount of ascites within the left lower abdominal quadrant. The left lower abdomen was prepped and draped in the usual sterile fashion. 1% lidocaine was used for local anesthesia.  Under direct ultrasound guidance, a 19 gauge, 7-cm, Yueh catheter was introduced. An ultrasound image was saved for documentation purposed. The paracentesis was performed. The catheter was removed and a dressing was applied. The patient tolerated the procedure well without immediate post procedural complication.  FINDINGS: A total of approximately 5 liters of cloudy yellow fluid was removed. Samples were sent to the laboratory as requested by the clinical team.  IMPRESSION: Successful ultrasound-guided paracentesis yielding 5 liters of peritoneal fluid.  Read By:  Pattricia Boss PA-C   Electronically Signed   By: Simonne Come M.D.   On: 04/04/2015 14:44   US Paracentesis  03/23/2015   INDICATION: Hepatitis C, cirrhosis, recurrent ascites and request for paracentesis.  EXAM: ULTRASOUND-GUIDED PARACENTESIS  COMPARISON:  Paracentesis 03/21/15.  MEDICATIONS: None.  COMPLICATIONS: None immediate  TECHNIQUE: Informed written consent was obtained from the patient after a discussion of the risks, benefits and alternatives to treatment. A  timeout was performed prior to the initiation of the procedure.  Initial ultrasound scanning demonstrates a large amount of ascites within the left lower abdominal quadrant. The left lower abdomen was prepped and draped in the usual sterile fashion. 1% lidocaine with epinephrine was used for local anesthesia.  Under direct ultrasound guidance, a 19 gauge, 10-cm, Yueh catheter was introduced. An ultrasound image was saved for documentation purposed. The paracentesis was performed. The catheter was removed and a dressing was applied. The patient tolerated the procedure well without immediate post procedural complication.  FINDINGS: A total of approximately 4.5 liters of serous fluid was removed.  IMPRESSION: Successful ultrasound-guided paracentesis yielding 4.5 liters of peritoneal fluid.  Read By:  Pattricia Boss PA-C   Electronically Signed   By: Gilmer Mor D.O.   On: 03/23/2015 12:12   Dg Chest Portable 1 View  04/16/2015   CLINICAL DATA:  Abdominal swelling. Paracentesis last week. Subsequent encounter.  EXAM: PORTABLE CHEST 1 VIEW  COMPARISON:  Chest radiographs 04/03/2015 and 03/21/2015.  FINDINGS: 1849 hours. The degree of inspiration is improved compared with the prior study. There is improved linear atelectasis at both lung bases. No edema, confluent airspace opacity or significant pleural effusion identified. The heart size and mediastinal contours are stable.  IMPRESSION: Interval improved basilar aeration and atelectasis. No acute findings.   Electronically Signed   By: Carey Bullocks M.D.   On: 04/16/2015 19:14    Microbiology: Recent Results (from the past 240 hour(s))  Culture, blood (routine x 2)     Status: None   Collection Time: 04/16/15  9:01 PM  Result Value Ref Range Status   Specimen Description BLOOD RIGHT ANTECUBITAL  Final   Special Requests BOTTLES DRAWN AEROBIC AND ANAEROBIC 10CC   Final   Culture NO GROWTH 5 DAYS  Final   Report Status 04/21/2015 FINAL  Final  Culture,  blood (routine x 2)     Status: None   Collection Time: 04/16/15  9:09 PM  Result Value Ref Range Status   Specimen Description BLOOD RIGHT HAND  Final   Special Requests IN PEDIATRIC BOTTLE 1.5CC  Final   Culture NO GROWTH 5 DAYS  Final   Report Status 04/21/2015 FINAL  Final  Culture, body fluid-bottle     Status: None (Preliminary result)   Collection Time: 04/17/15 10:53 AM  Result Value Ref Range Status   Specimen Description FLUID PERITONEAL  Final   Special Requests BOTTLES DRAWN AEROBIC AND ANAEROBIC 10CC  Final   Gram Stain   Final    GRAM POSITIVE COCCI IN CLUSTERS AEROBIC BOTTLE ONLY CRITICAL RESULT CALLED TO, READ BACK BY AND VERIFIED WITH: K CAMPBELL RN 1900 04/18/15 A BROWNING    Culture   Final    STAPHYLOCOCCUS SPECIES (COAGULASE NEGATIVE) SUSCEPTIBILITIES TO FOLLOW    Report Status PENDING  Incomplete  Gram stain     Status: None   Collection Time: 04/17/15 10:53 AM  Result Value Ref Range Status   Specimen Description FLUID PERITONEAL  Final   Special Requests NONE  Final   Gram Stain   Final    RARE WBC PRESENT, PREDOMINANTLY MONONUCLEAR NO ORGANISMS SEEN GRAM STAIN REVIEWED-AGREE WITH RESULT SYARBOROUGH    Report Status 04/17/2015 FINAL  Final  Culture, body fluid-bottle     Status: None (Preliminary result)   Collection Time: 04/19/15 11:22 AM  Result Value Ref Range Status   Specimen Description FLUID ABDOMEN PERITONEAL  Final   Special Requests NONE  Final   Culture NO GROWTH 2 DAYS  Final   Report Status PENDING  Incomplete  Gram stain     Status: None   Collection Time: 04/19/15 11:22 AM  Result Value Ref Range Status   Specimen Description FLUID ABDOMEN PERITONEAL  Final   Special Requests NONE  Final   Gram Stain   Final    MODERATE WBC PRESENT, PREDOMINANTLY MONONUCLEAR NO ORGANISMS SEEN    Report Status 04/19/2015 FINAL  Final     Labs: Basic Metabolic Panel:  Recent Labs Lab 04/16/15 1549 04/18/15 0542 04/19/15 0634  04/20/15 0513  NA 133* 134* 130* 130*  K 3.5 4.0 3.9 3.9  CL 99* 99* 94* 94*  CO2 GLUCOSE 145* 156* 129* 128*  BUN CREATININE 1.25* 1.30* 1.20 1.07  CALCIUM 8.4* 8.2* 7.9* 8.2*   Liver Function Tests:  Recent Labs Lab 04/16/15 1549 04/18/15 0542 04/19/15 0634  AST 57* 49* 50*  ALT ALKPHOS 144* 106 112  BILITOT 2.0* 1.3* 1.3*  PROT 6.6 5.6* 5.4*  ALBUMIN 2.3* 1.7* 1.7*    Recent Labs Lab 04/16/15 1549  LIPASE 40    Recent Labs Lab 04/16/15 1813  AMMONIA 45*   CBC:  Recent Labs Lab 04/16/15 1549 04/19/15 0634 04/20/15 0513  WBC 6.8 7.4 7.7  HGB 14.3 13.4 13.0  HCT 42.8 40.7 38.6*  MCV 68.5* 68.8* 69.2*  PLT 290 266 291   Cardiac Enzymes: No results for input(s): CKTOTAL, CKMB, CKMBINDEX, TROPONINI in the last 168 hours. BNP: BNP (last 3 results)  Recent Labs  09/19/14 1920 02/12/15 0907 04/16/15 2101  BNP 29.8 19.0 16.3    ProBNP (last 3 results) No results for input(s): PROBNP in the last 8760 hours.  CBG:  Recent Labs Lab 04/20/15 0756 04/20/15 1150 04/20/15 1644 04/20/15 2116 04/21/15 0751  GLUCAP 130* 207* 151* 219* 231*       Signed:  Naylani Bradner  [facility] 04/21/2015, 10:42 AM

## 2015-04-21 NOTE — Care Management Note (Signed)
Case Management Note  Patient Details  Name: Curtis Estrada MRN: [id number] Date of Birth: [birth year]  Subjective/Objective:                  Decompensated hepatic cirrhosis  Action/Plan: CM spoke to pt at the bedside who states that he may be going home today. Pt denies DME needs other than the wheelchair that is to be delivered to his home. Pt says that he is fine without it until it is delivered and does okay with walking short distances, mainly needs it for community mobility. Pt says that he needs help with a ride home. CM called SW, [place], and possible taxi or bus pass to be given to pt.   Alona Bene (sister) @ [phone] came to the nurses station and happened to be here with someone else and she said she could take him home. CM notified SW. No further discharge planning needs communicated and pt says that he lives at home alone but has enough support and feels safe at home.   Expected Discharge Date:  04/21/15               Expected Discharge Plan:  Home w Home Health Services  In-House Referral:  Clinical Social Work  Discharge planning Services  CM Consult  Post Acute Care Choice:  Home Health, Durable Medical Equipment Choice offered to:  Patient  DME Arranged:  Government social research officer DME Agency:  [employer], [place]  HH Arranged:  PT, RN [facility] Agency:  [facility], [place]  Status of Service:  In process, will continue to follow  Medicare Important Message Given:    Date Medicare IM Given:    Medicare IM give by:    Date Additional Medicare IM Given:    Additional Medicare Important Message give by:     If discussed at Long Length of Stay Meetings, dates discussed:    Additional Comments:  Darcel Smalling, RN 04/21/2015, 9:53 AM

## 2015-04-21 NOTE — Progress Notes (Signed)
Pt is refusing bed alarm. Educated on its importance, pt still refuses. Bed alarm not on at this time.

## 2015-04-21 NOTE — Discharge Instructions (Signed)
Ascites °Ascites is a collection of excess fluid in the abdomen. Ascites can range from mild to severe. It can get worse without treatment. °CAUSES °Possible causes include: °· Cirrhosis. This is the most common cause of ascites. °· Infection or inflammation in the abdomen. °· Cancer in the abdomen. °· Heart failure. °· Kidney disease. °· Inflammation of the pancreas. °· Clots in the veins of the liver. °SIGNS AND SYMPTOMS °Signs and symptoms may include: °· A feeling of fullness in your abdomen. This is common. °· An increase in the size of your abdomen or your waist. °· Swelling in your legs. °· Swelling of the scrotum in men. °· Difficulty breathing. °· Abdominal pain. °· Sudden weight gain. °If the condition is mild, you may not have symptoms. °DIAGNOSIS °To make a diagnosis, your health care provider will: °· Ask about your medical history. °· Perform a physical exam. °· Order imaging tests, such as an ultrasound or CT scan of your abdomen. °TREATMENT °Treatment depends on the cause of the ascites. It may include: °· Taking a pill to make you urinate. This is called a water pill (diuretic pill). °· Strictly reducing your salt (sodium) intake. Salt can cause extra fluid to be kept in the body, and this makes ascites worse. °· Having a procedure to remove fluid from your abdomen (paracentesis). °· Having a procedure to transfer fluid from your abdomen into a vein. °· Having a procedure that connects two of the major veins within your liver and relieves pressure on your liver (TIPS procedure). °Ascites may go away or improve with treatment of the condition that caused it.  °HOME CARE INSTRUCTIONS °· Keep track of your weight. To do this, weigh yourself at the same time every day and record your weight. °· Keep track of how much you drink and any changes in the amount you urinate. °· Follow any instructions that your health care provider gives you about how much to drink. °· Try not to eat salty (high-sodium)  foods. °· Take medicines only as directed by your health care provider. °· Keep all follow-up visits as directed by your health care provider. This is important. °· Report any changes in your health to your health care provider, especially if you develop new symptoms or your symptoms get worse. °SEEK MEDICAL CARE IF: °· Your gain more than 3 pounds in 3 days. °· Your abdominal size or your waist size increases. °· You have new swelling in your legs. °· The swelling in your legs gets worse. °SEEK IMMEDIATE MEDICAL CARE IF: °· You develop a fever. °· You develop confusion. °· You develop new or worsening difficulty breathing. °· You develop new or worsening abdominal pain. °· You develop new or worsening swelling in the scrotum (in men). °  °This information is not intended to replace advice given to you by your health care provider. Make sure you discuss any questions you have with your health care provider. °  °Document Released: 06/30/2005 Document Revised: 07/21/2014 Document Reviewed: 01/27/2014 °Elsevier Interactive Patient Education ©2016 [employer] ° °

## 2015-04-21 NOTE — Progress Notes (Signed)
Patient discharge teaching given, including activity, diet, follow-up appoints, and medications. Patient verbalized understanding of all discharge instructions. IV access was d/c'd. Vitals are stable. Skin is intact except as charted in most recent assessments. Pt to be escorted out by NT, to be driven home by family. 

## 2015-04-23 ENCOUNTER — Emergency Department (HOSPITAL_COMMUNITY): Payer: Medicaid Other

## 2015-04-23 ENCOUNTER — Inpatient Hospital Stay (HOSPITAL_COMMUNITY)
Admission: EM | Admit: 2015-04-23 | Discharge: 2015-04-30 | DRG: 441 | Disposition: A | Discharge status: Home Health | Payer: Medicaid Other | Attending: Internal Medicine | Admitting: Internal Medicine

## 2015-04-23 ENCOUNTER — Encounter (HOSPITAL_COMMUNITY): Payer: Self-pay | Admitting: *Deleted

## 2015-04-23 DIAGNOSIS — I10 Essential (primary) hypertension: Secondary | ICD-10-CM | POA: Diagnosis present

## 2015-04-23 DIAGNOSIS — J42 Unspecified chronic bronchitis: Secondary | ICD-10-CM | POA: Diagnosis present

## 2015-04-23 DIAGNOSIS — I959 Hypotension, unspecified: Secondary | ICD-10-CM | POA: Diagnosis present

## 2015-04-23 DIAGNOSIS — F17210 Nicotine dependence, cigarettes, uncomplicated: Secondary | ICD-10-CM | POA: Diagnosis present

## 2015-04-23 DIAGNOSIS — E1165 Type 2 diabetes mellitus with hyperglycemia: Secondary | ICD-10-CM | POA: Diagnosis present

## 2015-04-23 DIAGNOSIS — R188 Other ascites: Secondary | ICD-10-CM | POA: Diagnosis present

## 2015-04-23 DIAGNOSIS — R0902 Hypoxemia: Secondary | ICD-10-CM | POA: Diagnosis present

## 2015-04-23 DIAGNOSIS — G629 Polyneuropathy, unspecified: Secondary | ICD-10-CM | POA: Diagnosis present

## 2015-04-23 DIAGNOSIS — E118 Type 2 diabetes mellitus with unspecified complications: Secondary | ICD-10-CM

## 2015-04-23 DIAGNOSIS — G9340 Encephalopathy, unspecified: Secondary | ICD-10-CM | POA: Diagnosis present

## 2015-04-23 DIAGNOSIS — R410 Disorientation, unspecified: Secondary | ICD-10-CM | POA: Diagnosis present

## 2015-04-23 DIAGNOSIS — B1920 Unspecified viral hepatitis C without hepatic coma: Secondary | ICD-10-CM

## 2015-04-23 DIAGNOSIS — E8770 Fluid overload, unspecified: Secondary | ICD-10-CM | POA: Diagnosis present

## 2015-04-23 DIAGNOSIS — E872 Acidosis, unspecified: Secondary | ICD-10-CM | POA: Diagnosis present

## 2015-04-23 DIAGNOSIS — F1020 Alcohol dependence, uncomplicated: Secondary | ICD-10-CM | POA: Diagnosis present

## 2015-04-23 DIAGNOSIS — K7031 Alcoholic cirrhosis of liver with ascites: Secondary | ICD-10-CM | POA: Diagnosis present

## 2015-04-23 DIAGNOSIS — M1990 Unspecified osteoarthritis, unspecified site: Secondary | ICD-10-CM | POA: Diagnosis present

## 2015-04-23 DIAGNOSIS — IMO0002 Reserved for concepts with insufficient information to code with codable children: Secondary | ICD-10-CM | POA: Diagnosis present

## 2015-04-23 DIAGNOSIS — E869 Volume depletion, unspecified: Secondary | ICD-10-CM | POA: Diagnosis present

## 2015-04-23 DIAGNOSIS — Z8711 Personal history of peptic ulcer disease: Secondary | ICD-10-CM | POA: Diagnosis not present

## 2015-04-23 DIAGNOSIS — E785 Hyperlipidemia, unspecified: Secondary | ICD-10-CM | POA: Diagnosis present

## 2015-04-23 DIAGNOSIS — R140 Abdominal distension (gaseous): Secondary | ICD-10-CM

## 2015-04-23 DIAGNOSIS — N179 Acute kidney failure, unspecified: Secondary | ICD-10-CM | POA: Diagnosis present

## 2015-04-23 DIAGNOSIS — E871 Hypo-osmolality and hyponatremia: Secondary | ICD-10-CM | POA: Diagnosis not present

## 2015-04-23 DIAGNOSIS — D689 Coagulation defect, unspecified: Secondary | ICD-10-CM | POA: Diagnosis present

## 2015-04-23 DIAGNOSIS — K767 Hepatorenal syndrome: Secondary | ICD-10-CM | POA: Diagnosis present

## 2015-04-23 DIAGNOSIS — Z880 Allergy status to penicillin: Secondary | ICD-10-CM | POA: Diagnosis not present

## 2015-04-23 DIAGNOSIS — R4182 Altered mental status, unspecified: Secondary | ICD-10-CM

## 2015-04-23 DIAGNOSIS — E8809 Other disorders of plasma-protein metabolism, not elsewhere classified: Secondary | ICD-10-CM | POA: Diagnosis present

## 2015-04-23 DIAGNOSIS — Z79899 Other long term (current) drug therapy: Secondary | ICD-10-CM | POA: Diagnosis not present

## 2015-04-23 DIAGNOSIS — K219 Gastro-esophageal reflux disease without esophagitis: Secondary | ICD-10-CM | POA: Diagnosis present

## 2015-04-23 DIAGNOSIS — Z515 Encounter for palliative care: Secondary | ICD-10-CM

## 2015-04-23 DIAGNOSIS — K652 Spontaneous bacterial peritonitis: Secondary | ICD-10-CM | POA: Diagnosis present

## 2015-04-23 DIAGNOSIS — K7682 Hepatic encephalopathy: Secondary | ICD-10-CM | POA: Diagnosis present

## 2015-04-23 DIAGNOSIS — K7290 Hepatic failure, unspecified without coma: Secondary | ICD-10-CM | POA: Diagnosis present

## 2015-04-23 DIAGNOSIS — Z794 Long term (current) use of insulin: Secondary | ICD-10-CM | POA: Diagnosis not present

## 2015-04-23 DIAGNOSIS — Z9119 Patient's noncompliance with other medical treatment and regimen: Secondary | ICD-10-CM | POA: Diagnosis not present

## 2015-04-23 LAB — CULTURE, BODY FLUID-BOTTLE

## 2015-04-23 LAB — URINALYSIS, ROUTINE W REFLEX MICROSCOPIC
BILIRUBIN URINE: NEGATIVE
GLUCOSE, UA: NEGATIVE mg/dL
Hgb urine dipstick: NEGATIVE
KETONES UR: NEGATIVE mg/dL
Leukocytes, UA: NEGATIVE
NITRITE: NEGATIVE
PH: 5.5 (ref 5.0–8.0)
PROTEIN: NEGATIVE mg/dL
Specific Gravity, Urine: 1.015 (ref 1.005–1.030)
Urobilinogen, UA: 1 mg/dL (ref 0.0–1.0)

## 2015-04-23 LAB — COMPREHENSIVE METABOLIC PANEL
ALBUMIN: 2.2 g/dL — AB (ref 3.5–5.0)
ALBUMIN: 2.2 g/dL — AB (ref 3.5–5.0)
ALK PHOS: 103 U/L (ref 38–126)
ALT: 32 U/L (ref 17–63)
ALT: 35 U/L (ref 17–63)
AST: 77 U/L — AB (ref 15–41)
AST: 69 U/L — AB (ref 15–41)
Alkaline Phosphatase: 108 U/L (ref 38–126)
Anion gap: 10 (ref 5–15)
Anion gap: 8 (ref 5–15)
BUN: 18 mg/dL (ref 6–20)
BUN: 18 mg/dL (ref 6–20)
CALCIUM: 8.5 mg/dL — AB (ref 8.9–10.3)
CHLORIDE: 95 mmol/L — AB (ref 101–111)
CHLORIDE: 97 mmol/L — AB (ref 101–111)
CO2: 23 mmol/L (ref 22–32)
CO2: 24 mmol/L (ref 22–32)
CREATININE: 1.71 mg/dL — AB (ref 0.61–1.24)
Calcium: 8.5 mg/dL — ABNORMAL LOW (ref 8.9–10.3)
Creatinine, Ser: 1.75 mg/dL — ABNORMAL HIGH (ref 0.61–1.24)
GFR calc Af Amer: 48 mL/min — ABNORMAL LOW (ref >60)
GFR calc Af Amer: 47 mL/min — ABNORMAL LOW (ref >60)
GFR calc non Af Amer: 41 mL/min — ABNORMAL LOW (ref >60)
GFR calc non Af Amer: 40 mL/min — ABNORMAL LOW (ref >60)
GLUCOSE: 232 mg/dL — AB (ref 65–99)
GLUCOSE: 185 mg/dL — AB (ref 65–99)
Potassium: 4.4 mmol/L (ref 3.5–5.1)
Potassium: 4.4 mmol/L (ref 3.5–5.1)
SODIUM: 128 mmol/L — AB (ref 135–145)
Sodium: 129 mmol/L — ABNORMAL LOW (ref 135–145)
Total Bilirubin: 1.3 mg/dL — ABNORMAL HIGH (ref 0.3–1.2)
Total Bilirubin: 1.6 mg/dL — ABNORMAL HIGH (ref 0.3–1.2)
Total Protein: 6.7 g/dL (ref 6.5–8.1)
Total Protein: 6.4 g/dL — ABNORMAL LOW (ref 6.5–8.1)

## 2015-04-23 LAB — BRAIN NATRIURETIC PEPTIDE: B Natriuretic Peptide: 25.7 pg/mL (ref 0.0–100.0)

## 2015-04-23 LAB — BODY FLUID CELL COUNT WITH DIFFERENTIAL
Eos, Fluid: 0 %
LYMPHS FL: 53 %
MONOCYTE-MACROPHAGE-SEROUS FLUID: 47 % — AB (ref 50–90)
NEUTROPHIL FLUID: 0 % (ref 0–25)
Total Nucleated Cell Count, Fluid: 431 cu mm (ref 0–1000)

## 2015-04-23 LAB — CBC WITH DIFFERENTIAL/PLATELET
BASOS ABS: 0.1 10*3/uL (ref 0.0–0.1)
BASOS PCT: 1 %
EOS ABS: 0.2 10*3/uL (ref 0.0–0.7)
Eosinophils Relative: 2 %
HCT: 44.7 % (ref 39.0–52.0)
Hemoglobin: 14.9 g/dL (ref 13.0–17.0)
Lymphocytes Relative: 21 %
Lymphs Abs: 2.3 10*3/uL (ref 0.7–4.0)
MCH: 23.1 pg — AB (ref 26.0–34.0)
MCHC: 33.3 g/dL (ref 30.0–36.0)
MCV: 69.4 fL — AB (ref 78.0–100.0)
MONO ABS: 2.1 10*3/uL — AB (ref 0.1–1.0)
Monocytes Relative: 19 %
NEUTROS ABS: 6.4 10*3/uL (ref 1.7–7.7)
Neutrophils Relative %: 57 %
PLATELETS: 321 10*3/uL (ref 150–400)
RBC: 6.44 MIL/uL — ABNORMAL HIGH (ref 4.22–5.81)
RDW: 21.3 % — AB (ref 11.5–15.5)
WBC: 11.1 10*3/uL — ABNORMAL HIGH (ref 4.0–10.5)

## 2015-04-23 LAB — ETHANOL

## 2015-04-23 LAB — CULTURE, BODY FLUID W GRAM STAIN -BOTTLE

## 2015-04-23 LAB — I-STAT CG4 LACTIC ACID, ED: LACTIC ACID, VENOUS: 5 mmol/L — AB (ref 0.5–2.0)

## 2015-04-23 LAB — AMMONIA: Ammonia: 98 umol/L — ABNORMAL HIGH (ref 9–35)

## 2015-04-23 LAB — LACTATE DEHYDROGENASE: LDH: 221 U/L — AB (ref 98–192)

## 2015-04-23 MED ORDER — SPIRONOLACTONE 25 MG PO TABS
100.0000 mg | ORAL_TABLET | Freq: Every day | ORAL | Status: DC
  Filled 2015-04-23: qty 4

## 2015-04-23 MED ORDER — PROPRANOLOL HCL 10 MG PO TABS
10.0000 mg | ORAL_TABLET | Freq: Two times a day (BID) | ORAL | Status: DC
  Administered 2015-04-24 – 2015-04-30 (×10): 10 mg via ORAL
  Filled 2015-04-23 (×15): qty 1

## 2015-04-23 MED ORDER — SODIUM CHLORIDE 0.9 % IV BOLUS (SEPSIS)
500.0000 mL | Freq: Once | INTRAVENOUS | Status: AC
  Administered 2015-04-23: 500 mL via INTRAVENOUS

## 2015-04-23 MED ORDER — ONDANSETRON HCL 4 MG PO TABS
4.0000 mg | ORAL_TABLET | Freq: Four times a day (QID) | ORAL | Status: DC | PRN
  Administered 2015-04-29: 4 mg via ORAL
  Filled 2015-04-23: qty 1

## 2015-04-23 MED ORDER — LACTULOSE 10 GM/15ML PO SOLN
30.0000 g | Freq: Once | ORAL | Status: DC
  Filled 2015-04-23: qty 45

## 2015-04-23 MED ORDER — INSULIN ASPART 100 UNIT/ML ~~LOC~~ SOLN
0.0000 [IU] | Freq: Four times a day (QID) | SUBCUTANEOUS | Status: DC
  Administered 2015-04-24: 3 [IU] via SUBCUTANEOUS
  Administered 2015-04-24: 2 [IU] via SUBCUTANEOUS

## 2015-04-23 MED ORDER — RIFAXIMIN 550 MG PO TABS
550.0000 mg | ORAL_TABLET | Freq: Two times a day (BID) | ORAL | Status: DC
Start: 2015-04-23 — End: 2015-04-30
  Administered 2015-04-24 – 2015-04-30 (×13): 550 mg via ORAL
  Filled 2015-04-23 (×16): qty 1

## 2015-04-23 MED ORDER — DEXTROSE 5 % IV SOLN
2.0000 g | INTRAVENOUS | Status: DC
  Administered 2015-04-23 – 2015-04-27 (×5): 2 g via INTRAVENOUS
  Filled 2015-04-23 (×7): qty 2

## 2015-04-23 MED ORDER — CETYLPYRIDINIUM CHLORIDE 0.05 % MT LIQD
7.0000 mL | Freq: Two times a day (BID) | OROMUCOSAL | Status: DC
  Administered 2015-04-24 – 2015-04-30 (×12): 7 mL via OROMUCOSAL

## 2015-04-23 MED ORDER — LACTULOSE 10 GM/15ML PO SOLN: 10.0000 g | Freq: Two times a day (BID) | ORAL | Status: DC

## 2015-04-23 MED ORDER — PANTOPRAZOLE SODIUM 40 MG IV SOLR
40.0000 mg | Freq: Two times a day (BID) | INTRAVENOUS | Status: DC
  Administered 2015-04-24 (×3): 40 mg via INTRAVENOUS
  Filled 2015-04-23 (×3): qty 40

## 2015-04-23 MED ORDER — ONDANSETRON HCL 4 MG/2ML IJ SOLN: 4.0000 mg | Freq: Four times a day (QID) | INTRAMUSCULAR | Status: DC | PRN

## 2015-04-23 MED ORDER — FOLIC ACID 1 MG PO TABS
1.0000 mg | ORAL_TABLET | Freq: Every day | ORAL | Status: DC
  Administered 2015-04-24 – 2015-04-30 (×7): 1 mg via ORAL
  Filled 2015-04-23 (×8): qty 1

## 2015-04-23 MED ORDER — FUROSEMIDE 40 MG PO TABS
80.0000 mg | ORAL_TABLET | Freq: Two times a day (BID) | ORAL | Status: DC
  Administered 2015-04-24: 80 mg via ORAL
  Filled 2015-04-23: qty 2

## 2015-04-23 MED ORDER — ALBUMIN HUMAN 25 % IV SOLN
50.0000 g | Freq: Once | INTRAVENOUS | Status: AC
  Administered 2015-04-23: 50 g via INTRAVENOUS
  Filled 2015-04-23: qty 200

## 2015-04-23 MED ORDER — SODIUM CHLORIDE 0.9 % IJ SOLN
3.0000 mL | Freq: Two times a day (BID) | INTRAMUSCULAR | Status: DC
  Administered 2015-04-26 – 2015-04-30 (×8): 3 mL via INTRAVENOUS

## 2015-04-23 MED ORDER — LIDOCAINE-EPINEPHRINE 1 %-1:100000 IJ SOLN
10.0000 mL | Freq: Once | INTRAMUSCULAR | Status: AC
  Administered 2015-04-23: 10 mL
  Filled 2015-04-23: qty 2
  Filled 2015-04-23: qty 1

## 2015-04-23 NOTE — Progress Notes (Signed)
Brief Pharmacy note: Rocephin per Pharmacy  Rocephin 2gm q24 hr ordered for SBP prophylaxis/treatment  Rocephin does not need adjustment for renal function so Pharmacy will sign off.  Thank you, Otho Bellows PharmD Pager [phone] 04/23/2015, 10:04 PM

## 2015-04-23 NOTE — ED Notes (Signed)
Curtis Estrada (sister)- [phone]  Curtis Estrada (sister)- [phone]

## 2015-04-23 NOTE — ED Notes (Signed)
Per EMS, pt from home, reports abd pain, had 9L removed from his abdomen x 2 weeks ago. Per pt's family, pt has been increasingly confused. Has been living wit his sister. Pt has hx of cirrhosis.   l

## 2015-04-23 NOTE — ED Notes (Signed)
Notified edp and nurse the results from the Istat lactic acid

## 2015-04-23 NOTE — ED Notes (Signed)
Bed: WA04 Expected date:  Expected time:  Means of arrival:  Comments: EMS/84M/abd pain/AMS

## 2015-04-23 NOTE — ED Provider Notes (Signed)
CSN: [id number]     Arrival date & time 04/23/15  1702 History   First MD Initiated Contact with Patient 04/23/15 1850     Chief Complaint  Patient presents with  . 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 Hx limited due to patient's confusion and obtained from family member at bedside. She reports that he was just discharged from hospital 3 days ago after have 9L and later 4L therapeutic paracenteses. He has complained of generalized abd pain for the past few days and yesterday became confused which has worsened today. He lives alone and administers his own medications. She is not aware of any vomiting or cough symptoms. He has had confusion in past related to hepatic encephalopathy.  The history is provided by a relative.    Past Medical History  Diagnosis Date  . Hypertension   . 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 ETOH abuse   . Ascites 01/2013  . Hematoma 01/2013    posterior right flank from a fall  . Coagulopathy (HCC) 01/2013    secondary to liver disease.   . Anemia 02/2013  . Hyponatremia 01/2013  . Unspecified constipation 04/04/2013  . H pylori ulcer 03/05/2013  . Gastric ulcer with hemorrhage 03/03/2013  . Chronic wound of extremity-right great toe 03/03/2013  . Acute blood loss anemia 03/03/2013  . Spontaneous bacterial peritonitis (HCC) 09/20/2014  . Hyperlipidemia   . Pneumonia ? 09/2014; 02/2015  . Chronic bronchitis (HCC)   . GERD (gastroesophageal reflux disease)   . Daily headache   . Arthritis     "shoulders" (03/21/2015)  . Type II diabetes mellitus (HCC)     uncontrolled  . Hepatitis C    Past Surgical History  Procedure Laterality Date  . Tonsillectomy    . Esophagogastroduodenoscopy N/A 03/03/2013    Procedure: ESOPHAGOGASTRODUODENOSCOPY (EGD);   Surgeon: Beverley Fiedler, MD;  Location: [facility] ENDOSCOPY;  Service: Gastroenterology;  Laterality: N/A;  Bedside  . Inguinal hernia repair Right 1960's  . Paracentesis  09/2014; 02/2015; 03/21/2015   Family History  Problem Relation Age of Onset  . Dementia Mother   . Cancer - Other Father    Social History  Substance Use Topics  . Smoking status: Current Every Day Smoker -- 0.10 packs/day for 47 years    Types: Cigarettes  . Smokeless tobacco: Never Used  . Alcohol Use: Yes     Comment: 03/21/2015 "last drink was 02/15/2015)    Review of Systems  Unable to perform ROS: Mental status change      Allergies  Penicillins  Home Medications   Prior to Admission medications   Medication Sig Start Date End Date Taking? Authorizing Provider  albuterol (PROVENTIL HFA;VENTOLIN HFA) 108 (90 BASE) MCG/ACT inhaler Inhale 2 puffs into the lungs every 2 (two) hours as needed for wheezing or shortness of breath (or coughing). 05/30/14  Yes Dione Booze, MD  folic acid (FOLVITE) 1 MG tablet Take 1 tablet (1 mg total) by mouth daily. 09/21/14  Yes Maryruth Bun Rama, MD  furosemide (LASIX) 80 MG tablet Take 1 tablet (80 mg total) by mouth 2 (two) times daily. 02/19/15  Yes Catarina Hartshorn, MD  gabapentin (NEURONTIN) 300 MG capsule Take 600 mg by mouth 3 (three) times daily.   Yes Historical Provider, MD  insulin aspart (NOVOLOG) 100 UNIT/ML  injection Inject 0-6 Units into the skin 3 (three) times daily with meals. 0-6 Units, Subcutaneous, 3 times daily with meals  CBG < 70: implement hypoglycemia protocol; CBG 70 - 120: 0 units; CBG 121 - 150: 1 unit; CBG 151 - 200: 2 units; CBG 201 - 250: 3 units; CBG 251 - 300: 5 units; CBG 301 - 350: 7 units; CBG 351 - 400: 9 units; CBG > 400: call MD 03/10/13  Yes Shanker Levora Dredge, MD  lactulose (CHRONULAC) 10 GM/15ML solution Take 15 mLs (10 g total) by mouth 2 (two) times daily. 03/24/15  Yes Maryann Mikhail, DO  Magnesium Oxide 420 MG TABS Take 1 tablet by mouth daily.   Yes  Historical Provider, MD  metFORMIN (GLUCOPHAGE) 1000 MG tablet Take 1,000 mg by mouth 2 (two) times daily.   Yes Historical Provider, MD  Multiple Vitamin (MULTIVITAMIN WITH MINERALS) TABS tablet Take 1 tablet by mouth daily. 09/21/14  Yes Maryruth Bun Rama, MD  propranolol (INDERAL) 10 MG tablet Take 1 tablet (10 mg total) by mouth 2 (two) times daily. 02/19/15  Yes Catarina Hartshorn, MD  rifaximin (XIFAXAN) 550 MG TABS tablet Take 1 tablet (550 mg total) by mouth 2 (two) times daily. 03/24/15  Yes Maryann Mikhail, DO  spironolactone (ALDACTONE) 100 MG tablet Take 100 mg by mouth daily.   Yes Historical Provider, MD  spironolactone (ALDACTONE) 25 MG tablet Take 25 mg by mouth daily.   Yes Historical Provider, MD  benzonatate (TESSALON) 200 MG capsule Take 1 capsule (200 mg total) by mouth 2 (two) times daily as needed for cough. 04/21/15   Maryann Mikhail, DO  insulin detemir (LEVEMIR) 100 UNIT/ML injection Inject 0.18 mLs (18 Units total) into the skin at bedtime. 02/19/15   Catarina Hartshorn, MD  pantoprazole (PROTONIX) 40 MG tablet Take 40 mg by mouth 2 (two) times daily.     Historical Provider, MD  polyvinyl alcohol (LIQUIFILM TEARS) 1.4 % ophthalmic solution Place 1 drop into both eyes daily as needed (dry eyes).     Historical Provider, MD   BP 111/75 mmHg  Pulse 125  Temp(Src) 98 F (36.7 C) (Oral)  Resp 21  SpO2 94% Physical Exam  Constitutional:  Chronically ill appearing man, confused and uncomfortable but maintaining airway  HENT:  Head: Normocephalic and atraumatic.  dry mucous membranes  Eyes: Conjunctivae are normal. Pupils are equal, round, and reactive to light.  Neck: Neck supple.  Cardiovascular: Regular rhythm and normal heart sounds.   No murmur heard. tachycardic  Pulmonary/Chest: Effort normal.  Diminished BS b/l  Abdominal: Bowel sounds are normal.  Distended abdomen w/ fluid wave; generalized tenderness to palpation with no rebound or guarding  Musculoskeletal:  1+ b/l pitting  edema LE  Neurological:  Asleep but arousable with effort, will follow basic commands with prompting but confused and unable to answer questions  Skin: Skin is warm and dry.  Nursing note and vitals reviewed.   ED Course  .Paracentesis Date/Time: 04/23/2015 8:00 PM Performed by: Laurence Spates Authorized by: Laurence Spates Consent: Verbal consent obtained. Written consent obtained. Risks and benefits: risks, benefits and alternatives were discussed Consent given by: power of attorney (family member at bedside, patient unable to consent 2/2 AMS) Patient understanding: patient states understanding of the procedure being performed Patient consent: the patient's understanding of the procedure matches consent given Procedure consent: procedure consent matches procedure scheduled Site marked: the operative site was marked Patient identity confirmed: arm band Initial or subsequent exam: initial  Procedure purpose: diagnostic Indications: abdominal discomfort secondary to ascites and suspected peritonitis Anesthesia: local infiltration Local anesthetic: lidocaine 1% with epinephrine Anesthetic total: 1 ml Patient sedated: no Preparation: Patient was prepped and draped in the usual sterile fashion. Needle gauge: 18 Ultrasound guidance: yes Puncture site: right lower quadrant Fluid removed: 40(ml) Fluid appearance: cloudy Dressing: bandaid. Patient tolerance: Patient tolerated the procedure well with no immediate complications   (including critical care time) Labs Review Labs Reviewed  COMPREHENSIVE METABOLIC PANEL - Abnormal; Notable for the following:    Sodium 128 (*)    Chloride 95 (*)    Glucose, Bld 232 (*)    Creatinine, Ser 1.71 (*)    Calcium 8.5 (*)    Albumin 2.2 (*)    AST 77 (*)    Total Bilirubin 1.3 (*)    GFR calc non Af Amer 41 (*)    GFR calc Af Amer 48 (*)    All other components within normal limits  I-STAT CG4 LACTIC ACID, ED - Abnormal;  Notable for the following:    Lactic Acid, Venous 5.00 (*)    All other components within normal limits  BODY FLUID CULTURE  GRAM STAIN  URINE CULTURE  CULTURE, BLOOD (ROUTINE X 2)  CULTURE, BLOOD (ROUTINE X 2)  BODY FLUID CELL COUNT WITH DIFFERENTIAL  GLUCOSE, SYNOVIAL FLUID  BRAIN NATRIURETIC PEPTIDE  URINALYSIS, ROUTINE W REFLEX MICROSCOPIC (NOT AT [facility])    Imaging Review Dg Chest Port 1 View  04/23/2015   CLINICAL DATA:  Acute confusion and pain.  EXAM: PORTABLE CHEST 1 VIEW  COMPARISON:  04/18/2015 and prior radiographs  FINDINGS: This is a low volume film.  Upper limits normal heart size noted.  Pulmonary vascular congestion and mild interstitial opacities are identified- likely mild edema.  There is no evidence of pneumothorax or definite pleural effusion.  No acute bony abnormalities are identified.  IMPRESSION: Pulmonary vascular congestion and question mild interstitial edema.   Electronically Signed   By: Harmon Pier M.D.   On: 04/23/2015 19:34   I have personally reviewed and evaluated these lab results as part of my medical decision-making.   EKG Interpretation None     Medications  lidocaine-EPINEPHrine (XYLOCAINE W/EPI) 1 %-1:100000 (with pres) injection 10 mL (not administered)  sodium chloride 0.9 % bolus 500 mL (not administered)  cefTRIAXone (ROCEPHIN) 2 g in dextrose 5 % 50 mL IVPB (not administered)    MDM   Final diagnoses:  SBP (spontaneous bacterial peritonitis) (HCC)  Altered mental status, unspecified altered mental status type   59yo M w/ cirrhosis and ascites requiring recent therapeutic paracentesis who p/w several days of generalized abd pain and confusion starting yesterday. On arrival, patient confused w/ distended abdomen. He was tachycardic and slightly hypoxic requiring 2L [place]. Afebrile. Bedside US showed significant ascites. Obtained above labs including lactate as well as CXR. Obtained consent from family member for diagnostic paracentesis as I  am concerned about SBP. Performed paracentesis at bedside; see procedure note for details. Cloudy fluid obtained concerning for infection. Blood and urine cx sent and patient given small IVF bolus and ceftriaxone.  Labs notable for Na 128, Cr. 1.71, Albumin 2.2, lactate 5. Patient's AMS and h/o cirrhosis suggest hepatic encephalopathy. CXR shows pulm vascular congestion and mild interstitial edema likely due to hypoalbuminemia and 3rd spacing. Because of O2 requirement, I have judiciously replaced fluids to prevent worsening pulmonary edema from rapid repletion. Patient is occasionally lucid and I have asked nurse to perform bedside  swallow before attempting dose of PO lactulose. Pt may ultimately require PR lactulose if unsuccessful. I have spoken with hospitalist and patient admitted to general medicine, stepdown for further care.    Laurence Spates, MD 04/24/15 [id number]

## 2015-04-23 NOTE — H&P (Signed)
[facility] History and Physical  Patient: Curtis Estrada  MRN: [id number]  DOB: 1955-08-30  DOS: the patient was seen and examined on 04/23/2015 PCP: [facility]  Referring physician: Dr. Clarene Duke Chief Complaint: Confusion  HPI: Curtis Estrada is a 59 y.o. male with Past medical history of hepatic cirrhosis secondary to hepatitis C as well as alcohol, ascites, adenopathy, essential hypertension, uncontrolled diabetes mellitus, noncompliance, GERD. Patient presents with complaints of confusion. The history is limited since there was no family member available and the patient was unable to provide any significant history. Patient's family brought the patient to the hospital since he has been increasingly more confused over last few days. There was no fall no trauma no injury reported. Reportedly he may be noncompliant with his medical regimen. Patient was recently hospitalized and discharged on 04/21/2015 for similar complaint. At the time of my evaluation patient did not report any complaint but he did have some abdominal pain on arrival. No recent change in his medications reported recently.  The patient is coming from home.  At his baseline ambulates with support And is independent for most of his ADL; does manages his medication on his own.  Review of Systems: as mentioned in the history of present illness.  A comprehensive review of the other systems is negative.  Past Medical History  Diagnosis Date  . Hypertension   . 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 ETOH abuse   . Ascites 01/2013  . Hematoma 01/2013    posterior right flank from a fall  . Coagulopathy (HCC) 01/2013    secondary to liver disease.   . Anemia 02/2013  . Hyponatremia 01/2013  . Unspecified constipation 04/04/2013  . H pylori ulcer 03/05/2013  . Gastric ulcer with hemorrhage 03/03/2013  . Chronic wound of extremity-right great toe 03/03/2013  . Acute blood loss anemia 03/03/2013  .  Spontaneous bacterial peritonitis (HCC) 09/20/2014  . Hyperlipidemia   . Pneumonia ? 09/2014; 02/2015  . Chronic bronchitis (HCC)   . GERD (gastroesophageal reflux disease)   . Daily headache   . Arthritis     "shoulders" (03/21/2015)  . Type II diabetes mellitus (HCC)     uncontrolled  . Hepatitis C    Past Surgical History  Procedure Laterality Date  . Tonsillectomy    . Esophagogastroduodenoscopy N/A 03/03/2013    Procedure: ESOPHAGOGASTRODUODENOSCOPY (EGD);  Surgeon: Beverley Fiedler, MD;  Location: [facility] ENDOSCOPY;  Service: Gastroenterology;  Laterality: N/A;  Bedside  . Inguinal hernia repair Right 1960's  . Paracentesis  09/2014; 02/2015; 03/21/2015   Social History:  reports that he has been smoking Cigarettes.  He has a 4.7 pack-year smoking history. He has never used smokeless tobacco. He reports that he drinks alcohol. He reports that he does not use illicit drugs.  Allergies  Allergen Reactions  . Penicillins     Childhood Has received Rocephin w/o reaction    Family History  Problem Relation Age of Onset  . Dementia Mother   . Cancer - Other Father     Prior to Admission medications   Medication Sig Start Date End Date Taking? Authorizing Provider  albuterol (PROVENTIL HFA;VENTOLIN HFA) 108 (90 BASE) MCG/ACT inhaler Inhale 2 puffs into the lungs every 2 (two) hours as needed for wheezing or shortness of breath (or coughing). 05/30/14  Yes Dione Booze, MD  folic acid (FOLVITE) 1 MG tablet Take 1 tablet (1 mg total) by mouth daily. 09/21/14  Yes Christina  P Rama, MD  furosemide (LASIX) 80 MG tablet Take 1 tablet (80 mg total) by mouth 2 (two) times daily. 02/19/15  Yes Catarina Hartshorn, MD  gabapentin (NEURONTIN) 300 MG capsule Take 600 mg by mouth 3 (three) times daily.   Yes Historical Provider, MD  insulin aspart (NOVOLOG) 100 UNIT/ML injection Inject 0-6 Units into the skin 3 (three) times daily with meals. 0-6 Units, Subcutaneous, 3 times daily with meals  CBG < 70: implement  hypoglycemia protocol; CBG 70 - 120: 0 units; CBG 121 - 150: 1 unit; CBG 151 - 200: 2 units; CBG 201 - 250: 3 units; CBG 251 - 300: 5 units; CBG 301 - 350: 7 units; CBG 351 - 400: 9 units; CBG > 400: call MD 03/10/13  Yes Shanker Levora Dredge, MD  lactulose (CHRONULAC) 10 GM/15ML solution Take 15 mLs (10 g total) by mouth 2 (two) times daily. 03/24/15  Yes Maryann Mikhail, DO  Magnesium Oxide 420 MG TABS Take 1 tablet by mouth daily.   Yes Historical Provider, MD  metFORMIN (GLUCOPHAGE) 1000 MG tablet Take 1,000 mg by mouth 2 (two) times daily.   Yes Historical Provider, MD  Multiple Vitamin (MULTIVITAMIN WITH MINERALS) TABS tablet Take 1 tablet by mouth daily. 09/21/14  Yes Maryruth Bun Rama, MD  propranolol (INDERAL) 10 MG tablet Take 1 tablet (10 mg total) by mouth 2 (two) times daily. 02/19/15  Yes Catarina Hartshorn, MD  rifaximin (XIFAXAN) 550 MG TABS tablet Take 1 tablet (550 mg total) by mouth 2 (two) times daily. 03/24/15  Yes Maryann Mikhail, DO  spironolactone (ALDACTONE) 100 MG tablet Take 100 mg by mouth daily.   Yes Historical Provider, MD  spironolactone (ALDACTONE) 25 MG tablet Take 25 mg by mouth daily.   Yes Historical Provider, MD  benzonatate (TESSALON) 200 MG capsule Take 1 capsule (200 mg total) by mouth 2 (two) times daily as needed for cough. 04/21/15   Maryann Mikhail, DO  insulin detemir (LEVEMIR) 100 UNIT/ML injection Inject 0.18 mLs (18 Units total) into the skin at bedtime. 02/19/15   Catarina Hartshorn, MD  pantoprazole (PROTONIX) 40 MG tablet Take 40 mg by mouth 2 (two) times daily.     Historical Provider, MD  polyvinyl alcohol (LIQUIFILM TEARS) 1.4 % ophthalmic solution Place 1 drop into both eyes daily as needed (dry eyes).     Historical Provider, MD    Physical Exam: Filed Vitals:   04/23/15 1713 04/23/15 1726 04/23/15 2039  BP: 107/71 111/75 103/79  Pulse: 121 125 124  Temp: 98.2 F (36.8 C) 98 F (36.7 C)   TempSrc: Oral Oral   Resp: 18 21 19   SpO2: 99% 94% 97%    General: Alert,  Awake and not oriented, follows command. Appear in mild distress Eyes: PERRL ENT: Oral Mucosa clear moist. Neck: no JVD Cardiovascular: S1 and S2 Present, no Murmur, Peripheral Pulses Present Respiratory: Bilateral Air entry equal and Decreased,  Clear to Auscultation, no Crackles, no wheezes Abdomen: Bowel Sound present, Soft and distended, no tenderness Skin: no Rash Extremities: Bilateral Pedal edema, no calf tenderness Neurologic: Limited examination secondary to confusion, spontaneously moving all extremities bilaterally equal strength, withdraws to painful stimuli, normal speech  Labs on Admission:  CBC:  Recent Labs Lab 04/19/15 0634 04/20/15 0513 04/23/15 2111  WBC 7.4 7.7 11.1*  NEUTROABS  --   --  PENDING  HGB 13.4 13.0 14.9  HCT 40.7 38.6* 44.7  MCV 68.8* 69.2* 69.4*  PLT 266 291 321  CMP     Component Value Date/Time   NA 129* 04/23/2015 2111   K 4.4 04/23/2015 2111   CL 97* 04/23/2015 2111   CO2 24 04/23/2015 2111   GLUCOSE 185* 04/23/2015 2111   BUN 18 04/23/2015 2111   CREATININE 1.75* 04/23/2015 2111   CALCIUM 8.5* 04/23/2015 2111   PROT 6.4* 04/23/2015 2111   ALBUMIN 2.2* 04/23/2015 2111   AST 69* 04/23/2015 2111   ALT 35 04/23/2015 2111   ALKPHOS 108 04/23/2015 2111   BILITOT 1.6* 04/23/2015 2111   GFRNONAA 40* 04/23/2015 2111   GFRAA 47* 04/23/2015 2111    No results for input(s): CKTOTAL, CKMB, CKMBINDEX, TROPONINI in the last 168 hours. BNP (last 3 results)  Recent Labs  02/12/15 0907 04/16/15 2101 04/23/15 1943  BNP 19.0 16.3 25.7    ProBNP (last 3 results) No results for input(s): PROBNP in the last 8760 hours.   Radiological Exams on Admission: Dg Chest Port 1 View  04/23/2015   CLINICAL DATA:  Acute confusion and pain.  EXAM: PORTABLE CHEST 1 VIEW  COMPARISON:  04/18/2015 and prior radiographs  FINDINGS: This is a low volume film.  Upper limits normal heart size noted.  Pulmonary vascular congestion and mild interstitial  opacities are identified- likely mild edema.  There is no evidence of pneumothorax or definite pleural effusion.  No acute bony abnormalities are identified.  IMPRESSION: Pulmonary vascular congestion and question mild interstitial edema.   Electronically Signed   By: Harmon Pier M.D.   On: 04/23/2015 19:34   Assessment/Plan 1. Hepatic encephalopathy [facility]) Patient presents with complaints of confusion. Ammonia level is elevated patient does not appear to have any focal deficit. This appears most likely secondary to hepatic encephalopathy mostly secondary to noncompliance with his medical regimen. There was also some question of abdominal tenderness and suspicions for SBP. Currently the patient had diagnostic paracentesis in the ER. He also has mild acute kidney injury. With this the patient will be admitted in stepdown unit. I would monitor his sodium level. Patient has received IV fluids since he was felt to be having intravascularly volume depleted. Resume his Lasix starting tomorrow. Also resume Aldactone. Patient will receive oral lactulose. We'll closely monitor him in stepdown unit.  2.  Ascites Patient did have large volume paracentesis in the last admission. Patient also received 12.5 g of albumin after the procedure. With his acute kidney injury as well as suspicious for SBP as well as hyponatremia I would give him 50 g of albumin. Ideal dose would be 1.5 g/kg within 6 hours after diagnosing SBP. We will continue closely monitor his sodium level as well as urine output as well as serum BMP. We'll also order ultrasound-guided paracentesis in the morning. Resume Aldactone and Lasix starting tomorrow. Patient will be given IV ceftriaxone per pharmacy recommendation for SBP.  3  Essential hypertension, benign Continuing propranolol.  4  Uncontrolled type 2 diabetes mellitus (HCC) Placing the patient on every 6 hours sliding scale sensitive.  5  Decompensated hepatic cirrhosis  (HCC) Patient had a GI follow-up with Dr. Quinn Axe at [facility] tomorrow at 3 PM, but unfortunately the patient is currently hospitalized for hepatic encephalopathy. We'll request them in the morning to reschedule the appointment.  6  AKI (acute kidney injury) (HCC) This appears most likely in the setting of noncompliance with medical regimen with suspected hepatorenal syndrome. Monitor serum BMP. Resume Lasix in the morning. Use when necessary IV albumin.  7  GERD without esophagitis Continuing  Protonix currently switching to IV.  8  Lactic acidosis Most likely secondary to hepatic cirrhosis leading to poor clearance. We'll continue close monitoring.  9  Hyponatremia Most likely the setting of hepatic cirrhosis causing volume overload. We will monitor closely, and will need further workup if serum sodium worsens further.  Nutrition: Currently nothing by mouth in the setting of confusion except medication DVT Prophylaxis: mechanical compression device  Advance goals of care discussion: Full code presumed from his recent admission  Disposition: Admitted as inpatient, step-down unit.  Author: Lynden Oxford, MD [facility] Pager: [phone] 04/23/2015  If 7PM-7AM, please contact night-coverage [email or web address] Password [email or web address]

## 2015-04-24 ENCOUNTER — Inpatient Hospital Stay (HOSPITAL_COMMUNITY): Payer: Medicaid Other

## 2015-04-24 LAB — GRAM STAIN

## 2015-04-24 LAB — BASIC METABOLIC PANEL
ANION GAP: 8 (ref 5–15)
Anion gap: 11 (ref 5–15)
Anion gap: 9 (ref 5–15)
Anion gap: 8 (ref 5–15)
BUN: 19 mg/dL (ref 6–20)
BUN: 19 mg/dL (ref 6–20)
BUN: 20 mg/dL (ref 6–20)
BUN: 19 mg/dL (ref 6–20)
CALCIUM: 8.7 mg/dL — AB (ref 8.9–10.3)
CALCIUM: 8.9 mg/dL (ref 8.9–10.3)
CALCIUM: 8.8 mg/dL — AB (ref 8.9–10.3)
CHLORIDE: 97 mmol/L — AB (ref 101–111)
CHLORIDE: 97 mmol/L — AB (ref 101–111)
CHLORIDE: 98 mmol/L — AB (ref 101–111)
CO2: 23 mmol/L (ref 22–32)
CO2: 26 mmol/L (ref 22–32)
CO2: 25 mmol/L (ref 22–32)
CO2: 25 mmol/L (ref 22–32)
CREATININE: 1.68 mg/dL — AB (ref 0.61–1.24)
CREATININE: 1.58 mg/dL — AB (ref 0.61–1.24)
CREATININE: 1.54 mg/dL — AB (ref 0.61–1.24)
Calcium: 8.8 mg/dL — ABNORMAL LOW (ref 8.9–10.3)
Chloride: 99 mmol/L — ABNORMAL LOW (ref 101–111)
Creatinine, Ser: 1.49 mg/dL — ABNORMAL HIGH (ref 0.61–1.24)
GFR calc Af Amer: 53 mL/min — ABNORMAL LOW (ref >60)
GFR calc Af Amer: 54 mL/min — ABNORMAL LOW (ref >60)
GFR calc Af Amer: 57 mL/min — ABNORMAL LOW (ref >60)
GFR calc non Af Amer: 42 mL/min — ABNORMAL LOW (ref >60)
GFR calc non Af Amer: 46 mL/min — ABNORMAL LOW (ref >60)
GFR calc non Af Amer: 47 mL/min — ABNORMAL LOW (ref >60)
GFR calc non Af Amer: 49 mL/min — ABNORMAL LOW (ref >60)
GFR, EST AFRICAN AMERICAN: 49 mL/min — AB (ref >60)
GLUCOSE: 260 mg/dL — AB (ref 65–99)
Glucose, Bld: 193 mg/dL — ABNORMAL HIGH (ref 65–99)
Glucose, Bld: 169 mg/dL — ABNORMAL HIGH (ref 65–99)
Glucose, Bld: 179 mg/dL — ABNORMAL HIGH (ref 65–99)
POTASSIUM: 4 mmol/L (ref 3.5–5.1)
Potassium: 4.4 mmol/L (ref 3.5–5.1)
Potassium: 4.1 mmol/L (ref 3.5–5.1)
Potassium: 4.1 mmol/L (ref 3.5–5.1)
SODIUM: 131 mmol/L — AB (ref 135–145)
SODIUM: 132 mmol/L — AB (ref 135–145)
SODIUM: 131 mmol/L — AB (ref 135–145)
Sodium: 132 mmol/L — ABNORMAL LOW (ref 135–145)

## 2015-04-24 LAB — CBC WITH DIFFERENTIAL/PLATELET
BASOS PCT: 1 %
Basophils Absolute: 0.1 10*3/uL (ref 0.0–0.1)
EOS PCT: 2 %
Eosinophils Absolute: 0.2 10*3/uL (ref 0.0–0.7)
HEMATOCRIT: 40.6 % (ref 39.0–52.0)
Hemoglobin: 13.4 g/dL (ref 13.0–17.0)
LYMPHS ABS: 2.2 10*3/uL (ref 0.7–4.0)
Lymphocytes Relative: 22 %
MCH: 22.8 pg — ABNORMAL LOW (ref 26.0–34.0)
MCHC: 33 g/dL (ref 30.0–36.0)
MCV: 69 fL — AB (ref 78.0–100.0)
MONO ABS: 1.7 10*3/uL — AB (ref 0.1–1.0)
Monocytes Relative: 17 %
Neutro Abs: 5.6 10*3/uL (ref 1.7–7.7)
Neutrophils Relative %: 58 %
Platelets: 291 10*3/uL (ref 150–400)
RBC: 5.88 MIL/uL — AB (ref 4.22–5.81)
RDW: 20.9 % — AB (ref 11.5–15.5)
WBC: 9.8 10*3/uL (ref 4.0–10.5)

## 2015-04-24 LAB — ALBUMIN, FLUID (OTHER)

## 2015-04-24 LAB — PROTEIN, BODY FLUID: Total protein, fluid: <3 g/dL

## 2015-04-24 LAB — GLUCOSE, CAPILLARY
GLUCOSE-CAPILLARY: 156 mg/dL — AB (ref 65–99)
GLUCOSE-CAPILLARY: 204 mg/dL — AB (ref 65–99)
Glucose-Capillary: 225 mg/dL — ABNORMAL HIGH (ref 65–99)
Glucose-Capillary: 238 mg/dL — ABNORMAL HIGH (ref 65–99)

## 2015-04-24 LAB — LACTATE DEHYDROGENASE, PLEURAL OR PERITONEAL FLUID: LD, Fluid: 51 U/L — ABNORMAL HIGH (ref 3–23)

## 2015-04-24 LAB — CULTURE, BODY FLUID W GRAM STAIN -BOTTLE: Culture: NO GROWTH

## 2015-04-24 LAB — GLUCOSE, SEROUS FLUID: GLUCOSE FL: 218 mg/dL

## 2015-04-24 LAB — BODY FLUID CELL COUNT WITH DIFFERENTIAL
LYMPHS FL: 54 %
MONOCYTE-MACROPHAGE-SEROUS FLUID: 46 % — AB (ref 50–90)
Total Nucleated Cell Count, Fluid: 345 cu mm (ref 0–1000)

## 2015-04-24 LAB — CULTURE, BODY FLUID-BOTTLE

## 2015-04-24 LAB — PROTIME-INR
INR: 1.29 (ref 0.00–1.49)
Prothrombin Time: 16.3 seconds — ABNORMAL HIGH (ref 11.6–15.2)

## 2015-04-24 LAB — MRSA PCR SCREENING: MRSA by PCR: NEGATIVE

## 2015-04-24 LAB — PATHOLOGIST SMEAR REVIEW

## 2015-04-24 MED ORDER — POLYETHYLENE GLYCOL 3350 17 G PO PACK
17.0000 g | PACK | Freq: Once | ORAL | Status: AC
  Administered 2015-04-24: 17 g via ORAL
  Filled 2015-04-24: qty 1

## 2015-04-24 MED ORDER — ACETAMINOPHEN 325 MG PO TABS
650.0000 mg | ORAL_TABLET | Freq: Four times a day (QID) | ORAL | Status: DC | PRN
  Administered 2015-04-24 – 2015-04-29 (×6): 650 mg via ORAL
  Filled 2015-04-24 (×6): qty 2

## 2015-04-24 MED ORDER — INSULIN ASPART 100 UNIT/ML ~~LOC~~ SOLN
0.0000 [IU] | Freq: Three times a day (TID) | SUBCUTANEOUS | Status: DC
  Administered 2015-04-24 (×2): 3 [IU] via SUBCUTANEOUS
  Administered 2015-04-25: 2 [IU] via SUBCUTANEOUS
  Administered 2015-04-25: 3 [IU] via SUBCUTANEOUS

## 2015-04-24 MED ORDER — LACTULOSE ENEMA
300.0000 mL | Freq: Two times a day (BID) | ORAL | Status: DC
  Administered 2015-04-24: 300 mL via RECTAL
  Filled 2015-04-24 (×4): qty 300

## 2015-04-24 MED ORDER — ALBUMIN HUMAN 25 % IV SOLN
50.0000 g | Freq: Once | INTRAVENOUS | Status: AC
  Administered 2015-04-24: 50 g via INTRAVENOUS
  Filled 2015-04-24: qty 50

## 2015-04-24 MED ORDER — LACTULOSE 10 GM/15ML PO SOLN
30.0000 g | Freq: Three times a day (TID) | ORAL | Status: DC
  Administered 2015-04-24 – 2015-04-30 (×16): 30 g via ORAL
  Filled 2015-04-24 (×3): qty 60
  Filled 2015-04-24: qty 45
  Filled 2015-04-24 (×3): qty 60
  Filled 2015-04-24: qty 45
  Filled 2015-04-24: qty 60
  Filled 2015-04-24 (×2): qty 45
  Filled 2015-04-24 (×8): qty 60

## 2015-04-24 MED ORDER — SODIUM CHLORIDE 0.9 % IV SOLN
INTRAVENOUS | Status: DC
Start: 2015-04-24 — End: 2015-04-26
  Administered 2015-04-24 – 2015-04-25 (×3): via INTRAVENOUS

## 2015-04-24 MED ORDER — BISACODYL 10 MG RE SUPP: 10.0000 mg | Freq: Every day | RECTAL | Status: DC | PRN
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
## 2015-04-24 NOTE — Progress Notes (Signed)
I have received palliative consult for [facility] and will visit with Curtis Estrada tomorrow morning. Thank you for this consult.   Yong Channel, NP Palliative Medicine Team Pager # [phone] (M-F 8a-5p) Team Phone # [phone] (Nights/Weekends)

## 2015-04-24 NOTE — Procedures (Signed)
US guided diagnostic/therapeutic paracentesis performed yielding 7.4 liters turbid, amber fluid. A portion of the fluid was sent to the lab for preordered studies. No immediate complications.
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
## 2015-04-24 NOTE — Progress Notes (Signed)
[facility] PROGRESS NOTE  Curtis Estrada ZOX:[id number] DOB: 08-21-1955 DOA: 04/23/2015 PCP: [facility]  Assessment/Plan: 59 y/o male with PMH of HTN, DM, Alcoholism, Hep C, Liver cirrhosis complicated with ascites, recurrent paracentesis, encephalopathy recently admitted (10/3-10/8), non compliance is readmitted with confusion, hepatic encephalopathy   1. Acute encephalopathy. Hepatic encephalopathy with advanced liver cirrhosis. Ammonia is 98 on admission. Neuro exam is no focal.  -restarted lactulose, rifaximin. Mental status is improving, patient appears intravascular volume depleted cont gentle IVF for 24 hrs. . monitor  2. Advanced liver cirrhosis, recurrent ascitis. Hypoalbuminemia, coagulopathy. Alcoholism. Hep C. -exam mild distended abdomen. Obtain KUB, we will cont paracentesis as needed. Patient was started on IV ceftriaxone for ? sbp. Wbc -431 but neutrophil 0. F/u cultures    -patient is intravascular volume depleted on admission. Cont gentle IVF for 24 hours. Resume lasix/spiro on 10/12. 3. AKI. Cont gentle IVF. Unfortunately, patient remains at risk for HRS due to advanced liver cirrhosis. Monitor  4. DM. Cont iSS for now  Prognosis is guarded due to advanced liver cirrhosis.    Code Status: full Family Communication:  D/w patient, RN (indicate person spoken with, relationship, and if by phone, the number) Disposition Plan: pend PT   Consultants:  none  Procedures:  paracenthesis   Antibiotics:  None  (indicate start date, and stop date if known)  HPI/Subjective: Alert, confused to time   Objective: Filed Vitals:   04/24/15 0700  BP: 118/89  Pulse:   Temp:   Resp: 19    Intake/Output Summary (Last 24 hours) at 04/24/15 0829 Last data filed at 04/24/15 0200  Gross per 24 hour  Intake    250 ml  Output      0 ml  Net    250 ml   Filed Weights   04/23/15 2208 04/24/15 0720  Weight: 82.4 kg (181 lb 10.5 oz) 85.7 kg (188 lb 15 oz)     Exam:   General:  No distress   Cardiovascular: s1,s2 rrr  Respiratory: CTA BL   Abdomen: soft, distended. nt   Musculoskeletal: no leg edema   Data Reviewed: Basic Metabolic Panel:  Recent Labs Lab 04/20/15 0513 04/23/15 1736 04/23/15 2111 04/24/15 0100 04/24/15 0532  NA 130* 128* 129* 131* 132*  K 3.9 4.4 4.4 4.4 4.1  CL 94* 95* 97* 97* 97*  CO2 26 23 24 23 26   GLUCOSE 128* 232* 185* 193* 169*  BUN 11 18 18 19 19   CREATININE 1.07 1.71* 1.75* 1.68* 1.58*  CALCIUM 8.2* 8.5* 8.5* 8.7* 8.9   Liver Function Tests:  Recent Labs Lab 04/18/15 0542 04/19/15 0634 04/23/15 1736 04/23/15 2111  AST 49* 50* 77* 69*  ALT 21 24 32 35  ALKPHOS 106 112 103 108  BILITOT 1.3* 1.3* 1.3* 1.6*  PROT 5.6* 5.4* 6.7 6.4*  ALBUMIN 1.7* 1.7* 2.2* 2.2*   No results for input(s): LIPASE, AMYLASE in the last 168 hours.  Recent Labs Lab 04/23/15 2111  AMMONIA 98*   CBC:  Recent Labs Lab 04/19/15 0634 04/20/15 0513 04/23/15 2111 04/24/15 0100  WBC 7.4 7.7 11.1* 9.8  NEUTROABS  --   --  6.4 5.6  HGB 13.4 13.0 14.9 13.4  HCT 40.7 38.6* 44.7 40.6  MCV 68.8* 69.2* 69.4* 69.0*  PLT 266 291 321 291   Cardiac Enzymes: No results for input(s): CKTOTAL, CKMB, CKMBINDEX, TROPONINI in the last 168 hours. BNP (last 3 results)  Recent Labs  02/12/15 0907 04/16/15 2101 04/23/15 1943  BNP 19.0 16.3 25.7    ProBNP (last 3 results) No results for input(s): PROBNP in the last 8760 hours.  CBG:  Recent Labs Lab 04/20/15 2116 04/21/15 0751 04/21/15 1141 04/24/15 0029 04/24/15 0711  GLUCAP 219* 231* 154* 225* 156*    Recent Results (from the past 240 hour(s))  Culture, blood (routine x 2)     Status: None   Collection Time: 04/16/15  9:01 PM  Result Value Ref Range Status   Specimen Description BLOOD RIGHT ANTECUBITAL  Final   Special Requests BOTTLES DRAWN AEROBIC AND ANAEROBIC 10CC   Final   Culture NO GROWTH 5 DAYS  Final   Report Status 04/21/2015 FINAL   Final  Culture, blood (routine x 2)     Status: None   Collection Time: 04/16/15  9:09 PM  Result Value Ref Range Status   Specimen Description BLOOD RIGHT HAND  Final   Special Requests IN PEDIATRIC BOTTLE 1.5CC  Final   Culture NO GROWTH 5 DAYS  Final   Report Status 04/21/2015 FINAL  Final  Culture, body fluid-bottle     Status: None   Collection Time: 04/17/15 10:53 AM  Result Value Ref Range Status   Specimen Description FLUID PERITONEAL  Final   Special Requests BOTTLES DRAWN AEROBIC AND ANAEROBIC 10CC  Final   Gram Stain   Final    GRAM POSITIVE COCCI IN CLUSTERS AEROBIC BOTTLE ONLY CRITICAL RESULT CALLED TO, READ BACK BY AND VERIFIED WITH: K CAMPBELL RN 1900 04/18/15 A BROWNING    Culture   Final    MICROCOCCUS SPECIES Standardized susceptibility testing for this organism is not available.    Report Status 04/23/2015 FINAL  Final  Gram stain     Status: None   Collection Time: 04/17/15 10:53 AM  Result Value Ref Range Status   Specimen Description FLUID PERITONEAL  Final   Special Requests NONE  Final   Gram Stain   Final    RARE WBC PRESENT, PREDOMINANTLY MONONUCLEAR NO ORGANISMS SEEN GRAM STAIN REVIEWED-AGREE WITH RESULT SYARBOROUGH    Report Status 04/17/2015 FINAL  Final  Culture, body fluid-bottle     Status: None (Preliminary result)   Collection Time: 04/19/15 11:22 AM  Result Value Ref Range Status   Specimen Description FLUID ABDOMEN PERITONEAL  Final   Special Requests NONE  Final   Culture NO GROWTH 4 DAYS  Final   Report Status PENDING  Incomplete  Gram stain     Status: None   Collection Time: 04/19/15 11:22 AM  Result Value Ref Range Status   Specimen Description FLUID ABDOMEN PERITONEAL  Final   Special Requests NONE  Final   Gram Stain   Final    MODERATE WBC PRESENT, PREDOMINANTLY MONONUCLEAR NO ORGANISMS SEEN    Report Status 04/19/2015 FINAL  Final  MRSA PCR Screening     Status: None   Collection Time: 04/23/15 10:23 PM  Result Value Ref  Range Status   MRSA by PCR NEGATIVE NEGATIVE Final    Comment:        The GeneXpert MRSA Assay (FDA approved for NASAL specimens only), is one component of a comprehensive MRSA colonization surveillance program. It is not intended to diagnose MRSA infection nor to guide or monitor treatment for MRSA infections.      Studies: Dg Chest Port 1 View  04/23/2015   CLINICAL DATA:  Acute confusion and pain.  EXAM: PORTABLE CHEST 1 VIEW  COMPARISON:  04/18/2015 and prior radiographs  FINDINGS: This  is a low volume film.  Upper limits normal heart size noted.  Pulmonary vascular congestion and mild interstitial opacities are identified- likely mild edema.  There is no evidence of pneumothorax or definite pleural effusion.  No acute bony abnormalities are identified.  IMPRESSION: Pulmonary vascular congestion and question mild interstitial edema.   Electronically Signed   By: Harmon Pier M.D.   On: 04/23/2015 19:34    Scheduled Meds: . antiseptic oral rinse  7 mL Mouth Rinse BID  . cefTRIAXone (ROCEPHIN)  IV  2 g Intravenous Q24H  . folic acid  1 mg Oral Daily  . furosemide  80 mg Oral BID  . insulin aspart  0-9 Units Subcutaneous 4 times per day  . lactulose  300 mL Rectal BID  . pantoprazole (PROTONIX) IV  40 mg Intravenous Q12H  . propranolol  10 mg Oral BID  . rifaximin  550 mg Oral BID  . sodium chloride  3 mL Intravenous Q12H  . spironolactone  100 mg Oral Daily   Continuous Infusions:   Principal Problem:   Hepatic encephalopathy (HCC) Active Problems:   Hepatitis C   Ascites   Essential hypertension, benign   Uncontrolled type 2 diabetes mellitus (HCC)   Decompensated hepatic cirrhosis (HCC)   AKI (acute kidney injury) (HCC)   GERD without esophagitis   Lactic acidosis   Hyponatremia    Time spent: >35 minutes     Esperanza Sheets  [facility] Pager [phone]. If 7PM-7AM, please contact night-coverage at [email or web address], password [facility] 04/24/2015, 8:29 AM   LOS: 1 day

## 2015-04-24 NOTE — Progress Notes (Signed)
Inpatient Diabetes Program Recommendations  AACE/ADA: New Consensus Statement on Inpatient Glycemic Control (2015)  Target Ranges:  Prepandial:   less than 140 mg/dL      Peak postprandial:   less than 180 mg/dL (1-2 hours)      Critically ill patients:  140 - 180 mg/dL   Review of Glycemic Control  Diabetes history: DM2 Outpatient Diabetes medications: Levemir 18 units QHS, Novolog s/s Current orders for Inpatient glycemic control: Novolog moderate tidwc Results for SANFORD, LINDBLAD (MRN [id number]) as of 04/24/2015 13:50  Ref. Range 04/24/2015 00:29 04/24/2015 07:11 04/24/2015 11:55  Glucose-Capillary Latest Ref Range: 65-99 mg/dL 225 (H) 156 (H) 204 (H)   Results for TORI, CUPPS (MRN [id number]) as of 04/24/2015 13:50  Ref. Range 04/24/2015 12:40  Potassium Latest Ref Range: 3.5-5.1 mmol/L 4.0  Chloride Latest Ref Range: 101-111 mmol/L 99 (L)  CO2 Latest Ref Range: 22-32 mmol/L 25  BUN Latest Ref Range: 6-20 mg/dL 19  Creatinine Latest Ref Range: 0.61-1.24 mg/dL 1.49 (H)  Calcium Latest Ref Range: 8.9-10.3 mg/dL 8.8 (L)  EGFR (Non-African Amer.) Latest Ref Range: >60 mL/min 49 (L)  EGFR (African American) Latest Ref Range: >60 mL/min 57 (L)  Glucose Latest Ref Range: 65-99 mg/dL 260 (H)  Anion gap Latest Ref Range: 5-15  8  Results for UNDREA, SHIPES (MRN [id number]) as of 04/24/2015 13:50  Ref. Range 04/24/2015 12:40  Glucose Latest Ref Range: 65-99 mg/dL 260 (H)    Inpatient Diabetes Program Recommendations: Insulin - Basal: Add 1/2 of home dose Levemir - 9 units QHS Insulin - Meal Coverage: Add Novolog 3 units tidwc for meal coverage insulin if pt eats > 50% meal   Will continue to follow while inpatient. Thank you. Lorenda Peck, RD, LDN, CDE Inpatient Diabetes Coordinator [phone]

## 2015-04-24 NOTE — Care Management Note (Signed)
Case Management Note  Patient Details  Name: Curtis Estrada MRN: [id number] Date of Birth: [birth year]  Subjective/Objective:                 Hepatic encephalopathy    Action/Plan: will follow   Expected Discharge Date:   (unknown)               Expected Discharge Plan:  Home/Self Care  In-House Referral:  Clinical Social Work  Discharge planning Services  CM Consult  Post Acute Care Choice:    Choice offered to:     DME Arranged:    DME Agency:     HH Arranged:    HH Agency:     Status of Service:  In process, will continue to follow  Medicare Important Message Given:    Date Medicare IM Given:    Medicare IM give by:    Date Additional Medicare IM Given:    Additional Medicare Important Message give by:     If discussed at Long Length of Stay Meetings, dates discussed:    Additional Comments:  Golda Acre, RN 04/24/2015, 12:11 PM

## 2015-04-25 ENCOUNTER — Inpatient Hospital Stay (HOSPITAL_COMMUNITY): Payer: Medicaid Other

## 2015-04-25 DIAGNOSIS — N179 Acute kidney failure, unspecified: Secondary | ICD-10-CM

## 2015-04-25 DIAGNOSIS — K7290 Hepatic failure, unspecified without coma: Principal | ICD-10-CM

## 2015-04-25 LAB — BASIC METABOLIC PANEL
ANION GAP: 9 (ref 5–15)
BUN: 14 mg/dL (ref 6–20)
CHLORIDE: 102 mmol/L (ref 101–111)
CO2: 22 mmol/L (ref 22–32)
Calcium: 8.5 mg/dL — ABNORMAL LOW (ref 8.9–10.3)
Creatinine, Ser: 1.08 mg/dL (ref 0.61–1.24)
GFR calc Af Amer: >60 mL/min (ref >60)
GLUCOSE: 162 mg/dL — AB (ref 65–99)
POTASSIUM: 4.1 mmol/L (ref 3.5–5.1)
Sodium: 133 mmol/L — ABNORMAL LOW (ref 135–145)

## 2015-04-25 LAB — CBC
HEMATOCRIT: 38.1 % — AB (ref 39.0–52.0)
HEMOGLOBIN: 12.9 g/dL — AB (ref 13.0–17.0)
MCH: 23.4 pg — AB (ref 26.0–34.0)
MCHC: 33.9 g/dL (ref 30.0–36.0)
MCV: 69 fL — AB (ref 78.0–100.0)
Platelets: 251 10*3/uL (ref 150–400)
RBC: 5.52 MIL/uL (ref 4.22–5.81)
RDW: 21.3 % — ABNORMAL HIGH (ref 11.5–15.5)
WBC: 8.8 10*3/uL (ref 4.0–10.5)

## 2015-04-25 LAB — GLUCOSE, CAPILLARY
GLUCOSE-CAPILLARY: 162 mg/dL — AB (ref 65–99)
GLUCOSE-CAPILLARY: 178 mg/dL — AB (ref 65–99)
Glucose-Capillary: 185 mg/dL — ABNORMAL HIGH (ref 65–99)
Glucose-Capillary: 210 mg/dL — ABNORMAL HIGH (ref 65–99)
Glucose-Capillary: 139 mg/dL — ABNORMAL HIGH (ref 65–99)

## 2015-04-25 LAB — URINE CULTURE
CULTURE: NO GROWTH
Special Requests: NORMAL

## 2015-04-25 LAB — AMMONIA: Ammonia: 51 umol/L — ABNORMAL HIGH (ref 9–35)

## 2015-04-25 MED ORDER — PANTOPRAZOLE SODIUM 40 MG PO TBEC
40.0000 mg | DELAYED_RELEASE_TABLET | Freq: Two times a day (BID) | ORAL | Status: DC
  Administered 2015-04-25 – 2015-04-30 (×11): 40 mg via ORAL
  Filled 2015-04-25 (×11): qty 1

## 2015-04-25 MED ORDER — INSULIN ASPART 100 UNIT/ML ~~LOC~~ SOLN
0.0000 [IU] | Freq: Three times a day (TID) | SUBCUTANEOUS | Status: DC
  Administered 2015-04-25: 1 [IU] via SUBCUTANEOUS
  Administered 2015-04-25 – 2015-04-26 (×3): 2 [IU] via SUBCUTANEOUS
  Administered 2015-04-26: 1 [IU] via SUBCUTANEOUS
  Administered 2015-04-26 – 2015-04-27 (×5): 2 [IU] via SUBCUTANEOUS
  Administered 2015-04-28: 1 [IU] via SUBCUTANEOUS
  Administered 2015-04-28: 3 [IU] via SUBCUTANEOUS
  Administered 2015-04-28: 2 [IU] via SUBCUTANEOUS
  Administered 2015-04-28: 3 [IU] via SUBCUTANEOUS
  Administered 2015-04-29 – 2015-04-30 (×5): 2 [IU] via SUBCUTANEOUS
  Administered 2015-04-30: 1 [IU] via SUBCUTANEOUS

## 2015-04-25 NOTE — Progress Notes (Signed)
PHARMACIST - PHYSICIAN COMMUNICATION DR:   Blake DivineAkula CONCERNING: IV to Oral Route Change Policy  RECOMMENDATION: This patient is receiving Protonix by the intravenous route.  Based on criteria approved by the Pharmacy and Therapeutics Committee, the intravenous medication(s) is/are being converted to the equivalent oral dose form(s).   DESCRIPTION: These criteria include:  The patient is eating (either orally or via tube) and/or has been taking other orally administered medications for a least 24 hours  The patient has no evidence of active gastrointestinal bleeding or impaired GI absorption (gastrectomy, short bowel, patient on TNA or NPO).  If you have questions about this conversion, please contact the Pharmacy Department  []   [phone]( [phone] )  [facility]   Curtis Estrada PharmD, [place] Pager [phone] 04/25/2015 10:49 AM

## 2015-04-25 NOTE — Progress Notes (Signed)
I came by to visit with [name] today but he says he is tired and not up to talking today. He has no complaints of discomfort. He asked me to come back tomorrow. I will follow up 10/13.   Yong ChannelAlicia Kaleea Penner, NP Palliative Medicine Team Pager # [phone] (M-F 8a-5p) Team Phone # [phone] (Nights/Weekends)

## 2015-04-25 NOTE — Progress Notes (Signed)
[facility] PROGRESS NOTE  Curtis Estrada RUE:[id number] DOB: [birth year] DOA: 04/23/2015 PCP: [facility] Interim summary: 59 y/o male with PMH of HTN, DM, Alcoholism, Hep C, Liver cirrhosis complicated with ascites, recurrent paracentesis, encephalopathy recently admitted (10/3-10/8), non compliance is readmitted with confusion, hepatic encephalopathy  Assessment/Plan: 1. Acute encephalopathy. Hepatic encephalopathy with advanced liver cirrhosis. Ammonia is 98 on admission. Neuro exam is no focal.  -restarted lactulose, rifaximin. Mental status is improving, patient appears intravascular volume depleted cont gentle IVF for 24 hrs.  2. Advanced liver cirrhosis, recurrent ascitis. Hypoalbuminemia, coagulopathy. Alcoholism. Hep C. - underwent US  Guided paracentesis and removed 7.4 lit of fluid.   -patient is intravascular volume depleted on admission.plan to resume lasix and spironolactone in am.   3. AKI. Cont gentle IVF.  Resolved with fluids.  4. DM.  CBG (last 3)   Recent Labs  04/25/15 0015 04/25/15 0737 04/25/15 1717  GLUCAP 178* 185* 162*    SSI.   Code Status: full code.  Family Communication: none at bedside. Disposition Plan: possible d/c in one day.    Consultants:  Palliative care.   Procedures:  US guided paracentesis.   Antibiotics:  IV rocephin.   HPI/Subjective: Feels much better.   Objective: Filed Vitals:   04/25/15 0600  BP: 92/65  Pulse: 97  Temp:   Resp: 18    Intake/Output Summary (Last 24 hours) at 04/25/15 0942 Last data filed at 04/25/15 0600  Gross per 24 hour  Intake 1903.75 ml  Output   2025 ml  Net -121.25 ml   Filed Weights   04/23/15 2208 04/24/15 0720 04/25/15 0400  Weight: 82.4 kg (181 lb 10.5 oz) 85.7 kg (188 lb 15 oz) 78.6 kg (173 lb 4.5 oz)    Exam:   General:  Alert afebrile comfortable  Cardiovascular: s1s2  Respiratory: diminished at bases, no wheezing or rhonchi  Abdomen: soft distended  no tenderness.   Musculoskeletal: no pedal edema.   Data Reviewed: Basic Metabolic Panel:  Recent Labs Lab 04/24/15 0100 04/24/15 0532 04/24/15 0855 04/24/15 1240 04/25/15 0345  NA 131* 132* 131* 132* 133*  K 4.4 4.1 4.1 4.0 4.1  CL 97* 97* 98* 99* 102  CO2 GLUCOSE 193* 169* 179* 260* 162*  BUN CREATININE 1.68* 1.58* 1.54* 1.49* 1.08  CALCIUM 8.7* 8.9 8.8* 8.8* 8.5*   Liver Function Tests:  Recent Labs Lab 04/19/15 0634 04/23/15 1736 04/23/15 2111  AST 50* 77* 69*  ALT 24 32 35  ALKPHOS 112 103 108  BILITOT 1.3* 1.3* 1.6*  PROT 5.4* 6.7 6.4*  ALBUMIN 1.7* 2.2* 2.2*   No results for input(s): LIPASE, AMYLASE in the last 168 hours.  Recent Labs Lab 04/23/15 2111 04/25/15 0345  AMMONIA 98* 51*   CBC:  Recent Labs Lab 04/19/15 0634 04/20/15 0513 04/23/15 2111 04/24/15 0100 04/25/15 0345  WBC 7.4 7.7 11.1* 9.8 8.8  NEUTROABS  --   --  6.4 5.6  --   HGB 13.4 13.0 14.9 13.4 12.9*  HCT 40.7 38.6* 44.7 40.6 38.1*  MCV 68.8* 69.2* 69.4* 69.0* 69.0*  PLT 266 291 321 291 251   Cardiac Enzymes: No results for input(s): CKTOTAL, CKMB, CKMBINDEX, TROPONINI in the last 168 hours. BNP (last 3 results)  Recent Labs  02/12/15 0907 04/16/15 2101 04/23/15 1943  BNP 19.0 16.3 25.7    ProBNP (last 3 results) No results for input(s): PROBNP in the last 8760  hours.  CBG:  Recent Labs Lab 04/24/15 0711 04/24/15 1155 04/24/15 1532 04/25/15 0015 04/25/15 0737  GLUCAP 156* 204* 238* 178* 185*    Recent Results (from the past 240 hour(s))  Culture, blood (routine x 2)     Status: None   Collection Time: 04/16/15  9:01 PM  Result Value Ref Range Status   Specimen Description BLOOD RIGHT ANTECUBITAL  Final   Special Requests BOTTLES DRAWN AEROBIC AND ANAEROBIC 10CC   Final   Culture NO GROWTH 5 DAYS  Final   Report Status 04/21/2015 FINAL  Final  Culture, blood (routine x 2)     Status: None   Collection Time: 04/16/15   9:09 PM  Result Value Ref Range Status   Specimen Description BLOOD RIGHT HAND  Final   Special Requests IN PEDIATRIC BOTTLE 1.5CC  Final   Culture NO GROWTH 5 DAYS  Final   Report Status 04/21/2015 FINAL  Final  Culture, body fluid-bottle     Status: None   Collection Time: 04/17/15 10:53 AM  Result Value Ref Range Status   Specimen Description FLUID PERITONEAL  Final   Special Requests BOTTLES DRAWN AEROBIC AND ANAEROBIC 10CC  Final   Gram Stain   Final    GRAM POSITIVE COCCI IN CLUSTERS AEROBIC BOTTLE ONLY CRITICAL RESULT CALLED TO, READ BACK BY AND VERIFIED WITH: K CAMPBELL RN 1900 04/18/15 A BROWNING    Culture   Final    MICROCOCCUS SPECIES Standardized susceptibility testing for this organism is not available.    Report Status 04/23/2015 FINAL  Final  Gram stain     Status: None   Collection Time: 04/17/15 10:53 AM  Result Value Ref Range Status   Specimen Description FLUID PERITONEAL  Final   Special Requests NONE  Final   Gram Stain   Final    RARE WBC PRESENT, PREDOMINANTLY MONONUCLEAR NO ORGANISMS SEEN GRAM STAIN REVIEWED-AGREE WITH RESULT SYARBOROUGH    Report Status 04/17/2015 FINAL  Final  Culture, body fluid-bottle     Status: None   Collection Time: 04/19/15 11:22 AM  Result Value Ref Range Status   Specimen Description FLUID ABDOMEN PERITONEAL  Final   Special Requests NONE  Final   Culture NO GROWTH 5 DAYS  Final   Report Status 04/24/2015 FINAL  Final  Gram stain     Status: None   Collection Time: 04/19/15 11:22 AM  Result Value Ref Range Status   Specimen Description FLUID ABDOMEN PERITONEAL  Final   Special Requests NONE  Final   Gram Stain   Final    MODERATE WBC PRESENT, PREDOMINANTLY MONONUCLEAR NO ORGANISMS SEEN    Report Status 04/19/2015 FINAL  Final  Culture, blood (routine x 2)     Status: None (Preliminary result)   Collection Time: 04/23/15  7:30 PM  Result Value Ref Range Status   Specimen Description BLOOD LEFT ANTECUBITAL  Final    Special Requests BOTTLES DRAWN AEROBIC AND ANAEROBIC 5 CC   Final   Culture   Final    NO GROWTH < 24 HOURS Performed at [facility]    Report Status PENDING  Incomplete  Culture, blood (routine x 2)     Status: None (Preliminary result)   Collection Time: 04/23/15  7:40 PM  Result Value Ref Range Status   Specimen Description BLOOD RIGHT ARM  Final   Special Requests BOTTLES DRAWN AEROBIC ONLY 6 CC  Final   Culture   Final    NO GROWTH <  24 HOURS Performed at [facility]    Report Status PENDING  Incomplete  Urine culture     Status: None   Collection Time: 04/23/15  9:01 PM  Result Value Ref Range Status   Specimen Description URINE, CATHETERIZED  Final   Special Requests Normal  Final   Culture   Final    NO GROWTH 1 DAY Performed at [facility]    Report Status 04/25/2015 FINAL  Final  MRSA PCR Screening     Status: None   Collection Time: 04/23/15 10:23 PM  Result Value Ref Range Status   MRSA by PCR NEGATIVE NEGATIVE Final    Comment:        The GeneXpert MRSA Assay (FDA approved for NASAL specimens only), is one component of a comprehensive MRSA colonization surveillance program. It is not intended to diagnose MRSA infection nor to guide or monitor treatment for MRSA infections.   Gram stain     Status: None   Collection Time: 04/24/15  2:23 PM  Result Value Ref Range Status   Specimen Description FLUID ASCITIC  Final   Special Requests NONE  Final   Gram Stain   Final    FEW WBC PRESENT, PREDOMINANTLY MONONUCLEAR NO ORGANISMS SEEN    Report Status 04/24/2015 FINAL  Final     Studies: US Paracentesis  04/24/2015  INDICATION: Hepatitis-C, cirrhosis, recurrent ascites. Request is made for diagnostic and therapeutic paracentesis. EXAM: ULTRASOUND-GUIDED DIAGNOSTIC AND THERAPEUTIC PARACENTESIS COMPARISON:  Prior paracentesis on 04/19/2015 MEDICATIONS: None. COMPLICATIONS: None immediate TECHNIQUE: Informed written consent was obtained  from the patient after a discussion of the risks, benefits and alternatives to treatment. A timeout was performed prior to the initiation of the procedure. Initial ultrasound scanning demonstrates a large amount of ascites within the right lower abdominal quadrant. The right lower abdomen was prepped and draped in the usual sterile fashion. 1% lidocaine was used for local anesthesia. Under direct ultrasound guidance, a 19 gauge, 10-cm, Yueh catheter was introduced. An ultrasound image was saved for documentation purposed. The paracentesis was performed. The catheter was removed and a dressing was applied. The patient tolerated the procedure well without immediate post procedural complication. FINDINGS: A total of approximately 7.4 liters of turbid, amber fluid was removed. Samples were sent to the laboratory as requested by the clinical team. IMPRESSION: Successful ultrasound-guided diagnostic and therapeutic paracentesis yielding 7.4 liters of peritoneal fluid. Read by: Jeananne Rama, PA-C Electronically Signed   By: Simonne Come M.D.   On: 04/24/2015 15:14   Dg Chest Port 1 View  04/23/2015  CLINICAL DATA:  Acute confusion and pain. EXAM: PORTABLE CHEST 1 VIEW COMPARISON:  04/18/2015 and prior radiographs FINDINGS: This is a low volume film. Upper limits normal heart size noted. Pulmonary vascular congestion and mild interstitial opacities are identified- likely mild edema. There is no evidence of pneumothorax or definite pleural effusion. No acute bony abnormalities are identified. IMPRESSION: Pulmonary vascular congestion and question mild interstitial edema. Electronically Signed   By: Harmon Pier M.D.   On: 04/23/2015 19:34   Dg Abd Portable 1v  04/24/2015  CLINICAL DATA:  Severe abdominal pain and distension intermittently for the past 46 months, history of hepatic cirrhosis and ascites, un controlled diabetes, hepatitis-C. EXAM: PORTABLE ABDOMEN - 1 VIEW COMPARISON:  Abdominal CT scan of February 05, 2015  FINDINGS: There is increased stool burden throughout the right colon and transverse colon. There is no small or large bowel obstructive pattern. In the right upper  quadrant adjacent to the medial aspect of the right eleventh rib there is a concentric ring contour that could reflect intussusception of small bowel en face, but again no obstructive pattern is observed. The bony structures are unremarkable. There are no abnormal soft tissue calcifications. IMPRESSION: There is no bowel obstruction or ileus. Increased right colonic and transverse colonic stool burden. This may reflect constipation in the appropriate clinical setting. Abdominal and pelvic CT scanning is recommended if the patient's clinical exam warrants further evaluation. Electronically Signed   By: David  SwazilandJordan M.D.   On: 04/24/2015 09:50    Scheduled Meds: . antiseptic oral rinse  7 mL Mouth Rinse BID  . cefTRIAXone (ROCEPHIN)  IV  2 g Intravenous Q24H  . folic acid  1 mg Oral Daily  . insulin aspart  0-9 Units Subcutaneous TID WC  . lactulose  30 g Oral TID  . pantoprazole (PROTONIX) IV  40 mg Intravenous Q12H  . propranolol  10 mg Oral BID  . rifaximin  550 mg Oral BID  . sodium chloride  3 mL Intravenous Q12H   Continuous Infusions: . sodium chloride 75 mL/hr at 04/25/15 0600    Principal Problem:   Hepatic encephalopathy (HCC) Active Problems:   Hepatitis C   Ascites   Essential hypertension, benign   Uncontrolled type 2 diabetes mellitus (HCC)   Decompensated hepatic cirrhosis (HCC)   AKI (acute kidney injury) (HCC)   GERD without esophagitis   Lactic acidosis   Hyponatremia    Time spent: 25 minutes    Curtis Estrada  [facility] Pager [phone] If 7PM-7AM, please contact night-coverage at [email or web address], password [email or web address] 04/25/2015, 9:42 AM  LOS: 2 days

## 2015-04-26 DIAGNOSIS — R188 Other ascites: Secondary | ICD-10-CM

## 2015-04-26 DIAGNOSIS — Z515 Encounter for palliative care: Secondary | ICD-10-CM

## 2015-04-26 LAB — BASIC METABOLIC PANEL
ANION GAP: 5 (ref 5–15)
BUN: 9 mg/dL (ref 6–20)
CALCIUM: 8.3 mg/dL — AB (ref 8.9–10.3)
CO2: 25 mmol/L (ref 22–32)
CREATININE: 0.85 mg/dL (ref 0.61–1.24)
Chloride: 105 mmol/L (ref 101–111)
Glucose, Bld: 176 mg/dL — ABNORMAL HIGH (ref 65–99)
Potassium: 3.8 mmol/L (ref 3.5–5.1)
SODIUM: 135 mmol/L (ref 135–145)

## 2015-04-26 LAB — GLUCOSE, CAPILLARY
GLUCOSE-CAPILLARY: 173 mg/dL — AB (ref 65–99)
GLUCOSE-CAPILLARY: 159 mg/dL — AB (ref 65–99)
GLUCOSE-CAPILLARY: 130 mg/dL — AB (ref 65–99)
GLUCOSE-CAPILLARY: 161 mg/dL — AB (ref 65–99)

## 2015-04-26 MED ORDER — FUROSEMIDE 40 MG PO TABS
80.0000 mg | ORAL_TABLET | Freq: Two times a day (BID) | ORAL | Status: DC
  Administered 2015-04-26 – 2015-04-28 (×4): 80 mg via ORAL
  Filled 2015-04-26 (×4): qty 2

## 2015-04-26 MED ORDER — TRAMADOL HCL 50 MG PO TABS
100.0000 mg | ORAL_TABLET | Freq: Once | ORAL | Status: AC
  Administered 2015-04-26: 100 mg via ORAL
  Filled 2015-04-26: qty 2

## 2015-04-26 MED ORDER — SPIRONOLACTONE 25 MG PO TABS
25.0000 mg | ORAL_TABLET | Freq: Every day | ORAL | Status: DC
  Administered 2015-04-26 – 2015-04-30 (×5): 25 mg via ORAL
  Filled 2015-04-26 (×5): qty 1

## 2015-04-26 NOTE — Progress Notes (Addendum)
[facility] PROGRESS NOTE  Curtis Estrada ZOX:[id number] DOB: Jun 29, 1956 DOA: 04/23/2015 PCP: [facility] Interim summary: 59 y/o male with PMH of HTN, DM, Alcoholism, Hep C, Liver cirrhosis complicated with ascites, recurrent paracentesis, encephalopathy recently admitted (10/3-10/8), non compliance is readmitted with confusion, hepatic encephalopathy  Assessment/Plan: 1. Acute encephalopathy. Hepatic encephalopathy with advanced liver cirrhosis. Ammonia is 98 on admission. Neuro exam is no focal. ammonia level improved.  -restarted lactulose, rifaximin. Mental status is improved.   2. Advanced liver cirrhosis, recurrent ascitis. Hypoalbuminemia, coagulopathy. Alcoholism. Hep C. - underwent US  Guided paracentesis and removed 7.4 lit of fluid.   - ordered another US paracentesis for tomorrow as his abdomen is more distended.  -. resume lasix and spironolactone and monitor renal function. .  - his fluid cultures have been negative so far, plan to stop the rocephin, if they are negative.   3. AKI. Cont gentle IVF.  Resolved with fluids. Stop IV fluids and  Plan to resume lasix and spironolactone.  4. DM.  CBG (last 3)   Recent Labs  04/25/15 2218 04/26/15 0753 04/26/15 1200  GLUCAP 139* 173* 159*    SSI.   Code Status: full code.  Family Communication: none at bedside. Disposition Plan: pending palliative care consult, will get PT eval.    Consultants:  Palliative care.   Procedures:  US guided paracentesis.   Antibiotics:  IV rocephin.   HPI/Subjective: Reports his abdomen is getting big, and he feels weak.   Objective: Filed Vitals:   04/26/15 1350  BP: 106/69  Pulse: 90  Temp: 98.2 F (36.8 C)  Resp: 18    Intake/Output Summary (Last 24 hours) at 04/26/15 1616 Last data filed at 04/26/15 1400  Gross per 24 hour  Intake   1898 ml  Output      0 ml  Net   1898 ml   Filed Weights   04/24/15 0720 04/25/15 0400 04/26/15 0545  Weight:  85.7 kg (188 lb 15 oz) 78.6 kg (173 lb 4.5 oz) 78.7 kg (173 lb 8 oz)    Exam:   General:  Alert afebrile comfortable  Cardiovascular: s1s2  Respiratory: diminished at bases, no wheezing or rhonchi  Abdomen: soft distended no tenderness.   Musculoskeletal: no pedal edema.   Data Reviewed: Basic Metabolic Panel:  Recent Labs Lab 04/24/15 0532 04/24/15 0855 04/24/15 1240 04/25/15 0345 04/26/15 1125  NA 132* 131* 132* 133* 135  K 4.1 4.1 4.0 4.1 3.8  CL 97* 98* 99* 102 105  CO2 GLUCOSE 169* 179* 260* 162* 176*  BUN CREATININE 1.58* 1.54* 1.49* 1.08 0.85  CALCIUM 8.9 8.8* 8.8* 8.5* 8.3*   Liver Function Tests:  Recent Labs Lab 04/23/15 1736 04/23/15 2111  AST 77* 69*  ALT 32 35  ALKPHOS 103 108  BILITOT 1.3* 1.6*  PROT 6.7 6.4*  ALBUMIN 2.2* 2.2*   No results for input(s): LIPASE, AMYLASE in the last 168 hours.  Recent Labs Lab 04/23/15 2111 04/25/15 0345  AMMONIA 98* 51*   CBC:  Recent Labs Lab 04/20/15 0513 04/23/15 2111 04/24/15 0100 04/25/15 0345  WBC 7.7 11.1* 9.8 8.8  NEUTROABS  --  6.4 5.6  --   HGB 13.0 14.9 13.4 12.9*  HCT 38.6* 44.7 40.6 38.1*  MCV 69.2* 69.4* 69.0* 69.0*  PLT 291 321 291 251   Cardiac Enzymes: No results for input(s): CKTOTAL, CKMB, CKMBINDEX, TROPONINI in the last 168 hours.  BNP (last 3 results)  Recent Labs  02/12/15 0907 04/16/15 2101 04/23/15 1943  BNP 19.0 16.3 25.7    ProBNP (last 3 results) No results for input(s): PROBNP in the last 8760 hours.  CBG:  Recent Labs Lab 04/25/15 1156 04/25/15 1717 04/25/15 2218 04/26/15 0753 04/26/15 1200  GLUCAP 210* 162* 139* 173* 159*    Recent Results (from the past 240 hour(s))  Culture, blood (routine x 2)     Status: None   Collection Time: 04/16/15  9:01 PM  Result Value Ref Range Status   Specimen Description BLOOD RIGHT ANTECUBITAL  Final   Special Requests BOTTLES DRAWN AEROBIC AND ANAEROBIC 10CC   Final    Culture NO GROWTH 5 DAYS  Final   Report Status 04/21/2015 FINAL  Final  Culture, blood (routine x 2)     Status: None   Collection Time: 04/16/15  9:09 PM  Result Value Ref Range Status   Specimen Description BLOOD RIGHT HAND  Final   Special Requests IN PEDIATRIC BOTTLE 1.5CC  Final   Culture NO GROWTH 5 DAYS  Final   Report Status 04/21/2015 FINAL  Final  Culture, body fluid-bottle     Status: None   Collection Time: 04/17/15 10:53 AM  Result Value Ref Range Status   Specimen Description FLUID PERITONEAL  Final   Special Requests BOTTLES DRAWN AEROBIC AND ANAEROBIC 10CC  Final   Gram Stain   Final    GRAM POSITIVE COCCI IN CLUSTERS AEROBIC BOTTLE ONLY CRITICAL RESULT CALLED TO, READ BACK BY AND VERIFIED WITH: K CAMPBELL RN 1900 04/18/15 A BROWNING    Culture   Final    MICROCOCCUS SPECIES Standardized susceptibility testing for this organism is not available.    Report Status 04/23/2015 FINAL  Final  Gram stain     Status: None   Collection Time: 04/17/15 10:53 AM  Result Value Ref Range Status   Specimen Description FLUID PERITONEAL  Final   Special Requests NONE  Final   Gram Stain   Final    RARE WBC PRESENT, PREDOMINANTLY MONONUCLEAR NO ORGANISMS SEEN GRAM STAIN REVIEWED-AGREE WITH RESULT SYARBOROUGH    Report Status 04/17/2015 FINAL  Final  Culture, body fluid-bottle     Status: None   Collection Time: 04/19/15 11:22 AM  Result Value Ref Range Status   Specimen Description FLUID ABDOMEN PERITONEAL  Final   Special Requests NONE  Final   Culture NO GROWTH 5 DAYS  Final   Report Status 04/24/2015 FINAL  Final  Gram stain     Status: None   Collection Time: 04/19/15 11:22 AM  Result Value Ref Range Status   Specimen Description FLUID ABDOMEN PERITONEAL  Final   Special Requests NONE  Final   Gram Stain   Final    MODERATE WBC PRESENT, PREDOMINANTLY MONONUCLEAR NO ORGANISMS SEEN    Report Status 04/19/2015 FINAL  Final  Culture, blood (routine x 2)     Status:  None (Preliminary result)   Collection Time: 04/23/15  7:30 PM  Result Value Ref Range Status   Specimen Description BLOOD LEFT ANTECUBITAL  Final   Special Requests BOTTLES DRAWN AEROBIC AND ANAEROBIC 5 CC   Final   Culture   Final    NO GROWTH 3 DAYS Performed at [facility]    Report Status PENDING  Incomplete  Culture, blood (routine x 2)     Status: None (Preliminary result)   Collection Time: 04/23/15  7:40 PM  Result Value Ref  Range Status   Specimen Description BLOOD RIGHT ARM  Final   Special Requests BOTTLES DRAWN AEROBIC ONLY 6 CC  Final   Culture   Final    NO GROWTH 3 DAYS [facility]    Report Status PENDING  Incomplete  Culture, body fluid-bottle     Status: None (Preliminary result)   Collection Time: 04/23/15  8:15 PM  Result Value Ref Range Status   Specimen Description FLUID  Final   Special Requests NONE  Final   Culture   Final    NO GROWTH 2 DAYS [facility]    Report Status PENDING  Incomplete  Urine culture     Status: None   Collection Time: 04/23/15  9:01 PM  Result Value Ref Range Status   Specimen Description URINE, CATHETERIZED  Final   Special Requests Normal  Final   Culture   Final    NO GROWTH 1 DAY [facility]formed [facility]    Report Status 04/25/2015 FINAL  Final  MRSA PCR Screening     Status: None   Collection Time: 04/23/15 10:23 PM  Result Value Ref Range Status   MRSA by PCR NEGATIVE NEGATIVE Final    Comment:        The GeneXpert MRSA Assay (FDA approved for NASAL specimens only), is one component of a comprehensive MRSA colonization surveillance program. It is not intended to diagnose MRSA infection nor to guide or monitor treatment for MRSA infections.   Culture, body fluid-bottle     Status: None (Preliminary result)   Collection Time: 04/24/15  2:23 PM  Result Value Ref Range Status   Specimen Description FLUID ASCITIC  Final   Special Requests NONE  Final    Culture NO GROWTH 2 DAYS  Final   Report Status PENDING  Incomplete  Gram stain     Status: None   Collection Time: 04/24/15  2:23 PM  Result Value Ref Range Status   Specimen Description FLUID ASCITIC  Final   Special Requests NONE  Final   Gram Stain   Final    FEW WBC PRESENT, PREDOMINANTLY MONONUCLEAR NO ORGANISMS SEEN    Report Status 04/24/2015 FINAL  Final     Studies: No results found.  Scheduled Meds: . antiseptic oral rinse  7 mL Mouth Rinse BID  . cefTRIAXone (ROCEPHIN)  IV  2 g Intravenous Q24H  . folic acid  1 mg Oral Daily  . insulin aspart  0-9 Units Subcutaneous TID AC & HS  . lactulose  30 g Oral TID  . pantoprazole  40 mg Oral BID  . propranolol  10 mg Oral BID  . rifaximin  550 mg Oral BID  . sodium chloride  3 mL Intravenous Q12H   Continuous Infusions: . sodium chloride 75 mL/hr at 04/25/15 2229    Principal Problem:   Hepatic encephalopathy (HCC) Active Problems:   Hepatitis C   Ascites   Essential hypertension, benign   Uncontrolled type 2 diabetes mellitus (HCC)   Decompensated hepatic cirrhosis (HCC)   AKI (acute kidney injury) (HCC)   GERD without esophagitis   Lactic acidosis   Hyponatremia    Time spent: 25 minutes    Zanna Hawn  [facility] Pager [phone] 7PM-7AM, please contact night-coverage at [email or web address], password [email or web address] 04/26/2015, 4:16 PM  LOS: 3 days

## 2015-04-26 NOTE — Consult Note (Signed)
Consultation Note Date: 04/26/2015   Patient Name: Curtis Estrada  DOB: 1955-11-20  MRN: [id number]  Age / Sex: 59 y.o., male   PCP: [facility] Referring Physician: Hosie Poisson, MD  Reason for Consultation: Green Assessment and Plan Summary of Established Goals of Care and Medical Treatment Preferences    Palliative Care Discussion Held Today:   I met with Curtis Estrada today who is more awake and willing to talk. He complains about his stomach and diarrhea (he says he had 2 BM already this morning and he usually has 2-3 BM per day). He also complains and flinches to neuropathy in his feet - he says the gabapentin works but he has to take extra ("4-5 pills to sleep at night").   We turned the discussion to his cirrhosis and his frequent hospitalizations. He tells me "my liver is shot." He says that [facility] and his doctor says that his "liver is not going to get better."  This is why he stopped drinking ~4 months ago and is trying to stop smoking. He knows this will kill him but he is not ready to die. He says he is scared to die before his mother and sisters. He wants treatment continued with hopes of maintaining as long as possible. He says that his QOL is "good I guess." He says on good days he can get out of his house and use bus to get around. Sisters help with groceries. Appetite is poor and he is interested in trying some Glucerna.   We discussed resuscitation and what this means with organ failure. He tells me that he "wants to try" but would not want to be left on life support if he isn't getting better. He says he completed paperwork at the [place] a few months ago naming sister Curtis Estrada and Curtis Estrada as [place].    Contacts/Participants in Discussion: Primary Decision Maker: Self  Goals of Care/Code Status/Advance Care Planning:   Code Status: FULL   Symptom Management:   Poor appetite: Recommend nutrition support with  Glucerna.   Ascites: Paracentesis as needed.   Encephalopathy: Continue lactulose to optimize.   Psycho-social/Spiritual:   Support System: He says his sisters are very supportive.   Chaplain: Following for support. He is concerned about making it to his nephew's funeral who was shot and killed a couple days ago. He is grieving.   Prognosis: < 6 months  Discharge Planning:  He requests SNF rehab       Chief Complaint/HPI: 59 yo male with h/o alcohol abuse, cirrhosis with ascites, Hep C, recurrent paracentesis, encephalopathy. Admitted with hepatic encephalopathy and ammonia 98.   Primary Diagnoses  Present on Admission:  . Hepatic encephalopathy ([place]) . Hepatitis C . Ascites . Essential hypertension, benign . Uncontrolled type 2 diabetes mellitus ([place]) . Decompensated hepatic cirrhosis ([place]) . AKI (acute kidney injury) ([place]) . GERD without esophagitis . Lactic acidosis . Hyponatremia  Palliative Review of Systems:   + diarrhea (as expected s/t lactulose)   I have reviewed the medical record, interviewed the patient and family, and examined the patient. The following aspects are pertinent.  Past Medical History  Diagnosis Date  . Hypertension   . Cirrhosis ([place]) 01/2013    hep C and alcoholic  . ETOH abuse   . Ascites 01/2013  . Hematoma 01/2013    posterior right flank from a fall  . Coagulopathy ([place]) 01/2013    secondary to  liver disease.   . Anemia 02/2013  . Hyponatremia 01/2013  . Unspecified constipation 04/04/2013  . H pylori ulcer 03/05/2013  . Gastric ulcer with hemorrhage 03/03/2013  . Chronic wound of extremity-right great toe 03/03/2013  . Acute blood loss anemia 03/03/2013  . Spontaneous bacterial peritonitis ([place]) 09/20/2014  . Hyperlipidemia   . Pneumonia ? 09/2014; 02/2015  . Chronic bronchitis ([place])   . GERD (gastroesophageal reflux disease)   . Daily headache   . Arthritis     "shoulders" (03/21/2015)  . Type II diabetes mellitus (HCC)      uncontrolled  . Hepatitis C    Social History   Social History  . Marital Status: Divorced    Spouse Name: N/A  . Number of Children: N/A  . Years of Education: N/A   Social History Main Topics  . Smoking status: Current Every Day Smoker -- 0.10 packs/day for 47 years    Types: Cigarettes  . Smokeless tobacco: Never Used  . Alcohol Use: Yes     Comment: 03/21/2015 "last drink was 02/15/2015)  . Drug Use: No  . Sexual Activity: Yes   Other Topics Concern  . None   Social History Narrative   Family History  Problem Relation Age of Onset  . Dementia Mother   . Cancer - Other Father    Scheduled Meds: . antiseptic oral rinse  7 mL Mouth Rinse BID  . cefTRIAXone (ROCEPHIN)  IV  2 g Intravenous Q24H  . folic acid  1 mg Oral Daily  . insulin aspart  0-9 Units Subcutaneous TID AC & HS  . lactulose  30 g Oral TID  . pantoprazole  40 mg Oral BID  . propranolol  10 mg Oral BID  . rifaximin  550 mg Oral BID  . sodium chloride  3 mL Intravenous Q12H   Continuous Infusions: . sodium chloride 75 mL/hr at 04/25/15 2229   PRN Meds:.acetaminophen, bisacodyl, ondansetron **OR** ondansetron (ZOFRAN) IV Medications Prior to Admission:  Prior to Admission medications   Medication Sig Start Date End Date Taking? Authorizing Provider  albuterol (PROVENTIL HFA;VENTOLIN HFA) 108 (90 BASE) MCG/ACT inhaler Inhale 2 puffs into the lungs every 2 (two) hours as needed for wheezing or shortness of breath (or coughing). 09/40/76  Yes Delora Fuel, MD  folic acid (FOLVITE) 1 MG tablet Take 1 tablet (1 mg total) by mouth daily. 09/21/14  Yes Venetia Maxon Rama, MD  furosemide (LASIX) 80 MG tablet Take 1 tablet (80 mg total) by mouth 2 (two) times daily. 02/19/15  Yes Orson Eva, MD  gabapentin (NEURONTIN) 300 MG capsule Take 600 mg by mouth 3 (three) times daily.   Yes Historical Provider, MD  insulin aspart (NOVOLOG) 100 UNIT/ML injection Inject 0-6 Units into the skin 3 (three) times daily with meals. 0-6  Units, Subcutaneous, 3 times daily with meals  CBG < 70: implement hypoglycemia protocol; CBG 70 - 120: 0 units; CBG 121 - 150: 1 unit; CBG 151 - 200: 2 units; CBG 201 - 250: 3 units; CBG 251 - 300: 5 units; CBG 301 - 350: 7 units; CBG 351 - 400: 9 units; CBG > 400: call MD 03/10/13  Yes Shanker Kristeen Mans, MD  insulin detemir (LEVEMIR) 100 UNIT/ML injection Inject 0.18 mLs (18 Units total) into the skin at bedtime. 02/19/15  Yes Orson Eva, MD  lactulose (CHRONULAC) 10 GM/15ML solution Take 15 mLs (10 g total) by mouth 2 (two) times daily. 03/24/15  Yes Cristal Ford, DO  Magnesium Oxide 420 MG TABS Take 1 tablet by mouth daily.   Yes Historical Provider, MD  metFORMIN (GLUCOPHAGE) 1000 MG tablet Take 1,000 mg by mouth 2 (two) times daily.   Yes Historical Provider, MD  Multiple Vitamin (MULTIVITAMIN WITH MINERALS) TABS tablet Take 1 tablet by mouth daily. 09/21/14  Yes Christina P Rama, MD  pantoprazole (PROTONIX) 40 MG tablet Take 40 mg by mouth 2 (two) times daily.    Yes Historical Provider, MD  polyvinyl alcohol (LIQUIFILM TEARS) 1.4 % ophthalmic solution Place 1 drop into both eyes daily as needed (dry eyes).    Yes Historical Provider, MD  propranolol (INDERAL) 10 MG tablet Take 1 tablet (10 mg total) by mouth 2 (two) times daily. 02/19/15  Yes Orson Eva, MD  rifaximin (XIFAXAN) 550 MG TABS tablet Take 1 tablet (550 mg total) by mouth 2 (two) times daily. 03/24/15  Yes Maryann Mikhail, DO  spironolactone (ALDACTONE) 25 MG tablet Take 25 mg by mouth daily.   Yes Historical Provider, MD  benzonatate (TESSALON) 200 MG capsule Take 1 capsule (200 mg total) by mouth 2 (two) times daily as needed for cough. 04/21/15   Maryann Mikhail, DO   Allergies  Allergen Reactions  . Penicillins     Childhood Has received Rocephin w/o reaction   CBC:    Component Value Date/Time   WBC 8.8 04/25/2015 0345   HGB 12.9* 04/25/2015 0345   HCT 38.1* 04/25/2015 0345   PLT 251 04/25/2015 0345   MCV 69.0* 04/25/2015  0345   NEUTROABS 5.6 04/24/2015 0100   LYMPHSABS 2.2 04/24/2015 0100   MONOABS 1.7* 04/24/2015 0100   EOSABS 0.2 04/24/2015 0100   BASOSABS 0.1 04/24/2015 0100   Comprehensive Metabolic Panel:    Component Value Date/Time   NA 133* 04/25/2015 0345   K 4.1 04/25/2015 0345   CL 102 04/25/2015 0345   CO2 22 04/25/2015 0345   BUN 14 04/25/2015 0345   CREATININE 1.08 04/25/2015 0345   GLUCOSE 162* 04/25/2015 0345   CALCIUM 8.5* 04/25/2015 0345   AST 69* 04/23/2015 2111   ALT 35 04/23/2015 2111   ALKPHOS 108 04/23/2015 2111   BILITOT 1.6* 04/23/2015 2111   PROT 6.4* 04/23/2015 2111   ALBUMIN 2.2* 04/23/2015 2111    Physical Exam:  Vital Signs: BP 92/65 mmHg  Pulse 94  Temp(Src) 98.2 F (36.8 C) (Oral)  Resp 20  Ht _0  (1.702 m)  Wt 78.7 kg (173 lb 8 oz)  BMI 27.17 kg/m2  SpO2 99% SpO2: SpO2: 99 % O2 Device: O2 Device: Not Delivered O2 Flow Rate: O2 Flow Rate (L/min): 2 L/min Intake/output summary:  Intake/Output Summary (Last 24 hours) at 04/26/15 1057 Last data filed at 04/26/15 0914  Gross per 24 hour  Intake 451.75 ml  Output    500 ml  Net -48.25 ml   LBM: Last BM Date: 04/26/15 Baseline Weight: Weight: 82.4 kg (181 lb 10.5 oz) Most recent weight: Weight: 78.7 kg (173 lb 8 oz)  Exam Findings:   General: NAD, lying in bed HEENT: Temporal muscle wasting Resp: No labored breathing Abd: Distended - ascites Neuro: Awake, alert, oriented x 3           Palliative Performance Scale: 40 %                Additional Data Reviewed: Recent Labs     04/24/15  0100   04/24/15  1240  04/25/15  0345  WBC  9.8   --    --  8.8  HGB  13.4   --    --   12.9*  PLT  291   --    --   251  NA  131*   < >  132*  133*  BUN  19   < >  19  14  CREATININE  1.68*   < >  1.49*  1.08   < > = values in this interval not displayed.     Time In: 1010 Time Out: 1110 Time Total: 62mn  Greater than 50%  of this time was spent counseling and coordinating care related  to the above assessment and plan.   Signed by:  AVinie Sill NP Palliative Medicine Team Pager # [phone](M-F 8a-5p) Team Phone # [phone](Nights/Weekends)

## 2015-04-27 ENCOUNTER — Inpatient Hospital Stay (HOSPITAL_COMMUNITY): Payer: Medicaid Other

## 2015-04-27 LAB — GLUCOSE, CAPILLARY
GLUCOSE-CAPILLARY: 143 mg/dL — AB (ref 65–99)
Glucose-Capillary: 188 mg/dL — ABNORMAL HIGH (ref 65–99)
Glucose-Capillary: 174 mg/dL — ABNORMAL HIGH (ref 65–99)
Glucose-Capillary: 157 mg/dL — ABNORMAL HIGH (ref 65–99)

## 2015-04-27 NOTE — Progress Notes (Signed)
Daily Progress Note   Patient Name: Curtis Estrada       Date: 04/27/2015 DOB: 02/24/1956  Age: 59 y.o. MRN#: [id number] Attending Physician: Kathlen ModyVijaya Akula, MD [facility] Admit Date: 04/23/2015  Reason for Consultation/Follow-up: GOC  Subjective:     I spoke with [name] again and he is very pleasant and cooperative. I recommended to him SNF consideration to help manage his care. He is very resistant. I think he will have many more options to help manage him and keep him out of the hospital. He tells me that he will think about it. I also asked permission to call his sister Curtis Estrada and he permits.   I spoke with sister Curtis Estrada. Curtis Estrada tells me that they understand that his condition is terminal and getting worse. She says that he has not been drinking as he has told me but that he had an opportunity to stop drinking and consideration of liver transplant but would not at that time. She says that he cannot live alone. This was before I gave her any information or recommendations. I told her about our conversation and that I agree he needs SNF and then he could have a drain to manage his ascites and they will help make sure he takes his lactulose and medications as prescribed. Curtis Estrada wants to respect his wishes regarding code status at this time but understands the limitations.   Curtis Estrada says that she will get her family together tomorrow and talk to him about going to SNF. [facility] is [facility] how to [facility].    Length of Stay: 4 days  Current Medications: Scheduled Meds:  . antiseptic oral rinse  7 mL Mouth Rinse BID  . cefTRIAXone (ROCEPHIN)  IV  2 g Intravenous Q24H  . folic acid  1 mg Oral Daily  . furosemide  80 mg Oral BID  . insulin aspart  0-9 Units Subcutaneous TID AC & HS  . lactulose  30 g Oral TID  . pantoprazole  40 mg Oral BID  . propranolol  10 mg Oral BID  . rifaximin  550 mg Oral BID  . sodium chloride  3 mL  Intravenous Q12H  . spironolactone  25 mg Oral Daily    Continuous Infusions:    PRN Meds: acetaminophen, bisacodyl, ondansetron **OR** ondansetron (ZOFRAN) IV  Palliative Performance Scale: 40%    Vital Signs: BP 84/64 mmHg  Pulse 85  Temp(Src) 97.8 F (36.6 C) (Oral)  Resp 16  Ht 5\' 7"  (1.702 m)  Wt 79.153 kg (174 lb 8 oz)  BMI 27.32 kg/m2  SpO2 100% SpO2: SpO2: 100 % O2 Device: O2 Device: Not Delivered O2 Flow Rate: O2 Flow Rate (L/min): 2 L/min  Intake/output summary:  Intake/Output Summary (Last 24 hours) at 04/27/15 1528 Last data filed at 04/27/15 1033  Gross per 24 hour  Intake 474.25 ml  Output      0 ml  Net 474.25 ml   LBM: Last BM Date: 04/26/15 Baseline Weight: Weight: 82.4 kg (181 lb 10.5 oz) Most recent weight: Weight: 79.153 kg (174 lb 8 oz)  Physical Exam: General: NAD, lying in bed HEENT: Temporal muscle wasting Resp: No labored breathing Abd: Distended - ascites Neuro: Awake, alert, oriented x 3    Additional Data Reviewed: Recent Labs     04/25/15  0345  04/26/15  1125  WBC  8.8   --   HGB  12.9*   --  PLT  251   --   NA  133*  135  BUN  14  9  CREATININE  1.08  0.85     Problem List:  Patient Active Problem List   Diagnosis Date Noted  . Palliative care encounter 04/26/2015  . Hepatic encephalopathy (HCC) 04/23/2015  . Hyponatremia 04/23/2015  . History of alcohol abuse 04/16/2015  . AKI (acute kidney injury) (HCC) 04/16/2015  . DM (diabetes mellitus) with complications (HCC) 04/16/2015  . GERD without esophagitis 04/16/2015  . Peripheral neuropathy (HCC) 04/16/2015  . 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 Lactic acidosis   . Hypotension 04/06/2015  . Encephalopathy, hepatic (HCC) 02/15/2015  . Bilateral edema of lower extremity   . Acute bronchitis   . Decompensated hepatic cirrhosis (HCC) 09/21/2014  . Microcytic anemia 09/21/2014  . Hypokalemia 09/21/2014  . Dyspnea 09/21/2014  .  Alcohol dependence (HCC) 09/21/2014  . Tobacco abuse 09/21/2014  . Lower extremity edema 09/21/2014  . Spontaneous bacterial peritonitis (HCC) 09/20/2014  . Hyperglycemia 07/26/2013  . Essential hypertension, benign 04/04/2013  . Uncontrolled type 2 diabetes mellitus (HCC) 04/04/2013  . Cirrhosis, alcoholic/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 03/03/2013  . Hepatitis C 03/03/2013  . Coagulopathy (HCC) 03/03/2013  . Chronic wound of extremity-right great toe 03/03/2013  . Decreased pedal pulses 03/03/2013  . Ascites 03/03/2013     Palliative Care Assessment & Plan    Code Status:  FULL  Goals of Care:  Continue care with hopes of stabilization and optimization of QOL.   Symptom Management:  Poor appetite: Recommend nutrition support with Glucerna.   Ascites: Paracentesis as needed. Recommend peritoneal drain but he cannot manage this at home.   Encephalopathy: Continue lactulose to optimize.  Prognosis: Possibly < 6 months  Discharge Planning: SNF is recommended if he will agree.    Thank you for allowing the Palliative Medicine Team to assist in the care of this patient.   Time In: 1500 Time Out: 1530 Total Time Prolonged Time Billed  no    Greater than 50%  of this time was spent counseling and coordinating care related to the above assessment and plan.  Yong Channel, NP Palliative Medicine Team Pager # [phone] (M-F 8a-5p) Team Phone # [phone] (Nights/Weekends)  04/27/2015, 3:28 PM

## 2015-04-27 NOTE — Progress Notes (Signed)
[facility] PROGRESS NOTE  Curtis Estrada JXB:[id number] DOB: Aug 19, 1955 DOA: 04/23/2015 PCP: [facility] Interim summary: 59 y/o male with PMH of HTN, DM, Alcoholism, Hep C, Liver cirrhosis complicated with ascites, recurrent paracentesis, encephalopathy recently admitted (10/3-10/8), non compliance is readmitted with confusion, hepatic encephalopathy  Assessment/Plan: 1. Acute encephalopathy. Hepatic encephalopathy with advanced liver cirrhosis. Ammonia is 98 on admission. Neuro exam is no focal. ammonia level improved.  -restarted lactulose, rifaximin. Mental status is improved. - refused lactulose this am,  - he is probably non compliant to medications, fluids .    2. Advanced liver cirrhosis, recurrent ascitis. Hypoalbuminemia, coagulopathy. Alcoholism. Hep C. - underwent US  Guided paracentesis and removed 7.4 lit of fluid on 10/11 and 10/14 of 4.7liters,.  -. resume lasix and spironolactone and monitor renal function. .  - his fluid cultures have been negative so far, plan to stop the rocephin tomorrow, if they are negative.   3. AKI. Cont gentle IVF.  Resolved with fluids. Stoped IV fluids and resumed lasix and spironolactone.  4. DM.  CBG (last 3)   Recent Labs  04/26/15 2224 04/27/15 0734 04/27/15 1209  GLUCAP 161* 143* 188*    SSI.    Anemia: Stable.   Code Status: full code.  Family Communication: none at bedside. Disposition Plan: pending PT EVAL, patient is open to SNF rehab.    Consultants:  Palliative care.   Procedures:  US guided paracentesis.   Antibiotics:  IV rocephin.   HPI/Subjective: Reports he is tired today but wants to know if he can go to rehab.   Objective: Filed Vitals:   04/27/15 1352  BP: 84/64  Pulse: 85  Temp: 97.8 F (36.6 C)  Resp: 16    Intake/Output Summary (Last 24 hours) at 04/27/15 1609 Last data filed at 04/27/15 1033  Gross per 24 hour  Intake 474.25 ml  Output      0 ml  Net 474.25 ml    Filed Weights   04/25/15 0400 04/26/15 0545 04/27/15 0638  Weight: 78.6 kg (173 lb 4.5 oz) 78.7 kg (173 lb 8 oz) 79.153 kg (174 lb 8 oz)    Exam:   General:  Alert afebrile comfortable  Cardiovascular: s1s2  Respiratory: diminished at bases, no wheezing or rhonchi  Abdomen: soft distended no tenderness.   Musculoskeletal: no pedal edema.   Data Reviewed: Basic Metabolic Panel:  Recent Labs Lab 04/24/15 0532 04/24/15 0855 04/24/15 1240 04/25/15 0345 04/26/15 1125  NA 132* 131* 132* 133* 135  K 4.1 4.1 4.0 4.1 3.8  CL 97* 98* 99* 102 105  CO2 GLUCOSE 169* 179* 260* 162* 176*  BUN CREATININE 1.58* 1.54* 1.49* 1.08 0.85  CALCIUM 8.9 8.8* 8.8* 8.5* 8.3*   Liver Function Tests:  Recent Labs Lab 04/23/15 1736 04/23/15 2111  AST 77* 69*  ALT 32 35  ALKPHOS 103 108  BILITOT 1.3* 1.6*  PROT 6.7 6.4*  ALBUMIN 2.2* 2.2*   No results for input(s): LIPASE, AMYLASE in the last 168 hours.  Recent Labs Lab 04/23/15 2111 04/25/15 0345  AMMONIA 98* 51*   CBC:  Recent Labs Lab 04/23/15 2111 04/24/15 0100 04/25/15 0345  WBC 11.1* 9.8 8.8  NEUTROABS 6.4 5.6  --   HGB 14.9 13.4 12.9*  HCT 44.7 40.6 38.1*  MCV 69.4* 69.0* 69.0*  PLT 321 291 251   Cardiac Enzymes: No results for input(s): CKTOTAL, CKMB, CKMBINDEX, TROPONINI in the  last 168 hours. BNP (last 3 results)  Recent Labs  02/12/15 0907 04/16/15 2101 04/23/15 1943  BNP 19.0 16.3 25.7    ProBNP (last 3 results) No results for input(s): PROBNP in the last 8760 hours.  CBG:  Recent Labs Lab 04/26/15 1200 04/26/15 1643 04/26/15 2224 04/27/15 0734 04/27/15 1209  GLUCAP 159* 130* 161* 143* 188*    Recent Results (from the past 240 hour(s))  Culture, body fluid-bottle     Status: None   Collection Time: 04/19/15 11:22 AM  Result Value Ref Range Status   Specimen Description FLUID ABDOMEN PERITONEAL  Final   Special Requests NONE  Final   Culture NO  GROWTH 5 DAYS  Final   Report Status 04/24/2015 FINAL  Final  Gram stain     Status: None   Collection Time: 04/19/15 11:22 AM  Result Value Ref Range Status   Specimen Description FLUID ABDOMEN PERITONEAL  Final   Special Requests NONE  Final   Gram Stain   Final    MODERATE WBC PRESENT, PREDOMINANTLY MONONUCLEAR NO ORGANISMS SEEN    Report Status 04/19/2015 FINAL  Final  Culture, blood (routine x 2)     Status: None (Preliminary result)   Collection Time: 04/23/15  7:30 PM  Result Value Ref Range Status   Specimen Description BLOOD LEFT ANTECUBITAL  Final   Special Requests BOTTLES DRAWN AEROBIC AND ANAEROBIC 5 CC   Final   Culture   Final    NO GROWTH 4 DAYS Performed at [facility]    Report Status PENDING  Incomplete  Culture, blood (routine x 2)     Status: None (Preliminary result)   Collection Time: 04/23/15  7:40 PM  Result Value Ref Range Status   Specimen Description BLOOD RIGHT ARM  Final   Special Requests BOTTLES DRAWN AEROBIC ONLY 6 CC  Final   Culture   Final    NO GROWTH 4 DAYS Performed at [facility]    Report Status PENDING  Incomplete  Culture, body fluid-bottle     Status: None (Preliminary result)   Collection Time: 04/23/15  8:15 PM  Result Value Ref Range Status   Specimen Description FLUID  Final   Special Requests NONE  Final   Culture   Final    NO GROWTH 3 DAYS Performed at [facility]    Report Status PENDING  Incomplete  Urine culture     Status: None   Collection Time: 04/23/15  9:01 PM  Result Value Ref Range Status   Specimen Description URINE, CATHETERIZED  Final   Special Requests Normal  Final   Culture   Final    NO GROWTH 1 DAY Performed at [facility]    Report Status 04/25/2015 FINAL  Final  MRSA PCR Screening     Status: None   Collection Time: 04/23/15 10:23 PM  Result Value Ref Range Status   MRSA by PCR NEGATIVE NEGATIVE Final    Comment:        The GeneXpert MRSA Assay (FDA approved  for NASAL specimens only), is one component of a comprehensive MRSA colonization surveillance program. It is not intended to diagnose MRSA infection nor to guide or monitor treatment for MRSA infections.   Culture, body fluid-bottle     Status: None (Preliminary result)   Collection Time: 04/24/15  2:23 PM  Result Value Ref Range Status   Specimen Description FLUID ASCITIC  Final   Special Requests NONE  Final  Culture NO GROWTH 3 DAYS  Final   Report Status PENDING  Incomplete  Gram stain     Status: None   Collection Time: 04/24/15  2:23 PM  Result Value Ref Range Status   Specimen Description FLUID ASCITIC  Final   Special Requests NONE  Final   Gram Stain   Final    FEW WBC PRESENT, PREDOMINANTLY MONONUCLEAR NO ORGANISMS SEEN    Report Status 04/24/2015 FINAL  Final     Studies: [place] Paracentesis  04/27/2015  INDICATION: Hepatitis C, cirrhosis, recurrent ascites. Request is made for therapeutic paracentesis. EXAM: ULTRASOUND-GUIDED THERAPEUTIC PARACENTESIS COMPARISON:  Prior paracentesis on 04/24/2015 MEDICATIONS: None. COMPLICATIONS: None immediate TECHNIQUE: Informed written consent was obtained from the patient after a discussion of the risks, benefits and alternatives to treatment. A timeout was performed prior to the initiation of the procedure. Initial ultrasound scanning demonstrates a moderate to large amount of ascites within the left lower abdominal quadrant. The left lower abdomen was prepped and draped in the usual sterile fashion. 1% lidocaine was used for local anesthesia. Under direct ultrasound guidance, a 19 gauge, 10-cm, Yueh catheter was introduced. An ultrasound image was saved for documentation purposed. The paracentesis was performed. The catheter was removed and a dressing was applied. The patient tolerated the procedure well without immediate post procedural complication. FINDINGS: A total of approximately 4.7 liters of turbid, yellow fluid was removed.  IMPRESSION: Successful ultrasound-guided therapeutic paracentesis yielding 4.7 liters of peritoneal fluid. Read by: Jeananne RamaKevin Allred, PA-C Electronically Signed   By: Irish LackGlenn  Yamagata M.D.   On: 04/27/2015 12:28    Scheduled Meds: . antiseptic oral rinse  7 mL Mouth Rinse BID  . cefTRIAXone (ROCEPHIN)  IV  2 g Intravenous Q24H  . folic acid  1 mg Oral Daily  . furosemide  80 mg Oral BID  . insulin aspart  0-9 Units Subcutaneous TID AC & HS  . lactulose  30 g Oral TID  . pantoprazole  40 mg Oral BID  . propranolol  10 mg Oral BID  . rifaximin  550 mg Oral BID  . sodium chloride  3 mL Intravenous Q12H  . spironolactone  25 mg Oral Daily   Continuous Infusions:    Principal Problem:   Hepatic encephalopathy (HCC) Active Problems:   Hepatitis C   Ascites   Essential hypertension, benign   Uncontrolled type 2 diabetes mellitus (HCC)   Decompensated hepatic cirrhosis (HCC)   AKI (acute kidney injury) (HCC)   GERD without esophagitis   Lactic acidosis   Hyponatremia   Palliative care encounter    Time spent: 25 minutes    Waneda Klammer  [facility] Pager [phone] 7PM-7AM, please contact night-coverage at [email or web address], password [email or web address] 04/27/2015, 4:09 PM  LOS: 4 days

## 2015-04-27 NOTE — Procedures (Signed)
US guided therapeutic paracentesis performed yielding 4.7 liters turbid, yellow fluid. No immediate complications.

## 2015-04-28 LAB — BASIC METABOLIC PANEL
ANION GAP: 8 (ref 5–15)
BUN: 12 mg/dL (ref 6–20)
CHLORIDE: 100 mmol/L — AB (ref 101–111)
CO2: 24 mmol/L (ref 22–32)
Calcium: 8.6 mg/dL — ABNORMAL LOW (ref 8.9–10.3)
Creatinine, Ser: 0.9 mg/dL (ref 0.61–1.24)
Glucose, Bld: 151 mg/dL — ABNORMAL HIGH (ref 65–99)
POTASSIUM: 4 mmol/L (ref 3.5–5.1)
Sodium: 132 mmol/L — ABNORMAL LOW (ref 135–145)

## 2015-04-28 LAB — GLUCOSE, CAPILLARY
GLUCOSE-CAPILLARY: 132 mg/dL — AB (ref 65–99)
GLUCOSE-CAPILLARY: 216 mg/dL — AB (ref 65–99)
GLUCOSE-CAPILLARY: 157 mg/dL — AB (ref 65–99)

## 2015-04-28 LAB — MAGNESIUM: Magnesium: 1.6 mg/dL — ABNORMAL LOW (ref 1.7–2.4)

## 2015-04-28 LAB — CULTURE, BLOOD (ROUTINE X 2)
CULTURE: NO GROWTH
CULTURE: NO GROWTH

## 2015-04-28 MED ORDER — FUROSEMIDE 40 MG PO TABS
40.0000 mg | ORAL_TABLET | Freq: Two times a day (BID) | ORAL | Status: DC
  Administered 2015-04-28 – 2015-04-30 (×4): 40 mg via ORAL
  Filled 2015-04-28 (×4): qty 1

## 2015-04-28 MED ORDER — OXYCODONE HCL 5 MG PO TABS
5.0000 mg | ORAL_TABLET | Freq: Once | ORAL | Status: AC
  Administered 2015-04-28: 5 mg via ORAL
  Filled 2015-04-28: qty 1

## 2015-04-28 NOTE — Evaluation (Signed)
Physical Therapy One Time Evaluation Patient Details Name: Oluwatimileyin Vivier MRN: [id number] DOB: 1955-09-30 Today's Date: 04/28/2015   History of Present Illness  59 y/o male with PMH of HTN, DM, Alcoholism, Hep C, Liver cirrhosis complicated with ascites, recurrent paracentesis, encephalopathy recently admitted (10/3-10/8), non compliance is readmitted with confusion, hepatic encephalopathy.  Pt s/p USGuided paracentesis and removed 7.4 liters of fluid on 10/11 and 10/14 of 4.7 liters  Clinical Impression  Patient evaluated by Physical Therapy with no further acute PT needs identified. All education has been completed and the patient has no further questions.  Pt presents at modified independent level with mobility.  See below for any follow-up Physical Therapy or equipment needs. PT is signing off. Thank you for this referral.     Follow Up Recommendations No PT follow up    Equipment Recommendations  Wheelchair (measurements PT) (18" width w/c recommended last admission)    Recommendations for Other Services       Precautions / Restrictions Precautions Precautions: None Restrictions Weight Bearing Restrictions: No      Mobility  Bed Mobility Overal bed mobility: Independent                Transfers Overall transfer level: Modified independent Equipment used: None                Ambulation/Gait Ambulation/Gait assistance: Supervision;Modified independent (Device/Increase time) Ambulation Distance (Feet): 400 Feet Assistive device: None Gait Pattern/deviations: Step-through pattern;Wide base of support     General Gait Details: steady gait, no LOB observed, denies any symptoms  Stairs            Wheelchair Mobility    Modified Rankin (Stroke Patients Only)       Balance Overall balance assessment: Modified Independent                           High level balance activites: Backward walking;Sudden stops;Turns;Head turns High  Level Balance Comments: pt performed above activities without difficulty             Pertinent Vitals/Pain Pain Assessment: No/denies pain    Home Living Family/patient expects to be discharged to:: Private residence   Available Help at Discharge: Friend(s);Available PRN/intermittently;Family Type of Home: Apartment Home Access: Elevator     Home Layout: One level Home Equipment: Environmental consultant - 2 wheels;Cane - single point;Adaptive equipment      Prior Function Level of Independence: Independent with assistive device(s)         Comments: Per pt only able to manage household distances not community distances due to SOB when fluid begins accumulating.      Hand Dominance   Dominant Hand: Right    Extremity/Trunk Assessment   Upper Extremity Assessment: Overall WFL for tasks assessed           Lower Extremity Assessment: Overall WFL for tasks assessed         Communication   Communication: No difficulties  Cognition Arousal/Alertness: Awake/alert Behavior During Therapy: WFL for tasks assessed/performed Overall Cognitive Status: Within Functional Limits for tasks assessed                      General Comments      Exercises        Assessment/Plan    PT Assessment Patent does not need any further PT services  PT Diagnosis Difficulty walking   PT Problem List    PT Treatment Interventions  PT Goals (Current goals can be found in the Care Plan section) Acute Rehab PT Goals PT Goal Formulation: All assessment and education complete, DC therapy    Frequency     Barriers to discharge        Co-evaluation               End of Session Equipment Utilized During Treatment: Gait belt Activity Tolerance: Patient tolerated treatment well Patient left: in bed;with call bell/phone within reach Nurse Communication: Mobility status         Time: [id number] PT Time Calculation (min) (ACUTE ONLY): 8 min   Charges:   PT  Evaluation $Initial PT Evaluation Tier I: 1 Procedure     PT G Codes:        Mikhai Bienvenue,KATHrine E 04/28/2015, 12:14 PM Zenovia JarredKati Clemma Johnsen, PT, DPT 04/28/2015 Pager: [phone]

## 2015-04-28 NOTE — Progress Notes (Signed)
[facility] PROGRESS NOTE  Curtis CarbonWillie Estrada EAV:[id number] DOB: 1955-10-20 DOA: 04/23/2015 [facility] Interim summary: 59 y/o male with PMH of HTN, DM, Alcoholism, Hep C, Liver cirrhosis complicated with ascites, recurrent paracentesis, encephalopathy recently admitted (10/3-10/8), non compliance is readmitted with confusion, hepatic encephalopathy  Assessment/Plan: 1. Acute encephalopathy. Hepatic encephalopathy with advanced liver cirrhosis. Ammonia is 98 on admission. Neuro exam is no focal. ammonia level improved.  -restarted lactulose, rifaximin. Mental status is improved. - he is probably non compliant to medications, fluids .  - he is alert and oriented at this time.    2. Advanced liver cirrhosis, recurrent ascitis. Hypoalbuminemia, coagulopathy. Alcoholism. Hep C. - underwent US  Guided paracentesis and removed 7.4 lit of fluid on 10/11 and 10/14 of 4.7liters,.  -. resume lasix and spironolactone and monitor renal function. .  - his fluid cultures have been negative so far,stopped rocephin today after 5 days.  - decreased the dose of lasix to 40 mg BID.   3. AKI. Cont gentle IVF.  Resolved with fluids. Stoped IV fluids and resumed lasix and spironolactone.  4. DM.  CBG (last 3)   Recent Labs  04/28/15 0739 04/28/15 1142 04/28/15 1647  GLUCAP 132* 216* 157*    SSI.    Anemia: Stable.   HYPOTENSION: Decreased the dose of lasix to 40 mg bid, held the morning dose of lasix.  If his hypotension is resolved he can be discharged in am home.    Disposition: Pt recommended no PT follow up .  Family wanted him to go to a ALF or SNF, but currently he does not have any skilled needs, but since he is non compliant to meds and diet and has repeated admissions, family is trying to convince him to go to ALF , as he has [facility] benefits.   Code Status: full code.  Family Communication: none at bedside. Disposition Plan: home in am, if hypotension is resolved.     Consultants:  Palliative care.   Procedures:  US guided paracentesis.   Antibiotics:  IV rocephin.   HPI/Subjective: He wants to go home in am. Not to rehab or ALF.   Objective: Filed Vitals:   04/28/15 1304  BP: 90/68  Pulse: 102  Temp: 98.1 F (36.7 C)  Resp: 19    Intake/Output Summary (Last 24 hours) at 04/28/15 1831 Last data filed at 04/28/15 0854  Gross per 24 hour  Intake     80 ml  Output      0 ml  Net     80 ml   Filed Weights   04/26/15 0545 04/27/15 0638 04/28/15 0556  Weight: 78.7 kg (173 lb 8 oz) 79.153 kg (174 lb 8 oz) 75.66 kg (166 lb 12.8 oz)    Exam:   General:  Alert afebrile comfortable  Cardiovascular: s1s2  Respiratory: diminished at bases, no wheezing or rhonchi  Abdomen: soft distended no tenderness.   Musculoskeletal: no pedal edema.   Data Reviewed: Basic Metabolic Panel:  Recent Labs Lab 04/24/15 0855 04/24/15 1240 04/25/15 0345 04/26/15 1125 04/28/15 0608  NA 131* 132* 133* 135 132*  K 4.1 4.0 4.1 3.8 4.0  CL 98* 99* 102 105 100*  CO2 25 25 22 25 24   GLUCOSE 179* 260* 162* 176* 151*  BUN 20 19 14 9 12   CREATININE 1.54* 1.49* 1.08 0.85 0.90  CALCIUM 8.8* 8.8* 8.5* 8.3* 8.6*  MG  --   --   --   --  1.6*  Liver Function Tests:  Recent Labs Lab 04/23/15 1736 04/23/15 2111  AST 77* 69*  ALT 32 35  ALKPHOS 103 108  BILITOT 1.3* 1.6*  PROT 6.7 6.4*  ALBUMIN 2.2* 2.2*   No results for input(s): LIPASE, AMYLASE in the last 168 hours.  Recent Labs Lab 04/23/15 2111 04/25/15 0345  AMMONIA 98* 51*   CBC:  Recent Labs Lab 04/23/15 2111 04/24/15 0100 04/25/15 0345  WBC 11.1* 9.8 8.8  NEUTROABS 6.4 5.6  --   HGB 14.9 13.4 12.9*  HCT 44.7 40.6 38.1*  MCV 69.4* 69.0* 69.0*  PLT 321 291 251   Cardiac Enzymes: No results for input(s): CKTOTAL, CKMB, CKMBINDEX, TROPONINI in the last 168 hours. BNP (last 3 results)  Recent Labs  02/12/15 0907 04/16/15 2101 04/23/15 1943  BNP 19.0 16.3 25.7     ProBNP (last 3 results) No results for input(s): PROBNP in the last 8760 hours.  CBG:  Recent Labs Lab 04/27/15 1646 04/27/15 2110 04/28/15 0739 04/28/15 1142 04/28/15 1647  GLUCAP 174* 157* 132* 216* 157*    Recent Results (from the past 240 hour(s))  Culture, body fluid-bottle     Status: None   Collection Time: 04/19/15 11:22 AM  Result Value Ref Range Status   Specimen Description FLUID ABDOMEN PERITONEAL  Final   Special Requests NONE  Final   Culture NO GROWTH 5 DAYS  Final   Report Status 04/24/2015 FINAL  Final  Gram stain     Status: None   Collection Time: 04/19/15 11:22 AM  Result Value Ref Range Status   Specimen Description FLUID ABDOMEN PERITONEAL  Final   Special Requests NONE  Final   Gram Stain   Final    MODERATE WBC PRESENT, PREDOMINANTLY MONONUCLEAR NO ORGANISMS SEEN    Report Status 04/19/2015 FINAL  Final  Culture, blood (routine x 2)     Status: None   Collection Time: 04/23/15  7:30 PM  Result Value Ref Range Status   Specimen Description BLOOD LEFT ANTECUBITAL  Final   Special Requests BOTTLES DRAWN AEROBIC AND ANAEROBIC 5 CC   Final   Culture   Final    NO GROWTH 5 DAYS Performed at [facility]    Report Status 04/28/2015 FINAL  Final  Culture, blood (routine x 2)     Status: None   Collection Time: 04/23/15  7:40 PM  Result Value Ref Range Status   Specimen Description BLOOD RIGHT ARM  Final   Special Requests BOTTLES DRAWN AEROBIC ONLY 6 CC  Final   Culture   Final    NO GROWTH 5 DAYS Performed at [facility]    Report Status 04/28/2015 FINAL  Final  Culture, body fluid-bottle     Status: None (Preliminary result)   Collection Time: 04/23/15  8:15 PM  Result Value Ref Range Status   Specimen Description FLUID  Final   Special Requests NONE  Final   Culture   Final    NO GROWTH 4 DAYS Performed at [facility]    Report Status PENDING  Incomplete  Urine culture     Status: None   Collection Time:  04/23/15  9:01 PM  Result Value Ref Range Status   Specimen Description URINE, CATHETERIZED  Final   Special Requests Normal  Final   Culture   Final    NO GROWTH 1 DAY Performed at [facility]    Report Status 04/25/2015 FINAL  Final  MRSA PCR Screening  Status: None   Collection Time: 04/23/15 10:23 PM  Result Value Ref Range Status   MRSA by PCR NEGATIVE NEGATIVE Final    Comment:        The GeneXpert MRSA Assay (FDA approved for NASAL specimens only), is one component of a comprehensive MRSA colonization surveillance program. It is not intended to diagnose MRSA infection nor to guide or monitor treatment for MRSA infections.   Culture, body fluid-bottle     Status: None (Preliminary result)   Collection Time: 04/24/15  2:23 PM  Result Value Ref Range Status   Specimen Description FLUID ASCITIC  Final   Special Requests NONE  Final   Culture NO GROWTH 4 DAYS  Final   Report Status PENDING  Incomplete  Gram stain     Status: None   Collection Time: 04/24/15  2:23 PM  Result Value Ref Range Status   Specimen Description FLUID ASCITIC  Final   Special Requests NONE  Final   Gram Stain   Final    FEW WBC PRESENT, PREDOMINANTLY MONONUCLEAR NO ORGANISMS SEEN    Report Status 04/24/2015 FINAL  Final     Studies: US Paracentesis  04/27/2015  INDICATION: Hepatitis C, cirrhosis, recurrent ascites. Request is made for therapeutic paracentesis. EXAM: ULTRASOUND-GUIDED THERAPEUTIC PARACENTESIS COMPARISON:  Prior paracentesis on 04/24/2015 MEDICATIONS: None. COMPLICATIONS: None immediate TECHNIQUE: Informed written consent was obtained from the patient after a discussion of the risks, benefits and alternatives to treatment. A timeout was performed prior to the initiation of the procedure. Initial ultrasound scanning demonstrates a moderate to large amount of ascites within the left lower abdominal quadrant. The left lower abdomen was prepped and draped in the usual  sterile fashion. 1% lidocaine was used for local anesthesia. Under direct ultrasound guidance, a 19 gauge, 10-cm, Yueh catheter was introduced. An ultrasound image was saved for documentation purposed. The paracentesis was performed. The catheter was removed and a dressing was applied. The patient tolerated the procedure well without immediate post procedural complication. FINDINGS: A total of approximately 4.7 liters of turbid, yellow fluid was removed. IMPRESSION: Successful ultrasound-guided therapeutic paracentesis yielding 4.7 liters of peritoneal fluid. Read by: Jeananne Rama, PA-C Electronically Signed   By: Irish Lack M.D.   On: 04/27/2015 12:28    Scheduled Meds: . antiseptic oral rinse  7 mL Mouth Rinse BID  . folic acid  1 mg Oral Daily  . furosemide  40 mg Oral BID  . insulin aspart  0-9 Units Subcutaneous TID AC & HS  . lactulose  30 g Oral TID  . pantoprazole  40 mg Oral BID  . propranolol  10 mg Oral BID  . rifaximin  550 mg Oral BID  . sodium chloride  3 mL Intravenous Q12H  . spironolactone  25 mg Oral Daily   Continuous Infusions:    Principal Problem:   Hepatic encephalopathy (HCC) Active Problems:   Hepatitis C   Ascites   Essential hypertension, benign   Uncontrolled type 2 diabetes mellitus (HCC)   Decompensated hepatic cirrhosis (HCC)   AKI (acute kidney injury) (HCC)   GERD without esophagitis   Lactic acidosis   Hyponatremia   Palliative care encounter    Time spent: 25 minutes    Ewan Grau  [facility] Pager [phone] 7PM-7AM, please contact night-coverage at [email or web address], password [facility] 04/28/2015, 6:31 PM  LOS: 5 days

## 2015-04-28 NOTE — Clinical Social Work Note (Addendum)
Clinical Social Work Assessment  Patient Details  Name: Curtis Estrada MRN: [id number] Date of Birth: Feb 09, 1956  Date of referral:  04/28/15               Reason for consult:  Facility Placement, Discharge Planning                Permission sought to share information with:    Permission granted to share information::  No  Name::        Agency::     Relationship::     Contact Information:     Housing/Transportation Living arrangements for the past 2 months:  Apartment Source of Information:  Patient Patient Interpreter Needed:  None Criminal Activity/Legal Involvement Pertinent to Current Situation/Hospitalization:  No - Comment as needed Significant Relationships:  Siblings Lives with:  Self Do you feel safe going back to the place where you live?  Yes Need for family participation in patient care:  Yes (Comment)  Care giving concerns:  Patient has no concerns about returning home. Per patient his sisters keep trying to make him go to a facility.   Social Worker assessment / plan:  Weekend CSW received a handoff on patient stating RNCM said patient is for SNF placement. CSW has reviewed chart and patient is not appropriate for SNF level of care and the patient is not agreeable to this disposition. CSW met with patient at bedside to complete assessment. The patient was welcoming of CSW and willing to discuss a discharge plan. The patient states that he is very irritated by his sisters who continue to try to force him into going to a SNF.CSW explained to the patient that as long as the patient is competent and capable of making his own decisions no one, including his sisters, can force him into going to a facility. The patient states that he plans to return home to his apartment. He states he is able to get his medications and to his appointments. CSW has reviewed PT evaluation, and it appears the patient is doing well, at this time the patient doesn't present with a skilled need. CSW  signing off.  Employment status:  Disabled (Comment on whether or not currently receiving Disability) Insurance information:  Medicaid [place] PT Recommendations:  No Follow Up Information / Referral to community resources:  Other (Comment Required)  Patient/Family's Response to care:  Patient is anxious to return home, he states he doesn't like being here.   Patient/Family's Understanding of and Emotional Response to Diagnosis, Current Treatment, and Prognosis:  The patient appears to have fair understanding of reason for admission and his post DC needs. Per patient, the family is pressuring him to go to a facility.   Emotional Assessment Appearance:  Appears stated age Attitude/Demeanor/Rapport:  Other (Appropriate) Affect (typically observed):  Accepting, Appropriate, Calm Orientation:  Oriented to Self, Oriented to Place, Oriented to  Time, Oriented to Situation Alcohol / Substance use:  Alcohol Use, Tobacco Use Psych involvement (Current and /or in the community):  No (Comment)  Discharge Needs  Concerns to be addressed:  Discharge Planning Concerns Readmission within the last 30 days:  Yes Current discharge risk:  Substance Abuse, Chronically ill Barriers to Discharge:  No Barriers Identified  Liz Beach MSW, [place], [place], [phone]

## 2015-04-29 DIAGNOSIS — E871 Hypo-osmolality and hyponatremia: Secondary | ICD-10-CM

## 2015-04-29 LAB — CULTURE, BODY FLUID W GRAM STAIN -BOTTLE
Culture: NO GROWTH
Culture: NO GROWTH

## 2015-04-29 LAB — GLUCOSE, CAPILLARY
GLUCOSE-CAPILLARY: 214 mg/dL — AB (ref 65–99)
GLUCOSE-CAPILLARY: 153 mg/dL — AB (ref 65–99)
GLUCOSE-CAPILLARY: 156 mg/dL — AB (ref 65–99)
Glucose-Capillary: 155 mg/dL — ABNORMAL HIGH (ref 65–99)
Glucose-Capillary: 159 mg/dL — ABNORMAL HIGH (ref 65–99)

## 2015-04-29 LAB — CULTURE, BODY FLUID-BOTTLE

## 2015-04-29 MED ORDER — MAGNESIUM SULFATE 50 % IJ SOLN
3.0000 g | Freq: Once | INTRAMUSCULAR | Status: AC
  Administered 2015-04-29: 3 g via INTRAVENOUS
  Filled 2015-04-29: qty 6

## 2015-04-29 MED ORDER — FUROSEMIDE 40 MG PO TABS: 40.0000 mg | ORAL_TABLET | Freq: Two times a day (BID) | ORAL | Status: DC

## 2015-04-29 MED ORDER — INSULIN DETEMIR 100 UNIT/ML ~~LOC~~ SOLN: 10.0000 [IU] | Freq: Every day | SUBCUTANEOUS | Status: AC

## 2015-04-29 NOTE — Progress Notes (Signed)
[facility] PROGRESS NOTE  Curtis Estrada ZOX:[id number] DOB: 05-22-1956 DOA: 04/23/2015 PCP: [facility] Interim summary: 59 y/o male with PMH of HTN, DM, Alcoholism, Hep C, Liver cirrhosis complicated with ascites, recurrent paracentesis, encephalopathy recently admitted (10/3-10/8), non compliance is readmitted with confusion, hepatic encephalopathy  Assessment/Plan: 1. Acute encephalopathy. Hepatic encephalopathy with advanced liver cirrhosis. Ammonia is 98 on admission. Neuro exam is no focal. ammonia level improved.  -restarted lactulose, rifaximin. Mental status is improved. - he is probably non compliant to medications, fluids .  - he is alert and oriented at this time.    2. Advanced liver cirrhosis, recurrent ascitis. Hypoalbuminemia, coagulopathy. Alcoholism. Hep C. - underwent US  Guided paracentesis and removed 7.4 lit of fluid on 10/11 and 10/14 of 4.7liters,.  -. resume lasix and spironolactone and monitor renal function. .  - his fluid cultures have been negative so far,stopped rocephin today after 5 days.  - decreased the dose of lasix to 40 mg BID.   3. AKI. Cont gentle IVF.  Resolved with fluids. Stoped IV fluids and resumed lasix and spironolactone.  4. DM.  CBG (last 3)   Recent Labs  04/29/15 0749 04/29/15 1141 04/29/15 1633  GLUCAP 159* 214* 153*    SSI.    Anemia: Stable.   HYPOTENSION: Decreased the dose of lasix to 40 mg bid,  Resolved.   Hypomagnesemia: Replete as needed.  Repeat in am.    Disposition: Pt recommended no PT follow up .  Family wanted him to go to a ALF or SNF, but currently he does not have any skilled needs, but since he is non compliant to meds and diet and has repeated admissions, family is trying to convince him to go to ALF , as he has [facility] benefits.  Social worker consulted and plan for ALF OR SNF  When bed available.   Code Status: full code.  Family Communication: none at bedside. Disposition Plan: d/c  in am, whether to ALF or SNF.    Consultants:  Palliative care.   Procedures:  US guided paracentesis.   Antibiotics:  IV rocephin.   HPI/Subjective: He wants to go home in am. Not to rehab or ALF.   Objective: Filed Vitals:   04/29/15 1256  BP: 91/64  Pulse: 93  Temp: 98 F (36.7 C)  Resp: 18    Intake/Output Summary (Last 24 hours) at 04/29/15 1808 Last data filed at 04/29/15 1230  Gross per 24 hour  Intake    240 ml  Output      0 ml  Net    240 ml   Filed Weights   04/27/15 0638 04/28/15 0556 04/29/15 0500  Weight: 79.153 kg (174 lb 8 oz) 75.66 kg (166 lb 12.8 oz) 76.5 kg (168 lb 10.4 oz)    Exam:   General:  Alert afebrile comfortable  Cardiovascular: s1s2  Respiratory: diminished at bases, no wheezing or rhonchi  Abdomen: soft distended no tenderness.   Musculoskeletal: no pedal edema.   Data Reviewed: Basic Metabolic Panel:  Recent Labs Lab 04/24/15 0855 04/24/15 1240 04/25/15 0345 04/26/15 1125 04/28/15 0608  NA 131* 132* 133* 135 132*  K 4.1 4.0 4.1 3.8 4.0  CL 98* 99* 102 105 100*  CO2 GLUCOSE 179* 260* 162* 176* 151*  BUN CREATININE 1.54* 1.49* 1.08 0.85 0.90  CALCIUM 8.8* 8.8* 8.5* 8.3* 8.6*  MG  --   --   --   --  1.6*   Liver Function Tests:  Recent Labs Lab 04/23/15 1736 04/23/15 2111  AST 77* 69*  ALT 32 35  ALKPHOS 103 108  BILITOT 1.3* 1.6*  PROT 6.7 6.4*  ALBUMIN 2.2* 2.2*   No results for input(s): LIPASE, AMYLASE in the last 168 hours.  Recent Labs Lab 04/23/15 2111 04/25/15 0345  AMMONIA 98* 51*   CBC:  Recent Labs Lab 04/23/15 2111 04/24/15 0100 04/25/15 0345  WBC 11.1* 9.8 8.8  NEUTROABS 6.4 5.6  --   HGB 14.9 13.4 12.9*  HCT 44.7 40.6 38.1*  MCV 69.4* 69.0* 69.0*  PLT 321 291 251   Cardiac Enzymes: No results for input(s): CKTOTAL, CKMB, CKMBINDEX, TROPONINI in the last 168 hours. BNP (last 3 results)  Recent Labs  02/12/15 0907 04/16/15 2101  04/23/15 1943  BNP 19.0 16.3 25.7    ProBNP (last 3 results) No results for input(s): PROBNP in the last 8760 hours.  CBG:  Recent Labs Lab 04/28/15 1647 04/28/15 2251 04/29/15 0749 04/29/15 1141 04/29/15 1633  GLUCAP 157* 155* 159* 214* 153*    Recent Results (from the past 240 hour(s))  Culture, blood (routine x 2)     Status: None   Collection Time: 04/23/15  7:30 PM  Result Value Ref Range Status   Specimen Description BLOOD LEFT ANTECUBITAL  Final   Special Requests BOTTLES DRAWN AEROBIC AND ANAEROBIC 5 CC   Final   Culture   Final    NO GROWTH 5 DAYS Performed at [facility]    Report Status 04/28/2015 FINAL  Final  Culture, blood (routine x 2)     Status: None   Collection Time: 04/23/15  7:40 PM  Result Value Ref Range Status   Specimen Description BLOOD RIGHT ARM  Final   Special Requests BOTTLES DRAWN AEROBIC ONLY 6 CC  Final   Culture   Final    NO GROWTH 5 DAYS Performed at [facility]    Report Status 04/28/2015 FINAL  Final  Culture, body fluid-bottle     Status: None   Collection Time: 04/23/15  8:15 PM  Result Value Ref Range Status   Specimen Description FLUID  Final   Special Requests NONE  Final   Culture   Final    NO GROWTH 5 DAYS Performed at [facility]    Report Status 04/29/2015 FINAL  Final  Urine culture     Status: None   Collection Time: 04/23/15  9:01 PM  Result Value Ref Range Status   Specimen Description URINE, CATHETERIZED  Final   Special Requests Normal  Final   Culture   Final    NO GROWTH 1 DAY Performed at [facility]    Report Status 04/25/2015 FINAL  Final  MRSA PCR Screening     Status: None   Collection Time: 04/23/15 10:23 PM  Result Value Ref Range Status   MRSA by PCR NEGATIVE NEGATIVE Final    Comment:        The GeneXpert MRSA Assay (FDA approved for NASAL specimens only), is one component of a comprehensive MRSA colonization surveillance program. It is not intended  to diagnose MRSA infection nor to guide or monitor treatment for MRSA infections.   Culture, body fluid-bottle     Status: None   Collection Time: 04/24/15  2:23 PM  Result Value Ref Range Status   Specimen Description FLUID ASCITIC  Final   Special Requests NONE  Final   Culture NO GROWTH  5 DAYS  Final   Report Status 04/29/2015 FINAL  Final  Gram stain     Status: None   Collection Time: 04/24/15  2:23 PM  Result Value Ref Range Status   Specimen Description FLUID ASCITIC  Final   Special Requests NONE  Final   Gram Stain   Final    FEW WBC PRESENT, PREDOMINANTLY MONONUCLEAR NO ORGANISMS SEEN    Report Status 04/24/2015 FINAL  Final     Studies: No results found.  Scheduled Meds: . antiseptic oral rinse  7 mL Mouth Rinse BID  . folic acid  1 mg Oral Daily  . furosemide  40 mg Oral BID  . insulin aspart  0-9 Units Subcutaneous TID AC & HS  . lactulose  30 g Oral TID  . pantoprazole  40 mg Oral BID  . propranolol  10 mg Oral BID  . rifaximin  550 mg Oral BID  . sodium chloride  3 mL Intravenous Q12H  . spironolactone  25 mg Oral Daily   Continuous Infusions:    Principal Problem:   Hepatic encephalopathy (HCC) Active Problems:   Hepatitis C   Ascites   Essential hypertension, benign   Uncontrolled type 2 diabetes mellitus (HCC)   Decompensated hepatic cirrhosis (HCC)   AKI (acute kidney injury) (HCC)   GERD without esophagitis   Lactic acidosis   Hyponatremia   Palliative care encounter    Time spent: 25 minutes    Curtis Estrada  [facility] Pager [phone] 7PM-7AM, please contact night-coverage at [email or web address], password [email or web address] 04/29/2015, 6:08 PM  LOS: 6 days

## 2015-04-30 LAB — MAGNESIUM: MAGNESIUM: 1.8 mg/dL (ref 1.7–2.4)

## 2015-04-30 LAB — GLUCOSE, CAPILLARY
Glucose-Capillary: 150 mg/dL — ABNORMAL HIGH (ref 65–99)
Glucose-Capillary: 167 mg/dL — ABNORMAL HIGH (ref 65–99)

## 2015-04-30 NOTE — Care Management Note (Signed)
Case Management Note  Patient Details  Name: Curtis Estrada MRN: [id number] Date of Birth: 07-31-1955  Subjective/Objective:     Acute encephalopathy.               Action/Plan: Pt is active with [facility]. NCM will continue to follow for dc needs. Will need resumption of care orders [facility]   Expected Discharge Date:   (unknown)               Expected Discharge Plan:  Home w Home Health Services  In-House Referral:  Clinical Social Work  Discharge planning Services  CM Consult   [facility]  Status of Service:  In process, will continue to follow  Medicare Important Message Given:    Date Medicare IM Given:    Medicare IM give by:    Date Additional Medicare IM Given:    Additional Medicare Important Message give by:     If discussed at Long Length of Stay Meetings, dates discussed:    Additional Comments:  Elliot CousinShavis, Starling Christofferson Ellen, RN 04/30/2015, 8:56 AM

## 2015-04-30 NOTE — Progress Notes (Signed)
Pt's sister Shirlee LimerickMarion at bedside, states the will go to PCP to increase PCS, pt did not need HH at present time, also that she would make sure that he takes his medications.

## 2015-05-01 NOTE — Discharge Summary (Signed)
Physician Discharge Summary  Cindi CarbonWillie Lemmons MVH:[id number] DOB: November 10, 1955 DOA: 04/23/2015  [facility]  Admit date: 04/23/2015 Discharge date: 04/30/2015  Time spent: 25 minutes  Recommendations for Outpatient Follow-up:  1. Follow up with PCP TO INCREASE THE CNA hours.   Discharge Diagnoses:  Principal Problem:   Hepatic encephalopathy (HCC) Active Problems:   Hepatitis C   Ascites   Essential hypertension, benign   Uncontrolled type 2 diabetes mellitus (HCC)   Decompensated hepatic cirrhosis (HCC)   AKI (acute kidney injury) (HCC)   GERD without esophagitis   Lactic acidosis   Hyponatremia   Palliative care encounter   Discharge Condition: improved  Diet recommendation: low salt diet.   Filed Weights   04/28/15 0556 04/29/15 0500 04/30/15 0511  Weight: 75.66 kg (166 lb 12.8 oz) 76.5 kg (168 lb 10.4 oz) 75.1 kg (165 lb 9.1 oz)    History of present illness:  59 y/o male with PMH of HTN, DM, Alcoholism, Hep C, Liver cirrhosis complicated with ascites, recurrent paracentesis, encephalopathy recently admitted (10/3-10/8), non compliance is readmitted with confusion, hepatic encephalopathy  Hospital Course:  . Acute encephalopathy. Hepatic encephalopathy with advanced liver cirrhosis. Ammonia is 98 on admission. Neuro exam is no focal. ammonia level improved.  -restarted lactulose, rifaximin. Mental status is improved. - he is probably non compliant to medications, fluids and diet .  - he is alert and oriented at this time.    2. Advanced liver cirrhosis, recurrent ascitis. Hypoalbuminemia, coagulopathy. Alcoholism. Hep C. - underwent US Guided paracentesis and removed 7.4 lit of fluid on 10/11 and 10/14 of 4.7liters,.  -. resume lasix and spironolactone and monitor renal function. .  - his fluid cultures have been negative so far,stopped rocephin today after 5 days.  - decreased the dose of lasix to 40 mg BID.   3. AKI. Cont gentle IVF. Resolved  with fluids. Stoped IV fluids and resumed lasix and spironolactone.  4. DM.  CBG (last 3)   Recent Labs (last 2 labs)      Recent Labs  04/29/15 0749 04/29/15 1141 04/29/15 1633  GLUCAP 159* 214* 153*     Resume metformin and SSI on discharge.    Anemia: Stable.   HYPOTENSION: Decreased the dose of lasix to 40 mg bid,  Resolved.   Hypomagnesemia: Replete as needed.  Repeat in amis normal.        Procedures:  US guided paracentesis.  Consultations:  Palliative care.   Discharge Exam: Filed Vitals:   04/30/15 0511  BP: 90/58  Pulse: 101  Temp: 98.4 F (36.9 C)  Resp: 18    General: alert , sitting on the bed and comfortable.  Cardiovascular: s1s2 Respiratory: ctab  Discharge Instructions   Discharge Instructions    Diet - low sodium heart healthy    Complete by:  As directed      Discharge instructions    Complete by:  As directed   [facility] of [facility]NA.          Discharge Medication List as of 04/30/2015  1:01 PM    CONTINUE these medications which have CHANGED   Details  furosemide (LASIX) 40 MG tablet Take 1 tablet (40 mg total) by mouth 2 (two) times daily., Starting 04/29/2015, Until Discontinued, Print    insulin detemir (LEVEMIR) 100 UNIT/ML injection Inject 0.1 mLs (10 Units total) into the skin at bedtime., Starting 04/29/2015, Until Discontinued, Print  CONTINUE these medications which have NOT CHANGED   Details  albuterol (PROVENTIL HFA;VENTOLIN HFA) 108 (90 BASE) MCG/ACT inhaler Inhale 2 puffs into the lungs every 2 (two) hours as needed for wheezing or shortness of breath (or coughing)., Starting 05/30/2014, Until Discontinued, Print    folic acid (FOLVITE) 1 MG tablet Take 1 tablet (1 mg total) by mouth daily., Starting 09/21/2014, Until Discontinued, Normal    gabapentin (NEURONTIN) 300 MG capsule Take 600 mg by mouth 3 (three) times daily., Until Discontinued, Historical Med     insulin aspart (NOVOLOG) 100 UNIT/ML injection Inject 0-6 Units into the skin 3 (three) times daily with meals. 0-6 Units, Subcutaneous, 3 times daily with meals  CBG < 70: implement hypoglycemia protocol; CBG 70 - 120: 0 units; CBG 121 - 150: 1 unit; CBG 151 - 200: 2 units; CBG 201 - 250: 3 units; C BG 251 - 300: 5 units; CBG 301 - 350: 7 units; CBG 351 - 400: 9 units; CBG > 400: call MD, Starting 03/10/2013, Until Discontinued, Historical Med    lactulose (CHRONULAC) 10 GM/15ML solution Take 15 mLs (10 g total) by mouth 2 (two) times daily., Starting 03/24/2015, Until Discontinued, Normal    Magnesium Oxide 420 MG TABS Take 1 tablet by mouth daily., Until Discontinued, Historical Med    metFORMIN (GLUCOPHAGE) 1000 MG tablet Take 1,000 mg by mouth 2 (two) times daily., Until Discontinued, Historical Med    Multiple Vitamin (MULTIVITAMIN WITH MINERALS) TABS tablet Take 1 tablet by mouth daily., Starting 09/21/2014, Until Discontinued, Normal    pantoprazole (PROTONIX) 40 MG tablet Take 40 mg by mouth 2 (two) times daily. , Until Discontinued, Historical Med    polyvinyl alcohol (LIQUIFILM TEARS) 1.4 % ophthalmic solution Place 1 drop into both eyes daily as needed (dry eyes). , Until Discontinued, Historical Med    propranolol (INDERAL) 10 MG tablet Take 1 tablet (10 mg total) by mouth 2 (two) times daily., Starting 02/19/2015, Until Discontinued, Print    rifaximin (XIFAXAN) 550 MG TABS tablet Take 1 tablet (550 mg total) by mouth 2 (two) times daily., Starting 03/24/2015, Until Discontinued, Normal    spironolactone (ALDACTONE) 25 MG tablet Take 25 mg by mouth daily., Until Discontinued, Historical Med    benzonatate (TESSALON) 200 MG capsule Take 1 capsule (200 mg total) by mouth 2 (two) times daily as needed for cough., Starting 04/21/2015, Until Discontinued, Normal       Allergies  Allergen Reactions  . Penicillins     Childhood Has received Rocephin w/o reaction   Follow-up  Information    Follow up with [facility]. Schedule an appointment as soon as possible for a visit in 1 week.   Specialty:  General Practice   Contact information:   [address] [place] [address] [phone]        The results of significant diagnostics from this hospitalization (including imaging, microbiology, ancillary and laboratory) are listed below for reference.    Significant Diagnostic Studies: Dg Chest 1 View  04/18/2015  CLINICAL DATA:  Cough and shortness of Breath EXAM: CHEST 1 VIEW COMPARISON:  April 16, 2015 FINDINGS: There is mild scarring in the bases. There is no edema or consolidation. The heart size and pulmonary vascularity are within normal limits. No adenopathy. No bone lesions. IMPRESSION: Stable scarring in the bases.  No edema or consolidation. Electronically Signed   By: Bretta Bang III M.D.   On: 04/18/2015 17:52   Dg Chest 2 View  04/03/2015  CLINICAL DATA:  Shortness of breath. EXAM: CHEST  2 VIEW COMPARISON:  March 21, 2015. FINDINGS: Hypoinflation of the lungs is noted. No pneumothorax or significant pleural effusion is noted. Stable bibasilar linear densities are noted most consistent with scarring or possibly atelectasis. Bony thorax is intact. IMPRESSION: Hypoinflation of the lungs with stable bibasilar opacities consistent with scarring or subsegmental atelectasis. Electronically Signed   By: Lupita Raider, M.D.   On: 04/03/2015 15:05   Nm Pulmonary Perf And Vent  04/18/2015  CLINICAL DATA:  Difficulty breathing EXAM: NUCLEAR MEDICINE VENTILATION - PERFUSION LUNG SCAN Views: Anterior, posterior, left lateral, right lateral, RPO, LPO, RAO, LAO- ventilation and perfusion Radionuclide: Technetium 29m DTPA-ventilation; Technetium 38m macroaggregated albumin-perfusion Dose:  40.5 mCi-ventilation; 6.2 mCi-perfusion Route of administration: Inhalation -ventilation ; intravenous-perfusion COMPARISON:  Chest radiograph April 16, 2015  FINDINGS: Ventilation: Radiotracer uptake is homogeneous and symmetric bilaterally without focal defect. Perfusion: Radiotracer uptake is homogeneous and symmetric bilaterally without focal defect. IMPRESSION: No ventilation or perfusion defects. This study constitutes a very low probability of pulmonary embolus. Electronically Signed   By: Bretta Bang III M.D.   On: 04/18/2015 17:47   US Paracentesis  04/27/2015  INDICATION: Hepatitis C, cirrhosis, recurrent ascites. Request is made for therapeutic paracentesis. EXAM: ULTRASOUND-GUIDED THERAPEUTIC PARACENTESIS COMPARISON:  Prior paracentesis on 04/24/2015 MEDICATIONS: None. COMPLICATIONS: None immediate TECHNIQUE: Informed written consent was obtained from the patient after a discussion of the risks, benefits and alternatives to treatment. A timeout was performed prior to the initiation of the procedure. Initial ultrasound scanning demonstrates a moderate to large amount of ascites within the left lower abdominal quadrant. The left lower abdomen was prepped and draped in the usual sterile fashion. 1% lidocaine was used for local anesthesia. Under direct ultrasound guidance, a 19 gauge, 10-cm, Yueh catheter was introduced. An ultrasound image was saved for documentation purposed. The paracentesis was performed. The catheter was removed and a dressing was applied. The patient tolerated the procedure well without immediate post procedural complication. FINDINGS: A total of approximately 4.7 liters of turbid, yellow fluid was removed. IMPRESSION: Successful ultrasound-guided therapeutic paracentesis yielding 4.7 liters of peritoneal fluid. Read by: Jeananne Rama, PA-C Electronically Signed   By: Irish Lack M.D.   On: 04/27/2015 12:28   US Paracentesis  04/26/2015  ADDENDUM REPORT: 04/26/2015 13:07 ADDENDUM: Please disregard this examination and report. They were created due to administrative error. Please refer to [phone] for the correct  examination and report Electronically Signed   By: Simonne Come M.D.   On: 04/26/2015 13:07  04/26/2015  INDICATION: Hepatitis-C, cirrhosis, recurrent ascites. Request is made for diagnostic and therapeutic paracentesis. EXAM: ULTRASOUND-GUIDED DIAGNOSTIC AND THERAPEUTIC PARACENTESIS COMPARISON:  Prior paracentesis on 04/19/2015 MEDICATIONS: None. COMPLICATIONS: None immediate TECHNIQUE: Informed written consent was obtained from the patient after a discussion of the risks, benefits and alternatives to treatment. A timeout was performed prior to the initiation of the procedure. Initial ultrasound scanning demonstrates a large amount of ascites within the right lower abdominal quadrant. The right lower abdomen was prepped and draped in the usual sterile fashion. 1% lidocaine was used for local anesthesia. Under direct ultrasound guidance, a 19 gauge, 10-cm, Yueh catheter was introduced. An ultrasound image was saved for documentation purposed. The paracentesis was performed. The catheter was removed and a dressing was applied. The patient tolerated the procedure well without immediate post procedural complication. FINDINGS: A total of approximately 7.4 liters of turbid, amber fluid was removed. Samples were sent to the laboratory as  requested by the clinical team. IMPRESSION: Successful ultrasound-guided diagnostic and therapeutic paracentesis yielding 7.4 liters of peritoneal fluid. Read by: Jeananne Rama, PA-C Electronically Signed: By: Simonne Come M.D. On: 04/24/2015 15:14   US Paracentesis  04/25/2015  INDICATION: Hepatitis-C, cirrhosis, recurrent ascites. Request is made for diagnostic and therapeutic paracentesis. EXAM: ULTRASOUND-GUIDED DIAGNOSTIC AND THERAPEUTIC PARACENTESIS COMPARISON:  Prior paracentesis on 04/19/2015 MEDICATIONS: None. COMPLICATIONS: None immediate TECHNIQUE: Informed written consent was obtained from the patient after a discussion of the risks, benefits and alternatives to treatment.  A timeout was performed prior to the initiation of the procedure. Initial ultrasound scanning demonstrates a large amount of ascites within the right lower abdominal quadrant. The right lower abdomen was prepped and draped in the usual sterile fashion. 1% lidocaine was used for local anesthesia. Under direct ultrasound guidance, a 19 gauge, 10-cm, Yueh catheter was introduced. An ultrasound image was saved for documentation purposed. The paracentesis was performed. The catheter was removed and a dressing was applied. The patient tolerated the procedure well without immediate post procedural complication. FINDINGS: A total of approximately 7.4 liters of turbid, amber fluid was removed. Samples were sent to the laboratory as requested by the clinical team. IMPRESSION: Successful ultrasound-guided diagnostic and therapeutic paracentesis yielding 7.4 liters of peritoneal fluid. Read by: Jeananne Rama, PA-C Electronically Signed   By: Simonne Come M.D.   On: 04/24/2015 15:14   US Paracentesis  04/19/2015  CLINICAL DATA:  Decompensated hepatic cirrhosis. Recurrent ascites. Request diagnostic and therapeutic paracentesis. EXAM: ULTRASOUND GUIDED PARACENTESIS COMPARISON:  Previous paracentesis 04/17/2015 PROCEDURE: An ultrasound guided paracentesis was thoroughly discussed with the patient and questions answered. The benefits, risks, alternatives and complications were also discussed. The patient understands and wishes to proceed with the procedure. Written consent was obtained. Ultrasound was performed to localize and mark an adequate pocket of fluid in the left lower quadrant of the abdomen. The area was then prepped and draped in the normal sterile fashion. 1% Lidocaine was used for local anesthesia. Under ultrasound guidance a 19 gauge Yueh catheter was introduced. Paracentesis was performed. The catheter was removed and a dressing applied. COMPLICATIONS: None immediate FINDINGS: A total of approximately 4.6 L of clear  yellow fluid was removed. A fluid sample was sent for laboratory analysis. IMPRESSION: Successful ultrasound guided paracentesis yielding 4.6 L of ascites. Read by: Brayton El PA-C Electronically Signed   By: Simonne Come M.D.   On: 04/19/2015 12:00   US Paracentesis  04/17/2015  INDICATION: Cirrhosis, hepatitis-C, recurrent ascites. Request is made for diagnostic and therapeutic paracentesis. EXAM: ULTRASOUND-GUIDED DIAGNOSTIC AND THERAPEUTIC PARACENTESIS COMPARISON:  Prior paracentesis on 04/05/2015 MEDICATIONS: None. COMPLICATIONS: None immediate TECHNIQUE: Informed written consent was obtained from the patient after a discussion of the risks, benefits and alternatives to treatment. A timeout was performed prior to the initiation of the procedure. Initial ultrasound scanning demonstrates a large amount of ascites within the left lower abdominal quadrant. The left lower abdomen was prepped and draped in the usual sterile fashion. 1% lidocaine was used for local anesthesia. Under direct ultrasound guidance, a 19 gauge, 10-cm, Yueh catheter was introduced. An ultrasound image was saved for documentation purposed. The paracentesis was performed. The catheter was removed and a dressing was applied. The patient tolerated the procedure well without immediate post procedural complication. FINDINGS: A total of approximately 9 liters of turbid, light yellow fluid was removed. Samples were sent to the laboratory as requested by the clinical team. IMPRESSION: Successful ultrasound-guided diagnostic and therapeutic paracentesis yielding 9  liters of peritoneal fluid. Read by: Jeananne Rama, PA-C Electronically Signed   By: Gilmer Mor D.O.   On: 04/17/2015 12:32   US Paracentesis  04/05/2015  INDICATION: Hepatitis C, cirrhosis, recurrent ascites and request for paracentesis. EXAM: ULTRASOUND-GUIDED PARACENTESIS COMPARISON:  Paracentesis 04/04/15. MEDICATIONS: None. COMPLICATIONS: None immediate TECHNIQUE: Informed  written consent was obtained from the patient after a discussion of the risks, benefits and alternatives to treatment. A timeout was performed prior to the initiation of the procedure. Initial ultrasound scanning demonstrates a large amount of ascites within the right lower abdominal quadrant. The right lower abdomen was prepped and draped in the usual sterile fashion. 1% lidocaine was used for local anesthesia. Under direct ultrasound guidance, a 19 gauge, 7-cm, Yueh catheter was introduced. An ultrasound image was saved for documentation purposed. The paracentesis was performed. The catheter was removed and a dressing was applied. The patient tolerated the procedure well without immediate post procedural complication. FINDINGS: A total of approximately 6.2 liters of cloudy yellow fluid was removed. IMPRESSION: Successful ultrasound-guided paracentesis yielding 6.2 liters of peritoneal fluid. Read By:  Pattricia Boss PA-C Electronically Signed   By: Gilmer Mor D.O.   On: 04/05/2015 17:27   US Paracentesis  04/04/2015  INDICATION: Hepatitis C, cirrhosis, recurrent ascites and request for paracentesis. EXAM: ULTRASOUND-GUIDED PARACENTESIS COMPARISON:  Paracentesis 03/23/2015. MEDICATIONS: None. COMPLICATIONS: None immediate TECHNIQUE: Informed written consent was obtained from the patient after a discussion of the risks, benefits and alternatives to treatment. A timeout was performed prior to the initiation of the procedure. Initial ultrasound scanning demonstrates a large amount of ascites within the left lower abdominal quadrant. The left lower abdomen was prepped and draped in the usual sterile fashion. 1% lidocaine was used for local anesthesia. Under direct ultrasound guidance, a 19 gauge, 7-cm, Yueh catheter was introduced. An ultrasound image was saved for documentation purposed. The paracentesis was performed. The catheter was removed and a dressing was applied. The patient tolerated the procedure well  without immediate post procedural complication. FINDINGS: A total of approximately 5 liters of cloudy yellow fluid was removed. Samples were sent to the laboratory as requested by the clinical team. IMPRESSION: Successful ultrasound-guided paracentesis yielding 5 liters of peritoneal fluid. Read By:  Pattricia Boss PA-C Electronically Signed   By: Simonne Come M.D.   On: 04/04/2015 14:44   Dg Chest Port 1 View  04/23/2015  CLINICAL DATA:  Acute confusion and pain. EXAM: PORTABLE CHEST 1 VIEW COMPARISON:  04/18/2015 and prior radiographs FINDINGS: This is a low volume film. Upper limits normal heart size noted. Pulmonary vascular congestion and mild interstitial opacities are identified- likely mild edema. There is no evidence of pneumothorax or definite pleural effusion. No acute bony abnormalities are identified. IMPRESSION: Pulmonary vascular congestion and question mild interstitial edema. Electronically Signed   By: Harmon Pier M.D.   On: 04/23/2015 19:34   Dg Chest Portable 1 View  04/16/2015  CLINICAL DATA:  Abdominal swelling. Paracentesis last week. Subsequent encounter. EXAM: PORTABLE CHEST 1 VIEW COMPARISON:  Chest radiographs 04/03/2015 and 03/21/2015. FINDINGS: 1849 hours. The degree of inspiration is improved compared with the prior study. There is improved linear atelectasis at both lung bases. No edema, confluent airspace opacity or significant pleural effusion identified. The heart size and mediastinal contours are stable. IMPRESSION: Interval improved basilar aeration and atelectasis. No acute findings. Electronically Signed   By: Carey Bullocks M.D.   On: 04/16/2015 19:14   Dg Abd Portable 1v  04/24/2015  CLINICAL  DATA:  Severe abdominal pain and distension intermittently for the past 46 months, history of hepatic cirrhosis and ascites, un controlled diabetes, hepatitis-C. EXAM: PORTABLE ABDOMEN - 1 VIEW COMPARISON:  Abdominal CT scan of February 05, 2015 FINDINGS: There is increased stool  burden throughout the right colon and transverse colon. There is no small or large bowel obstructive pattern. In the right upper quadrant adjacent to the medial aspect of the right eleventh rib there is a concentric ring contour that could reflect intussusception of small bowel en face, but again no obstructive pattern is observed. The bony structures are unremarkable. There are no abnormal soft tissue calcifications. IMPRESSION: There is no bowel obstruction or ileus. Increased right colonic and transverse colonic stool burden. This may reflect constipation in the appropriate clinical setting. Abdominal and pelvic CT scanning is recommended if the patient's clinical exam warrants further evaluation. Electronically Signed   By: David  [place] M.D.   On: 04/24/2015 09:50    Microbiology: Recent Results (from the past 240 hour(s))  Culture, blood (routine x 2)     Status: None   Collection Time: 04/23/15  7:30 PM  Result Value Ref Range Status   Specimen Description BLOOD LEFT ANTECUBITAL  Final   Special Requests BOTTLES DRAWN AEROBIC AND ANAEROBIC 5 CC   Final   Culture   Final    NO GROWTH 5 DAYS Performed at [facility]    Report Status 04/28/2015 FINAL  Final  Culture, blood (routine x 2)     Status: None   Collection Time: 04/23/15  7:40 PM  Result Value Ref Range Status   Specimen Description BLOOD RIGHT ARM  Final   Special Requests BOTTLES DRAWN AEROBIC ONLY 6 CC  Final   Culture   Final    NO GROWTH 5 DAYS Performed at [facility]    Report Status 04/28/2015 FINAL  Final  Culture, body fluid-bottle     Status: None   Collection Time: 04/23/15  8:15 PM  Result Value Ref Range Status   Specimen Description FLUID  Final   Special Requests NONE  Final   Culture   Final    NO GROWTH 5 DAYS Performed at [facility]    Report Status 04/29/2015 FINAL  Final  Urine culture     Status: None   Collection Time: 04/23/15  9:01 PM  Result Value Ref Range Status    Specimen Description URINE, CATHETERIZED  Final   Special Requests Normal  Final   Culture   Final    NO GROWTH 1 DAY Performed at [facility]    Report Status 04/25/2015 FINAL  Final  MRSA PCR Screening     Status: None   Collection Time: 04/23/15 10:23 PM  Result Value Ref Range Status   MRSA by PCR NEGATIVE NEGATIVE Final    Comment:        The GeneXpert MRSA Assay (FDA approved for NASAL specimens only), is one component of a comprehensive MRSA colonization surveillance program. It is not intended to diagnose MRSA infection nor to guide or monitor treatment for MRSA infections.   Culture, body fluid-bottle     Status: None   Collection Time: 04/24/15  2:23 PM  Result Value Ref Range Status   Specimen Description FLUID ASCITIC  Final   Special Requests NONE  Final   Culture NO GROWTH 5 DAYS  Final   Report Status 04/29/2015 FINAL  Final  Gram stain     Status:  None   Collection Time: 04/24/15  2:23 PM  Result Value Ref Range Status   Specimen Description FLUID ASCITIC  Final   Special Requests NONE  Final   Gram Stain   Final    FEW WBC PRESENT, PREDOMINANTLY MONONUCLEAR NO ORGANISMS SEEN    Report Status 04/24/2015 FINAL  Final     Labs: Basic Metabolic Panel:  Recent Labs Lab 04/24/15 1240 04/25/15 0345 04/26/15 1125 04/28/15 0608 04/30/15 0457  NA 132* 133* 135 132*  --   K 4.0 4.1 3.8 4.0  --   CL 99* 102 105 100*  --   CO2 25 22 25 24   --   GLUCOSE 260* 162* 176* 151*  --   BUN 19 14 9 12   --   CREATININE 1.49* 1.08 0.85 0.90  --   CALCIUM 8.8* 8.5* 8.3* 8.6*  --   MG  --   --   --  1.6* 1.8   Liver Function Tests: No results for input(s): AST, ALT, ALKPHOS, BILITOT, PROT, ALBUMIN in the last 168 hours. No results for input(s): LIPASE, AMYLASE in the last 168 hours.  Recent Labs Lab 04/25/15 0345  AMMONIA 51*   CBC:  Recent Labs Lab 04/25/15 0345  WBC 8.8  HGB 12.9*  HCT 38.1*  MCV 69.0*  PLT 251   Cardiac  Enzymes: No results for input(s): CKTOTAL, CKMB, CKMBINDEX, TROPONINI in the last 168 hours. BNP: BNP (last 3 results)  Recent Labs  02/12/15 0907 04/16/15 2101 04/23/15 1943  BNP 19.0 16.3 25.7    ProBNP (last 3 results) No results for input(s): PROBNP in the last 8760 hours.  CBG:  Recent Labs Lab 04/29/15 1141 04/29/15 1633 04/29/15 2044 04/30/15 0721 04/30/15 1209  GLUCAP 214* 153* 156* 150* 167*       Signed:  Shandy Vi  [facility] 05/01/2015, 9:34 AM

## 2015-05-21 ENCOUNTER — Emergency Department (HOSPITAL_COMMUNITY): Payer: Non-veteran care

## 2015-05-21 ENCOUNTER — Encounter (HOSPITAL_COMMUNITY): Payer: Self-pay | Admitting: Emergency Medicine

## 2015-05-21 ENCOUNTER — Emergency Department (HOSPITAL_COMMUNITY)
Admission: EM | Admit: 2015-05-21 | Discharge: 2015-05-21 | Disposition: A | Discharge status: Home / Self Care | Payer: Non-veteran care | Attending: Emergency Medicine | Admitting: Emergency Medicine

## 2015-05-21 DIAGNOSIS — Z794 Long term (current) use of insulin: Secondary | ICD-10-CM | POA: Diagnosis not present

## 2015-05-21 DIAGNOSIS — Z8619 Personal history of other infectious and parasitic diseases: Secondary | ICD-10-CM | POA: Insufficient documentation

## 2015-05-21 DIAGNOSIS — M19011 Primary osteoarthritis, right shoulder: Secondary | ICD-10-CM | POA: Diagnosis not present

## 2015-05-21 DIAGNOSIS — I10 Essential (primary) hypertension: Secondary | ICD-10-CM | POA: Diagnosis not present

## 2015-05-21 DIAGNOSIS — Z8701 Personal history of pneumonia (recurrent): Secondary | ICD-10-CM | POA: Insufficient documentation

## 2015-05-21 DIAGNOSIS — R188 Other ascites: Secondary | ICD-10-CM | POA: Diagnosis present

## 2015-05-21 DIAGNOSIS — R140 Abdominal distension (gaseous): Secondary | ICD-10-CM | POA: Diagnosis not present

## 2015-05-21 DIAGNOSIS — E119 Type 2 diabetes mellitus without complications: Secondary | ICD-10-CM | POA: Insufficient documentation

## 2015-05-21 DIAGNOSIS — Z8709 Personal history of other diseases of the respiratory system: Secondary | ICD-10-CM | POA: Diagnosis not present

## 2015-05-21 DIAGNOSIS — Z79899 Other long term (current) drug therapy: Secondary | ICD-10-CM | POA: Insufficient documentation

## 2015-05-21 DIAGNOSIS — D649 Anemia, unspecified: Secondary | ICD-10-CM | POA: Insufficient documentation

## 2015-05-21 DIAGNOSIS — Z720 Tobacco use: Secondary | ICD-10-CM | POA: Diagnosis not present

## 2015-05-21 DIAGNOSIS — K219 Gastro-esophageal reflux disease without esophagitis: Secondary | ICD-10-CM | POA: Diagnosis not present

## 2015-05-21 DIAGNOSIS — M19012 Primary osteoarthritis, left shoulder: Secondary | ICD-10-CM | POA: Diagnosis not present

## 2015-05-21 DIAGNOSIS — Z792 Long term (current) use of antibiotics: Secondary | ICD-10-CM | POA: Insufficient documentation

## 2015-05-21 DIAGNOSIS — Z880 Allergy status to penicillin: Secondary | ICD-10-CM | POA: Diagnosis not present

## 2015-05-21 LAB — COMPREHENSIVE METABOLIC PANEL
ALBUMIN: 2.3 g/dL — AB (ref 3.5–5.0)
ALK PHOS: 172 U/L — AB (ref 38–126)
ALT: 27 U/L (ref 17–63)
AST: 80 U/L — AB (ref 15–41)
Anion gap: 8 (ref 5–15)
BILIRUBIN TOTAL: 1.4 mg/dL — AB (ref 0.3–1.2)
BUN: 8 mg/dL (ref 6–20)
CALCIUM: 8.6 mg/dL — AB (ref 8.9–10.3)
CO2: 21 mmol/L — ABNORMAL LOW (ref 22–32)
Chloride: 105 mmol/L (ref 101–111)
Creatinine, Ser: 1.02 mg/dL (ref 0.61–1.24)
GFR calc Af Amer: >60 mL/min (ref >60)
GFR calc non Af Amer: >60 mL/min (ref >60)
GLUCOSE: 184 mg/dL — AB (ref 65–99)
Potassium: 4.6 mmol/L (ref 3.5–5.1)
Sodium: 134 mmol/L — ABNORMAL LOW (ref 135–145)
TOTAL PROTEIN: 6.7 g/dL (ref 6.5–8.1)

## 2015-05-21 LAB — CBC WITH DIFFERENTIAL/PLATELET
BASOS ABS: 0.2 10*3/uL — AB (ref 0.0–0.1)
Basophils Relative: 2 %
Eosinophils Absolute: 0.2 10*3/uL (ref 0.0–0.7)
Eosinophils Relative: 2 %
HCT: 41.3 % (ref 39.0–52.0)
HEMOGLOBIN: 13.6 g/dL (ref 13.0–17.0)
LYMPHS ABS: 1.8 10*3/uL (ref 0.7–4.0)
Lymphocytes Relative: 23 %
MCH: 23.9 pg — AB (ref 26.0–34.0)
MCHC: 32.9 g/dL (ref 30.0–36.0)
MCV: 72.5 fL — ABNORMAL LOW (ref 78.0–100.0)
Monocytes Absolute: 1.4 10*3/uL — ABNORMAL HIGH (ref 0.1–1.0)
Monocytes Relative: 18 %
NEUTROS ABS: 4.1 10*3/uL (ref 1.7–7.7)
Neutrophils Relative %: 55 %
PLATELETS: 256 10*3/uL (ref 150–400)
RBC: 5.7 MIL/uL (ref 4.22–5.81)
RDW: 21.4 % — ABNORMAL HIGH (ref 11.5–15.5)
WBC: 7.7 10*3/uL (ref 4.0–10.5)

## 2015-05-21 LAB — CBG MONITORING, ED: GLUCOSE-CAPILLARY: 108 mg/dL — AB (ref 65–99)

## 2015-05-21 MED ORDER — LIDOCAINE HCL (PF) 1 % IJ SOLN
INTRAMUSCULAR | Status: AC
  Filled 2015-05-21: qty 10

## 2015-05-21 MED ORDER — SODIUM CHLORIDE 0.9 % IV SOLN
INTRAVENOUS | Status: AC
  Administered 2015-05-21: 12:00:00 via INTRAVENOUS

## 2015-05-21 NOTE — ED Notes (Signed)
Going to ultrasound for paracentesis.

## 2015-05-21 NOTE — ED Notes (Signed)
Entered pts room with discharge papers. pts family at bedside wanting pt to get a prescription for pain medicines and for his scrotum to be looked at. Pt has not mentioned scrotum or back pain until discharge. MD made aware. MD at bedside.

## 2015-05-21 NOTE — ED Notes (Signed)
Pt back from US

## 2015-05-21 NOTE — ED Provider Notes (Signed)
Patient returned from paracentesis in stable condition.  Reported intermittent left groin swelling without pain.  Physical exam consistent with left hernia, compressible, no mass.  Patient and sister educated on strict return precautions and given follow up instruction with general surgery.  Discharged in stable condition.  Leta BaptistEmily Roe Billye Pickerel, MD 05/21/15 [phone]

## 2015-05-21 NOTE — ED Notes (Signed)
Pt in [facility]

## 2015-05-21 NOTE — ED Provider Notes (Signed)
CSN: [id number]     Arrival date & time 05/21/15  1005 History   First MD Initiated Contact with Patient 05/21/15 1014     Chief Complaint  Patient presents with  . Ascites     (Consider location/radiation/quality/duration/timing/severity/associated sxs/prior Treatment) HPI  Expand All Collapse All   To ED via [facility] with c/o ascites "need fluid drained off". Pt was seen at [facility] ago for same. "They took 5 Liters off one day and 5 the next" states [facility] have a nurse come out to the house and drain it 2 times a week" -- pt alert/oriented, w/d, abd grossly distended.          Past Medical History  Diagnosis Date  . Hypertension   . 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 ETOH abuse   . Ascites 01/2013  . Hematoma 01/2013    posterior right flank from a fall  . Coagulopathy (HCC) 01/2013    secondary to liver disease.   . Anemia 02/2013  . Hyponatremia 01/2013  . Unspecified constipation 04/04/2013  . H pylori ulcer 03/05/2013  . Gastric ulcer with hemorrhage 03/03/2013  . Chronic wound of extremity-right great toe 03/03/2013  . Acute blood loss anemia 03/03/2013  . Spontaneous bacterial peritonitis (HCC) 09/20/2014  . Hyperlipidemia   . Pneumonia ? 09/2014; 02/2015  . Chronic bronchitis (HCC)   . GERD (gastroesophageal reflux disease)   . Daily headache   . Arthritis     "shoulders" (03/21/2015)  . Type II diabetes mellitus (HCC)     uncontrolled  . Hepatitis C    Past Surgical History  Procedure Laterality Date  . Tonsillectomy    . Esophagogastroduodenoscopy N/A 03/03/2013    Procedure: ESOPHAGOGASTRODUODENOSCOPY (EGD);  Surgeon: Beverley Fiedler, MD;  Location[facility] ENDOSCOPY;  Service: Gastroenterology;  Laterality: N/A;  Bedside  . Inguinal hernia repair Right 1960's  . Paracentesis  09/2014; 02/2015; 03/21/2015   Family History  Problem Relation Age of Onset  . Dementia Mother   . Cancer - Other Father    Social History   Substance Use Topics  . Smoking status: Current Every Day Smoker -- 0.10 packs/day for 47 years    Types: Cigarettes  . Smokeless tobacco: Never Used  . Alcohol Use: Yes     Comment: 03/21/2015 "last drink was 02/15/2015)    Review of Systems  All other systems reviewed and are negative.     Allergies  Penicillins  Home Medications   Prior to Admission medications   Medication Sig Start Date End Date Taking? Authorizing Provider  furosemide (LASIX) 40 MG tablet Take 1 tablet (40 mg total) by mouth 2 (two) times daily. 04/29/15  Yes Kathlen Mody, MD  insulin aspart (NOVOLOG) 100 UNIT/ML injection Inject 10-20 Units into the skin 4 (four) times daily. 0-6 Units, Subcutaneous, 3 times daily with meals  CBG < 70: implement hypoglycemia protocol; CBG 70 - 120: 0 units; CBG 121 - 150: 1 unit; CBG 151 - 200: 2 units; CBG 201 - 250: 3 units; CBG 251 - 300: 5 units; CBG 301 - 350: 7 units; CBG 351 - 400: 9 units; CBG > 400: call MD 03/10/13  Yes Shanker Levora Dredge, MD  polyvinyl alcohol (LIQUIFILM TEARS) 1.4 % ophthalmic solution Place 1 drop into both eyes daily as needed (dry eyes).    Yes Historical Provider, MD  albuterol (PROVENTIL HFA;VENTOLIN HFA) 108 (  90 BASE) MCG/ACT inhaler Inhale 2 puffs into the lungs every 2 (two) hours as needed for wheezing or shortness of breath (or coughing). 05/30/14   Dione Boozeavid Glick, MD  benzonatate (TESSALON) 200 MG capsule Take 1 capsule (200 mg total) by mouth 2 (two) times daily as needed for cough. Patient not taking: Reported on 05/21/2015 04/21/15   Nita SellsMaryann Mikhail, DO  folic acid (FOLVITE) 1 MG tablet Take 1 tablet (1 mg total) by mouth daily. 09/21/14   Christina P Rama, MD  gabapentin (NEURONTIN) 300 MG capsule Take 600 mg by mouth 3 (three) times daily.    Historical Provider, MD  insulin detemir (LEVEMIR) 100 UNIT/ML injection Inject 0.1 mLs (10 Units total) into the skin at bedtime. 04/29/15   Kathlen ModyVijaya Akula, MD  lactulose (CHRONULAC) 10 GM/15ML solution Take  15 mLs (10 g total) by mouth 2 (two) times daily. 03/24/15   Maryann Mikhail, DO  Magnesium Oxide 420 MG TABS Take 1 tablet by mouth daily.    Historical Provider, MD  Multiple Vitamin (MULTIVITAMIN WITH MINERALS) TABS tablet Take 1 tablet by mouth daily. 09/21/14   Christina P Rama, MD  pantoprazole (PROTONIX) 40 MG tablet Take 40 mg by mouth 2 (two) times daily.     Historical Provider, MD  propranolol (INDERAL) 10 MG tablet Take 1 tablet (10 mg total) by mouth 2 (two) times daily. 02/19/15   Catarina Hartshornavid Tat, MD  rifaximin (XIFAXAN) 550 MG TABS tablet Take 1 tablet (550 mg total) by mouth 2 (two) times daily. 03/24/15   Maryann Mikhail, DO  spironolactone (ALDACTONE) 25 MG tablet Take 25 mg by mouth daily.    Historical Provider, MD   BP 93/59 mmHg  Pulse 118  Temp(Src) 98.1 F (36.7 C) (Oral)  Resp 15  SpO2 95% Physical Exam  Constitutional: He is oriented to person, place, and time. He appears well-developed and well-nourished. No distress.  HENT:  Head: Normocephalic and atraumatic.  Eyes: Pupils are equal, round, and reactive to light.  Neck: Normal range of motion.  Cardiovascular: Normal rate and intact distal pulses.   Pulmonary/Chest: No respiratory distress.  Abdominal: Normal appearance. He exhibits distension and ascites. There is no tenderness. There is no rebound.  Musculoskeletal: Normal range of motion.  Neurological: He is alert and oriented to person, place, and time. No cranial nerve deficit.  Skin: Skin is warm and dry. No rash noted.  Psychiatric: He has a normal mood and affect. His behavior is normal.  Nursing note and vitals reviewed.   ED Course  Procedures (including critical care time) Labs Review Labs Reviewed  COMPREHENSIVE METABOLIC PANEL - Abnormal; Notable for the following:    Sodium 134 (*)    CO2 21 (*)    Glucose, Bld 184 (*)    Calcium 8.6 (*)    Albumin 2.3 (*)    AST 80 (*)    Alkaline Phosphatase 172 (*)    Total Bilirubin 1.4 (*)    All other  components within normal limits  CBC WITH DIFFERENTIAL/PLATELET - Abnormal; Notable for the following:    MCV 72.5 (*)    MCH 23.9 (*)    RDW 21.4 (*)    Monocytes Absolute 1.4 (*)    Basophils Absolute 0.2 (*)    All other components within normal limits  CBG MONITORING, ED - Abnormal; Notable for the following:    Glucose-Capillary 108 (*)    All other components within normal limits    Imaging Review [place] Paracentesis  05/21/2015  INDICATION: Cirrhosis Recurrent ascites EXAM: ULTRASOUND-GUIDED PARACENTESIS COMPARISON:  Previous paracentesis MEDICATIONS: 10 cc 1% lidocaine COMPLICATIONS: None immediate TECHNIQUE: Informed written consent was obtained from the patient after a discussion of the risks, benefits and alternatives to treatment. A timeout was performed prior to the initiation of the procedure. Initial ultrasound scanning demonstrates a large amount of ascites within the right lower abdominal quadrant. The right lower abdomen was prepped and draped in the usual sterile fashion. 1% lidocaine with epinephrine was used for local anesthesia. Under direct ultrasound guidance, a 19 gauge, 7-cm, Yueh catheter was introduced. An ultrasound image was saved for documentation purposed. The paracentesis was performed. The catheter was removed and a dressing was applied. The patient tolerated the procedure well without immediate post procedural complication. FINDINGS: A total of approximately 8 liters of turbid fluid was removed. IMPRESSION: Successful ultrasound-guided paracentesis yielding 8 liters of peritoneal fluid. Maximum per MD. Read by:  Robet Leu [facility] Electronically Signed   By: Gilmer Mor D.O.   On: 05/21/2015 16:00   Dg Chest Portable 1 View  05/21/2015  CLINICAL DATA:  59 year old male with distended abdomen. Cirrhosis, recurrent ascites. Subsequent encounter. EXAM: PORTABLE CHEST 1 VIEW COMPARISON:  04/23/2015 and earlier. FINDINGS: Portable AP view at 1030 hours. Continued low  lung volumes but improved ventilation. Mild scarring or atelectasis in the left lung, otherwise Allowing for portable technique, the lungs are clear. No pneumothorax or pneumoperitoneum. Normal cardiac size and mediastinal contours. Dense gray opacity in the upper abdomen consistent with ascites in this setting. IMPRESSION: No acute cardiopulmonary abnormality.  Ascites. Electronically Signed   By: Odessa Fleming M.D.   On: 05/21/2015 11:06   I have personally reviewed and evaluated these images and lab results as part of my medical decision-making.   EKG Interpretation   Date/Time:  Monday May 21 2015 10:14:47 EST Ventricular Rate:  125 PR Interval:  127 QRS Duration: 99 QT Interval:  332 QTC Calculation: 479 R Axis:   35 Text Interpretation:  Sinus tachycardia Inferior infarct, age  indeterminate Lateral leads are also involved Artifact in lead(s) I II aVR  aVL aVF and baseline wander in lead(s) V3 V5 Confirmed by Maliik Karner  MD,  Libero Puthoff ([id number]) on 05/21/2015 12:30:17 PM     patient had 8 L of intraperitoneal fluid drained per interventional radiology.  Patient was discharged and has an appointment tomorrow to see GI doctor at the [place].   MDM   Final diagnoses:  Ascites        Nelva Nay, MD 05/23/15 [phone]

## 2015-05-21 NOTE — Discharge Instructions (Signed)
Ascites Ascites is a collection of excess fluid in the abdomen. Ascites can range from mild to severe. It can get worse without treatment. CAUSES Possible causes include:  Cirrhosis. This is the most common cause of ascites.  Infection or inflammation in the abdomen.  Cancer in the abdomen.  Heart failure.  Kidney disease.  Inflammation of the pancreas.  Clots in the veins of the liver. SIGNS AND SYMPTOMS Signs and symptoms may include:  A feeling of fullness in your abdomen. This is common.  An increase in the size of your abdomen or your waist.  Swelling in your legs.  Swelling of the scrotum in men.  Difficulty breathing.  Abdominal pain.  Sudden weight gain. If the condition is mild, you may not have symptoms. DIAGNOSIS To make a diagnosis, your health care provider will:  Ask about your medical history.  Perform a physical exam.  Order imaging tests, such as an ultrasound or CT scan of your abdomen. TREATMENT Treatment depends on the cause of the ascites. It may include:  Taking a pill to make you urinate. This is called a water pill (diuretic pill).  Strictly reducing your salt (sodium) intake. Salt can cause extra fluid to be kept in the body, and this makes ascites worse.  Having a procedure to remove fluid from your abdomen (paracentesis).  Having a procedure to transfer fluid from your abdomen into a vein.  Having a procedure that connects two of the major veins within your liver and relieves pressure on your liver (TIPS procedure). Ascites may go away or improve with treatment of the condition that caused it.  HOME CARE INSTRUCTIONS  Keep track of your weight. To do this, weigh yourself at the same time every day and record your weight.  Keep track of how much you drink and any changes in the amount you urinate.  Follow any instructions that your health care provider gives you about how much to drink.  Try not to eat salty (high-sodium)  foods.  Take medicines only as directed by your health care provider.  Keep all follow-up visits as directed by your health care provider. This is important.  Report any changes in your health to your health care provider, especially if you develop new symptoms or your symptoms get worse. SEEK MEDICAL CARE IF:  Your gain more than 3 pounds in 3 days.  Your abdominal size or your waist size increases.  You have new swelling in your legs.  The swelling in your legs gets worse. SEEK IMMEDIATE MEDICAL CARE IF:  You develop a fever.  You develop confusion.  You develop new or worsening difficulty breathing.  You develop new or worsening abdominal pain.  You develop new or worsening swelling in the scrotum (in men).   This information is not intended to replace advice given to you by your health care provider. Make sure you discuss any questions you have with your health care provider.   Document Released: 06/30/2005 Document Revised: 07/21/2014 Document Reviewed: 01/27/2014 Elsevier Interactive Patient Education 2016 Elsevier Inc.   Paracentesis  You were seen today for the fluid in your abdomen and underwent this procedure for fluid removal.  Follow up outpatient for repeat examination.  Paracentesis is a procedure to remove excess fluid (ascites) from the belly (abdomen). Ascites can result from certain conditions, such as infection, inflammation, abdominal injury, heart failure, chronic scarring of the liver (cirrhosis), or cancer. Ascites is removed using a needle that is inserted through the skin and  tissue into the abdomen. This procedure may be done:  To determine the cause of the ascites.  To relieve symptoms that are caused by the ascites, such as pain or shortness of breath.  To see if there is bleeding after an abdominal injury. LET [facility] CARE PROVIDER KNOW ABOUT:  Any allergies you have.  All medicines you are taking, including vitamins, herbs, eye  drops, creams, and over-the-counter medicines.  Previous problems you or members of your family have had with the use of anesthetics.  Any blood disorders you have.  Previous surgeries you have had.  Any medical conditions you have.  Whether you are pregnant or may be pregnant. RISKS AND COMPLICATIONS Generally, this is a safe procedure. However, problems may occur, including:  Infection.  Bleeding.  Injury to an abdominal organ, such as the bowel (large intestine), liver, spleen, or bladder.  Low blood pressure (hypotension).  Spreading of cancer, if there are cancer cells in the abdominal fluid.  Mental status changes in people who have liver disease. These changes would be caused by shifts in the balance of fluids and minerals (electrolytes) in the body. BEFORE THE PROCEDURE  Ask your health care provider about:  Changing or stopping your regular medicines. This is especially important if you are taking diabetes medicines or blood thinners.  Taking medicines such as aspirin and ibuprofen. These medicines can thin your blood. Do not take these medicines before your procedure if your health care provider instructs you not to.  A blood sample may be done to determine your blood clotting time.  You will be asked to urinate. PROCEDURE  You may be asked to lie on your back with your head raised (elevated).  To reduce your risk of infection:  Your health care team will wash or sanitize their hands.  Your skin will be washed with soap.  You will be given a medicine to numb the area (local anesthetic).  Your abdominal skin will be punctured with a needle or a scalpel.  A drainage tube will be inserted through the puncture site. Fluid will drain through the tube into a container.  After enough fluid has been removed, the tube will be removed.  A sample of the fluid will be sent for examination.  A bandage (dressing) will be placed over the puncture site. The  procedure may vary among health care providers and hospitals. AFTER THE PROCEDURE  It is your responsibility to get your test results. Ask your health care provider or the department performing the test when your results will be ready.   This information is not intended to replace advice given to you by your health care provider. Make sure you discuss any questions you have with your health care provider.   Document Released: 01/13/2005 Document Revised: 03/21/2015 Document Reviewed: 09/12/2014 Elsevier Interactive Patient Education 2016 Elsevier Inc.  Hernia, Adult A hernia is the bulging of an organ or tissue through a weak spot in the muscles of the abdomen (abdominal wall). Hernias develop most often near the navel or groin. There are many kinds of hernias. Common kinds include:  Femoral hernia. This kind of hernia develops under the groin in the upper thigh area.  Inguinal hernia. This kind of hernia develops in the groin or scrotum.  Umbilical hernia. This kind of hernia develops near the navel.  Hiatal hernia. This kind of hernia causes part of the stomach to be pushed up into the chest.  Incisional hernia. This kind of hernia bulges through  a scar from an abdominal surgery. CAUSES This condition may be caused by:  Heavy lifting.  Coughing over a long period of time.  Straining to have a bowel movement.  An incision made during an abdominal surgery.  A birth defect (congenital defect).  Excess weight or obesity.  Smoking.  Poor nutrition.  Cystic fibrosis.  Excess fluid in the abdomen.  Undescended testicles. SYMPTOMS Symptoms of a hernia include:  A lump on the abdomen. This is the first sign of a hernia. The lump may become more obvious with standing, straining, or coughing. It may get bigger over time if it is not treated or if the condition causing it is not treated.  Pain. A hernia is usually painless, but it may become painful over time if treatment  is delayed. The pain is usually dull and may get worse with standing or lifting heavy objects. Sometimes a hernia gets tightly squeezed in the weak spot (strangulated) or stuck there (incarcerated) and causes additional symptoms. These symptoms may include:  Vomiting.  Nausea.  Constipation.  Irritability. DIAGNOSIS A hernia may be diagnosed with:  A physical exam. During the exam your health care provider may ask you to cough or to make a specific movement, because a hernia is usually more visible when you move.  Imaging tests. These can include:  X-rays.  Ultrasound.  CT scan. TREATMENT A hernia that is small and painless may not need to be treated. A hernia that is large or painful may be treated with surgery. Inguinal hernias may be treated with surgery to prevent incarceration or strangulation. Strangulated hernias are always treated with surgery, because lack of blood to the trapped organ or tissue can cause it to die. Surgery to treat a hernia involves pushing the bulge back into place and repairing the weak part of the abdomen. HOME CARE INSTRUCTIONS  Avoid straining.  Do not lift anything heavier than 10 lb (4.5 kg).  Lift with your leg muscles, not your back muscles. This helps avoid strain.  When coughing, try to cough gently.  Prevent constipation. Constipation leads to straining with bowel movements, which can make a hernia worse or cause a hernia repair to break down. You can prevent constipation by:  Eating a high-fiber diet that includes plenty of fruits and vegetables.  Drinking enough fluids to keep your urine clear or pale yellow. Aim to drink 6-8 glasses of water per day.  Using a stool softener as directed by your health care provider.  Lose weight, if you are overweight.  Do not use any tobacco products, including cigarettes, chewing tobacco, or electronic cigarettes. If you need help quitting, ask your health care provider.  Keep all follow-up  visits as directed by your health care provider. This is important. Your health care provider may need to monitor your condition. SEEK MEDICAL CARE IF:  You have swelling, redness, and pain in the affected area.  Your bowel habits change. SEEK IMMEDIATE MEDICAL CARE IF:  You have a fever.  You have abdominal pain that is getting worse.  You feel nauseous or you vomit.  You cannot push the hernia back in place by gently pressing on it while you are lying down.  The hernia:  Changes in shape or size.  Is stuck outside the abdomen.  Becomes discolored.  Feels hard or tender.   This information is not intended to replace advice given to you by your health care provider. Make sure you discuss any questions you have with your  health care provider.   Document Released: 06/30/2005 Document Revised: 07/21/2014 Document Reviewed: 05/10/2014 Elsevier Interactive Patient Education [employer]

## 2015-05-21 NOTE — ED Notes (Signed)
Patient undressed, in gown, on monitor, continuous pulse oximetry and blood pressure cuff 

## 2015-05-21 NOTE — ED Notes (Signed)
Patient was given a happy meal with a cup of ice and a drink per Cyndie ChimeNguyen, MD; visitor at bedside; wheelchair placed at bedside for discharge

## 2015-05-21 NOTE — ED Notes (Signed)
[facility]/o [facility] "need fluid drained off". [facility]. "They took 5 Liters off one day and 5 the next" states [facility] to have a nurse come out to the house and drain it 2 times a week" -- pt alert/oriented, w/d, abd grossly distended.  Pt did not take any meds this morning, and has not eaten.

## 2015-05-21 NOTE — Procedures (Signed)
   US guided RLQ paracentesis 8 liters maximum per MD  Tolerated well

## 2015-06-04 ENCOUNTER — Encounter (HOSPITAL_COMMUNITY): Payer: Self-pay | Admitting: *Deleted

## 2015-06-04 ENCOUNTER — Emergency Department (HOSPITAL_COMMUNITY)
Admission: EM | Admit: 2015-06-04 | Discharge: 2015-06-04 | Disposition: A | Discharge status: Home / Self Care | Payer: Non-veteran care | Attending: Emergency Medicine | Admitting: Emergency Medicine

## 2015-06-04 DIAGNOSIS — Z880 Allergy status to penicillin: Secondary | ICD-10-CM | POA: Insufficient documentation

## 2015-06-04 DIAGNOSIS — Z8619 Personal history of other infectious and parasitic diseases: Secondary | ICD-10-CM | POA: Insufficient documentation

## 2015-06-04 DIAGNOSIS — K7469 Other cirrhosis of liver: Secondary | ICD-10-CM | POA: Insufficient documentation

## 2015-06-04 DIAGNOSIS — E119 Type 2 diabetes mellitus without complications: Secondary | ICD-10-CM | POA: Diagnosis not present

## 2015-06-04 DIAGNOSIS — R188 Other ascites: Secondary | ICD-10-CM

## 2015-06-04 DIAGNOSIS — B1710 Acute hepatitis C without hepatic coma: Secondary | ICD-10-CM | POA: Diagnosis not present

## 2015-06-04 DIAGNOSIS — M19012 Primary osteoarthritis, left shoulder: Secondary | ICD-10-CM | POA: Insufficient documentation

## 2015-06-04 DIAGNOSIS — Z794 Long term (current) use of insulin: Secondary | ICD-10-CM | POA: Diagnosis not present

## 2015-06-04 DIAGNOSIS — Z79899 Other long term (current) drug therapy: Secondary | ICD-10-CM | POA: Insufficient documentation

## 2015-06-04 DIAGNOSIS — Z8709 Personal history of other diseases of the respiratory system: Secondary | ICD-10-CM | POA: Insufficient documentation

## 2015-06-04 DIAGNOSIS — M19011 Primary osteoarthritis, right shoulder: Secondary | ICD-10-CM | POA: Diagnosis not present

## 2015-06-04 DIAGNOSIS — R0600 Dyspnea, unspecified: Secondary | ICD-10-CM | POA: Diagnosis not present

## 2015-06-04 DIAGNOSIS — R05 Cough: Secondary | ICD-10-CM | POA: Insufficient documentation

## 2015-06-04 DIAGNOSIS — F17210 Nicotine dependence, cigarettes, uncomplicated: Secondary | ICD-10-CM | POA: Diagnosis not present

## 2015-06-04 DIAGNOSIS — Z792 Long term (current) use of antibiotics: Secondary | ICD-10-CM | POA: Diagnosis not present

## 2015-06-04 DIAGNOSIS — Z8701 Personal history of pneumonia (recurrent): Secondary | ICD-10-CM | POA: Insufficient documentation

## 2015-06-04 DIAGNOSIS — I10 Essential (primary) hypertension: Secondary | ICD-10-CM | POA: Diagnosis not present

## 2015-06-04 DIAGNOSIS — K219 Gastro-esophageal reflux disease without esophagitis: Secondary | ICD-10-CM | POA: Insufficient documentation

## 2015-06-04 DIAGNOSIS — B1920 Unspecified viral hepatitis C without hepatic coma: Secondary | ICD-10-CM

## 2015-06-04 DIAGNOSIS — D649 Anemia, unspecified: Secondary | ICD-10-CM | POA: Diagnosis not present

## 2015-06-04 MED ORDER — LIDOCAINE HCL (PF) 1 % IJ SOLN
10.0000 mL | Freq: Once | INTRAMUSCULAR | Status: AC
  Administered 2015-06-04: 10 mL via INTRADERMAL
  Filled 2015-06-04: qty 10

## 2015-06-04 NOTE — ED Provider Notes (Signed)
CSN: [id number]     Arrival date & time 06/04/15  [id number] History   First MD Initiated Contact with Patient 06/04/15 [phone]     Chief Complaint  Patient presents with  . Ascites  . Shortness of Breath     (Consider location/radiation/quality/duration/timing/severity/associated sxs/prior Treatment) Patient is a 59 y.o. male presenting with shortness of breath. The history is provided by the patient.  Shortness of Breath Severity:  Moderate Onset quality:  Gradual Duration:  2 weeks Timing:  Constant Progression:  Worsening Chronicity:  Recurrent Context comment:  Reaccumulated peritoneal ascites Relieved by: Paracentesis 2 weeks ago. Worsened by:  Nothing tried Ineffective treatments:  None tried Associated symptoms: cough and PND   Associated symptoms: no abdominal pain, no fever and no wheezing   Risk factors comment:  Chronic liver disease and recurrent ascites   Past Medical History  Diagnosis Date  . Hypertension   . 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 ETOH abuse   . Ascites 01/2013  . Hematoma 01/2013    posterior right flank from a fall  . Coagulopathy (HCC) 01/2013    secondary to liver disease.   . Anemia 02/2013  . Hyponatremia 01/2013  . Unspecified constipation 04/04/2013  . H pylori ulcer 03/05/2013  . Gastric ulcer with hemorrhage 03/03/2013  . Chronic wound of extremity-right great toe 03/03/2013  . Acute blood loss anemia 03/03/2013  . Spontaneous bacterial peritonitis (HCC) 09/20/2014  . Hyperlipidemia   . Pneumonia ? 09/2014; 02/2015  . Chronic bronchitis (HCC)   . GERD (gastroesophageal reflux disease)   . Daily headache   . Arthritis     "shoulders" (03/21/2015)  . Type II diabetes mellitus (HCC)     uncontrolled  . Hepatitis C    Past Surgical History  Procedure Laterality Date  . Tonsillectomy    . Esophagogastroduodenoscopy N/A 03/03/2013    Procedure: ESOPHAGOGASTRODUODENOSCOPY (EGD);  Surgeon: Beverley FiedlerJay M Pyrtle, MD;  [facility];   Service: Gastroenterology;  Laterality: N/A;  Bedside  . Inguinal hernia repair Right 1960's  . Paracentesis  09/2014; 02/2015; 03/21/2015   Family History  Problem Relation Age of Onset  . Dementia Mother   . Cancer - Other Father    Social History  Substance Use Topics  . Smoking status: Current Every Day Smoker -- 0.10 packs/day for 47 years    Types: Cigarettes  . Smokeless tobacco: Never Used  . Alcohol Use: Yes     Comment: 03/21/2015 "last drink was 02/15/2015)    Review of Systems  Constitutional: Negative for fever.  Respiratory: Positive for cough and shortness of breath. Negative for wheezing.   Cardiovascular: Positive for PND.  Gastrointestinal: Negative for abdominal pain.  All other systems reviewed and are negative.     Allergies  Penicillins  Home Medications   Prior to Admission medications   Medication Sig Start Date End Date Taking? Authorizing Provider  albuterol (PROVENTIL HFA;VENTOLIN HFA) 108 (90 BASE) MCG/ACT inhaler Inhale 2 puffs into the lungs every 2 (two) hours as needed for wheezing or shortness of breath (or coughing). 05/30/14   Dione Boozeavid Glick, MD  benzonatate (TESSALON) 200 MG capsule Take 1 capsule (200 mg total) by mouth 2 (two) times daily as needed for cough. Patient not taking: Reported on 05/21/2015 04/21/15   Nita SellsMaryann Mikhail, DO  folic acid (FOLVITE) 1 MG tablet Take 1 tablet (1 mg total) by mouth daily. 09/21/14   Maryruth Bunhristina P Rama, MD  furosemide (LASIX) 40  MG tablet Take 1 tablet (40 mg total) by mouth 2 (two) times daily. 04/29/15   Kathlen Mody, MD  gabapentin (NEURONTIN) 300 MG capsule Take 600 mg by mouth 3 (three) times daily.    Historical Provider, MD  insulin aspart (NOVOLOG) 100 UNIT/ML injection Inject 10-20 Units into the skin 4 (four) times daily. 0-6 Units, Subcutaneous, 3 times daily with meals  CBG < 70: implement hypoglycemia protocol; CBG 70 - 120: 0 units; CBG 121 - 150: 1 unit; CBG 151 - 200: 2 units; CBG 201 - 250: 3 units; CBG  251 - 300: 5 units; CBG 301 - 350: 7 units; CBG 351 - 400: 9 units; CBG > 400: call MD 03/10/13   Maretta Bees, MD  insulin detemir (LEVEMIR) 100 UNIT/ML injection Inject 0.1 mLs (10 Units total) into the skin at bedtime. 04/29/15   Kathlen Mody, MD  lactulose (CHRONULAC) 10 GM/15ML solution Take 15 mLs (10 g total) by mouth 2 (two) times daily. 03/24/15   Maryann Mikhail, DO  Magnesium Oxide 420 MG TABS Take 1 tablet by mouth daily.    Historical Provider, MD  Multiple Vitamin (MULTIVITAMIN WITH MINERALS) TABS tablet Take 1 tablet by mouth daily. 09/21/14   Christina P Rama, MD  pantoprazole (PROTONIX) 40 MG tablet Take 40 mg by mouth 2 (two) times daily.     Historical Provider, MD  polyvinyl alcohol (LIQUIFILM TEARS) 1.4 % ophthalmic solution Place 1 drop into both eyes daily as needed (dry eyes).     Historical Provider, MD  propranolol (INDERAL) 10 MG tablet Take 1 tablet (10 mg total) by mouth 2 (two) times daily. 02/19/15   Catarina Hartshorn, MD  rifaximin (XIFAXAN) 550 MG TABS tablet Take 1 tablet (550 mg total) by mouth 2 (two) times daily. 03/24/15   Maryann Mikhail, DO  spironolactone (ALDACTONE) 25 MG tablet Take 25 mg by mouth daily.    Historical Provider, MD   BP 111/78 mmHg  Pulse 100  Temp(Src) 98.1 F (36.7 C) (Oral)  Resp 23  Ht  (1.702 m)  Wt 203 lb 2 oz (92.137 kg)  BMI 31.81 kg/m2  SpO2 90% Physical Exam  Constitutional: He is oriented to person, place, and time. He appears well-developed and well-nourished. No distress.  HENT:  Head: Normocephalic and atraumatic.  Eyes: Conjunctivae are normal.  Neck: Neck supple. No tracheal deviation present.  Cardiovascular: Normal rate and regular rhythm.   Pulmonary/Chest: Effort normal. No respiratory distress.  Abdominal: He exhibits shifting dullness, distension and ascites. There is no tenderness.  Large firm abdomen  Neurological: He is alert and oriented to person, place, and time.  Skin: Skin is warm and dry.   Psychiatric: He has a normal mood and affect.  Vitals reviewed.   ED Course  .Paracentesis Date/Time: 06/04/2015 9:07 AM Performed by: Lyndal Pulley Authorized by: Lyndal Pulley Consent: Verbal consent obtained. Risks and benefits: risks, benefits and alternatives were discussed Consent given by: patient Patient understanding: patient states understanding of the procedure being performed Required items: required blood products, implants, devices, and special equipment available Patient identity confirmed: verbally with patient, arm band, provided demographic data and hospital-assigned identification number Time out: Immediately prior to procedure a "time out" was called to verify the correct patient, procedure, equipment, support staff and site/side marked as required. Initial or subsequent exam: subsequent Procedure purpose: therapeutic Indications: abdominal discomfort secondary to ascites and respiratory distress secondary to ascites Anesthesia: local infiltration Local anesthetic: lidocaine 1% without epinephrine Anesthetic total:  5 ml Patient sedated: no Preparation: Patient was prepped and draped in the usual sterile fashion. Needle gauge: 20 Ultrasound guidance: yes Puncture site: left lower quadrant Fluid removed: 4000(ml) Fluid appearance: chylous and clear Dressing: 4x4 sterile gauze Patient tolerance: Patient tolerated the procedure well with no immediate complications   (including critical care time) Labs Review Labs Reviewed - No data to display  Imaging Review No results found. I have personally reviewed and evaluated these images and lab results as part of my medical decision-making.   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 male presents with recurrent respiratory difficulty 2/2 re-accumulated ascites as well as abdominal distention. No fevers, no signs of spontaneous bacterial peritonitis, otherwise well-appearing. Requires serial taps and is pending drain [facility]. No indication for lab workup currently. He was last seen here 2 weeks ago where 8 L of fluid was pulled off by radiology. 4 L were pulled up today with improvement in the patient's distention and shortness of breath. No acute changes in blood pressure or indication for albumin administration. Plan to follow up with PCP as needed and return precautions discussed for worsening or new concerning symptoms.     Lyndal Pulley, MD 06/04/15 [phone]

## 2015-06-04 NOTE — ED Notes (Signed)
MD at bedside. 

## 2015-06-04 NOTE — ED Notes (Signed)
MD Clydene PughKnott at bedside preparing for paracentsis, pt a x 4, NAD, VSS

## 2015-06-04 NOTE — ED Notes (Signed)
Pt tolerating procedure well at this time.

## 2015-06-04 NOTE — ED Notes (Signed)
Paracentesis completed at this time, pt tolerated well.

## 2015-06-04 NOTE — Discharge Instructions (Signed)
Ascites °Ascites is a collection of excess fluid in the abdomen. Ascites can range from mild to severe. It can get worse without treatment. °CAUSES °Possible causes include: °· Cirrhosis. This is the most common cause of ascites. °· Infection or inflammation in the abdomen. °· Cancer in the abdomen. °· Heart failure. °· Kidney disease. °· Inflammation of the pancreas. °· Clots in the veins of the liver. °SIGNS AND SYMPTOMS °Signs and symptoms may include: °· A feeling of fullness in your abdomen. This is common. °· An increase in the size of your abdomen or your waist. °· Swelling in your legs. °· Swelling of the scrotum in men. °· Difficulty breathing. °· Abdominal pain. °· Sudden weight gain. °If the condition is mild, you may not have symptoms. °DIAGNOSIS °To make a diagnosis, your health care provider will: °· Ask about your medical history. °· Perform a physical exam. °· Order imaging tests, such as an ultrasound or CT scan of your abdomen. °TREATMENT °Treatment depends on the cause of the ascites. It may include: °· Taking a pill to make you urinate. This is called a water pill (diuretic pill). °· Strictly reducing your salt (sodium) intake. Salt can cause extra fluid to be kept in the body, and this makes ascites worse. °· Having a procedure to remove fluid from your abdomen (paracentesis). °· Having a procedure to transfer fluid from your abdomen into a vein. °· Having a procedure that connects two of the major veins within your liver and relieves pressure on your liver (TIPS procedure). °Ascites may go away or improve with treatment of the condition that caused it.  °HOME CARE INSTRUCTIONS °· Keep track of your weight. To do this, weigh yourself at the same time every day and record your weight. °· Keep track of how much you drink and any changes in the amount you urinate. °· Follow any instructions that your health care provider gives you about how much to drink. °· Try not to eat salty (high-sodium)  foods. °· Take medicines only as directed by your health care provider. °· Keep all follow-up visits as directed by your health care provider. This is important. °· Report any changes in your health to your health care provider, especially if you develop new symptoms or your symptoms get worse. °SEEK MEDICAL CARE IF: °· Your gain more than 3 pounds in 3 days. °· Your abdominal size or your waist size increases. °· You have new swelling in your legs. °· The swelling in your legs gets worse. °SEEK IMMEDIATE MEDICAL CARE IF: °· You develop a fever. °· You develop confusion. °· You develop new or worsening difficulty breathing. °· You develop new or worsening abdominal pain. °· You develop new or worsening swelling in the scrotum (in men). °  °This information is not intended to replace advice given to you by your health care provider. Make sure you discuss any questions you have with your health care provider. °  °Document Released: 06/30/2005 Document Revised: 07/21/2014 Document Reviewed: 01/27/2014 °Elsevier Interactive Patient Education ©2016 [employer] ° °

## 2015-06-04 NOTE — ED Notes (Signed)
Pt presents via POV c/o of ascites and increasing SOB.  Pt reports liver cirrhosis and having paracentesis in the past.  Pt a x 4, NAD.  Abdomen very distended and taut.

## 2015-06-22 ENCOUNTER — Inpatient Hospital Stay (HOSPITAL_COMMUNITY): Payer: Non-veteran care

## 2015-06-22 ENCOUNTER — Encounter (HOSPITAL_COMMUNITY): Payer: Self-pay | Admitting: Emergency Medicine

## 2015-06-22 ENCOUNTER — Inpatient Hospital Stay (HOSPITAL_COMMUNITY)
Admission: EM | Admit: 2015-06-22 | Discharge: 2015-06-26 | DRG: 432 | Disposition: A | Discharge status: Home Health | Payer: Non-veteran care | Attending: Internal Medicine | Admitting: Internal Medicine

## 2015-06-22 ENCOUNTER — Emergency Department (HOSPITAL_COMMUNITY): Payer: Non-veteran care

## 2015-06-22 DIAGNOSIS — M13811 Other specified arthritis, right shoulder: Secondary | ICD-10-CM | POA: Diagnosis present

## 2015-06-22 DIAGNOSIS — Z79899 Other long term (current) drug therapy: Secondary | ICD-10-CM | POA: Diagnosis not present

## 2015-06-22 DIAGNOSIS — E118 Type 2 diabetes mellitus with unspecified complications: Secondary | ICD-10-CM | POA: Diagnosis present

## 2015-06-22 DIAGNOSIS — M13812 Other specified arthritis, left shoulder: Secondary | ICD-10-CM | POA: Diagnosis present

## 2015-06-22 DIAGNOSIS — D689 Coagulation defect, unspecified: Secondary | ICD-10-CM | POA: Diagnosis present

## 2015-06-22 DIAGNOSIS — I5032 Chronic diastolic (congestive) heart failure: Secondary | ICD-10-CM | POA: Diagnosis present

## 2015-06-22 DIAGNOSIS — F1010 Alcohol abuse, uncomplicated: Secondary | ICD-10-CM | POA: Diagnosis present

## 2015-06-22 DIAGNOSIS — K7290 Hepatic failure, unspecified without coma: Secondary | ICD-10-CM | POA: Diagnosis present

## 2015-06-22 DIAGNOSIS — R188 Other ascites: Secondary | ICD-10-CM | POA: Diagnosis present

## 2015-06-22 DIAGNOSIS — N179 Acute kidney failure, unspecified: Secondary | ICD-10-CM | POA: Diagnosis present

## 2015-06-22 DIAGNOSIS — F17210 Nicotine dependence, cigarettes, uncomplicated: Secondary | ICD-10-CM | POA: Diagnosis present

## 2015-06-22 DIAGNOSIS — K767 Hepatorenal syndrome: Secondary | ICD-10-CM | POA: Diagnosis present

## 2015-06-22 DIAGNOSIS — B182 Chronic viral hepatitis C: Secondary | ICD-10-CM | POA: Diagnosis present

## 2015-06-22 DIAGNOSIS — R600 Localized edema: Secondary | ICD-10-CM | POA: Diagnosis present

## 2015-06-22 DIAGNOSIS — K7031 Alcoholic cirrhosis of liver with ascites: Secondary | ICD-10-CM | POA: Diagnosis present

## 2015-06-22 DIAGNOSIS — E119 Type 2 diabetes mellitus without complications: Secondary | ICD-10-CM | POA: Diagnosis present

## 2015-06-22 DIAGNOSIS — R0600 Dyspnea, unspecified: Secondary | ICD-10-CM | POA: Diagnosis present

## 2015-06-22 DIAGNOSIS — I110 Hypertensive heart disease with heart failure: Secondary | ICD-10-CM | POA: Diagnosis present

## 2015-06-22 DIAGNOSIS — Z880 Allergy status to penicillin: Secondary | ICD-10-CM

## 2015-06-22 DIAGNOSIS — Z794 Long term (current) use of insulin: Secondary | ICD-10-CM

## 2015-06-22 DIAGNOSIS — K219 Gastro-esophageal reflux disease without esophagitis: Secondary | ICD-10-CM | POA: Diagnosis present

## 2015-06-22 DIAGNOSIS — J209 Acute bronchitis, unspecified: Secondary | ICD-10-CM | POA: Diagnosis not present

## 2015-06-22 DIAGNOSIS — E785 Hyperlipidemia, unspecified: Secondary | ICD-10-CM | POA: Diagnosis present

## 2015-06-22 DIAGNOSIS — I959 Hypotension, unspecified: Secondary | ICD-10-CM | POA: Diagnosis present

## 2015-06-22 LAB — COMPREHENSIVE METABOLIC PANEL
ALK PHOS: 145 U/L — AB (ref 38–126)
ALT: 24 U/L (ref 17–63)
ANION GAP: 9 (ref 5–15)
AST: 72 U/L — ABNORMAL HIGH (ref 15–41)
Albumin: 1.9 g/dL — ABNORMAL LOW (ref 3.5–5.0)
BILIRUBIN TOTAL: 1 mg/dL (ref 0.3–1.2)
BUN: 22 mg/dL — AB (ref 6–20)
CALCIUM: 8.7 mg/dL — AB (ref 8.9–10.3)
CO2: 22 mmol/L (ref 22–32)
Chloride: 105 mmol/L (ref 101–111)
Creatinine, Ser: 2.73 mg/dL — ABNORMAL HIGH (ref 0.61–1.24)
GFR calc Af Amer: 27 mL/min — ABNORMAL LOW (ref >60)
GFR, EST NON AFRICAN AMERICAN: 24 mL/min — AB (ref >60)
GLUCOSE: 160 mg/dL — AB (ref 65–99)
POTASSIUM: 4.6 mmol/L (ref 3.5–5.1)
Sodium: 136 mmol/L (ref 135–145)
TOTAL PROTEIN: 5.8 g/dL — AB (ref 6.5–8.1)

## 2015-06-22 LAB — CBC
HEMATOCRIT: 40.7 % (ref 39.0–52.0)
HEMOGLOBIN: 13.3 g/dL (ref 13.0–17.0)
MCH: 24.2 pg — AB (ref 26.0–34.0)
MCHC: 32.7 g/dL (ref 30.0–36.0)
MCV: 74.1 fL — AB (ref 78.0–100.0)
PLATELETS: 247 10*3/uL (ref 150–400)
RBC: 5.49 MIL/uL (ref 4.22–5.81)
RDW: 18.6 % — AB (ref 11.5–15.5)
WBC: 7.5 10*3/uL (ref 4.0–10.5)

## 2015-06-22 LAB — URINE MICROSCOPIC-ADD ON

## 2015-06-22 LAB — URINALYSIS, ROUTINE W REFLEX MICROSCOPIC
Glucose, UA: NEGATIVE mg/dL
Ketones, ur: 15 mg/dL — AB
Nitrite: NEGATIVE
PH: 5 (ref 5.0–8.0)
Protein, ur: 30 mg/dL — AB
SPECIFIC GRAVITY, URINE: 1.024 (ref 1.005–1.030)

## 2015-06-22 LAB — BRAIN NATRIURETIC PEPTIDE
B Natriuretic Peptide: 23 pg/mL (ref 0.0–100.0)
B Natriuretic Peptide: 25.2 pg/mL (ref 0.0–100.0)

## 2015-06-22 LAB — MAGNESIUM: MAGNESIUM: 2 mg/dL (ref 1.7–2.4)

## 2015-06-22 LAB — PROTIME-INR
INR: 1.43 (ref 0.00–1.49)
PROTHROMBIN TIME: 17.5 s — AB (ref 11.6–15.2)

## 2015-06-22 LAB — SODIUM, URINE, RANDOM

## 2015-06-22 LAB — CREATININE, URINE, RANDOM: CREATININE, URINE: 335.45 mg/dL

## 2015-06-22 LAB — GLUCOSE, CAPILLARY: Glucose-Capillary: 115 mg/dL — ABNORMAL HIGH (ref 65–99)

## 2015-06-22 LAB — LIPASE, BLOOD: Lipase: 38 U/L (ref 11–51)

## 2015-06-22 MED ORDER — SODIUM CHLORIDE 0.9 % IJ SOLN
3.0000 mL | Freq: Two times a day (BID) | INTRAMUSCULAR | Status: DC
  Administered 2015-06-22 – 2015-06-26 (×5): 3 mL via INTRAVENOUS

## 2015-06-22 MED ORDER — LACTULOSE 10 GM/15ML PO SOLN
10.0000 g | Freq: Two times a day (BID) | ORAL | Status: DC
  Administered 2015-06-22 – 2015-06-26 (×8): 10 g via ORAL
  Filled 2015-06-22 (×8): qty 15

## 2015-06-22 MED ORDER — ALUM & MAG HYDROXIDE-SIMETH 200-200-20 MG/5ML PO SUSP
30.0000 mL | Freq: Three times a day (TID) | ORAL | Status: DC | PRN
  Administered 2015-06-22: 30 mL via ORAL
  Filled 2015-06-22: qty 30

## 2015-06-22 MED ORDER — SULFAMETHOXAZOLE-TRIMETHOPRIM 800-160 MG PO TABS
1.0000 | ORAL_TABLET | Freq: Every day | ORAL | Status: DC
  Administered 2015-06-22: 1 via ORAL
  Filled 2015-06-22: qty 1

## 2015-06-22 MED ORDER — SPIRONOLACTONE 25 MG PO TABS: 25.0000 mg | ORAL_TABLET | Freq: Every day | ORAL | Status: DC

## 2015-06-22 MED ORDER — BENZONATATE 100 MG PO CAPS
200.0000 mg | ORAL_CAPSULE | Freq: Two times a day (BID) | ORAL | Status: DC | PRN
  Administered 2015-06-23 – 2015-06-25 (×3): 200 mg via ORAL
  Filled 2015-06-22 (×3): qty 2

## 2015-06-22 MED ORDER — PROPRANOLOL HCL 10 MG PO TABS
10.0000 mg | ORAL_TABLET | Freq: Two times a day (BID) | ORAL | Status: DC
  Administered 2015-06-22: 10 mg via ORAL
  Filled 2015-06-22 (×3): qty 1

## 2015-06-22 MED ORDER — RIFAXIMIN 550 MG PO TABS
550.0000 mg | ORAL_TABLET | Freq: Two times a day (BID) | ORAL | Status: DC
  Administered 2015-06-22 – 2015-06-26 (×8): 550 mg via ORAL
  Filled 2015-06-22 (×8): qty 1

## 2015-06-22 MED ORDER — ADULT MULTIVITAMIN W/MINERALS CH
1.0000 | ORAL_TABLET | Freq: Every day | ORAL | Status: DC
  Administered 2015-06-23 – 2015-06-26 (×4): 1 via ORAL
  Filled 2015-06-22 (×4): qty 1

## 2015-06-22 MED ORDER — INSULIN GLARGINE 100 UNIT/ML ~~LOC~~ SOLN
10.0000 [IU] | Freq: Every day | SUBCUTANEOUS | Status: DC
Start: 2015-06-22 — End: 2015-06-26
  Administered 2015-06-22 – 2015-06-25 (×4): 10 [IU] via SUBCUTANEOUS
  Filled 2015-06-22 (×5): qty 0.1

## 2015-06-22 MED ORDER — ALBUMIN HUMAN 25 % IV SOLN
50.0000 g | Freq: Once | INTRAVENOUS | Status: AC
  Administered 2015-06-23: 50 g via INTRAVENOUS
  Filled 2015-06-22 (×2): qty 200

## 2015-06-22 MED ORDER — HEPARIN SODIUM (PORCINE) 5000 UNIT/ML IJ SOLN
5000.0000 [IU] | Freq: Three times a day (TID) | INTRAMUSCULAR | Status: DC
  Administered 2015-06-22 – 2015-06-26 (×10): 5000 [IU] via SUBCUTANEOUS
  Filled 2015-06-22 (×10): qty 1

## 2015-06-22 MED ORDER — FOLIC ACID 1 MG PO TABS
1.0000 mg | ORAL_TABLET | Freq: Every day | ORAL | Status: DC
  Administered 2015-06-22 – 2015-06-26 (×5): 1 mg via ORAL
  Filled 2015-06-22 (×5): qty 1

## 2015-06-22 MED ORDER — ALBUTEROL SULFATE HFA 108 (90 BASE) MCG/ACT IN AERS: 2.0000 | INHALATION_SPRAY | RESPIRATORY_TRACT | Status: DC | PRN

## 2015-06-22 MED ORDER — MAGNESIUM OXIDE 400 (241.3 MG) MG PO TABS
400.0000 mg | ORAL_TABLET | Freq: Every day | ORAL | Status: DC
  Administered 2015-06-23 – 2015-06-26 (×4): 400 mg via ORAL
  Filled 2015-06-22 (×6): qty 1

## 2015-06-22 MED ORDER — INSULIN ASPART 100 UNIT/ML ~~LOC~~ SOLN
0.0000 [IU] | SUBCUTANEOUS | Status: DC
  Administered 2015-06-23: 2 [IU] via SUBCUTANEOUS
  Administered 2015-06-24: 1 [IU] via SUBCUTANEOUS
  Administered 2015-06-24: 2 [IU] via SUBCUTANEOUS
  Administered 2015-06-25 (×5): 1 [IU] via SUBCUTANEOUS
  Administered 2015-06-25: 2 [IU] via SUBCUTANEOUS
  Administered 2015-06-26: 1 [IU] via SUBCUTANEOUS

## 2015-06-22 MED ORDER — ALBUTEROL SULFATE (2.5 MG/3ML) 0.083% IN NEBU: 2.5000 mg | INHALATION_SOLUTION | RESPIRATORY_TRACT | Status: DC | PRN

## 2015-06-22 MED ORDER — GABAPENTIN 300 MG PO CAPS
600.0000 mg | ORAL_CAPSULE | Freq: Three times a day (TID) | ORAL | Status: DC
  Administered 2015-06-22 – 2015-06-26 (×11): 600 mg via ORAL
  Filled 2015-06-22 (×10): qty 2
  Filled 2015-06-22: qty 6

## 2015-06-22 NOTE — ED Notes (Signed)
Pt aware of urine sample needed, pt given urinal to give sample 

## 2015-06-22 NOTE — H&P (Signed)
[facility] History and Physical  Curtis Estrada ONG:[id number] DOB: 1956-07-11 DOA: 06/22/2015  Referring physician: ED physician [facility]CP: [facility]  Specialists[facility]  Chief Complaint: Abdominal pain, swelling   HPI: Curtis Estrada is a 59 y.o. male with PMH of hypertension, hyperlipidemia, insulin-dependent diabetes mellitus, chronic diastolic CHF, and liver cirrhosis secondary to chronic hepatitis C and alcohol abuse who presents to the ED with abdominal pain and swelling, and is found to be in acute renal failure. Curtis Estrada reports recurrent ascites over the past year with therapeutic paracentesis approximately every week. Last [facility] was [facility], where 7 L of fluid were removed. He has been in his usual state since that time with increasing abdominal distention, now becoming painful and causing some mild respiratory compromise. He denies any fevers, chills, nausea, vomiting, or diarrhea. He denies hematemesis, melena, or bright red blood per rectum and does not believe that he has esophageal varices. He does have a history of SBP, not on prophylaxis, but denying in the infectious symptoms.  In ED, patient was found to have tachycardia to the low 100s, his usual marginal blood pressures, and no apparent respiratory distress or fevers. Basic lab work was obtained and most notable for a serum creatinine of 2.7, up from 1.0 just 1 month prior. Patient denies any recent illness or change in his medications, but does endorse less frequent urination and darkening of his urine. He also endorses increased fatigue and loss of appetite over the last week. His INR was 1.43 and an and MELD score of 21 this admission. 2 view chest x-ray was negative for pulmonary edema or vascular congestion and the patient was admitted to telemetry for ongoing evaluation and management of acute renal failure worrisome for the possible development of  hepatorenal disease.  Where does patient live?   At home     Can patient participate in ADLs?  Yes      Review of Systems:   General: no fevers, chills, sweats, weight change. Poor appetite, and fatigue HEENT: no blurry vision, hearing changes or sore throat Pulm: Dyspneic with activity, no cough, or wheeze CV: no chest pain or palpitations Abd: Abdominal pain over lower quadrants. no nausea, vomiting, abdominal pain, diarrhea, or constipation GU: no dysuria, hematuria, or urgency. Dark urine  Ext: Chronic bilateral lower extremity edema to the groin  Neuro: no focal weakness, numbness, or tingling, no vision change or hearing loss Skin: no rash, no wounds MSK: No muscle spasm, no deformity, no red, hot, or swollen joint Heme: No easy bruising or bleeding Travel history: No recent long distant travel    Allergy:  Allergies  Allergen Reactions  . Penicillins     Past Medical History  Diagnosis Date  . Hypertension   . 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 ETOH abuse   . Ascites 01/2013  . Hematoma 01/2013    posterior right flank from a fall  . Coagulopathy (HCC) 01/2013    secondary to liver disease.   . Anemia 02/2013  . Hyponatremia 01/2013  . Unspecified constipation 04/04/2013  . H pylori ulcer 03/05/2013  . Gastric ulcer with hemorrhage 03/03/2013  . Chronic wound of extremity-right great toe 03/03/2013  . Acute blood loss anemia 03/03/2013  . Spontaneous bacterial peritonitis (HCC) 09/20/2014  . Hyperlipidemia   . Pneumonia ? 09/2014; 02/2015  . Chronic bronchitis (HCC)   . GERD (gastroesophageal reflux disease)   .  Daily headache   . Arthritis     "shoulders" (03/21/2015)  . Type II diabetes mellitus (HCC)     uncontrolled  . Hepatitis C     Past Surgical History  Procedure Laterality Date  . Tonsillectomy    . Esophagogastroduodenoscopy N/A 03/03/2013    Procedure: ESOPHAGOGASTRODUODENOSCOPY (EGD);  Surgeon: Beverley Fiedler, MD;  Location: [facility] ENDOSCOPY;   Service: Gastroenterology;  Laterality: N/A;  Bedside  . Inguinal hernia repair Right 1960's  . Paracentesis  09/2014; 02/2015; 03/21/2015    Social History:  reports that he has been smoking Cigarettes.  He has a 4.7 pack-year smoking history. He has never used smokeless tobacco. He reports that he drinks alcohol. He reports that he does not use illicit drugs.  Family History:  Family History  Problem Relation Age of Onset  . Dementia Mother   . Cancer - Other Father      Prior to Admission medications   Medication Sig Start Date End Date Taking? Authorizing Provider  albuterol (PROVENTIL HFA;VENTOLIN HFA) 108 (90 BASE) MCG/ACT inhaler Inhale 2 puffs into the lungs every 2 (two) hours as needed for wheezing or shortness of breath (or coughing). 05/30/14  Yes Dione Booze, MD  folic acid (FOLVITE) 1 MG tablet Take 1 tablet (1 mg total) by mouth daily. 09/21/14  Yes Maryruth Bun Rama, MD  furosemide (LASIX) 40 MG tablet Take 1 tablet (40 mg total) by mouth 2 (two) times daily. 04/29/15  Yes Kathlen Mody, MD  gabapentin (NEURONTIN) 300 MG capsule Take 600 mg by mouth 3 (three) times daily.   Yes Historical Provider, MD  HYDROCODONE-ACETAMINOPHEN PO Take 1 tablet by mouth daily as needed (pain).   Yes Historical Provider, MD  insulin aspart (NOVOLOG) 100 UNIT/ML injection Inject 0-9 Units into the skin 4 (four) times daily. 0-6 Units, Subcutaneous, 3 times daily with meals  CBG < 70: implement hypoglycemia protocol; CBG 70 - 120: 0 units; CBG 121 - 150: 1 unit; CBG 151 - 200: 2 units; CBG 201 - 250: 3 units; CBG 251 - 300: 5 units; CBG 301 - 350: 7 units; CBG 351 - 400: 9 units; CBG > 400: call MD 03/10/13  Yes Shanker Levora Dredge, MD  insulin detemir (LEVEMIR) 100 UNIT/ML injection Inject 0.1 mLs (10 Units total) into the skin at bedtime. Patient taking differently: Inject 14 Units into the skin at bedtime.  04/29/15  Yes Kathlen Mody, MD  lactulose (CHRONULAC) 10 GM/15ML solution Take 15 mLs (10 g  total) by mouth 2 (two) times daily. 03/24/15  Yes Maryann Mikhail, DO  Magnesium Oxide 420 MG TABS Take 1 tablet by mouth daily.   Yes Historical Provider, MD  Multiple Vitamin (MULTIVITAMIN WITH MINERALS) TABS tablet Take 1 tablet by mouth daily. 09/21/14  Yes Christina P Rama, MD  pantoprazole (PROTONIX) 40 MG tablet Take 40 mg by mouth 2 (two) times daily.    Yes Historical Provider, MD  polyvinyl alcohol (LIQUIFILM TEARS) 1.4 % ophthalmic solution Place 1 drop into both eyes daily as needed (dry eyes).    Yes Historical Provider, MD  propranolol (INDERAL) 10 MG tablet Take 1 tablet (10 mg total) by mouth 2 (two) times daily. 02/19/15  Yes Catarina Hartshorn, MD  rifaximin (XIFAXAN) 550 MG TABS tablet Take 1 tablet (550 mg total) by mouth 2 (two) times daily. 03/24/15  Yes Maryann Mikhail, DO  spironolactone (ALDACTONE) 25 MG tablet Take 25 mg by mouth daily.   Yes Historical Provider, MD  benzonatate Kimberlee Nearing)  200 MG capsule Take 1 capsule (200 mg total) by mouth 2 (two) times daily as needed for cough. Patient not taking: Reported on 05/21/2015 04/21/15   Edsel Petrin, DO    Physical Exam: Filed Vitals:   06/22/15 1830 06/22/15 1925 06/22/15 1930 06/22/15 1948  BP: 110/82 113/81 109/85 108/83  Pulse: 111 109 107 113  Temp:    98.3 F (36.8 C)  TempSrc:    Oral  Resp:  12  18  SpO2: 99% 99% 100% 97%   General: Not in acute distress HEENT:       Eyes: PERRL, EOMI, no scleral icterus or conjunctival pallor.       ENT: No discharge from the ears or nose, no pharyngeal ulcers, petechiae or exudate, no tonsillar enlargement.        Neck: Mild NVD, no bruit, no appreciable mass Heme: No cervical adenopathy, no pallor Cardiac: Rate of proximal 100 and regular with S4 gallop. No murmurs, or rubs. Pulm: Good air movement bilaterally. No rales, wheezing, rhonchi or rubs. Abd: Taut and distended, mildly tender across the lower quadrants, no rebound pain or gaurding. Ext: Massive lower extremity edema  bilaterally up to the groin. 2+DP/PT pulse bilaterally. Musculoskeletal: No gross deformity, no red, hot, swollen joints, no limitation in ROM  Skin: No rashes or wounds on exposed surfaces  Neuro: Alert, oriented X3. No focal findings Psych: Patient is not overtly psychotic.  Labs on Admission:  Basic Metabolic Panel:  Recent Labs Lab 06/22/15 1458 06/22/15 2100  NA 136  --   K 4.6  --   CL 105  --   CO2 22  --   GLUCOSE 160*  --   BUN 22*  --   CREATININE 2.73*  --   CALCIUM 8.7*  --   MG  --  2.0   Liver Function Tests:  Recent Labs Lab 06/22/15 1458  AST 72*  ALT 24  ALKPHOS 145*  BILITOT 1.0  PROT 5.8*  ALBUMIN 1.9*    Recent Labs Lab 06/22/15 1458  LIPASE 38   No results for input(s): AMMONIA in the last 168 hours. CBC:  Recent Labs Lab 06/22/15 1458  WBC 7.5  HGB 13.3  HCT 40.7  MCV 74.1*  PLT 247   Cardiac Enzymes: No results for input(s): CKTOTAL, CKMB, CKMBINDEX, TROPONINI in the last 168 hours.  BNP (last 3 results)  Recent Labs  04/23/15 1943 06/22/15 1458 06/22/15 2100  BNP 25.7 23.0 25.2    ProBNP (last 3 results) No results for input(s): PROBNP in the last 8760 hours.  CBG: No results for input(s): GLUCAP in the last 168 hours.  Radiological Exams on Admission: Dg Chest 2 View  06/22/2015  CLINICAL DATA:  Abdominal swelling, cirrhosis, multiple abdominal drains EXAM: CHEST  2 VIEW COMPARISON:  05/21/2015 FINDINGS: Study is limited by poor inspiration. Cardiomediastinal silhouette is stable. Mild left basilar atelectasis or scarring again noted. No infiltrate or pulmonary edema. IMPRESSION: Mild left basilar atelectasis or scarring again noted. No infiltrate or pulmonary edema. Electronically Signed   By: Natasha Mead M.D.   On: 06/22/2015 18:46   US Renal  06/22/2015  CLINICAL DATA:  Acute renal failure EXAM: RENAL / URINARY TRACT ULTRASOUND COMPLETE COMPARISON:  None. FINDINGS: Right Kidney: Length: 11.9 cm. Echogenicity  within normal limits. No mass or hydronephrosis visualized. Left Kidney: Length: 10 cm. Echogenicity within normal limits. No mass or hydronephrosis visualized. Bladder: Appears normal for degree of bladder distention. Bilateral ureteral jets are  visualized. Abundant abdominal ascites. IMPRESSION: 1. No hydronephrosis. No renal calculus. Abundant abdominal ascites. Bilateral ureteral jets are visualized within bladder. Electronically Signed   By: Natasha Mead M.D.   On: 06/22/2015 21:37    EKG: Independently reviewed.  Normal sinus rhythm  Assessment/Plan  1. Acute kidney injury - Creatinine 2.7, up from 1.01 month ago - Etiology unclear at this time, possibly related to fluid shifts with his last paracentesis, or overdiuresis. Possible that he is developing hepatorenal syndrome - Renal ultrasound and urine studies will be obtained to help identify etiology, and nephrotoxins will be avoided - There's no pulmonary edema on chest x-ray, and patient may be a little intravascularly depleted given the tachycardia, so his diuretics will be held initially - Albumin could be given prior to diuretic, with midodrine if necessary for blood pressure support, I will consider this after paracentesis as below  2. Cirrhosis with ascites - Patient reports a long history of alcohol abuse, noting that he is an abstinent for the past 8 months - He also has chronic hepatitis C - He has history of hepatic encephalopathy and his continued lactulose and rifaximin at home - There are no known esophageal varices and the patient is on propranolol for prophylaxis - Lasix and spironolactone will be held for now given the kidney disease - With his history of prior SBP, antibiotic prophylaxis will be initiated with Bactrim DS one tab daily - Low suspicion for SBP at this time has no signs or symptoms of infectious process  - Dr. Dulce Sellar of [facility] kindly discussed the case with me and recommended proceeding with a therapeutic  paracentesis with albumin - Paracentesis by IR as been requested, and per GI recommendations patient will be given 50 mg of albumin immediately after this - Fluid studies will be sent, and ascitic protein content may support or oppose the diagnosis of hepatorenal syndrome  3. Insulin-dependent diabetes mellitus - CBG with meals and at bedtime, every 4 while nothing by mouth - Basal insulin will be given at lower than home dose given his acute renal failure, and sliding scale correctional will be used - Monitor glucose and adjust insulins as needed  4. History of hypertension - Patient has reported history of hypertension, but blood pressures have been marginal since arrival, which the patient confirms his his baseline - Holding his diuretics initially with labs pending and will consider resumption versus gentle fluid hydration in the morning  DVT ppx: SQ Heparin    Code Status: Full code Family Communication: None at bed side.  Disposition Plan: Admit to inpatient   Date of Service 06/22/2015    Briscoe Deutscher, MD [facility] Pager [phone]  If 7PM-7AM, please contact night-coverage [email or web address] Password [email or web address] 06/22/2015, 9:55 PM

## 2015-06-22 NOTE — ED Notes (Signed)
Pt c/o abdominal swelling x9 months, has had it drained multiple times. Hx of cirrhosis of the liver, hepatitis. Pt in NAD, abdomen is distended and taut. AAOX4, NAD.

## 2015-06-22 NOTE — Progress Notes (Signed)
Received report from ED nurse. Marquita PalmsMario.

## 2015-06-22 NOTE — ED Notes (Signed)
MD at bedside. 

## 2015-06-22 NOTE — ED Provider Notes (Signed)
CSN: [id number]     Arrival date & time 06/22/15  1447 History   First MD Initiated Contact with Patient 06/22/15 1754     Chief Complaint  Patient presents with  . Ascites     (Consider location/radiation/quality/duration/timing/severity/associated sxs/prior Treatment) HPI Comments: 59 year old male with extensive past medical history including cirrhosis c/b ascites, HTN, GERD who presents with ascites. The patient states that his last therapeutic paracentesis was 8 days ago at the [place]. He has since been accumulated more fluid in his abdomen. He reports generalized abdominal discomfort due to the distention and occasional right upper quadrant pain. He has a chronic cough. He reports mild shortness of breath related to the abdominal distention which she has had before. No chest pain. No fevers, vomiting, or skin changes.  The history is provided by the patient.    Past Medical History  Diagnosis Date  . Hypertension   . 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 ETOH abuse   . Ascites 01/2013  . Hematoma 01/2013    posterior right flank from a fall  . Coagulopathy (HCC) 01/2013    secondary to liver disease.   . Anemia 02/2013  . Hyponatremia 01/2013  . Unspecified constipation 04/04/2013  . H pylori ulcer 03/05/2013  . Gastric ulcer with hemorrhage 03/03/2013  . Chronic wound of extremity-right great toe 03/03/2013  . Acute blood loss anemia 03/03/2013  . Spontaneous bacterial peritonitis (HCC) 09/20/2014  . Hyperlipidemia   . Pneumonia ? 09/2014; 02/2015  . Chronic bronchitis (HCC)   . GERD (gastroesophageal reflux disease)   . Daily headache   . Arthritis     "shoulders" (03/21/2015)  . Type II diabetes mellitus (HCC)     uncontrolled  . Hepatitis C    Past Surgical History  Procedure Laterality Date  . Tonsillectomy    . Esophagogastroduodenoscopy N/A 03/03/2013    Procedure: ESOPHAGOGASTRODUODENOSCOPY (EGD);  Surgeon: Beverley Fiedler, MD;  Location: [facility] ENDOSCOPY;  Service:  Gastroenterology;  Laterality: N/A;  Bedside  . Inguinal hernia repair Right 1960's  . Paracentesis  09/2014; 02/2015; 03/21/2015   Family History  Problem Relation Age of Onset  . Dementia Mother   . Cancer - Other Father    Social History  Substance Use Topics  . Smoking status: Current Every Day Smoker -- 0.10 packs/day for 47 years    Types: Cigarettes  . Smokeless tobacco: Never Used  . Alcohol Use: Yes     Comment: 03/21/2015 "last drink was 02/15/2015)    Review of Systems 10 Systems reviewed and are negative for acute change except as noted in the HPI.    Allergies  Penicillins  Home Medications   Prior to Admission medications   Medication Sig Start Date End Date Taking? Authorizing Provider  albuterol (PROVENTIL HFA;VENTOLIN HFA) 108 (90 BASE) MCG/ACT inhaler Inhale 2 puffs into the lungs every 2 (two) hours as needed for wheezing or shortness of breath (or coughing). 05/30/14  Yes Dione Booze, MD  folic acid (FOLVITE) 1 MG tablet Take 1 tablet (1 mg total) by mouth daily. 09/21/14  Yes Maryruth Bun Rama, MD  furosemide (LASIX) 40 MG tablet Take 1 tablet (40 mg total) by mouth 2 (two) times daily. 04/29/15  Yes Kathlen Mody, MD  gabapentin (NEURONTIN) 300 MG capsule Take 600 mg by mouth 3 (three) times daily.   Yes Historical Provider, MD  HYDROCODONE-ACETAMINOPHEN PO Take 1 tablet by mouth daily as needed (pain).   Yes Historical  Provider, MD  insulin aspart (NOVOLOG) 100 UNIT/ML injection Inject 0-9 Units into the skin 4 (four) times daily. 0-6 Units, Subcutaneous, 3 times daily with meals  CBG < 70: implement hypoglycemia protocol; CBG 70 - 120: 0 units; CBG 121 - 150: 1 unit; CBG 151 - 200: 2 units; CBG 201 - 250: 3 units; CBG 251 - 300: 5 units; CBG 301 - 350: 7 units; CBG 351 - 400: 9 units; CBG > 400: call MD 03/10/13  Yes Shanker Levora Dredge, MD  insulin detemir (LEVEMIR) 100 UNIT/ML injection Inject 0.1 mLs (10 Units total) into the skin at bedtime. Patient taking  differently: Inject 14 Units into the skin at bedtime.  04/29/15  Yes Kathlen Mody, MD  lactulose (CHRONULAC) 10 GM/15ML solution Take 15 mLs (10 g total) by mouth 2 (two) times daily. 03/24/15  Yes Maryann Mikhail, DO  Magnesium Oxide 420 MG TABS Take 1 tablet by mouth daily.   Yes Historical Provider, MD  Multiple Vitamin (MULTIVITAMIN WITH MINERALS) TABS tablet Take 1 tablet by mouth daily. 09/21/14  Yes Christina P Rama, MD  pantoprazole (PROTONIX) 40 MG tablet Take 40 mg by mouth 2 (two) times daily.    Yes Historical Provider, MD  polyvinyl alcohol (LIQUIFILM TEARS) 1.4 % ophthalmic solution Place 1 drop into both eyes daily as needed (dry eyes).    Yes Historical Provider, MD  propranolol (INDERAL) 10 MG tablet Take 1 tablet (10 mg total) by mouth 2 (two) times daily. 02/19/15  Yes Catarina Hartshorn, MD  rifaximin (XIFAXAN) 550 MG TABS tablet Take 1 tablet (550 mg total) by mouth 2 (two) times daily. 03/24/15  Yes Maryann Mikhail, DO  spironolactone (ALDACTONE) 25 MG tablet Take 25 mg by mouth daily.   Yes Historical Provider, MD  benzonatate (TESSALON) 200 MG capsule Take 1 capsule (200 mg total) by mouth 2 (two) times daily as needed for cough. Patient not taking: Reported on 05/21/2015 04/21/15   Maryann Mikhail, DO   BP 103/77 mmHg  Pulse 117  Temp(Src) 98.2 F (36.8 C) (Oral)  Resp 16  SpO2 92% Physical Exam  Constitutional: He is oriented to person, place, and time. No distress.  Chronically ill appearing, comfortable  HENT:  Head: Normocephalic and atraumatic.  Moist mucous membranes  Eyes: Pupils are equal, round, and reactive to light. Scleral icterus is present.  Neck: Neck supple.  Cardiovascular: Regular rhythm and normal heart sounds.   No murmur heard. tachycardic  Pulmonary/Chest: Effort normal and breath sounds normal.  Abdominal: Bowel sounds are normal. There is no tenderness.  abd tensely distended, non-tender  Musculoskeletal:  2+ pitting edema b/l LE  Neurological: He  is alert and oriented to person, place, and time.  Fluent speech  Skin: Skin is warm and dry.  Psychiatric: He has a normal mood and affect. Judgment normal.  Nursing note and vitals reviewed.   ED Course  Procedures (including critical care time) Labs Review Labs Reviewed  COMPREHENSIVE METABOLIC PANEL - Abnormal; Notable for the following:    Glucose, Bld 160 (*)    BUN 22 (*)    Creatinine, Ser 2.73 (*)    Calcium 8.7 (*)    Total Protein 5.8 (*)    Albumin 1.9 (*)    AST 72 (*)    Alkaline Phosphatase 145 (*)    GFR calc non Af Amer 24 (*)    GFR calc Af Amer 27 (*)    All other components within normal limits  CBC - Abnormal;  Notable for the following:    MCV 74.1 (*)    MCH 24.2 (*)    RDW 18.6 (*)    All other components within normal limits  LIPASE, BLOOD  BRAIN NATRIURETIC PEPTIDE  SODIUM, URINE, RANDOM  CREATININE, URINE, RANDOM  URINALYSIS, ROUTINE W REFLEX MICROSCOPIC (NOT AT [facility])    Imaging Review No results found. I have personally reviewed and evaluated these lab results as part of my medical decision-making.   EKG Interpretation None      MDM   Final diagnoses:  Ascites  Acute renal failure, unspecified acute renal failure type (HCC)   Pt w/ h/o cirrhosis and multiple, frequent therapeutic paracenteses who presents with abdominal distention, requesting therapeutic paracentesis. On exam, the patient was tachycardic, chronically ill-appearing but in no acute distress. No abdominal tenderness on exam. No reports of fever. Labs notable for creatinine of 2.73 which is significantly elevated from the patient's baseline. I have added urine creatinine and sodium, BNP, chest x-ray. I am concerned about hepatorenal syndrome. I discussed patient with [facility] hospital[facility], Dr. Antionette Charpyd, and pt admitted for therapeutic paracentesis as well as workup of his acute renal failure.   Laurence Spatesachel Morgan Little, MD 06/22/15 [phone]

## 2015-06-23 ENCOUNTER — Inpatient Hospital Stay (HOSPITAL_COMMUNITY): Payer: Non-veteran care

## 2015-06-23 DIAGNOSIS — D689 Coagulation defect, unspecified: Secondary | ICD-10-CM

## 2015-06-23 DIAGNOSIS — R188 Other ascites: Secondary | ICD-10-CM

## 2015-06-23 DIAGNOSIS — R0600 Dyspnea, unspecified: Secondary | ICD-10-CM

## 2015-06-23 DIAGNOSIS — N179 Acute kidney failure, unspecified: Secondary | ICD-10-CM

## 2015-06-23 DIAGNOSIS — E118 Type 2 diabetes mellitus with unspecified complications: Secondary | ICD-10-CM

## 2015-06-23 DIAGNOSIS — R600 Localized edema: Secondary | ICD-10-CM

## 2015-06-23 LAB — GLUCOSE, CAPILLARY
GLUCOSE-CAPILLARY: 92 mg/dL (ref 65–99)
GLUCOSE-CAPILLARY: 136 mg/dL — AB (ref 65–99)
GLUCOSE-CAPILLARY: 111 mg/dL — AB (ref 65–99)
Glucose-Capillary: 101 mg/dL — ABNORMAL HIGH (ref 65–99)
Glucose-Capillary: 156 mg/dL — ABNORMAL HIGH (ref 65–99)
Glucose-Capillary: 76 mg/dL (ref 65–99)

## 2015-06-23 LAB — BODY FLUID CELL COUNT WITH DIFFERENTIAL
EOS FL: 0 %
LYMPHS FL: 86 %
MONOCYTE-MACROPHAGE-SEROUS FLUID: 14 % — AB (ref 50–90)
NEUTROPHIL FLUID: 0 % (ref 0–25)
WBC FLUID: 171 uL (ref 0–1000)

## 2015-06-23 LAB — CBC WITH DIFFERENTIAL/PLATELET
BASOS ABS: 0.1 10*3/uL (ref 0.0–0.1)
BASOS PCT: 1 %
Eosinophils Absolute: 0.3 10*3/uL (ref 0.0–0.7)
Eosinophils Relative: 3 %
HEMATOCRIT: 37.5 % — AB (ref 39.0–52.0)
HEMOGLOBIN: 12.6 g/dL — AB (ref 13.0–17.0)
Lymphocytes Relative: 19 %
Lymphs Abs: 1.7 10*3/uL (ref 0.7–4.0)
MCH: 24.6 pg — ABNORMAL LOW (ref 26.0–34.0)
MCHC: 33.6 g/dL (ref 30.0–36.0)
MCV: 73.1 fL — ABNORMAL LOW (ref 78.0–100.0)
Monocytes Absolute: 1.9 10*3/uL — ABNORMAL HIGH (ref 0.1–1.0)
Monocytes Relative: 22 %
NEUTROS ABS: 4.9 10*3/uL (ref 1.7–7.7)
NEUTROS PCT: 56 %
Platelets: 266 10*3/uL (ref 150–400)
RBC: 5.13 MIL/uL (ref 4.22–5.81)
RDW: 18.4 % — AB (ref 11.5–15.5)
WBC: 8.8 10*3/uL (ref 4.0–10.5)

## 2015-06-23 LAB — GLUCOSE, SEROUS FLUID: Glucose, Fluid: 133 mg/dL

## 2015-06-23 LAB — GRAM STAIN

## 2015-06-23 LAB — COMPREHENSIVE METABOLIC PANEL
ALK PHOS: 117 U/L (ref 38–126)
ALT: 20 U/L (ref 17–63)
ANION GAP: 6 (ref 5–15)
AST: 60 U/L — ABNORMAL HIGH (ref 15–41)
Albumin: 1.7 g/dL — ABNORMAL LOW (ref 3.5–5.0)
BILIRUBIN TOTAL: 1.1 mg/dL (ref 0.3–1.2)
BUN: 26 mg/dL — ABNORMAL HIGH (ref 6–20)
CALCIUM: 8.3 mg/dL — AB (ref 8.9–10.3)
CO2: 23 mmol/L (ref 22–32)
Chloride: 105 mmol/L (ref 101–111)
Creatinine, Ser: 2.61 mg/dL — ABNORMAL HIGH (ref 0.61–1.24)
GFR calc non Af Amer: 25 mL/min — ABNORMAL LOW (ref >60)
GFR, EST AFRICAN AMERICAN: 29 mL/min — AB (ref >60)
Glucose, Bld: 98 mg/dL (ref 65–99)
Potassium: 4.7 mmol/L (ref 3.5–5.1)
Sodium: 134 mmol/L — ABNORMAL LOW (ref 135–145)
TOTAL PROTEIN: 5.3 g/dL — AB (ref 6.5–8.1)

## 2015-06-23 LAB — ALBUMIN, FLUID (OTHER)

## 2015-06-23 LAB — PROTEIN, BODY FLUID: Total protein, fluid: <3 g/dL

## 2015-06-23 LAB — APTT: aPTT: 44 seconds — ABNORMAL HIGH (ref 24–37)

## 2015-06-23 LAB — PROTIME-INR
INR: 1.6 — ABNORMAL HIGH (ref 0.00–1.49)
PROTHROMBIN TIME: 19.1 s — AB (ref 11.6–15.2)

## 2015-06-23 LAB — LACTATE DEHYDROGENASE, PLEURAL OR PERITONEAL FLUID: LD FL: 48 U/L — AB (ref 3–23)

## 2015-06-23 LAB — HEMOGLOBIN A1C
Hgb A1c MFr Bld: 8.4 % — ABNORMAL HIGH (ref 4.8–5.6)
MEAN PLASMA GLUCOSE: 194 mg/dL

## 2015-06-23 MED ORDER — LIDOCAINE HCL (PF) 1 % IJ SOLN
INTRAMUSCULAR | Status: AC
  Filled 2015-06-23: qty 10

## 2015-06-23 MED ORDER — DEXTROSE 5 % IV SOLN
2.0000 g | INTRAVENOUS | Status: DC
  Administered 2015-06-23 – 2015-06-24 (×2): 2 g via INTRAVENOUS
  Filled 2015-06-23 (×2): qty 2

## 2015-06-23 MED ORDER — IPRATROPIUM-ALBUTEROL 0.5-2.5 (3) MG/3ML IN SOLN: 3.0000 mL | Freq: Four times a day (QID) | RESPIRATORY_TRACT | Status: DC | PRN

## 2015-06-23 MED ORDER — ONDANSETRON HCL 4 MG/2ML IJ SOLN
4.0000 mg | Freq: Four times a day (QID) | INTRAMUSCULAR | Status: DC | PRN
  Administered 2015-06-23: 4 mg via INTRAVENOUS
  Filled 2015-06-23: qty 2

## 2015-06-23 MED ORDER — SODIUM CHLORIDE 0.9 % IV BOLUS (SEPSIS)
500.0000 mL | Freq: Once | INTRAVENOUS | Status: AC
Start: 2015-06-23 — End: 2015-06-23
  Administered 2015-06-23: 500 mL via INTRAVENOUS

## 2015-06-23 MED ORDER — IPRATROPIUM-ALBUTEROL 0.5-2.5 (3) MG/3ML IN SOLN
3.0000 mL | Freq: Four times a day (QID) | RESPIRATORY_TRACT | Status: DC
  Administered 2015-06-23: 3 mL via RESPIRATORY_TRACT
  Filled 2015-06-23: qty 3

## 2015-06-23 NOTE — Progress Notes (Signed)
Patient BP 86/59, rechecked BP 82/57; on-call NP Claiborne Billingsallahan notified.  Awaiting orders and will continue to monitor the patient.

## 2015-06-23 NOTE — Procedures (Signed)
Successful LG VOL paracentesis No comp Stable Full report in PACS Labs sent

## 2015-06-23 NOTE — Progress Notes (Signed)
NURSING PROGRESS NOTE  Cindi CarbonWillie Bridgewater [id number] Admission Data: 06/23/2015 1:26 AM Attending Provider: Briscoe Deutscherimothy S Opyd, MD PCP:[facility] Code Status: Full  Allergies:  Penicillins Past Medical History:   has a past medical history of Hypertension; Cirrhosis (HCC) (01/2013); ETOH abuse; Ascites (01/2013); Hematoma (01/2013); Coagulopathy (HCC) (01/2013); Anemia (02/2013); Hyponatremia (01/2013); Unspecified constipation (04/04/2013); H pylori ulcer (03/05/2013); Gastric ulcer with hemorrhage (03/03/2013); Chronic wound of extremity-right great toe (03/03/2013); Acute blood loss anemia (03/03/2013); Spontaneous bacterial peritonitis (HCC) (09/20/2014); Hyperlipidemia; Pneumonia (? 09/2014; 02/2015); Chronic bronchitis (HCC); GERD (gastroesophageal reflux disease); Daily headache; Arthritis; Type II diabetes mellitus (HCC); and Hepatitis C. Past Surgical History:   has past surgical history that includes Tonsillectomy; Esophagogastroduodenoscopy (N/A, 03/03/2013); Inguinal hernia repair (Right, 1960's); and Paracentesis (09/2014; 02/2015; 03/21/2015). Social History:   reports that he has been smoking Cigarettes.  He has a 4.7 pack-year smoking history. He has never used smokeless tobacco. He reports that he drinks alcohol. He reports that he does not use illicit drugs.  Cindi CarbonWillie Fiscus is a 59 y.o. male patient admitted from ED:   Last Documented Vital Signs: Blood pressure 102/71, pulse 104, temperature 98.1 F (36.7 C), temperature source Oral, resp. rate 16, SpO2 97 %.  Cardiac Monitoring: Box # 6 in place. Cardiac monitor yields:normal sinus rhythm.  IV Fluids:  IV in place, occlusive dsg intact without redness, IV cath antecubital right, condition patent and no redness none.   Skin: Dry and intact.  Patient orientated to room. Information packet given to patient. Admission inpatient armband information verified with patient to include name and date of birth and placed on patient arm. Side rails up x  2, fall assessment and education completed with patient. Patient able to verbalize understanding of risk associated with falls and verbalized understanding to call for assistance before getting out of bed. Call light within reach. Patient able to voice and demonstrate understanding of unit orientation instructions.    Will continue to evaluate and treat per MD orders.  Sue LushKaelin Romesberg RN, BSN

## 2015-06-23 NOTE — Progress Notes (Addendum)
2 lb weight gain noted from admission 12/9 to this am 12/10 (92.5kg-93.5kg).Systolic BP is 80's. Dr Isidoro Donningai made aware.

## 2015-06-23 NOTE — Progress Notes (Signed)
Patient seen and examined, admitted by Dr Antionette Charpyd  this morning. Agree with H&P, assessment and plan outlined by admitting physician.   Labs and imagings reviewed by myself.  BP 100/56 mmHg  Pulse 92  Temp(Src) 98.2 F (36.8 C) (Oral)  Resp 18  Ht 5\' 7"  (1.702 m)  Wt 93.5 kg (206 lb 2.1 oz)  BMI 32.28 kg/m2  SpO2 90%   Assessment & Plan:    Principal Problem:   Ascites in the setting of chronic diastolic CHF, liver cirrhosis, chronic hepatitis C - Creatinine 2.73 at the time of admission, was 1.02 on 11/7 - Ultrasound-guided therapeutic and diagnostic paracentesis ordered, followed by albumin 50 g 1, follow labs - Placed on IV Rocephin for SBP prophylaxis - GI, Dr. Dulce Sellarutlaw was consulted - BP, borderline was in 80s this morning, for now will have to hold Lasix, spironolactone, propranolol  Active Problems:   Coagulopathy (HCC) - Due to liver cirrhosis and chronic hepatitis C, follow closely  Acute kidney injury: Unclear etiology at this time, possible hepatorenal syndrome - Creatinine 2.73 at the time of admission, was 1.02 on 11/7 - Follow paracentesis labs, GI has been consulted - If no significant improvement, will obtain renal ultrasound    Dyspnea, Bilateral edema of lower extremity: Likely due to #1    DM (diabetes mellitus) with complications (HCC) - Currently NPO, follow CBGs q4hours  Hypotension with history of hypertension - Hold Lasix, Aldactone, propranolol  History of hepatic encephalopathy - Continue rifaximin, lactulose, currently alert and oriented  Acute bronchitis - Placed on scheduled nebs  Curtis Estrada M.D. [facility] 06/23/2015, 9:40 AM  Pager: [phone]

## 2015-06-24 LAB — COMPREHENSIVE METABOLIC PANEL
ALBUMIN: 1.9 g/dL — AB (ref 3.5–5.0)
ALT: 16 U/L — AB (ref 17–63)
AST: 53 U/L — ABNORMAL HIGH (ref 15–41)
Alkaline Phosphatase: 82 U/L (ref 38–126)
Anion gap: 8 (ref 5–15)
BUN: 25 mg/dL — ABNORMAL HIGH (ref 6–20)
CHLORIDE: 103 mmol/L (ref 101–111)
CO2: 23 mmol/L (ref 22–32)
CREATININE: 2.09 mg/dL — AB (ref 0.61–1.24)
Calcium: 8.1 mg/dL — ABNORMAL LOW (ref 8.9–10.3)
GFR calc non Af Amer: 33 mL/min — ABNORMAL LOW (ref >60)
GFR, EST AFRICAN AMERICAN: 38 mL/min — AB (ref >60)
GLUCOSE: 87 mg/dL (ref 65–99)
Potassium: 4.6 mmol/L (ref 3.5–5.1)
SODIUM: 134 mmol/L — AB (ref 135–145)
Total Bilirubin: 1.1 mg/dL (ref 0.3–1.2)
Total Protein: 4.9 g/dL — ABNORMAL LOW (ref 6.5–8.1)

## 2015-06-24 LAB — CBC
HCT: 32.6 % — ABNORMAL LOW (ref 39.0–52.0)
HEMOGLOBIN: 10.9 g/dL — AB (ref 13.0–17.0)
MCH: 24.4 pg — ABNORMAL LOW (ref 26.0–34.0)
MCHC: 33.4 g/dL (ref 30.0–36.0)
MCV: 72.9 fL — ABNORMAL LOW (ref 78.0–100.0)
PLATELETS: 224 10*3/uL (ref 150–400)
RBC: 4.47 MIL/uL (ref 4.22–5.81)
RDW: 18.2 % — AB (ref 11.5–15.5)
WBC: 6.6 10*3/uL (ref 4.0–10.5)

## 2015-06-24 LAB — GLUCOSE, CAPILLARY
GLUCOSE-CAPILLARY: 79 mg/dL (ref 65–99)
GLUCOSE-CAPILLARY: 108 mg/dL — AB (ref 65–99)
GLUCOSE-CAPILLARY: 118 mg/dL — AB (ref 65–99)
GLUCOSE-CAPILLARY: 159 mg/dL — AB (ref 65–99)
Glucose-Capillary: 124 mg/dL — ABNORMAL HIGH (ref 65–99)
Glucose-Capillary: 124 mg/dL — ABNORMAL HIGH (ref 65–99)
Glucose-Capillary: 151 mg/dL — ABNORMAL HIGH (ref 65–99)

## 2015-06-24 LAB — PROTIME-INR
INR: 1.57 — AB (ref 0.00–1.49)
Prothrombin Time: 18.8 seconds — ABNORMAL HIGH (ref 11.6–15.2)

## 2015-06-24 MED ORDER — TRAMADOL HCL 50 MG PO TABS
50.0000 mg | ORAL_TABLET | Freq: Once | ORAL | Status: AC
  Administered 2015-06-24: 50 mg via ORAL
  Filled 2015-06-24: qty 1

## 2015-06-24 MED ORDER — SODIUM CHLORIDE 0.9 % IV BOLUS (SEPSIS): 500.0000 mL | Freq: Once | INTRAVENOUS | Status: DC

## 2015-06-24 NOTE — Progress Notes (Signed)
[facility]                                                                              Patient Demographics  Curtis Estrada, is a 59 y.o. male, DOB - 20-Mar-1956, NWG:[id number]  Admit date - 06/22/2015   Admitting Physician Briscoe Deutscher, MD  Outpatient Primary MD for the patient is [facility]  LOS - 2   Chief Complaint  Patient presents with  . Ascites       Brief HPI   Curtis Estrada is a 59 y.o. male with PMH of hypertension, hyperlipidemia, insulin-dependent diabetes mellitus, chronic diastolic CHF, and liver cirrhosis secondary to chronic hepatitis C and alcohol abuse who presented to the ED with abdominal pain and swelling, found to be in acute renal failure. Curtis Estrada reported recurrent ascites over the past year with therapeutic paracentesis approximately every week. Last paracentesis was approximately 8 days Prior to admission at the [facility], where 7 L of fluid were removed. He presented with increasing abdominal distention, dyspnea and pain. He does have a history of SBP, not on prophylaxis, but denying in the infectious symptoms. In ED, patient was found to have tachycardia to the low 100s, his usual marginal blood pressures, and no apparent respiratory distress or fevers. Basic lab work was obtained and most notable for a serum creatinine of 2.7, up from 1.0 just 1 month prior. Patient denied any recent illness or change in his medications, but does endorse less frequent urination and darkening of his urine. He also endorsed increased fatigue and loss of appetite over the last week. His INR was 1.43 and an and MELD score of 21 this admission. 2 view chest x-ray was negative for pulmonary edema or vascular congestion. Patient was admitted for further workup.     Assessment & Plan    Principal Problem:  Ascites in the setting of chronic diastolic CHF, liver cirrhosis, chronic hepatitis C - Creatinine 2.73 at the time of admission, was  1.02 on 11/7improving to 2.0 - Ultrasound-guided therapeutic and diagnostic paracentesis, followed by albumin 50 g 1Dunn, 8 L removed. Labs not consistent with SBP, discontinued IV Rocephin - continue to hold Lasix, spironolactone, propranolol, BP marginal  Active Problems:  Coagulopathy (HCC) - Due to liver cirrhosis and chronic hepatitis C, follow closely  Acute kidney injury: Unclear etiology at this time, possible hepatorenal syndrome - Creatinine 2.73 at the time of admission, was 1.02 on 11/7 - improving   Dyspnea, Bilateral edema of lower extremity: Likely due to #1   DM (diabetes mellitus) with complications (HCC) - CBG stable  Hypotension with history of hypertension - Hold Lasix, Aldactone, propranolol  History of hepatic encephalopathy - Continue rifaximin, lactulose, currently alert and oriented  Acute bronchitis - Placed on scheduled nebs  Code Status: FC  Family Communication: Discussed in detail with the patient, all imaging results, lab results explained to the patient    Disposition Plan: possible 24-48hrs   Time Spent in minutes 25 minutes  Procedures  paracentesis  Consults   GI was consulted in ed  DVT Prophylaxis  heparin  Medications  Scheduled Meds: .  folic acid  1 mg Oral Daily  . gabapentin  600 mg Oral TID  . heparin  5,000 Units Subcutaneous 3 times per day  . insulin aspart  0-9 Units Subcutaneous 6 times per day  . insulin glargine  10 Units Subcutaneous QHS  . lactulose  10 g Oral BID  . magnesium oxide  400 mg Oral Daily  . multivitamin with minerals  1 tablet Oral Daily  . rifaximin  550 mg Oral BID  . sodium chloride  3 mL Intravenous Q12H   Continuous Infusions:  PRN Meds:.albuterol, alum & mag hydroxide-simeth, benzonatate, ipratropium-albuterol, ondansetron   Antibiotics   Anti-infectives    Start     Dose/Rate Route Frequency Ordered Stop   06/23/15 0900  cefTRIAXone (ROCEPHIN) 2 g in dextrose 5 % 50 mL IVPB   Status:  Discontinued     2 g 100 mL/hr over 30 Minutes Intravenous Every 24 hours 06/23/15 0811 06/24/15 1052   06/22/15 2200  rifaximin (XIFAXAN) tablet 550 mg     550 mg Oral 2 times daily 06/22/15 2002     06/22/15 2015  sulfamethoxazole-trimethoprim (BACTRIM DS,SEPTRA DS) 800-160 MG per tablet 1 tablet  Status:  Discontinued     1 tablet Oral Daily 06/22/15 2004 06/23/15 0810        Subjective:   Curtis Estrada was seen and examined today.  Feels a lot better today after paracentesis. Patient denies dizziness, chest pain, N/V/D/C, new weakness, numbess, tingling. No acute events overnight.    Objective:   Blood pressure 87/54, pulse 97, temperature 98.3 F (36.8 C), temperature source Oral, resp. rate 16, height 5\' 7"  (1.702 m), weight 88 kg (194 lb 0.1 oz), SpO2 100 %.  Wt Readings from Last 3 Encounters:  06/24/15 88 kg (194 lb 0.1 oz)  06/04/15 92.137 kg (203 lb 2 oz)  04/30/15 75.1 kg (165 lb 9.1 oz)     Intake/Output Summary (Last 24 hours) at 06/24/15 1053 Last data filed at 06/24/15 1012  Gross per 24 hour  Intake    810 ml  Output    200 ml  Net    610 ml    Exam  General: Alert and oriented x 3, NAD  HEENT:  PERRLA, EOMI, Anicteric Sclera, mucous membranes moist.   Neck: Supple, no JVD, no masses  CVS: S1 S2 auscultated, no rubs, murmurs or gallops. Regular rate and rhythm.  Respiratory: Clear to auscultation bilaterally, no wheezing, rales or rhonchi  Abdomen: Soft, distended, + bowel sounds  Ext: no cyanosis clubbing, 2+ edema  Neuro: AAOx3, Cr N's II- XII. Strength 5/5 upper and lower extremities bilaterally  Skin: No rashes  Psych: Normal affect and demeanor, alert and oriented x3    Data Review   Micro Results Recent Results (from the past 240 hour(s))  Culture, body fluid-bottle     Status: None (Preliminary result)   Collection Time: 06/23/15  9:26 AM  Result Value Ref Range Status   Specimen Description PARACENTESIS ABDOMEN FLUID   Final   Special Requests BAA 10MLS  Final   Culture PENDING  Incomplete   Report Status PENDING  Incomplete  Gram stain     Status: None   Collection Time: 06/23/15  9:26 AM  Result Value Ref Range Status   Specimen Description PARACENTESIS ABDOMEN FLUID  Final   Special Requests NONE  Final   Gram Stain   Final    MODERATE WBC PRESENT, PREDOMINANTLY MONONUCLEAR NO ORGANISMS SEEN  Report Status 06/23/2015 FINAL  Final    Radiology Reports Dg Chest 2 View  06/22/2015  CLINICAL DATA:  Abdominal swelling, cirrhosis, multiple abdominal drains EXAM: CHEST  2 VIEW COMPARISON:  05/21/2015 FINDINGS: Study is limited by poor inspiration. Cardiomediastinal silhouette is stable. Mild left basilar atelectasis or scarring again noted. No infiltrate or pulmonary edema. IMPRESSION: Mild left basilar atelectasis or scarring again noted. No infiltrate or pulmonary edema. Electronically Signed   By: Natasha Mead M.D.   On: 06/22/2015 18:46   US Renal  06/22/2015  CLINICAL DATA:  Acute renal failure EXAM: RENAL / URINARY TRACT ULTRASOUND COMPLETE COMPARISON:  None. FINDINGS: Right Kidney: Length: 11.9 cm. Echogenicity within normal limits. No mass or hydronephrosis visualized. Left Kidney: Length: 10 cm. Echogenicity within normal limits. No mass or hydronephrosis visualized. Bladder: Appears normal for degree of bladder distention. Bilateral ureteral jets are visualized. Abundant abdominal ascites. IMPRESSION: 1. No hydronephrosis. No renal calculus. Abundant abdominal ascites. Bilateral ureteral jets are visualized within bladder. Electronically Signed   By: Natasha Mead M.D.   On: 06/22/2015 21:37   US Paracentesis  06/23/2015  CLINICAL DATA:  Ascites EXAM: ULTRASOUND GUIDED PARACENTESIS COMPARISON:  06/22/2015 PROCEDURE: An ultrasound guided paracentesis was thoroughly discussed with the patient and questions answered. The benefits, risks, alternatives and complications were also discussed. The patient  understands and wishes to proceed with the procedure. Written consent was obtained. Ultrasound was performed to localize and mark an adequate pocket of fluid in the right lower quadrant of the abdomen. The area was then prepped and draped in the normal sterile fashion. 1% Lidocaine was used for local anesthesia. Under ultrasound guidance a safety centesis needle catheter was introduced. Paracentesis was performed. The catheter was removed and a dressing applied. COMPLICATIONS: None immediate FINDINGS: A total of approximately 8 L of cloudy exudative peritoneal fluid was removed. A fluid sample was sent for laboratory analysis. IMPRESSION: Successful ultrasound guided paracentesis yielding 8 L of ascites. Electronically Signed   By: Judie Petit.  Shick M.D.   On: 06/23/2015 10:41    CBC  Recent Labs Lab 06/22/15 1458 06/23/15 0458 06/24/15 0420  WBC 7.5 8.8 6.6  HGB 13.3 12.6* 10.9*  HCT 40.7 37.5* 32.6*  PLT 247 266 224  MCV 74.1* 73.1* 72.9*  MCH 24.2* 24.6* 24.4*  MCHC 32.7 33.6 33.4  RDW 18.6* 18.4* 18.2*  LYMPHSABS  --  1.7  --   MONOABS  --  1.9*  --   EOSABS  --  0.3  --   BASOSABS  --  0.1  --     Chemistries   Recent Labs Lab 06/22/15 1458 06/22/15 2100 06/23/15 0458 06/24/15 0420  NA 136  --  134* 134*  K 4.6  --  4.7 4.6  CL 105  --  105 103  CO2 22  --  23 23  GLUCOSE 160*  --  98 87  BUN 22*  --  26* 25*  CREATININE 2.73*  --  2.61* 2.09*  CALCIUM 8.7*  --  8.3* 8.1*  MG  --  2.0  --   --   AST 72*  --  60* 53*  ALT 24  --  20 16*  ALKPHOS 145*  --  117 82  BILITOT 1.0  --  1.1 1.1   ------------------------------------------------------------------------------------------------------------------ estimated creatinine clearance is 39.3 mL/min (by C-G formula based on Cr of 2.09). ------------------------------------------------------------------------------------------------------------------  Recent Labs  06/22/15 2100  HGBA1C 8.4*    ------------------------------------------------------------------------------------------------------------------ No results for  input(s): CHOL, HDL, LDLCALC, TRIG, CHOLHDL, LDLDIRECT in the last 72 hours. ------------------------------------------------------------------------------------------------------------------ No results for input(s): TSH, T4TOTAL, T3FREE, THYROIDAB in the last 72 hours.  Invalid input(s): FREET3 ------------------------------------------------------------------------------------------------------------------ No results for input(s): VITAMINB12, FOLATE, FERRITIN, TIBC, IRON, RETICCTPCT in the last 72 hours.  Coagulation profile  Recent Labs Lab 06/22/15 1458 06/23/15 0458 06/24/15 0420  INR 1.43 1.60* 1.57*    No results for input(s): DDIMER in the last 72 hours.  Cardiac Enzymes No results for input(s): CKMB, TROPONINI, MYOGLOBIN in the last 168 hours.  Invalid input(s): CK ------------------------------------------------------------------------------------------------------------------ Invalid input(s): POCBNP   Recent Labs  06/23/15 1148 06/23/15 1555 06/23/15 2018 06/24/15 06/24/15 0411 06/24/15 0755  GLUCAP 76 136* 111* 124* 79 108*     RAI,RIPUDEEP M.D. [facility] 06/24/2015, 10:53 AM  Pager: [phone] Between 7am to 7pm - call Pager - [phone]  After 7pm go to [email or web address] - password [email or web address]  Call night coverage person covering after 7pm

## 2015-06-24 NOTE — Progress Notes (Signed)
Pts IV is painful and leaking. IV removed. Pt requesting to not have a new IV. Paged Dr Isidoro Donningai. Waiting to hear back from Dr Isidoro Donningai.

## 2015-06-24 NOTE — Progress Notes (Signed)
On-call provider made aware of BP 87/54 mm of hg. Pt is asymptomatic. pt is asking for pain meds for leg pain. Awaiting orders. [id number] order received for po tramadol 50 mg x1. Will monitor.

## 2015-06-25 LAB — BASIC METABOLIC PANEL
Anion gap: 5 (ref 5–15)
Anion gap: 6 (ref 5–15)
BUN: 20 mg/dL (ref 6–20)
BUN: 20 mg/dL (ref 6–20)
CALCIUM: 8.3 mg/dL — AB (ref 8.9–10.3)
CALCIUM: 8.4 mg/dL — AB (ref 8.9–10.3)
CO2: 27 mmol/L (ref 22–32)
CO2: 28 mmol/L (ref 22–32)
CREATININE: 1.61 mg/dL — AB (ref 0.61–1.24)
Chloride: 101 mmol/L (ref 101–111)
Chloride: 100 mmol/L — ABNORMAL LOW (ref 101–111)
Creatinine, Ser: 1.63 mg/dL — ABNORMAL HIGH (ref 0.61–1.24)
GFR calc Af Amer: 51 mL/min — ABNORMAL LOW (ref >60)
GFR calc non Af Amer: 45 mL/min — ABNORMAL LOW (ref >60)
GFR, EST AFRICAN AMERICAN: 52 mL/min — AB (ref >60)
GFR, EST NON AFRICAN AMERICAN: 44 mL/min — AB (ref >60)
GLUCOSE: 115 mg/dL — AB (ref 65–99)
Glucose, Bld: 117 mg/dL — ABNORMAL HIGH (ref 65–99)
POTASSIUM: 4.7 mmol/L (ref 3.5–5.1)
Potassium: 4.7 mmol/L (ref 3.5–5.1)
SODIUM: 133 mmol/L — AB (ref 135–145)
Sodium: 134 mmol/L — ABNORMAL LOW (ref 135–145)

## 2015-06-25 LAB — GLUCOSE, CAPILLARY
GLUCOSE-CAPILLARY: 142 mg/dL — AB (ref 65–99)
GLUCOSE-CAPILLARY: 121 mg/dL — AB (ref 65–99)
GLUCOSE-CAPILLARY: 143 mg/dL — AB (ref 65–99)
Glucose-Capillary: 142 mg/dL — ABNORMAL HIGH (ref 65–99)
Glucose-Capillary: 151 mg/dL — ABNORMAL HIGH (ref 65–99)

## 2015-06-25 LAB — PATHOLOGIST SMEAR REVIEW

## 2015-06-25 MED ORDER — DIPHENHYDRAMINE HCL 12.5 MG/5ML PO ELIX
12.5000 mg | ORAL_SOLUTION | Freq: Four times a day (QID) | ORAL | Status: DC | PRN
  Administered 2015-06-25: 12.5 mg via ORAL
  Filled 2015-06-25 (×2): qty 5

## 2015-06-25 MED ORDER — OXYCODONE HCL 5 MG PO TABS
5.0000 mg | ORAL_TABLET | Freq: Four times a day (QID) | ORAL | Status: DC | PRN
  Administered 2015-06-25: 5 mg via ORAL
  Filled 2015-06-25: qty 1

## 2015-06-25 MED ORDER — SPIRONOLACTONE 25 MG PO TABS
25.0000 mg | ORAL_TABLET | Freq: Every day | ORAL | Status: DC
  Administered 2015-06-25: 25 mg via ORAL
  Filled 2015-06-25 (×2): qty 1

## 2015-06-25 MED ORDER — FUROSEMIDE 40 MG PO TABS
40.0000 mg | ORAL_TABLET | Freq: Two times a day (BID) | ORAL | Status: DC
  Administered 2015-06-25 (×2): 40 mg via ORAL
  Filled 2015-06-25 (×3): qty 1

## 2015-06-25 NOTE — Progress Notes (Signed)
[facility]                                                                              Patient Demographics  Curtis Estrada, is a 59 y.o. male, DOB - April 05, 1956, LKG:[id number]  Admit date - 06/22/2015   Admitting Physician Briscoe Deutscher, MD  Outpatient Primary MD for the patient is [facility]  LOS - 3   Chief Complaint  Patient presents with  . Ascites       Brief HPI   Curtis Estrada is a 59 y.o. male with PMH of hypertension, hyperlipidemia, insulin-dependent diabetes mellitus, chronic diastolic CHF, and liver cirrhosis secondary to chronic hepatitis C and alcohol abuse who presented to the ED with abdominal pain and swelling, found to be in acute renal failure. Curtis Estrada reported recurrent ascites over the past year with therapeutic paracentesis approximately every week. Last paracentesis was approximately 8 days Prior to admission at the [facility], where 7 L of fluid were removed. He presented with increasing abdominal distention, dyspnea and pain. He does have a history of SBP, not on prophylaxis, but denying in the infectious symptoms. In ED, patient was found to have tachycardia to the low 100s, his usual marginal blood pressures, and no apparent respiratory distress or fevers. Basic lab work was obtained and most notable for a serum creatinine of 2.7, up from 1.0 just 1 month prior. Patient denied any recent illness or change in his medications, but does endorse less frequent urination and darkening of his urine. He also endorsed increased fatigue and loss of appetite over the last week. His INR was 1.43 and an and MELD score of 21 this admission. 2 view chest x-ray was negative for pulmonary edema or vascular congestion. Patient was admitted for further workup.     Assessment & Plan    Principal Problem:  Ascites in the setting of chronic diastolic CHF, liver cirrhosis, chronic hepatitis C - Creatinine 2.73 at the time of admission, was  1.02 on 11/7, improving to 1.6 - Ultrasound-guided therapeutic and diagnostic paracentesis, followed by albumin 50 g 1, 8 L removed on 12/10. Labs not consistent with SBP, discontinued IV Rocephin - Restart Lasix, Aldactone. BP improving  - Ascites worsening, if no significant improvement tomorrow, will attempt another paracentesis   Active Problems:  Coagulopathy (HCC) - Due to liver cirrhosis and chronic hepatitis C, follow closely  Acute kidney injury: Unclear etiology at this time, possible hepatorenal syndrome - Creatinine 2.73 at the time of admission, was 1.02 on 11/7 - improving   Dyspnea, Bilateral edema of lower extremity: Likely due to #1   DM (diabetes mellitus) with complications (HCC) - CBG stable  Hypotension with history of hypertension -BP better, restart Lasix and Aldactone  History of hepatic encephalopathy - Continue rifaximin, lactulose, currently alert and oriented  Acute bronchitis - Placed on scheduled nebs  Code Status: FC  Family Communication: Discussed in detail with the patient, all imaging results, lab results explained to the patient    Disposition Plan: possible 24-48hrs   Time Spent in minutes 25 minutes  Procedures  paracentesis  Consults   GI  was consulted in ed  DVT Prophylaxis  heparin  Medications  Scheduled Meds: . folic acid  1 mg Oral Daily  . furosemide  40 mg Oral BID  . gabapentin  600 mg Oral TID  . heparin  5,000 Units Subcutaneous 3 times per day  . insulin aspart  0-9 Units Subcutaneous 6 times per day  . insulin glargine  10 Units Subcutaneous QHS  . lactulose  10 g Oral BID  . magnesium oxide  400 mg Oral Daily  . multivitamin with minerals  1 tablet Oral Daily  . rifaximin  550 mg Oral BID  . sodium chloride  3 mL Intravenous Q12H  . spironolactone  25 mg Oral Daily   Continuous Infusions:  PRN Meds:.albuterol, alum & mag hydroxide-simeth, benzonatate, ipratropium-albuterol, ondansetron,  oxyCODONE   Antibiotics   Anti-infectives    Start     Dose/Rate Route Frequency Ordered Stop   06/23/15 0900  cefTRIAXone (ROCEPHIN) 2 g in dextrose 5 % 50 mL IVPB  Status:  Discontinued     2 g 100 mL/hr over 30 Minutes Intravenous Every 24 hours 06/23/15 0811 06/24/15 1052   06/22/15 2200  rifaximin (XIFAXAN) tablet 550 mg     550 mg Oral 2 times daily 06/22/15 2002     06/22/15 2015  sulfamethoxazole-trimethoprim (BACTRIM DS,SEPTRA DS) 800-160 MG per tablet 1 tablet  Status:  Discontinued     1 tablet Oral Daily 06/22/15 2004 06/23/15 0810        Subjective:   Curtis Estrada was seen and examined today.  Feels abdominal distention is returning back, BP better.  Patient denies dizziness, chest pain, N/V/D/C, new weakness, numbess, tingling. No acute events overnight.    Objective:   Blood pressure 114/78, pulse 75, temperature 98.9 F (37.2 C), temperature source Oral, resp. rate 16, height  (1.702 m), weight 89.9 kg (198 lb 3.1 oz), SpO2 95 %.  Wt Readings from Last 3 Encounters:  06/25/15 89.9 kg (198 lb 3.1 oz)  06/04/15 92.137 kg (203 lb 2 oz)  04/30/15 75.1 kg (165 lb 9.1 oz)     Intake/Output Summary (Last 24 hours) at 06/25/15 1235 Last data filed at 06/25/15 1233  Gross per 24 hour  Intake   1520 ml  Output    300 ml  Net   1220 ml    Exam  General: Alert and oriented x 3, NAD  HEENT:  PERRLA, EOMI  Neck: Supple, no JVD, no masses  CVS: S1 S2 clear, RRR  Respiratory: CTAB  Abdomen: Soft, ++ distended, + bowel sounds  Ext: no cyanosis clubbing, 2+ edema  Neuro: no new deficits  Skin: No rashes  Psych: Normal affect and demeanor, alert and oriented x3    Data Review   Micro Results Recent Results (from the past 240 hour(s))  Culture, body fluid-bottle     Status: None (Preliminary result)   Collection Time: 06/23/15  9:26 AM  Result Value Ref Range Status   Specimen Description PARACENTESIS ABDOMEN FLUID  Final   Special Requests  BAA  Final   Culture NO GROWTH 1 DAY  Final   Report Status PENDING  Incomplete  Gram stain     Status: None   Collection Time: 06/23/15  9:26 AM  Result Value Ref Range Status   Specimen Description PARACENTESIS ABDOMEN FLUID  Final   Special Requests NONE  Final   Gram Stain   Final    MODERATE WBC PRESENT, PREDOMINANTLY MONONUCLEAR NO  ORGANISMS SEEN    Report Status 06/23/2015 FINAL  Final    Radiology Reports Dg Chest 2 View  06/22/2015  CLINICAL DATA:  Abdominal swelling, cirrhosis, multiple abdominal drains EXAM: CHEST  2 VIEW COMPARISON:  05/21/2015 FINDINGS: Study is limited by poor inspiration. Cardiomediastinal silhouette is stable. Mild left basilar atelectasis or scarring again noted. No infiltrate or pulmonary edema. IMPRESSION: Mild left basilar atelectasis or scarring again noted. No infiltrate or pulmonary edema. Electronically Signed   By: Natasha Mead M.D.   On: 06/22/2015 18:46   US Renal  06/22/2015  CLINICAL DATA:  Acute renal failure EXAM: RENAL / URINARY TRACT ULTRASOUND COMPLETE COMPARISON:  None. FINDINGS: Right Kidney: Length: 11.9 cm. Echogenicity within normal limits. No mass or hydronephrosis visualized. Left Kidney: Length: 10 cm. Echogenicity within normal limits. No mass or hydronephrosis visualized. Bladder: Appears normal for degree of bladder distention. Bilateral ureteral jets are visualized. Abundant abdominal ascites. IMPRESSION: 1. No hydronephrosis. No renal calculus. Abundant abdominal ascites. Bilateral ureteral jets are visualized within bladder. Electronically Signed   By: Natasha Mead M.D.   On: 06/22/2015 21:37   US Paracentesis  06/23/2015  CLINICAL DATA:  Ascites EXAM: ULTRASOUND GUIDED PARACENTESIS COMPARISON:  06/22/2015 PROCEDURE: An ultrasound guided paracentesis was thoroughly discussed with the patient and questions answered. The benefits, risks, alternatives and complications were also discussed. The patient understands and wishes to  proceed with the procedure. Written consent was obtained. Ultrasound was performed to localize and mark an adequate pocket of fluid in the right lower quadrant of the abdomen. The area was then prepped and draped in the normal sterile fashion. 1% Lidocaine was used for local anesthesia. Under ultrasound guidance a safety centesis needle catheter was introduced. Paracentesis was performed. The catheter was removed and a dressing applied. COMPLICATIONS: None immediate FINDINGS: A total of approximately 8 L of cloudy exudative peritoneal fluid was removed. A fluid sample was sent for laboratory analysis. IMPRESSION: Successful ultrasound guided paracentesis yielding 8 L of ascites. Electronically Signed   By: Judie Petit.  Shick M.D.   On: 06/23/2015 10:41    CBC  Recent Labs Lab 06/22/15 1458 06/23/15 0458 06/24/15 0420  WBC 7.5 8.8 6.6  HGB 13.3 12.6* 10.9*  HCT 40.7 37.5* 32.6*  PLT 247 266 224  MCV 74.1* 73.1* 72.9*  MCH 24.2* 24.6* 24.4*  MCHC 32.7 33.6 33.4  RDW 18.6* 18.4* 18.2*  LYMPHSABS  --  1.7  --   MONOABS  --  1.9*  --   EOSABS  --  0.3  --   BASOSABS  --  0.1  --     Chemistries   Recent Labs Lab 06/22/15 1458 06/22/15 2100 06/23/15 0458 06/24/15 0420 06/25/15 0905 06/25/15 0948  NA 136  --  134* 134* 133* 134*  K 4.6  --  4.7 4.6 4.7 4.7  CL 105  --  105 103 101 100*  CO2 22  --  23 23 27 28   GLUCOSE 160*  --  98 87 117* 115*  BUN 22*  --  26* 25* 20 20  CREATININE 2.73*  --  2.61* 2.09* 1.61* 1.63*  CALCIUM 8.7*  --  8.3* 8.1* 8.3* 8.4*  MG  --  2.0  --   --   --   --   AST 72*  --  60* 53*  --   --   ALT 24  --  20 16*  --   --   ALKPHOS 145*  --  117  82  --   --   BILITOT 1.0  --  1.1 1.1  --   --    ------------------------------------------------------------------------------------------------------------------ estimated creatinine clearance is 50.9 mL/min (by C-G formula based on Cr of  1.63). ------------------------------------------------------------------------------------------------------------------  Recent Labs  06/22/15 2100  HGBA1C 8.4*   ------------------------------------------------------------------------------------------------------------------ No results for input(s): CHOL, HDL, LDLCALC, TRIG, CHOLHDL, LDLDIRECT in the last 72 hours. ------------------------------------------------------------------------------------------------------------------ No results for input(s): TSH, T4TOTAL, T3FREE, THYROIDAB in the last 72 hours.  Invalid input(s): FREET3 ------------------------------------------------------------------------------------------------------------------ No results for input(s): VITAMINB12, FOLATE, FERRITIN, TIBC, IRON, RETICCTPCT in the last 72 hours.  Coagulation profile  Recent Labs Lab 06/22/15 1458 06/23/15 0458 06/24/15 0420  INR 1.43 1.60* 1.57*    No results for input(s): DDIMER in the last 72 hours.  Cardiac Enzymes No results for input(s): CKMB, TROPONINI, MYOGLOBIN in the last 168 hours.  Invalid input(s): CK ------------------------------------------------------------------------------------------------------------------ Invalid input(s): POCBNP   Recent Labs  06/24/15 1646 06/24/15 2001 06/24/15 2357 06/25/15 0411 06/25/15 0803 06/25/15 1152  GLUCAP 159* 151* 124* 142* 121* 142*     RAI,RIPUDEEP M.D. [facility] 06/25/2015, 12:35 PM  Pager: [phone] Between 7am to 7pm - call Pager - [phone]  After 7pm go to [email or web address] - password [email or web address]  Call night coverage person covering after 7pm

## 2015-06-25 NOTE — Care Management Note (Addendum)
Case Management Note  Patient Details  Name: Curtis Estrada MRN: [id number] Date of Birth: 05-21-1956  Subjective/Objective:                 Spoke with patient at the bedside. Patient admitted for ascites. [facility] [place] [facility] [address] [place] [address] [place]. Patient has HHA through [employer], takes the bus to appointments, and denies any prescription needs outside of Medicaid coverage. Patient has a walker.   Action/Plan:  Will continue to follow and [facility] needed. [facility]15 [facility]  Expected Discharge Date:                  Expected Discharge Plan:  Home/Self Care  In-House Referral:     Discharge planning Services  CM Consult  Post Acute Care Choice:    Choice offered to:     DME Arranged:    DME Agency:     HH Arranged:    HH Agency:     Status of Service:  In process, will continue to follow  Medicare Important Message Given:    Date Medicare IM Given:    Medicare IM give by:    Date Additional Medicare IM Given:    Additional Medicare Important Message give by:     If discussed at Long Length of Stay Meetings, dates discussed:    Additional Comments:  Lawerance SabalDebbie Virgel Haro, RN 06/25/2015, 3:59 PM

## 2015-06-26 ENCOUNTER — Inpatient Hospital Stay (HOSPITAL_COMMUNITY): Payer: Non-veteran care

## 2015-06-26 LAB — GLUCOSE, CAPILLARY
GLUCOSE-CAPILLARY: 107 mg/dL — AB (ref 65–99)
GLUCOSE-CAPILLARY: 130 mg/dL — AB (ref 65–99)
GLUCOSE-CAPILLARY: 118 mg/dL — AB (ref 65–99)
Glucose-Capillary: 112 mg/dL — ABNORMAL HIGH (ref 65–99)

## 2015-06-26 LAB — BASIC METABOLIC PANEL
Anion gap: 5 (ref 5–15)
BUN: 16 mg/dL (ref 6–20)
CO2: 26 mmol/L (ref 22–32)
CREATININE: 1.4 mg/dL — AB (ref 0.61–1.24)
Calcium: 8.1 mg/dL — ABNORMAL LOW (ref 8.9–10.3)
Chloride: 101 mmol/L (ref 101–111)
GFR, EST NON AFRICAN AMERICAN: 53 mL/min — AB (ref >60)
Glucose, Bld: 134 mg/dL — ABNORMAL HIGH (ref 65–99)
POTASSIUM: 4.6 mmol/L (ref 3.5–5.1)
SODIUM: 132 mmol/L — AB (ref 135–145)

## 2015-06-26 MED ORDER — SPIRONOLACTONE 25 MG PO TABS: 25.0000 mg | ORAL_TABLET | Freq: Every day | ORAL | Status: DC

## 2015-06-26 MED ORDER — TRAMADOL HCL 50 MG PO TABS
50.0000 mg | ORAL_TABLET | Freq: Four times a day (QID) | ORAL | Status: DC | PRN
  Administered 2015-06-26: 50 mg via ORAL
  Filled 2015-06-26: qty 1

## 2015-06-26 MED ORDER — TRAMADOL HCL 50 MG PO TABS: 50.0000 mg | ORAL_TABLET | Freq: Once | ORAL | Status: DC

## 2015-06-26 MED ORDER — TRAMADOL HCL 50 MG PO TABS
50.0000 mg | ORAL_TABLET | Freq: Three times a day (TID) | ORAL | Status: DC | PRN
Start: 2015-06-26 — End: 2015-07-17

## 2015-06-26 MED ORDER — FUROSEMIDE 40 MG PO TABS: 40.0000 mg | ORAL_TABLET | Freq: Two times a day (BID) | ORAL | Status: AC

## 2015-06-26 MED ORDER — DIPHENHYDRAMINE HCL 12.5 MG/5ML PO ELIX: 12.5000 mg | ORAL_SOLUTION | Freq: Four times a day (QID) | ORAL | Status: AC | PRN

## 2015-06-26 MED ORDER — ALBUMIN HUMAN 25 % IV SOLN
25.0000 g | Freq: Once | INTRAVENOUS | Status: AC
  Administered 2015-06-26: 25 g via INTRAVENOUS
  Filled 2015-06-26 (×2): qty 100

## 2015-06-26 MED ORDER — LIDOCAINE HCL (PF) 1 % IJ SOLN
INTRAMUSCULAR | Status: AC
  Filled 2015-06-26: qty 10

## 2015-06-26 NOTE — Progress Notes (Signed)
Pt's BP was running low, 87/65, 88/62, and wanted pain medicine. PRN Oxy IR was on file. Night on call MD notified. He ordered to take BP manually and call with result if less than 90 and also Tramadol for the pain. It was change of shift. AM nurse took the BP as 90/62. Report of care was given to AM nurse. Nursing staff will continue to monitor.

## 2015-06-26 NOTE — Discharge Summary (Signed)
Physician Discharge Summary   Patient ID: Curtis CarbonWillie Gayden MRN: [id number] DOB/AGE: 02-27-1956 59 y.o.  Admit date: 06/22/2015 Discharge date: 06/26/2015  [facility]  Discharge Diagnoses:    . Acute renal failure (ARF) (HCC) . Ascites status post paracentesis 2 this admission  . Bilateral edema of lower extremity . Coagulopathy (HCC) . DM (diabetes mellitus) with complications (HCC) . Dyspnea  Consults: None  Recommendations for Outpatient Follow-up:  1. Home health PT, RN were arranged 2. Please repeat CBC/BMET at next visit 3.  Propranolol is currently held as BP is still somewhat soft    DIET: Low-sodium diet   DISCHARGE MEDICATIONS: Current Discharge Medication List    START taking these medications   Details  diphenhydrAMINE (BENADRYL) 12.5 MG/5ML elixir Take 5 mLs (12.5 mg total) by mouth every 6 (six) hours as needed for itching. Qty: 120 mL, Refills: 0    traMADol (ULTRAM) 50 MG tablet Take 1 tablet (50 mg total) by mouth every 8 (eight) hours as needed for severe pain. Qty: 30 tablet, Refills: 0      CONTINUE these medications which have CHANGED   Details  furosemide (LASIX) 40 MG tablet Take 1 tablet (40 mg total) by mouth 2 (two) times daily. Qty: 30 tablet, Refills: 1    spironolactone (ALDACTONE) 25 MG tablet Take 1 tablet (25 mg total) by mouth daily.      CONTINUE these medications which have NOT CHANGED   Details  albuterol (PROVENTIL HFA;VENTOLIN HFA) 108 (90 BASE) MCG/ACT inhaler Inhale 2 puffs into the lungs every 2 (two) hours as needed for wheezing or shortness of breath (or coughing). Qty: 1 Inhaler, Refills: 0    folic acid (FOLVITE) 1 MG tablet Take 1 tablet (1 mg total) by mouth daily. Qty: 30 tablet, Refills: 2    gabapentin (NEURONTIN) 300 MG capsule Take 600 mg by mouth 3 (three) times daily.    HYDROCODONE-ACETAMINOPHEN PO Take 1 tablet by mouth daily as needed (pain).    insulin aspart (NOVOLOG)  100 UNIT/ML injection Inject 0-9 Units into the skin 4 (four) times daily. 0-6 Units, Subcutaneous, 3 times daily with meals  CBG < 70: implement hypoglycemia protocol; CBG 70 - 120: 0 units; CBG 121 - 150: 1 unit; CBG 151 - 200: 2 units; CBG 201 - 250: 3 units; CBG 251 - 300: 5 units; CBG 301 - 350: 7 units; CBG 351 - 400: 9 units; CBG > 400: call MD    insulin detemir (LEVEMIR) 100 UNIT/ML injection Inject 0.1 mLs (10 Units total) into the skin at bedtime. Qty: 10 mL, Refills: 0    lactulose (CHRONULAC) 10 GM/15ML solution Take 15 mLs (10 g total) by mouth 2 (two) times daily. Qty: 240 mL, Refills: 0    Magnesium Oxide 420 MG TABS Take 1 tablet by mouth daily.    Multiple Vitamin (MULTIVITAMIN WITH MINERALS) TABS tablet Take 1 tablet by mouth daily. Qty: 30 tablet, Refills: 2    pantoprazole (PROTONIX) 40 MG tablet Take 40 mg by mouth 2 (two) times daily.     rifaximin (XIFAXAN) 550 MG TABS tablet Take 1 tablet (550 mg total) by mouth 2 (two) times daily. Qty: 60 tablet, Refills: 0    benzonatate (TESSALON) 200 MG capsule Take 1 capsule (200 mg total) by mouth 2 (two) times daily as needed for cough. Qty: 20 capsule, Refills: 0      STOP taking these medications     polyvinyl alcohol (LIQUIFILM TEARS)  1.4 % ophthalmic solution      propranolol (INDERAL) 10 MG tablet          Brief H and P: For complete details please refer to admission H and P, but in brief, [name] is a 59 y.o. male with PMH of hypertension, hyperlipidemia, insulin-dependent diabetes mellitus, chronic diastolic CHF, and liver cirrhosis secondary to chronic hepatitis C and alcohol abuse who presented to the ED with abdominal pain and swelling, found to be in acute renal failure. Mr. Kelnhofer reported recurrent ascites over the past year with therapeutic paracentesis approximately every week. Last paracentesis was approximately 8 days Prior to admission at the [facility], where 7 L of fluid were  removed. He presented with increasing abdominal distention, dyspnea and pain. He does have a history of SBP, not on prophylaxis, but denying in the infectious symptoms. In ED, patient was found to have tachycardia to the low 100s, his usual marginal blood pressures, and no apparent respiratory distress or fevers. Basic lab work was obtained and most notable for a serum creatinine of 2.7, up from 1.0 just 1 month prior. Patient denied any recent illness or change in his medications, but does endorse less frequent urination and darkening of his urine. He also endorsed increased fatigue and loss of appetite over the last week. His INR was 1.43 and an and MELD score of 21 this admission. 2 view chest x-ray was negative for pulmonary edema or vascular congestion. Patient was admitted for further workup.   Hospital Course:   Ascites in the setting of chronic diastolic CHF, liver cirrhosis, chronic hepatitis C - Creatinine 2.73 at the time of admission, was 1.02 on 11/7, improving to 1.4 at the time of discharge. - Patient underwent Ultrasound-guided therapeutic and diagnostic paracentesis, followed by albumin 50 g 1, 8 L removed on 12/10, second paracentesis done on 12/13, 8.3 L removed. Labs not consistent with SBP, hence discontinued IV Rocephin. - Restart Lasix, Aldactone.    Coagulopathy (HCC) - Due to liver cirrhosis and chronic hepatitis C  Acute kidney injury: Unclear etiology at this time, possible hepatorenal syndrome versus due to diuretics - Creatinine 2.73 at the time of admission, was 1.02 on 11/7, Lasix and Aldactone were held - Creatinine has improved to 1.4, patient may resume diuretics.   Dyspnea, Bilateral edema of lower extremity: Likely due to #1   DM (diabetes mellitus) with complications (HCC) - CBG stable  Hypotension with history of hypertension -BP better, restart Lasix and Aldactone  History of hepatic encephalopathy - Continue rifaximin, lactulose, currently alert  and oriented   Day of Discharge BP 104/74 mmHg  Pulse 113  Temp(Src) 98 F (36.7 C) (Oral)  Resp 16  Ht  (1.702 m)  Wt 89.9 kg (198 lb 3.1 oz)  BMI 31.03 kg/m2  SpO2 96%  Physical Exam: General: Alert and awake oriented x3 not in any acute distress. HEENT: anicteric sclera, pupils reactive to light and accommodation CVS: S1-S2 clear no murmur rubs or gallops Chest: clear to auscultation bilaterally, no wheezing rales or rhonchi Abdomen: soft nontender, +distended, normal bowel sounds Extremities: no cyanosis, clubbing, 2+ edema noted bilaterally Neuro: Cranial nerves II-XII intact, no focal neurological deficits   The results of significant diagnostics from this hospitalization (including imaging, microbiology, ancillary and laboratory) are listed below for reference.    LAB RESULTS: Basic Metabolic Panel:  Recent Labs Lab 06/22/15 2100  06/25/15 0948 06/26/15 0700  NA  --   < > 134* 132*  K  --   < > 4.7 4.6  CL  --   < > 100* 101  CO2  --   < > 28 26  GLUCOSE  --   < > 115* 134*  BUN  --   < > 20 16  CREATININE  --   < > 1.63* 1.40*  CALCIUM  --   < > 8.4* 8.1*  MG 2.0  --   --   --   < > = values in this interval not displayed. Liver Function Tests:  Recent Labs Lab 06/23/15 0458 06/24/15 0420  AST 60* 53*  ALT 20 16*  ALKPHOS 117 82  BILITOT 1.1 1.1  PROT 5.3* 4.9*  ALBUMIN 1.7* 1.9*    Recent Labs Lab 06/22/15 1458  LIPASE 38   No results for input(s): AMMONIA in the last 168 hours. CBC:  Recent Labs Lab 06/23/15 0458 06/24/15 0420  WBC 8.8 6.6  NEUTROABS 4.9  --   HGB 12.6* 10.9*  HCT 37.5* 32.6*  MCV 73.1* 72.9*  PLT 266 224   Cardiac Enzymes: No results for input(s): CKTOTAL, CKMB, CKMBINDEX, TROPONINI in the last 168 hours. BNP: Invalid input(s): POCBNP CBG:  Recent Labs Lab 06/26/15 0431 06/26/15 0750  GLUCAP 107* 130*    Significant Diagnostic Studies:  Dg Chest 2 View  06/22/2015  CLINICAL DATA:  Abdominal  swelling, cirrhosis, multiple abdominal drains EXAM: CHEST  2 VIEW COMPARISON:  05/21/2015 FINDINGS: Study is limited by poor inspiration. Cardiomediastinal silhouette is stable. Mild left basilar atelectasis or scarring again noted. No infiltrate or pulmonary edema. IMPRESSION: Mild left basilar atelectasis or scarring again noted. No infiltrate or pulmonary edema. Electronically Signed   By: Natasha Mead M.D.   On: 06/22/2015 18:46   US Renal  06/22/2015  CLINICAL DATA:  Acute renal failure EXAM: RENAL / URINARY TRACT ULTRASOUND COMPLETE COMPARISON:  None. FINDINGS: Right Kidney: Length: 11.9 cm. Echogenicity within normal limits. No mass or hydronephrosis visualized. Left Kidney: Length: 10 cm. Echogenicity within normal limits. No mass or hydronephrosis visualized. Bladder: Appears normal for degree of bladder distention. Bilateral ureteral jets are visualized. Abundant abdominal ascites. IMPRESSION: 1. No hydronephrosis. No renal calculus. Abundant abdominal ascites. Bilateral ureteral jets are visualized within bladder. Electronically Signed   By: Natasha Mead M.D.   On: 06/22/2015 21:37   US Paracentesis  06/23/2015  CLINICAL DATA:  Ascites EXAM: ULTRASOUND GUIDED PARACENTESIS COMPARISON:  06/22/2015 PROCEDURE: An ultrasound guided paracentesis was thoroughly discussed with the patient and questions answered. The benefits, risks, alternatives and complications were also discussed. The patient understands and wishes to proceed with the procedure. Written consent was obtained. Ultrasound was performed to localize and mark an adequate pocket of fluid in the right lower quadrant of the abdomen. The area was then prepped and draped in the normal sterile fashion. 1% Lidocaine was used for local anesthesia. Under ultrasound guidance a safety centesis needle catheter was introduced. Paracentesis was performed. The catheter was removed and a dressing applied. COMPLICATIONS: None immediate FINDINGS: A total of  approximately 8 L of cloudy exudative peritoneal fluid was removed. A fluid sample was sent for laboratory analysis. IMPRESSION: Successful ultrasound guided paracentesis yielding 8 L of ascites. Electronically Signed   By: Judie Petit.  Shick M.D.   On: 06/23/2015 10:41    2D ECHO:   Disposition and Follow-up: Discharge Instructions    Discharge instructions    Complete by:  As directed   Please hold propranolol until SBP is more  stable and consistently above 110            DISPOSITION: Home with home health PT, RN   DISCHARGE FOLLOW-UP Follow-up Information    Follow up with [facility]. Schedule an appointment as soon as possible for a visit in 10 days.   Specialty:  General Practice   Why:  for hospital follow-up   Contact information:   [address] [place] [address] [phone]        Time spent on Discharge: 25 minutes   Signed:   RAI,RIPUDEEP M.D. [facility] 06/26/2015, 12:25 PM Pager: [phone]

## 2015-06-26 NOTE — Progress Notes (Signed)
RN spoke with Pharmacist regarding administration of albumin. RN called for a 1.2 micron filter, Pharmacist Len ChildsMichelle Bell stated that filter is not needed since albumin manufactured by [employer].

## 2015-06-26 NOTE — Procedures (Signed)
  US guided LLQ paracentesis  8.3 liters- cloudy yellow fluid Pt tolerated well

## 2015-06-26 NOTE — Progress Notes (Signed)
RN paged MD and asked if MD would like for Lasix and Spirolactone to be held due to b/p of 90/62. MD stated in progression okay to hold meds. Tobin Chadracy Omid Deardorff, RN 06/26/2015 1006

## 2015-06-26 NOTE — Progress Notes (Signed)
RN reviewed and given discharge paperwork with patient. Teach back received. No questions or concerns voiced with asked. IV site removed with tip intact. Dressing in place, pressure held for 3 minutes. No signs or symptoms of bleeding noted after. Pt call family member to pick him up. Pt in room getting dressed at this time. RN informed pt to call out for wheelchair when he is ready to leave. Pt verbalized understanding. Pt sitting on side of bed at this time. Call bell within reach. Bed low and locked. Will continue to monitor pt until discharge out of facility.

## 2015-06-26 NOTE — Progress Notes (Signed)
Pharmacist Provided - Patient Medication Education Prior to Discharge   Curtis Estrada is an 59 y.o. male who [facility] with a chief complaint of  Chief Complaint  Patient presents with  . Ascites     The following medications were discussed with the patient:  Pain Control medications: []  Yes    [x]  No  Diabetes Medications: [x]  Yes    []  No  Heart Failure Medications: []  Yes    [x]  No  Anticoagulation Medications:  []  Yes    [x]  No  Blood pressure medications: [x]  Yes    []  No  Allergy Assessment Completed and Updated: [x]  Yes    []  No Identified Patient Allergies:  Allergies  Allergen Reactions  . Penicillins      Medication Adherence Assessment: []  Excellent (no doses missed/week)      [x]  Good (1 dose missed/week)      []  Partial (2-3 doses missed/week)      []  Poor (>3 doses missed/week)  Barriers to Obtaining Medications: []  Yes [x]  No  Curtis CarbonWillie Bloom expressed no concern obtaining medications as [facility].    Assessment: 59 y/o M with ascities. Discussed with patient new medications of discharge and plans to hold propranolol due to blood pressure. Spoke to patient about the signs and symptoms of low blood pressure. Also spoke with patient about his diabetes. Pt endorses good control and is well educated. Reinforced diabetes teaching points.    Time spent preparing for discharge counseling: 15 min Time spent counseling patient: 20 min  Sandi CarneNick Inigo Lantigua, PharmD Pharmacy Resident Pager: [phone] 06/26/2015, 3:37 PM

## 2015-06-26 NOTE — Progress Notes (Signed)
MD made aware of pt's b/p 90/62 and requesting pain medication. RN ask MD for pain medication order if pt is able to have pain medication with that b/p. MD place order for Tramadol.

## 2015-06-28 LAB — CULTURE, BODY FLUID W GRAM STAIN -BOTTLE

## 2015-06-28 LAB — CULTURE, BODY FLUID-BOTTLE: CULTURE: NO GROWTH

## 2015-06-28 NOTE — Progress Notes (Signed)
[facility] [phone]

## 2015-07-14 ENCOUNTER — Observation Stay (HOSPITAL_COMMUNITY)
Admission: EM | Admit: 2015-07-14 | Discharge: 2015-07-17 | Disposition: A | Discharge status: Home / Self Care | Payer: Non-veteran care | Attending: Family Medicine | Admitting: Family Medicine

## 2015-07-14 ENCOUNTER — Encounter (HOSPITAL_COMMUNITY): Payer: Self-pay | Admitting: Emergency Medicine

## 2015-07-14 DIAGNOSIS — E1165 Type 2 diabetes mellitus with hyperglycemia: Secondary | ICD-10-CM | POA: Diagnosis not present

## 2015-07-14 DIAGNOSIS — R188 Other ascites: Secondary | ICD-10-CM | POA: Diagnosis present

## 2015-07-14 DIAGNOSIS — Z515 Encounter for palliative care: Secondary | ICD-10-CM | POA: Diagnosis not present

## 2015-07-14 DIAGNOSIS — B1920 Unspecified viral hepatitis C without hepatic coma: Secondary | ICD-10-CM | POA: Diagnosis not present

## 2015-07-14 DIAGNOSIS — E785 Hyperlipidemia, unspecified: Secondary | ICD-10-CM | POA: Insufficient documentation

## 2015-07-14 DIAGNOSIS — K7469 Other cirrhosis of liver: Secondary | ICD-10-CM

## 2015-07-14 DIAGNOSIS — I129 Hypertensive chronic kidney disease with stage 1 through stage 4 chronic kidney disease, or unspecified chronic kidney disease: Secondary | ICD-10-CM | POA: Insufficient documentation

## 2015-07-14 DIAGNOSIS — N189 Chronic kidney disease, unspecified: Secondary | ICD-10-CM | POA: Insufficient documentation

## 2015-07-14 DIAGNOSIS — I959 Hypotension, unspecified: Secondary | ICD-10-CM | POA: Insufficient documentation

## 2015-07-14 DIAGNOSIS — F17210 Nicotine dependence, cigarettes, uncomplicated: Secondary | ICD-10-CM | POA: Diagnosis not present

## 2015-07-14 DIAGNOSIS — Z7189 Other specified counseling: Secondary | ICD-10-CM

## 2015-07-14 DIAGNOSIS — I10 Essential (primary) hypertension: Secondary | ICD-10-CM | POA: Diagnosis not present

## 2015-07-14 DIAGNOSIS — E875 Hyperkalemia: Secondary | ICD-10-CM | POA: Diagnosis present

## 2015-07-14 DIAGNOSIS — R109 Unspecified abdominal pain: Secondary | ICD-10-CM | POA: Diagnosis not present

## 2015-07-14 DIAGNOSIS — Z794 Long term (current) use of insulin: Secondary | ICD-10-CM | POA: Diagnosis not present

## 2015-07-14 DIAGNOSIS — E1122 Type 2 diabetes mellitus with diabetic chronic kidney disease: Secondary | ICD-10-CM | POA: Diagnosis not present

## 2015-07-14 DIAGNOSIS — K7031 Alcoholic cirrhosis of liver with ascites: Principal | ICD-10-CM | POA: Insufficient documentation

## 2015-07-14 DIAGNOSIS — IMO0002 Reserved for concepts with insufficient information to code with codable children: Secondary | ICD-10-CM | POA: Diagnosis present

## 2015-07-14 DIAGNOSIS — K219 Gastro-esophageal reflux disease without esophagitis: Secondary | ICD-10-CM | POA: Insufficient documentation

## 2015-07-14 DIAGNOSIS — N179 Acute kidney failure, unspecified: Secondary | ICD-10-CM | POA: Diagnosis not present

## 2015-07-14 DIAGNOSIS — F1010 Alcohol abuse, uncomplicated: Secondary | ICD-10-CM | POA: Diagnosis not present

## 2015-07-14 DIAGNOSIS — E118 Type 2 diabetes mellitus with unspecified complications: Secondary | ICD-10-CM | POA: Diagnosis not present

## 2015-07-14 DIAGNOSIS — Z79899 Other long term (current) drug therapy: Secondary | ICD-10-CM | POA: Diagnosis not present

## 2015-07-14 DIAGNOSIS — F1011 Alcohol abuse, in remission: Secondary | ICD-10-CM | POA: Diagnosis present

## 2015-07-14 LAB — COMPREHENSIVE METABOLIC PANEL
ALT: 24 U/L (ref 17–63)
AST: 70 U/L — ABNORMAL HIGH (ref 15–41)
Albumin: 2.2 g/dL — ABNORMAL LOW (ref 3.5–5.0)
Alkaline Phosphatase: 115 U/L (ref 38–126)
Anion gap: 11 (ref 5–15)
BUN: 44 mg/dL — ABNORMAL HIGH (ref 6–20)
CO2: 24 mmol/L (ref 22–32)
Calcium: 8.8 mg/dL — ABNORMAL LOW (ref 8.9–10.3)
Chloride: 102 mmol/L (ref 101–111)
Creatinine, Ser: 4.35 mg/dL — ABNORMAL HIGH (ref 0.61–1.24)
GFR calc Af Amer: 16 mL/min — ABNORMAL LOW (ref >60)
GFR calc non Af Amer: 13 mL/min — ABNORMAL LOW (ref >60)
Glucose, Bld: 129 mg/dL — ABNORMAL HIGH (ref 65–99)
Potassium: 5.9 mmol/L — ABNORMAL HIGH (ref 3.5–5.1)
Sodium: 137 mmol/L (ref 135–145)
Total Bilirubin: 1.2 mg/dL (ref 0.3–1.2)
Total Protein: 6.4 g/dL — ABNORMAL LOW (ref 6.5–8.1)

## 2015-07-14 LAB — BODY FLUID CELL COUNT WITH DIFFERENTIAL
Eos, Fluid: 0 %
Lymphs, Fluid: 94 %
Monocyte-Macrophage-Serous Fluid: 4 % — ABNORMAL LOW (ref 50–90)
Neutrophil Count, Fluid: 2 % (ref 0–25)
Total Nucleated Cell Count, Fluid: 146 cu mm (ref 0–1000)

## 2015-07-14 LAB — CBC
HCT: 42.3 % (ref 39.0–52.0)
Hemoglobin: 14.2 g/dL (ref 13.0–17.0)
MCH: 25 pg — ABNORMAL LOW (ref 26.0–34.0)
MCHC: 33.6 g/dL (ref 30.0–36.0)
MCV: 74.3 fL — ABNORMAL LOW (ref 78.0–100.0)
Platelets: 297 10*3/uL (ref 150–400)
RBC: 5.69 MIL/uL (ref 4.22–5.81)
RDW: 19.3 % — ABNORMAL HIGH (ref 11.5–15.5)
WBC: 10.5 10*3/uL (ref 4.0–10.5)

## 2015-07-14 LAB — GLUCOSE, CAPILLARY: Glucose-Capillary: 159 mg/dL — ABNORMAL HIGH (ref 65–99)

## 2015-07-14 LAB — PROTIME-INR
INR: 1.43 (ref 0.00–1.49)
Prothrombin Time: 17.5 seconds — ABNORMAL HIGH (ref 11.6–15.2)

## 2015-07-14 LAB — LIPASE, BLOOD: Lipase: 64 U/L — ABNORMAL HIGH (ref 11–51)

## 2015-07-14 LAB — APTT: aPTT: 40 seconds — ABNORMAL HIGH (ref 24–37)

## 2015-07-14 MED ORDER — ALBUMIN HUMAN 25 % IV SOLN
50.0000 g | Freq: Once | INTRAVENOUS | Status: AC
  Administered 2015-07-14: 50 g via INTRAVENOUS
  Filled 2015-07-14: qty 200

## 2015-07-14 MED ORDER — SODIUM BICARBONATE 8.4 % IV SOLN
50.0000 meq | Freq: Once | INTRAVENOUS | Status: AC
  Administered 2015-07-14: 50 meq via INTRAVENOUS
  Filled 2015-07-14: qty 50

## 2015-07-14 MED ORDER — GABAPENTIN 300 MG PO CAPS
600.0000 mg | ORAL_CAPSULE | Freq: Three times a day (TID) | ORAL | Status: DC
  Administered 2015-07-14 – 2015-07-17 (×10): 600 mg via ORAL
  Filled 2015-07-14 (×10): qty 2

## 2015-07-14 MED ORDER — LACTULOSE 10 GM/15ML PO SOLN
10.0000 g | Freq: Two times a day (BID) | ORAL | Status: DC
  Administered 2015-07-14 – 2015-07-16 (×5): 10 g via ORAL
  Filled 2015-07-14 (×5): qty 15

## 2015-07-14 MED ORDER — SODIUM CHLORIDE 0.9 % IJ SOLN
3.0000 mL | Freq: Two times a day (BID) | INTRAMUSCULAR | Status: DC
  Administered 2015-07-14 – 2015-07-17 (×6): 3 mL via INTRAVENOUS

## 2015-07-14 MED ORDER — RIFAXIMIN 550 MG PO TABS
550.0000 mg | ORAL_TABLET | Freq: Two times a day (BID) | ORAL | Status: DC
  Administered 2015-07-14 – 2015-07-17 (×7): 550 mg via ORAL
  Filled 2015-07-14 (×7): qty 1

## 2015-07-14 MED ORDER — SODIUM POLYSTYRENE SULFONATE 15 GM/60ML PO SUSP: 30.0000 g | Freq: Once | ORAL | Status: AC

## 2015-07-14 MED ORDER — FOLIC ACID 1 MG PO TABS
1.0000 mg | ORAL_TABLET | Freq: Every day | ORAL | Status: DC
  Administered 2015-07-14 – 2015-07-17 (×4): 1 mg via ORAL
  Filled 2015-07-14 (×4): qty 1

## 2015-07-14 MED ORDER — TRAMADOL HCL 50 MG PO TABS: 50.0000 mg | ORAL_TABLET | Freq: Three times a day (TID) | ORAL | Status: DC | PRN

## 2015-07-14 MED ORDER — SODIUM POLYSTYRENE SULFONATE 15 GM/60ML PO SUSP
30.0000 g | Freq: Once | ORAL | Status: AC
  Administered 2015-07-14: 30 g via ORAL
  Filled 2015-07-14: qty 120

## 2015-07-14 MED ORDER — ENOXAPARIN SODIUM 30 MG/0.3ML ~~LOC~~ SOLN
30.0000 mg | SUBCUTANEOUS | Status: DC
  Administered 2015-07-14 – 2015-07-15 (×2): 30 mg via SUBCUTANEOUS
  Filled 2015-07-14 (×2): qty 0.3

## 2015-07-14 MED ORDER — ONDANSETRON HCL 4 MG PO TABS
4.0000 mg | ORAL_TABLET | Freq: Four times a day (QID) | ORAL | Status: DC | PRN
  Administered 2015-07-15: 4 mg via ORAL
  Filled 2015-07-14: qty 1

## 2015-07-14 MED ORDER — INSULIN ASPART 100 UNIT/ML ~~LOC~~ SOLN
0.0000 [IU] | Freq: Three times a day (TID) | SUBCUTANEOUS | Status: DC
  Administered 2015-07-14: 2 [IU] via SUBCUTANEOUS
  Administered 2015-07-15: 1 [IU] via SUBCUTANEOUS
  Administered 2015-07-15: 2 [IU] via SUBCUTANEOUS
  Administered 2015-07-16: 1 [IU] via SUBCUTANEOUS
  Administered 2015-07-16 – 2015-07-17 (×4): 2 [IU] via SUBCUTANEOUS

## 2015-07-14 MED ORDER — SODIUM CHLORIDE 0.9 % IV SOLN
1.0000 g | Freq: Once | INTRAVENOUS | Status: AC
  Administered 2015-07-14: 1 g via INTRAVENOUS
  Filled 2015-07-14: qty 10

## 2015-07-14 MED ORDER — ONDANSETRON HCL 4 MG/2ML IJ SOLN: 4.0000 mg | Freq: Four times a day (QID) | INTRAMUSCULAR | Status: DC | PRN

## 2015-07-14 NOTE — ED Notes (Signed)
Dr Juleen ChinaKohut performed abdominal paracentesis resulting in 5 large glass bottles of 1000 ML each. Specimens labeled and walked to lab by EMT Mellody DanceKeith.

## 2015-07-14 NOTE — ED Provider Notes (Signed)
Medical screening examination/treatment/procedure(s) were conducted as a shared visit with non-physician practitioner(s) and myself.  I personally evaluated the patient during the encounter.   EKG Interpretation None     59 year old male with abdominal pain and distention. He has a past history of cirrhosis/ascites requiring frequent therapeutic paracentesis. Initial plan today was to perform a therapeutic paracentesis. This was done without incident. 10.5 L was removed so albumin ordered and planned for discharge once infused assuming fluid cell counts ok.   Subsequent blood work significant for worsening renal failure and hyperkalemia. He was given calcium, bicarbonate Kayexalate for hyperkalemia. EKG without concerning changes. Cr 4.35 from 1.4 two weeks ago. He had acute kidney injury during hospitalization approximately 2 weeks ago. He was restarted on Lasix and Aldactone at discharge.   Paracentesis Date/Time: 07/14/2015 11:00 AM Performed by: Raeford RazorKOHUT, Lemoyne Scarpati Authorized by: Raeford RazorKOHUT, Tanesia Butner Consent: Verbal consent obtained. Risks and benefits: risks, benefits and alternatives were discussed Consent given by: patient Patient identity confirmed: verbally with patient and arm band Time out: Immediately prior to procedure a "time out" was called to verify the correct patient, procedure, equipment, support staff and site/side marked as required. Initial or subsequent exam: subsequent Procedure purpose: therapeutic Indications: abdominal discomfort secondary to ascites Anesthesia: local infiltration Local anesthetic: lidocaine 1% without epinephrine Anesthetic total: 5 ml Patient sedated: no Preparation: Patient was prepped and draped in the usual sterile fashion. Ultrasound guidance: yes. Large amount of fluid through out abdomen. Appeared to have bowel loop adherent to abdominal wall in one area of RLQ though.  Puncture site: left lower quadrant Fluid appearance: cloudy Patient  tolerance: Patient tolerated the procedure well with no immediate complications Comments: Only 5 vacuum containers available and 5L removed this way. Additional 5.5L removed manually via hand pumping for total of 10.5L removed.     Raeford RazorStephen Avaya Mcjunkins, MD 07/14/15 [phone]

## 2015-07-14 NOTE — ED Notes (Signed)
Received pt via EMS from home with c/o abdominal pain with N/V and distention onset 2 weeks ago.

## 2015-07-14 NOTE — ED Provider Notes (Signed)
CSN: [id number]     Arrival date & time 07/14/15  0807 History   First MD Initiated Contact with Patient 07/14/15 [phone]     Chief Complaint  Patient presents with  . Abdominal Pain  . Emesis     (Consider location/radiation/quality/duration/timing/severity/associated sxs/prior Treatment) HPI Patient presents to the emergency department with ascites.  The patient states that he is now having to have his ascites drained at least once every week or so.  He states that he does not have any other symptoms.  Patient denies nausea, vomiting, weakness, dizziness, headache, blurred vision, chest pain, back pain, fever, cough, runny nose, sore throat, dysuria, incontinence, place, or hematemesis, or syncope.  Patient states he does have some shortness of breath to the pressure from his abdomen Past Medical History  Diagnosis Date  . Hypertension   . 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 ETOH abuse   . Ascites 01/2013  . Hematoma 01/2013    posterior right flank from a fall  . Coagulopathy (HCC) 01/2013    secondary to liver disease.   . Anemia 02/2013  . Hyponatremia 01/2013  . Unspecified constipation 04/04/2013  . H pylori ulcer 03/05/2013  . Gastric ulcer with hemorrhage 03/03/2013  . Chronic wound of extremity-right great toe 03/03/2013  . Acute blood loss anemia 03/03/2013  . Spontaneous bacterial peritonitis (HCC) 09/20/2014  . Hyperlipidemia   . Pneumonia ? 09/2014; 02/2015  . Chronic bronchitis (HCC)   . GERD (gastroesophageal reflux disease)   . Daily headache   . Arthritis     "shoulders" (03/21/2015)  . Type II diabetes mellitus (HCC)     uncontrolled  . Hepatitis C    Past Surgical History  Procedure Laterality Date  . Tonsillectomy    . Esophagogastroduodenoscopy N/A 03/03/2013    Procedure: ESOPHAGOGASTRODUODENOSCOPY (EGD);  Surgeon: Beverley Fiedler, MD;  Location: [facility] ENDOSCOPY;  Service: Gastroenterology;  Laterality: N/A;  Bedside  . Inguinal hernia repair Right 1960's   . Paracentesis  09/2014; 02/2015; 03/21/2015   Family History  Problem Relation Age of Onset  . Dementia Mother   . Cancer - Other Father    Social History  Substance Use Topics  . Smoking status: Current Every Day Smoker -- 0.10 packs/day for 47 years    Types: Cigarettes  . Smokeless tobacco: Never Used  . Alcohol Use: Yes     Comment: 03/21/2015 "last drink was 02/15/2015)    Review of Systems  All other systems negative except as documented in the HPI. All pertinent positives and negatives as reviewed in the HPI.   Allergies  Penicillins  Home Medications   Prior to Admission medications   Medication Sig Start Date End Date Taking? Authorizing Provider  albuterol (PROVENTIL HFA;VENTOLIN HFA) 108 (90 BASE) MCG/ACT inhaler Inhale 2 puffs into the lungs every 2 (two) hours as needed for wheezing or shortness of breath (or coughing). 05/30/14   Dione Booze, MD  benzonatate (TESSALON) 200 MG capsule Take 1 capsule (200 mg total) by mouth 2 (two) times daily as needed for cough. Patient not taking: Reported on 05/21/2015 04/21/15   Nita Sells Mikhail, DO  diphenhydrAMINE (BENADRYL) 12.5 MG/5ML elixir Take 5 mLs (12.5 mg total) by mouth every 6 (six) hours as needed for itching. 06/26/15   Ripudeep Jenna Luo, MD  folic acid (FOLVITE) 1 MG tablet Take 1 tablet (1 mg total) by mouth daily. 09/21/14   Maryruth Bun Rama, MD  furosemide (LASIX) 40 MG  tablet Take 1 tablet (40 mg total) by mouth 2 (two) times daily. 06/27/15   Ripudeep Jenna Luo, MD  gabapentin (NEURONTIN) 300 MG capsule Take 600 mg by mouth 3 (three) times daily.    Historical Provider, MD  HYDROCODONE-ACETAMINOPHEN PO Take 1 tablet by mouth daily as needed (pain).    Historical Provider, MD  insulin aspart (NOVOLOG) 100 UNIT/ML injection Inject 0-9 Units into the skin 4 (four) times daily. 0-6 Units, Subcutaneous, 3 times daily with meals  CBG < 70: implement hypoglycemia protocol; CBG 70 - 120: 0 units; CBG 121 - 150: 1 unit; CBG 151 - 200:  2 units; CBG 201 - 250: 3 units; CBG 251 - 300: 5 units; CBG 301 - 350: 7 units; CBG 351 - 400: 9 units; CBG > 400: call MD 03/10/13   Maretta Bees, MD  insulin detemir (LEVEMIR) 100 UNIT/ML injection Inject 0.1 mLs (10 Units total) into the skin at bedtime. Patient taking differently: Inject 14 Units into the skin at bedtime.  04/29/15   Kathlen Mody, MD  lactulose (CHRONULAC) 10 GM/15ML solution Take 15 mLs (10 g total) by mouth 2 (two) times daily. 03/24/15   Maryann Mikhail, DO  Magnesium Oxide 420 MG TABS Take 1 tablet by mouth daily.    Historical Provider, MD  Multiple Vitamin (MULTIVITAMIN WITH MINERALS) TABS tablet Take 1 tablet by mouth daily. 09/21/14   Christina P Rama, MD  pantoprazole (PROTONIX) 40 MG tablet Take 40 mg by mouth 2 (two) times daily.     Historical Provider, MD  rifaximin (XIFAXAN) 550 MG TABS tablet Take 1 tablet (550 mg total) by mouth 2 (two) times daily. 03/24/15   Maryann Mikhail, DO  spironolactone (ALDACTONE) 25 MG tablet Take 1 tablet (25 mg total) by mouth daily. 06/27/15   Ripudeep Jenna Luo, MD  traMADol (ULTRAM) 50 MG tablet Take 1 tablet (50 mg total) by mouth every 8 (eight) hours as needed for severe pain. 06/26/15   Ripudeep Jenna Luo, MD   BP 113/77 mmHg  Pulse 114  Temp(Src) 97.7 F (36.5 C) (Oral)  Resp 16  Ht  (1.702 m)  Wt 90.719 kg  BMI 31.32 kg/m2  SpO2 96% Physical Exam  Constitutional: He is oriented to person, place, and time. He appears well-developed and well-nourished. No distress.  HENT:  Head: Normocephalic and atraumatic.  Mouth/Throat: Oropharynx is clear and moist.  Eyes: Pupils are equal, round, and reactive to light.  Neck: Normal range of motion. Neck supple.  Cardiovascular: Normal rate, regular rhythm and normal heart sounds.  Exam reveals no gallop and no friction rub.   No murmur heard. Pulmonary/Chest: Effort normal and breath sounds normal. No respiratory distress. He has no wheezes.  Abdominal: Bowel sounds are  normal. He exhibits distension and ascites. There is tenderness.  Neurological: He is alert and oriented to person, place, and time. He exhibits normal muscle tone. Coordination normal.  Skin: Skin is warm and dry. No rash noted. No erythema.  Psychiatric: He has a normal mood and affect. His behavior is normal.  Nursing note and vitals reviewed.   ED Course  Procedures (including critical care time) Labs Review Labs Reviewed  CBC - Abnormal; Notable for the following:    MCV 74.3 (*)    MCH 25.0 (*)    RDW 19.3 (*)    All other components within normal limits  BODY FLUID CELL COUNT WITH DIFFERENTIAL - Abnormal; Notable for the following:    Color, Fluid  STRAW (*)    Appearance, Fluid TURBID (*)    Monocyte-Macrophage-Serous Fluid 4 (*)    All other components within normal limits  COMPREHENSIVE METABOLIC PANEL - Abnormal; Notable for the following:    Potassium 5.9 (*)    Glucose, Bld 129 (*)    BUN 44 (*)    Creatinine, Ser 4.35 (*)    Calcium 8.8 (*)    Total Protein 6.4 (*)    Albumin 2.2 (*)    AST 70 (*)    GFR calc non Af Amer 13 (*)    GFR calc Af Amer 16 (*)    All other components within normal limits  LIPASE, BLOOD - Abnormal; Notable for the following:    Lipase 64 (*)    All other components within normal limits  BODY FLUID CULTURE  URINALYSIS, ROUTINE W REFLEX MICROSCOPIC (NOT AT [facility])   I have personally reviewed and evaluated these images and lab results as part of my medical decision-making.    Paracentesis was performed and 10-1/2 L of fluid was drained by Dr. Juleen ChinaKohut and myself.  Patient be admitted to the hospital due to his hyperkalemia and worsening renal function.  Patient feels better following the tap  Charlestine NightChristopher Shavonne Ambroise, PA-C 07/14/15 1413  Raeford RazorStephen Kohut, MD 07/20/15 1201

## 2015-07-14 NOTE — H&P (Signed)
[facility] History and Physical  Curtis CarbonWillie Estrada ZOX:[id number] DOB: [birth year] DOA: 07/14/2015  Referring physician: Emergency Department [facility]   CHIEF COMPLAINT:   Abdominal swelling, vomiting      HPI: Curtis CarbonWillie Addair is a 59 y.o. male with ETOH / HCV cirrhosis requiring frequent large volume paracentesis. No ETOH in months. for a few days and couldn't tolerate diuretics. Abdomen started swelling a week ago.  Doesn't use salt but eats canned foods and deli sandwhiches.   Stools are "little balls", taking Chronulac only once a day.  No abdominal pain. Hungry but hasn't been able to eat because of ascites. Feels much better now.     ED COURSE:           LVP  9-10 LITERS  Labs:   NA 137, k+ 5.9, bun 44, Cr 4.35 ( 2 weeks ago)  Medications  albumin human 25 % solution 50 g (not administered)  sodium bicarbonate injection 50 mEq (50 mEq Intravenous Given 07/14/15 1111)  calcium gluconate 1 g in sodium chloride 0.9 % 100 mL IVPB (1 g Intravenous New Bag/Given 07/14/15 1154)  sodium polystyrene (KAYEXALATE) 15 GM/60ML suspension 30 g (30 g Oral Given 07/14/15 1114)    Review of Systems  Constitutional: Negative.   HENT: Negative.   Eyes: Negative.   Respiratory: Negative.   Cardiovascular: Negative.   Gastrointestinal: Negative.   Genitourinary: Negative.   Musculoskeletal: Negative.   Skin: Negative.   Neurological: Negative.   Endo/Heme/Allergies: Negative.   Psychiatric/Behavioral: Negative.     Past Medical History  Diagnosis Date  . Hypertension   . 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 ETOH abuse   . Ascites 01/2013  . Hematoma 01/2013    posterior right flank from a fall  . Coagulopathy (HCC) 01/2013    secondary to liver disease.   . Anemia 02/2013  . Hyponatremia 01/2013  . Unspecified constipation 04/04/2013  . H pylori ulcer 03/05/2013  . Gastric ulcer with hemorrhage 03/03/2013  . Chronic wound of extremity-right great toe  03/03/2013  . Acute blood loss anemia 03/03/2013  . Spontaneous bacterial peritonitis (HCC) 09/20/2014  . Hyperlipidemia   . Pneumonia ? 09/2014; 02/2015  . Chronic bronchitis (HCC)   . GERD (gastroesophageal reflux disease)   . Daily headache   . Arthritis     "shoulders" (03/21/2015)  . Type II diabetes mellitus (HCC)     uncontrolled  . Hepatitis C    Past Surgical History  Procedure Laterality Date  . Tonsillectomy    . Esophagogastroduodenoscopy N/A 03/03/2013    Procedure: ESOPHAGOGASTRODUODENOSCOPY (EGD);  Surgeon: Beverley FiedlerJay M Pyrtle, MD;  [facility];  Service: Gastroenterology;  Laterality: N/A;  Bedside  . Inguinal hernia repair Right 1960's  . Paracentesis  09/2014; 02/2015; 03/21/2015    SOCIAL HISTORY:  reports that he has been smoking Cigarettes.  He has a 4.7 pack-year smoking history. He has never used smokeless tobacco. He reports that he drinks alcohol. He reports that he does not use illicit drugs. Lives: at home alone   Assistive devices:   cane needed for ambulation.   Allergies  Allergen Reactions  . Penicillins     Family History  Problem Relation Age of Onset  . Dementia Mother   . Cancer - Other Father     Prior to Admission medications   Medication Sig Start Date End Date Taking? Authorizing Provider  albuterol (PROVENTIL HFA;VENTOLIN HFA) 108 (90 BASE) MCG/ACT inhaler  Inhale 2 puffs into the lungs every 2 (two) hours as needed for wheezing or shortness of breath (or coughing). 05/30/14   Dione Booze, MD  benzonatate (TESSALON) 200 MG capsule Take 1 capsule (200 mg total) by mouth 2 (two) times daily as needed for cough. Patient not taking: Reported on 05/21/2015 04/21/15   Nita Sells Mikhail, DO  diphenhydrAMINE (BENADRYL) 12.5 MG/5ML elixir Take 5 mLs (12.5 mg total) by mouth every 6 (six) hours as needed for itching. 06/26/15   Ripudeep Jenna Luo, MD  folic acid (FOLVITE) 1 MG tablet Take 1 tablet (1 mg total) by mouth daily. 09/21/14   Maryruth Bun Rama, MD    furosemide (LASIX) 40 MG tablet Take 1 tablet (40 mg total) by mouth 2 (two) times daily. 06/27/15   Ripudeep Jenna Luo, MD  gabapentin (NEURONTIN) 300 MG capsule Take 600 mg by mouth 3 (three) times daily.    Historical Provider, MD  HYDROCODONE-ACETAMINOPHEN PO Take 1 tablet by mouth daily as needed (pain).    Historical Provider, MD  insulin aspart (NOVOLOG) 100 UNIT/ML injection Inject 0-9 Units into the skin 4 (four) times daily. 0-6 Units, Subcutaneous, 3 times daily with meals  CBG < 70: implement hypoglycemia protocol; CBG 70 - 120: 0 units; CBG 121 - 150: 1 unit; CBG 151 - 200: 2 units; CBG 201 - 250: 3 units; CBG 251 - 300: 5 units; CBG 301 - 350: 7 units; CBG 351 - 400: 9 units; CBG > 400: call MD 03/10/13   Maretta Bees, MD  insulin detemir (LEVEMIR) 100 UNIT/ML injection Inject 0.1 mLs (10 Units total) into the skin at bedtime. Patient taking differently: Inject 14 Units into the skin at bedtime.  04/29/15   Kathlen Mody, MD  lactulose (CHRONULAC) 10 GM/15ML solution Take 15 mLs (10 g total) by mouth 2 (two) times daily. 03/24/15   Maryann Mikhail, DO  Magnesium Oxide 420 MG TABS Take 1 tablet by mouth daily.    Historical Provider, MD  Multiple Vitamin (MULTIVITAMIN WITH MINERALS) TABS tablet Take 1 tablet by mouth daily. 09/21/14   Christina P Rama, MD  pantoprazole (PROTONIX) 40 MG tablet Take 40 mg by mouth 2 (two) times daily.     Historical Provider, MD  rifaximin (XIFAXAN) 550 MG TABS tablet Take 1 tablet (550 mg total) by mouth 2 (two) times daily. 03/24/15   Maryann Mikhail, DO  spironolactone (ALDACTONE) 25 MG tablet Take 1 tablet (25 mg total) by mouth daily. 06/27/15   Ripudeep Jenna Luo, MD  traMADol (ULTRAM) 50 MG tablet Take 1 tablet (50 mg total) by mouth every 8 (eight) hours as needed for severe pain. 06/26/15   Ripudeep Jenna Luo, MD   PHYSICAL EXAM: Filed Vitals:   07/14/15 1030 07/14/15 1100 07/14/15 1130 07/14/15 1145  BP: 108/82 100/76 108/81 110/80  Pulse:   111 111   Temp:      TempSrc:      Resp:   12 16  Height:      Weight:      SpO2:   100% 97%    Wt Readings from Last 3 Encounters:  07/14/15 90.719 kg (200 lb)  06/26/15 89.9 kg (198 lb 3.1 oz)  06/04/15 92.137 kg (203 lb 2 oz)    General:  Pleasant thin black male. Appears calm and comfortable Eyes: PER, normal lids, irises & conjunctiva ENT: grossly normal hearing, lips & tongue Neck: no LAD, no masses Cardiovascular: RRR, BLE pitting edema.  Respiratory: Respirations even and  unlabored. Diminished breath sounds at bilateral bases. No wheezes / rales .   Abdomen: soft, mildly distended, non-tender,active bowel sounds. No obvious masses.  Skin: no rash seen on limited exam. Chronic skin changes to BLE Musculoskeletal: grossly normal tone BUE/BLE Psychiatric: grossly normal mood and affect, speech fluent and appropriate Neurologic: grossly non-focal. .Mild asterixis but alert and oriented         LABS ON ADMISSION:    Basic Metabolic Panel:  Recent Labs Lab 07/14/15 0919  NA 137  K 5.9*  CL 102  CO2 24  GLUCOSE 129*  BUN 44*  CREATININE 4.35*  CALCIUM 8.8*   Liver Function Tests:  Recent Labs Lab 07/14/15 0919  AST 70*  ALT 24  ALKPHOS 115  BILITOT 1.2  PROT 6.4*  ALBUMIN 2.2*    Recent Labs Lab 07/14/15 0919  LIPASE 64*    CBC:  Recent Labs Lab 07/14/15 0839  WBC 10.5  HGB 14.2  HCT 42.3  MCV 74.3*  PLT 297    BNP (last 3 results)  Recent Labs  04/23/15 1943 06/22/15 1458 06/22/15 2100  BNP 25.7 23.0 25.2    CREATININE: 4.35 mg/dL ABNORMAL (16/10/96 0454) Estimated creatinine clearance - 19.1 mL/min   ASSESSMENT / PLAN   Decompensated cirrhosis with ascites. [facility] of his [facility] has [facility] at [facility]. Has been requiring frequent paracentesis. Hospitalized two weeks ago with AKI / ascites, s/p 2 large volume paracentesis. Now back with recurrent ascites, s/p removal of 8-9 liters in ED this am. MELD not calculated given that he has acute  on chronic renal failure which would make his number look  worse. Though previous MELD score not terrible patient requiring frequent paracentesis now and he has anasarca.   -Recommend Palliative Care evaluation, they saw him in October -agree with post-paracentesis albumin given renal failure. -Increase Lactulose to BID, Only taking once daily and describes constipation at home. Mild asterixis on      exam  -Dietary consult to help him better understand sodium content in food -I&O -Daily weights -check INR -resume diuretics when renal function allows.  -No evidence for SBP on fluid analysis today.   Acute on chronic renal failure. -admit for observation.  -Agree with IV albumin post paracentesis (50grams) -hold diuretics today, repeat BMET in am.   Hyperkalemia, K+ 5.9 in setting of acute on chronic renal failure.  -given 15gms of Kayexalate in ED -am BMET -monitor on telemetry -hold aldactone  Diabetes, type 2.  -CBGs -SSI  CONSULTANTS:   none  Code Status: full code DVT Prophylaxis:low dose lovenox Family Communication:  Patient alert, oriented and understands plan of care.  Disposition Plan: Discharge to home in 24-48 hours   Time spent: 60 minutes Willette Cluster  NP [facility] Pager [phone]  I have taken an interval history, reviewed the chart and examined the patient. I agree with the Advanced Practice Provider's note, impression and recommendations. I have made any necessary editorial changes. 59 year old male with end-stage liver disease, liver cirrhosis underwent paracentesis removing 9 L of fluid. Patient now being admitted for observation. Denies any pain, no confusion. Restart home medications. Likely discharge in a.m. if stable.

## 2015-07-15 DIAGNOSIS — E875 Hyperkalemia: Secondary | ICD-10-CM | POA: Diagnosis not present

## 2015-07-15 DIAGNOSIS — Z79899 Other long term (current) drug therapy: Secondary | ICD-10-CM | POA: Diagnosis not present

## 2015-07-15 DIAGNOSIS — N179 Acute kidney failure, unspecified: Secondary | ICD-10-CM | POA: Diagnosis not present

## 2015-07-15 DIAGNOSIS — Z794 Long term (current) use of insulin: Secondary | ICD-10-CM | POA: Diagnosis not present

## 2015-07-15 DIAGNOSIS — K219 Gastro-esophageal reflux disease without esophagitis: Secondary | ICD-10-CM | POA: Diagnosis not present

## 2015-07-15 DIAGNOSIS — I959 Hypotension, unspecified: Secondary | ICD-10-CM | POA: Diagnosis not present

## 2015-07-15 DIAGNOSIS — F17210 Nicotine dependence, cigarettes, uncomplicated: Secondary | ICD-10-CM | POA: Diagnosis not present

## 2015-07-15 DIAGNOSIS — E118 Type 2 diabetes mellitus with unspecified complications: Secondary | ICD-10-CM | POA: Diagnosis not present

## 2015-07-15 DIAGNOSIS — B1920 Unspecified viral hepatitis C without hepatic coma: Secondary | ICD-10-CM | POA: Diagnosis not present

## 2015-07-15 DIAGNOSIS — E1122 Type 2 diabetes mellitus with diabetic chronic kidney disease: Secondary | ICD-10-CM | POA: Diagnosis not present

## 2015-07-15 DIAGNOSIS — Z515 Encounter for palliative care: Secondary | ICD-10-CM | POA: Diagnosis not present

## 2015-07-15 DIAGNOSIS — F1010 Alcohol abuse, uncomplicated: Secondary | ICD-10-CM | POA: Diagnosis not present

## 2015-07-15 DIAGNOSIS — I129 Hypertensive chronic kidney disease with stage 1 through stage 4 chronic kidney disease, or unspecified chronic kidney disease: Secondary | ICD-10-CM | POA: Diagnosis not present

## 2015-07-15 DIAGNOSIS — E1165 Type 2 diabetes mellitus with hyperglycemia: Secondary | ICD-10-CM

## 2015-07-15 DIAGNOSIS — K7031 Alcoholic cirrhosis of liver with ascites: Secondary | ICD-10-CM | POA: Diagnosis not present

## 2015-07-15 DIAGNOSIS — R109 Unspecified abdominal pain: Secondary | ICD-10-CM | POA: Diagnosis not present

## 2015-07-15 DIAGNOSIS — N189 Chronic kidney disease, unspecified: Secondary | ICD-10-CM | POA: Diagnosis not present

## 2015-07-15 DIAGNOSIS — R188 Other ascites: Secondary | ICD-10-CM | POA: Diagnosis present

## 2015-07-15 DIAGNOSIS — E785 Hyperlipidemia, unspecified: Secondary | ICD-10-CM | POA: Diagnosis not present

## 2015-07-15 DIAGNOSIS — K7469 Other cirrhosis of liver: Secondary | ICD-10-CM | POA: Diagnosis not present

## 2015-07-15 DIAGNOSIS — I10 Essential (primary) hypertension: Secondary | ICD-10-CM | POA: Diagnosis not present

## 2015-07-15 LAB — URINALYSIS, ROUTINE W REFLEX MICROSCOPIC
Bilirubin Urine: NEGATIVE
Glucose, UA: NEGATIVE mg/dL
Hgb urine dipstick: NEGATIVE
Ketones, ur: NEGATIVE mg/dL
Leukocytes, UA: NEGATIVE
Nitrite: NEGATIVE
Protein, ur: NEGATIVE mg/dL
Specific Gravity, Urine: 1.017 (ref 1.005–1.030)
pH: 5.5 (ref 5.0–8.0)

## 2015-07-15 LAB — BASIC METABOLIC PANEL
Anion gap: 9 (ref 5–15)
BUN: 42 mg/dL — ABNORMAL HIGH (ref 6–20)
CHLORIDE: 102 mmol/L (ref 101–111)
CO2: 27 mmol/L (ref 22–32)
CREATININE: 3.55 mg/dL — AB (ref 0.61–1.24)
Calcium: 8.3 mg/dL — ABNORMAL LOW (ref 8.9–10.3)
GFR calc non Af Amer: 17 mL/min — ABNORMAL LOW (ref >60)
GFR, EST AFRICAN AMERICAN: 20 mL/min — AB (ref >60)
GLUCOSE: 163 mg/dL — AB (ref 65–99)
Potassium: 3.5 mmol/L (ref 3.5–5.1)
Sodium: 138 mmol/L (ref 135–145)

## 2015-07-15 LAB — GLUCOSE, CAPILLARY
GLUCOSE-CAPILLARY: 111 mg/dL — AB (ref 65–99)
Glucose-Capillary: 166 mg/dL — ABNORMAL HIGH (ref 65–99)
Glucose-Capillary: 133 mg/dL — ABNORMAL HIGH (ref 65–99)
Glucose-Capillary: 177 mg/dL — ABNORMAL HIGH (ref 65–99)

## 2015-07-15 LAB — CBC
HCT: 35 % — ABNORMAL LOW (ref 39.0–52.0)
HEMOGLOBIN: 11.8 g/dL — AB (ref 13.0–17.0)
MCH: 24.8 pg — AB (ref 26.0–34.0)
MCHC: 33.7 g/dL (ref 30.0–36.0)
MCV: 73.7 fL — AB (ref 78.0–100.0)
Platelets: 279 10*3/uL (ref 150–400)
RBC: 4.75 MIL/uL (ref 4.22–5.81)
RDW: 18.3 % — ABNORMAL HIGH (ref 11.5–15.5)
WBC: 8.1 10*3/uL (ref 4.0–10.5)

## 2015-07-15 NOTE — Progress Notes (Signed)
Utilization Review Completed.Curtis Estrada T1/07/2015  

## 2015-07-15 NOTE — Progress Notes (Signed)
[facility] PROGRESS NOTE  Curtis Estrada ZOX:[id number] DOB: Sep 05, 1955 DOA: 07/14/2015 PCP: [facility]  Assessment/Plan: 1. Decompensated liver cirrhosis with ascites- patient status post paracentesis, again complains of abdominal discomfort. Patient is requiring repeated paracentesis very frequently, will get palliative care consultation for goals of care. Continue lactulose 10 g twice a day. Continue rifaximin. 2. Diabetes mellitus- sliding-scale insulin 3. Acute on CKD- creatinine has improved. Follow BMP in a.m. continue to hold Lasix, Aldactone 4. Hyperkalemia- resolved today potassium is 3.5. 5. DVT prophylaxis-Lovenox  Code Status: Full code Family Communication: *No family present at bedside Disposition Plan: Pending improvement in abdominal pain   Consultants:  None  Procedures:  None  Antibiotics:  Rifaximin  HPI/Subjective: 60 y.o. male with ETOH / HCV cirrhosis requiring frequent large volume paracentesis. No ETOH in months. for a few days and couldn't tolerate diuretics. Abdomen started swelling a week ago. Doesn't use salt but eats canned foods and deli sandwhiches.  Patient underwent paracentesis yesterday, 9 liters fluid was removed. Patient again complains of abdominal pain.   Objective: Filed Vitals:   07/15/15 0544 07/15/15 1407  BP: 90/52 101/66  Pulse: 114 109  Temp: 98.7 F (37.1 C) 98.5 F (36.9 C)  Resp: 18 18   No intake or output data in the 24 hours ending 07/15/15 1559 Filed Weights   07/14/15 0814 07/14/15 1508 07/15/15 0544  Weight: 90.719 kg (200 lb) 84.006 kg (185 lb 3.2 oz) 85.1 kg (187 lb 9.8 oz)    Exam:   General:  *Appears in no acute distress  Cardiovascular: S1-S2 regular  Respiratory: Clear to auscultation bilaterally  Abdomen: Distended, mild generalized tenderness to palpation. No guarding or rigidity.  Musculoskeletal: Trace edema bilaterally in the lower extremities  Data Reviewed: Basic  Metabolic Panel:  Recent Labs Lab 07/14/15 0919 07/15/15 0305  NA 137 138  K 5.9* 3.5  CL 102 102  CO2 24 27  GLUCOSE 129* 163*  BUN 44* 42*  CREATININE 4.35* 3.55*  CALCIUM 8.8* 8.3*   Liver Function Tests:  Recent Labs Lab 07/14/15 0919  AST 70*  ALT 24  ALKPHOS 115  BILITOT 1.2  PROT 6.4*  ALBUMIN 2.2*    Recent Labs Lab 07/14/15 0919  LIPASE 64*   No results for input(s): AMMONIA in the last 168 hours. CBC:  Recent Labs Lab 07/14/15 0839 07/15/15 0305  WBC 10.5 8.1  HGB 14.2 11.8*  HCT 42.3 35.0*  MCV 74.3* 73.7*  PLT 297 279   Cardiac Enzymes: No results for input(s): CKTOTAL, CKMB, CKMBINDEX, TROPONINI in the last 168 hours. BNP (last 3 results)  Recent Labs  04/23/15 1943 06/22/15 1458 06/22/15 2100  BNP 25.7 23.0 25.2    ProBNP (last 3 results) No results for input(s): PROBNP in the last 8760 hours.  CBG:  Recent Labs Lab 07/14/15 1657 07/15/15 0806 07/15/15 1157  GLUCAP 159* 166* 133*    Recent Results (from the past 240 hour(s))  Body fluid culture     Status: None (Preliminary result)   Collection Time: 07/14/15 11:25 AM  Result Value Ref Range Status   Specimen Description PERITONEAL CAVITY  Final   Special Requests NONE  Final   Gram Stain   Final    MODERATE WBC PRESENT, PREDOMINANTLY MONONUCLEAR NO ORGANISMS SEEN    Culture NO GROWTH < 24 HOURS  Final   Report Status PENDING  Incomplete     Studies: No results found.  Scheduled Meds: . enoxaparin (LOVENOX) injection  30  mg Subcutaneous Q24H  . folic acid  1 mg Oral Daily  . gabapentin  600 mg Oral TID  . insulin aspart  0-9 Units Subcutaneous TID WC  . lactulose  10 g Oral BID  . rifaximin  550 mg Oral BID  . 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 If 7PM-7AM, please contact night-coverage at [email or web address], password [email or web address] 07/15/2015, 3:59 PM

## 2015-07-16 DIAGNOSIS — K7469 Other cirrhosis of liver: Secondary | ICD-10-CM | POA: Diagnosis not present

## 2015-07-16 DIAGNOSIS — R188 Other ascites: Secondary | ICD-10-CM

## 2015-07-16 DIAGNOSIS — B1920 Unspecified viral hepatitis C without hepatic coma: Secondary | ICD-10-CM | POA: Diagnosis not present

## 2015-07-16 DIAGNOSIS — N179 Acute kidney failure, unspecified: Secondary | ICD-10-CM

## 2015-07-16 DIAGNOSIS — Z515 Encounter for palliative care: Secondary | ICD-10-CM

## 2015-07-16 DIAGNOSIS — Z7189 Other specified counseling: Secondary | ICD-10-CM

## 2015-07-16 DIAGNOSIS — K7031 Alcoholic cirrhosis of liver with ascites: Secondary | ICD-10-CM | POA: Diagnosis not present

## 2015-07-16 LAB — CBC
HCT: 35.8 % — ABNORMAL LOW (ref 39.0–52.0)
Hemoglobin: 11.9 g/dL — ABNORMAL LOW (ref 13.0–17.0)
MCH: 24 pg — ABNORMAL LOW (ref 26.0–34.0)
MCHC: 33.2 g/dL (ref 30.0–36.0)
MCV: 72.3 fL — ABNORMAL LOW (ref 78.0–100.0)
PLATELETS: 283 10*3/uL (ref 150–400)
RBC: 4.95 MIL/uL (ref 4.22–5.81)
RDW: 17.7 % — AB (ref 11.5–15.5)
WBC: 8.4 10*3/uL (ref 4.0–10.5)

## 2015-07-16 LAB — COMPREHENSIVE METABOLIC PANEL
ALBUMIN: 1.9 g/dL — AB (ref 3.5–5.0)
ALT: 17 U/L (ref 17–63)
ANION GAP: 8 (ref 5–15)
AST: 46 U/L — AB (ref 15–41)
Alkaline Phosphatase: 78 U/L (ref 38–126)
BUN: 33 mg/dL — AB (ref 6–20)
CHLORIDE: 103 mmol/L (ref 101–111)
CO2: 25 mmol/L (ref 22–32)
Calcium: 7.9 mg/dL — ABNORMAL LOW (ref 8.9–10.3)
Creatinine, Ser: 2.02 mg/dL — ABNORMAL HIGH (ref 0.61–1.24)
GFR calc Af Amer: 39 mL/min — ABNORMAL LOW (ref >60)
GFR calc non Af Amer: 34 mL/min — ABNORMAL LOW (ref >60)
GLUCOSE: 141 mg/dL — AB (ref 65–99)
POTASSIUM: 3.1 mmol/L — AB (ref 3.5–5.1)
SODIUM: 136 mmol/L (ref 135–145)
Total Bilirubin: 1.1 mg/dL (ref 0.3–1.2)
Total Protein: 4.8 g/dL — ABNORMAL LOW (ref 6.5–8.1)

## 2015-07-16 LAB — BASIC METABOLIC PANEL
ANION GAP: 9 (ref 5–15)
BUN: 30 mg/dL — AB (ref 6–20)
CALCIUM: 8.3 mg/dL — AB (ref 8.9–10.3)
CO2: 27 mmol/L (ref 22–32)
CREATININE: 1.76 mg/dL — AB (ref 0.61–1.24)
Chloride: 101 mmol/L (ref 101–111)
GFR calc Af Amer: 46 mL/min — ABNORMAL LOW (ref >60)
GFR, EST NON AFRICAN AMERICAN: 40 mL/min — AB (ref >60)
GLUCOSE: 124 mg/dL — AB (ref 65–99)
Potassium: 3.3 mmol/L — ABNORMAL LOW (ref 3.5–5.1)
Sodium: 137 mmol/L (ref 135–145)

## 2015-07-16 LAB — GLUCOSE, CAPILLARY
GLUCOSE-CAPILLARY: 144 mg/dL — AB (ref 65–99)
Glucose-Capillary: 163 mg/dL — ABNORMAL HIGH (ref 65–99)
Glucose-Capillary: 145 mg/dL — ABNORMAL HIGH (ref 65–99)

## 2015-07-16 MED ORDER — POTASSIUM CHLORIDE 10 MEQ/100ML IV SOLN
10.0000 meq | INTRAVENOUS | Status: AC
  Administered 2015-07-16 (×2): 10 meq via INTRAVENOUS
  Filled 2015-07-16: qty 100

## 2015-07-16 MED ORDER — LACTULOSE 10 GM/15ML PO SOLN
10.0000 g | Freq: Three times a day (TID) | ORAL | Status: DC
  Administered 2015-07-16 – 2015-07-17 (×4): 10 g via ORAL
  Filled 2015-07-16 (×4): qty 15

## 2015-07-16 MED ORDER — ENOXAPARIN SODIUM 40 MG/0.4ML ~~LOC~~ SOLN
40.0000 mg | SUBCUTANEOUS | Status: DC
  Filled 2015-07-16: qty 0.4

## 2015-07-16 MED ORDER — POTASSIUM CHLORIDE CRYS ER 20 MEQ PO TBCR
40.0000 meq | EXTENDED_RELEASE_TABLET | Freq: Once | ORAL | Status: AC
  Administered 2015-07-16: 40 meq via ORAL
  Filled 2015-07-16: qty 2

## 2015-07-16 MED ORDER — SULFAMETHOXAZOLE-TRIMETHOPRIM 800-160 MG PO TABS
1.0000 | ORAL_TABLET | Freq: Every day | ORAL | Status: DC
  Administered 2015-07-16 – 2015-07-17 (×2): 1 via ORAL
  Filled 2015-07-16 (×2): qty 1

## 2015-07-16 MED ORDER — OXYCODONE HCL 5 MG PO TABS
5.0000 mg | ORAL_TABLET | ORAL | Status: DC | PRN
  Administered 2015-07-16 – 2015-07-17 (×3): 5 mg via ORAL
  Filled 2015-07-16 (×3): qty 1

## 2015-07-16 MED ORDER — HYDROXYZINE HCL 10 MG PO TABS
10.0000 mg | ORAL_TABLET | Freq: Three times a day (TID) | ORAL | Status: DC | PRN
  Filled 2015-07-16: qty 1

## 2015-07-16 MED ORDER — POTASSIUM CHLORIDE 20 MEQ PO PACK
40.0000 meq | PACK | Freq: Once | ORAL | Status: DC
  Filled 2015-07-16: qty 2

## 2015-07-16 NOTE — Progress Notes (Addendum)
[facility] PROGRESS NOTE  Curtis Estrada ZOX:[id number] DOB: July 19, 1955 DOA: 07/14/2015 PCP: [facility]  Assessment/Plan: 1. Decompensated liver cirrhosis with ascites- patient status post paracentesis, again complains of abdominal discomfort. Patient is requiring repeated paracentesis very frequently, palliative care consulted.  patient agrees to  have Aspira drain. Started Bactrim for SBP prophylaxis. Lactulose 10 g TID 2. Diabetes mellitus- sliding-scale insulin 3. Acute on CKD- creatinine has improved. Follow BMP in a.m. continue to hold Lasix, Aldactone 4. Hypokalemia- replace potassium and check BMP in a.m. 5. DVT prophylaxis-Lovenox  Code Status: Full code Family Communication: *No family present at bedside Disposition Plan: Patient to go home with home hospice.  Consultants:  None  Procedures:  None  Antibiotics:  Rifaximin  HPI/Subjective: 60 y.o. male with ETOH / HCV cirrhosis requiring frequent large volume paracentesis. No ETOH in months. for a few days and couldn't tolerate diuretics. Abdomen started swelling a week ago. Doesn't use salt but eats canned foods and deli sandwhiches.  Patient denies any complaints this morning.Palliative care to see the patient.   Objective: Filed Vitals:   07/16/15 0517 07/16/15 1437  BP: 97/61 116/45  Pulse: 112 119  Temp: 98.1 F (36.7 C) 98.7 F (37.1 C)  Resp: 18 19    Intake/Output Summary (Last 24 hours) at 07/16/15 1529 Last data filed at 07/16/15 1228  Gross per 24 hour  Intake    480 ml  Output      0 ml  Net    480 ml   Filed Weights   07/14/15 0814 07/14/15 1508 07/15/15 0544  Weight: 90.719 kg (200 lb) 84.006 kg (185 lb 3.2 oz) 85.1 kg (187 lb 9.8 oz)    Exam:   General:  *Appears in no acute distress  Cardiovascular: S1-S2 regular  Respiratory: Clear to auscultation bilaterally  Abdomen: Distended, mild generalized tenderness to palpation. No guarding or  rigidity.  Musculoskeletal: Trace edema bilaterally in the lower extremities  Data Reviewed: Basic Metabolic Panel:  Recent Labs Lab 07/14/15 0919 07/15/15 0305 07/16/15 0440  NA 137 138 136  K 5.9* 3.5 3.1*  CL 102 102 103  CO2 24 27 25   GLUCOSE 129* 163* 141*  BUN 44* 42* 33*  CREATININE 4.35* 3.55* 2.02*  CALCIUM 8.8* 8.3* 7.9*   Liver Function Tests:  Recent Labs Lab 07/14/15 0919 07/16/15 0440  AST 70* 46*  ALT 24 17  ALKPHOS 115 78  BILITOT 1.2 1.1  PROT 6.4* 4.8*  ALBUMIN 2.2* 1.9*    Recent Labs Lab 07/14/15 0919  LIPASE 64*   No results for input(s): AMMONIA in the last 168 hours. CBC:  Recent Labs Lab 07/14/15 0839 07/15/15 0305 07/16/15 0440  WBC 10.5 8.1 8.4  HGB 14.2 11.8* 11.9*  HCT 42.3 35.0* 35.8*  MCV 74.3* 73.7* 72.3*  PLT 297 279 283   Cardiac Enzymes: No results for input(s): CKTOTAL, CKMB, CKMBINDEX, TROPONINI in the last 168 hours. BNP (last 3 results)  Recent Labs  04/23/15 1943 06/22/15 1458 06/22/15 2100  BNP 25.7 23.0 25.2    ProBNP (last 3 results) No results for input(s): PROBNP in the last 8760 hours.  CBG:  Recent Labs Lab 07/15/15 1157 07/15/15 1729 07/15/15 2133 07/16/15 0801 07/16/15 1151  GLUCAP 133* 111* 177* 144* 163*    Recent Results (from the past 240 hour(s))  Body fluid culture     Status: None (Preliminary result)   Collection Time: 07/14/15 11:25 AM  Result Value Ref Range Status   Specimen  Description PERITONEAL CAVITY  Final   Special Requests NONE  Final   Gram Stain   Final    MODERATE WBC PRESENT, PREDOMINANTLY MONONUCLEAR NO ORGANISMS SEEN    Culture NO GROWTH 2 DAYS  Final   Report Status PENDING  Incomplete     Studies: No results found.  Scheduled Meds: . enoxaparin (LOVENOX) injection  40 mg Subcutaneous Q24H  . folic acid  1 mg Oral Daily  . gabapentin  600 mg Oral TID  . insulin aspart  0-9 Units Subcutaneous TID WC  . lactulose  10 g Oral BID  . rifaximin   550 mg Oral BID  . sodium chloride  3 mL Intravenous Q12H  . 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 If 7PM-7AM, please contact night-coverage at [email or web address], password [email or web address] 07/16/2015, 3:29 PM

## 2015-07-16 NOTE — Consult Note (Signed)
Consultation Note Date: 07/16/2015   Patient Name: Curtis Estrada  DOB: 08/18/1955  MRN: [id number]  Age / Sex: 60 y.o., male  PCP: [facility] Referring Physician: Meredeth IdeGagan S Lama, MD  Reason for Consultation: Disposition, Establishing goals of care, Hospice Evaluation and Non pain symptom management  Summary of Recommendations:  1. Aspira or peritoneal drain- per IR, options? Multiple admissions for paracentesis 2. SBP prophylaxis 3. Rifaxamin, HE prophylaxis 4. Regular bowel regimen, Lactulose TID 5. Nutrition counseling  6. Optimize diuretics 7. Oxycodone for Pain 8. Hospice at home setup for discharge 9. 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 He reports that 5 months ago he stopped drinking when he was told that his liver disease had gotten to the point where he was dying. He started treatment for his hepatitis C at the [facility], this was discontinued due to his intolerance of the IV infusions and lack of response to anti-virals. Since that time he has had minimal follow-up because [facility] He has 7 admissions in the last 6 months and 2 ED visits without admission. He only has health insurance through the [facility], he has no income, no other source of health coverage-he gets his medications at the [facility] for free, but has run out of most of them. He has 5 sisters who help him - one sister pays his rent and does his grocery shopping and helps with medications. He gets an aide for 2 hours a day through "[facility]  No skilled care at home and no comprehensive primary care oversight.   He reports during previous hospitalizations, "they talk about  putting in a drain" but it never happens- 9L was taken off 2 days ago and he still appears to be extremely distended with ascites. Nutrition is poor.Overall health literacy and self advocacy is low.    Contacts/Participants in Discussion:Patient only Primary Decision Maker: Self   Relationship to Patient self HCPOA: yes  Sister Alona BeneJoyce  SUMMARY OF RECOMMENDATIONS  Code Status/Advance Care Planning: DNR    Code Status Orders        Start     Ordered   07/14/15 1355  Full code   Continuous     07/14/15 1354      Other Directives:None  Symptom Management:   Main issue is his abdominal distention and pain from his poorly managed liver disease and lack of diuretic/medication/diet oversight and guidance.   We had a detailed discussion about the "terminal" nature of his liver disease and about QOL and how to help him live well for the time he has left. He feels that overall he is in the hospital more than he is at home and is growing increasingly tired.  I recommend IR evaluation for eitehr peritoneal Aspira drain or a [place] shut- his ascites is excessive. Optimize diuretics, place on SBP prophylaxis, Rifaxamin,etc.. He has pain but mostly from the distension of his abdomen. PRN oxycodone for now. Denies and agitation. He complains of a persistent cough-will start benzonatate.   Palliative Prophylaxis:   Bowel Regimen and Frequent Pain Assessment  Additional Recommendations (Limitations, Scope, Preferences):  Avoid Hospitalization  He is agreeable to Hospice Care at discharge - recommend home management of his ascites using a peritoneal drain . He also need  Social work assistance for disability/financial issues.  Psycho-social/Spiritual:  Support System: [employer]ire for further Chaplaincy support:yes Additional Recommendations: Education on Hospice  Prognosis: <6 months  Discharge Planning: Home with Hospice   Chief Complaint/ Primary Diagnoses: Present on Admission:  .  Hyperkalemia . Uncontrolled type 2 diabetes mellitus (HCC) . History of alcohol abuse . 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 The following aspects are pertinent.  Past Medical History  Diagnosis Date  . Hypertension   . 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 ETOH abuse   . Ascites 01/2013  . Hematoma 01/2013    posterior right flank from a fall  . Coagulopathy (HCC) 01/2013    secondary to liver disease.   . Anemia 02/2013  . Hyponatremia 01/2013  . Unspecified constipation 04/04/2013  . H pylori ulcer 03/05/2013  . Gastric ulcer with hemorrhage 03/03/2013  . Chronic wound of extremity-right great toe 03/03/2013  . Acute blood loss anemia 03/03/2013  . Spontaneous bacterial peritonitis (HCC) 09/20/2014  . Hyperlipidemia   . Pneumonia ? 09/2014; 02/2015  . Chronic bronchitis (HCC)   . GERD (gastroesophageal reflux disease)   . Daily headache   . Arthritis     "shoulders" (03/21/2015)  . Type II diabetes mellitus (HCC)     uncontrolled  . Hepatitis C    Social History   Social History  . Marital Status: Divorced    Spouse Name: N/A  . Number of Children: N/A  . Years of Education: N/A   Social History Main Topics  . Smoking status: Current Every Day Smoker -- 0.10 packs/day for 47 years    Types: Cigarettes  . Smokeless tobacco: Never Used  . Alcohol Use: Yes     Comment: 03/21/2015 "last drink was 02/15/2015)  . Drug Use: No  . Sexual Activity: Yes   Other Topics Concern  . None   Social History Narrative   Family History  Problem Relation Age of Onset  . Dementia Mother   . Cancer - Other Father    Scheduled Meds: . enoxaparin (LOVENOX) injection  40 mg Subcutaneous Q24H  . folic acid  1 mg Oral Daily  . gabapentin  600 mg Oral TID  . insulin aspart  0-9 Units Subcutaneous TID WC  . lactulose  10 g Oral BID  . rifaximin  550 mg Oral  BID  . sodium chloride  3 mL Intravenous Q12H   Continuous Infusions:  PRN Meds:.hydrOXYzine, ondansetron **OR** ondansetron (ZOFRAN) IV, traMADol Medications Prior to Admission:  Prior to Admission medications   Medication Sig Start Date End Date Taking? Authorizing Provider  albuterol (PROVENTIL HFA;VENTOLIN HFA) 108 (90 BASE) MCG/ACT inhaler Inhale 2 puffs into the lungs every 2 (two) hours as needed for wheezing or shortness of breath (or coughing). 05/30/14  Yes Dione Booze, MD  diphenhydrAMINE (BENADRYL) 12.5 MG/5ML elixir Take 5 mLs (12.5 mg total) by mouth every 6 (six) hours as needed for itching. 06/26/15  Yes Ripudeep Jenna Luo, MD  folic acid (FOLVITE) 1 MG tablet Take 1 tablet (1 mg total) by mouth daily. 09/21/14  Yes Maryruth Bun Rama, MD  furosemide (LASIX) 40 MG tablet Take 1 tablet (40 mg total) by mouth 2 (two) times daily. Patient taking differently: Take 80 mg by mouth 2 (two) times daily.  06/27/15  Yes Ripudeep Jenna Luo, MD  gabapentin (NEURONTIN) 300  MG capsule Take 600 mg by mouth 3 (three) times daily.   Yes Historical Provider, MD  insulin detemir (LEVEMIR) 100 UNIT/ML injection Inject 0.1 mLs (10 Units total) into the skin at bedtime. Patient taking differently: Inject 18 Units into the skin at bedtime.  04/29/15  Yes Kathlen Mody, MD  Magnesium Oxide 420 MG TABS Take 420 mg by mouth daily.    Yes Historical Provider, MD  Multiple Vitamin (MULTIVITAMIN WITH MINERALS) TABS tablet Take 1 tablet by mouth daily. 09/21/14  Yes Maryruth Bun Rama, MD  propranolol (INDERAL) 10 MG tablet Take 10 mg by mouth 2 (two) times daily.   Yes Historical Provider, MD  rifaximin (XIFAXAN) 550 MG TABS tablet Take 1 tablet (550 mg total) by mouth 2 (two) times daily. 03/24/15  Yes Maryann Mikhail, DO  spironolactone (ALDACTONE) 25 MG tablet Take 1 tablet (25 mg total) by mouth daily. Patient taking differently: Take 50 mg by mouth 2 (two) times daily.  06/27/15  Yes Ripudeep Jenna Luo, MD  traMADol  (ULTRAM) 50 MG tablet Take 1 tablet (50 mg total) by mouth every 8 (eight) hours as needed for severe pain. Patient taking differently: Take 25 mg by mouth 2 (two) times daily.  06/26/15  Yes Ripudeep Jenna Luo, MD  insulin aspart (NOVOLOG) 100 UNIT/ML injection Inject 0-9 Units into the skin 4 (four) times daily. 0-6 Units, Subcutaneous, 3 times daily with meals  CBG < 70: implement hypoglycemia protocol; CBG 70 - 120: 0 units; CBG 121 - 150: 1 unit; CBG 151 - 200: 2 units; CBG 201 - 250: 3 units; CBG 251 - 300: 5 units; CBG 301 - 350: 7 units; CBG 351 - 400: 9 units; CBG > 400: call MD 03/10/13   Maretta Bees, MD   Allergies  Allergen Reactions  . Other Itching    Ultrasound gel causes severe itching  . Penicillins Other (See Comments)    Childhood Has received Rocephin w/o reaction Has patient had a PCN reaction causing immediate rash, facial/tongue/throat swelling, SOB or lightheadedness with hypotension: Non/a Has patient had a PCN reaction causing severe rash involving mucus membranes or skin necrosis: Non/a Has patient had a PCN reaction that required hospitalization Nono Has patient had a PCN reaction occurring within the last 10 years: Nono If all of the above answe    Review of Systems  Constitutional: Positive for activity change, appetite change, fatigue and unexpected weight change.  Respiratory: Positive for cough.   Cardiovascular: Positive for leg swelling.  Gastrointestinal: Positive for abdominal pain, constipation and abdominal distention.  Neurological: Positive for weakness.  Psychiatric/Behavioral: Positive for sleep disturbance.    Physical Exam  Constitutional: He appears well-developed. No distress.  HENT:  Head: Normocephalic and atraumatic.  Mouth/Throat: No oropharyngeal exudate.  Eyes: Pupils are equal, round, and reactive to light.  Cardiovascular: Regular rhythm.   Murmur heard. Respiratory: Effort normal. No respiratory distress. He has wheezes.    GI: He exhibits distension and mass. There is no tenderness. There is no rebound and no guarding.  Massive ascites  Musculoskeletal: Normal range of motion.  Skin: He is not diaphoretic.  Psychiatric: He has a normal mood and affect. His behavior is normal. Judgment and thought content normal.    Vital Signs: BP 97/61 mmHg  Pulse 112  Temp(Src) 98.1 F (36.7 C) (Oral)  Resp 18  Ht 5\' 7"  (1.702 m)  Wt 85.1 kg (187 lb 9.8 oz)  BMI 29.38 kg/m2  SpO2 95%  SpO2: SpO2:  95 % O2 Device:SpO2: 95 % O2 Flow Rate: .   IO: Intake/output summary:  Intake/Output Summary (Last 24 hours) at 07/16/15 1435 Last data filed at 07/16/15 1000  Gross per 24 hour  Intake    360 ml  Output      8 ml  Net    352 ml    LBM: Last BM Date: 07/15/15 Baseline Weight: Weight: 90.719 kg (200 lb) Most recent weight: Weight: 85.1 kg (187 lb 9.8 oz)      Palliative Assessment/Data:  Flowsheet Rows        Most Recent Value   Intake Tab    Referral Department  Hospitalist   Unit at Time of Referral  Med/Surg Unit   Palliative Care Primary Diagnosis  Other (Comment) [ESLD]   Date Notified  07/16/15   Palliative Care Type  New Palliative care   Reason for referral  Advance Care Planning, Clarify Goals of Care, Counsel Regarding Hospice   Date of Admission  07/14/15   Date first seen by Palliative Care  07/16/15   # of days Palliative referral response time  0 Day(s)   # of days IP prior to Palliative referral  2   Clinical Assessment    Palliative Performance Scale Score  40%   Pain Max last 24 hours  2   Pain Min Last 24 hours  2   Dyspnea Max Last 24 Hours  2   Dyspnea Min Last 24 hours  2   Nausea Max Last 24 Hours  0   Nausea Min Last 24 Hours  0   Anxiety Max Last 24 Hours  0   Anxiety Min Last 24 Hours  0   Other Max Last 24 Hours  0   Psychosocial & Spiritual Assessment    Social Work Plan of Care  Referral to community resources, Education on Hospice   Palliative Care Outcomes     Patient/Family meeting held?  No   Palliative Care Outcomes  Counseled regarding hospice, Improved non-pain symptom therapy, Clarified goals of care, Changed CPR status   Palliative Care follow-up planned  Yes, Home   Palliative Care Follow-up Reason  Non-pain symptom      Additional Data Reviewed:  CBC:    Component Value Date/Time   WBC 8.4 07/16/2015 0440   HGB 11.9* 07/16/2015 0440   HCT 35.8* 07/16/2015 0440   PLT 283 07/16/2015 0440   MCV 72.3* 07/16/2015 0440   NEUTROABS 4.9 06/23/2015 0458   LYMPHSABS 1.7 06/23/2015 0458   MONOABS 1.9* 06/23/2015 0458   EOSABS 0.3 06/23/2015 0458   BASOSABS 0.1 06/23/2015 0458   Comprehensive Metabolic Panel:    Component Value Date/Time   NA 136 07/16/2015 0440   K 3.1* 07/16/2015 0440   CL 103 07/16/2015 0440   CO2 25 07/16/2015 0440   BUN 33* 07/16/2015 0440   CREATININE 2.02* 07/16/2015 0440   GLUCOSE 141* 07/16/2015 0440   CALCIUM 7.9* 07/16/2015 0440   AST 46* 07/16/2015 0440   ALT 17 07/16/2015 0440   ALKPHOS 78 07/16/2015 0440   BILITOT 1.1 07/16/2015 0440   PROT 4.8* 07/16/2015 0440   ALBUMIN 1.9* 07/16/2015 0440     Time In: 1:30PM Time Out: 2:40PM Time Total: 70 min Greater than 50%  of this time was spent counseling and coordinating care related to the above assessment and plan.  Signed by: Hilbert Odor, DO  07/16/2015, 2:35 PM  Please contact Palliative Medicine Team phone  at [phone] for questions and concerns.

## 2015-07-17 DIAGNOSIS — K7469 Other cirrhosis of liver: Secondary | ICD-10-CM | POA: Diagnosis not present

## 2015-07-17 DIAGNOSIS — E118 Type 2 diabetes mellitus with unspecified complications: Secondary | ICD-10-CM | POA: Diagnosis not present

## 2015-07-17 DIAGNOSIS — E1165 Type 2 diabetes mellitus with hyperglycemia: Secondary | ICD-10-CM | POA: Diagnosis not present

## 2015-07-17 DIAGNOSIS — K7031 Alcoholic cirrhosis of liver with ascites: Secondary | ICD-10-CM | POA: Diagnosis not present

## 2015-07-17 DIAGNOSIS — B1920 Unspecified viral hepatitis C without hepatic coma: Secondary | ICD-10-CM | POA: Diagnosis not present

## 2015-07-17 LAB — BASIC METABOLIC PANEL
Anion gap: 8 (ref 5–15)
BUN: 29 mg/dL — ABNORMAL HIGH (ref 6–20)
CALCIUM: 8 mg/dL — AB (ref 8.9–10.3)
CO2: 26 mmol/L (ref 22–32)
CREATININE: 1.66 mg/dL — AB (ref 0.61–1.24)
Chloride: 100 mmol/L — ABNORMAL LOW (ref 101–111)
GFR calc Af Amer: 50 mL/min — ABNORMAL LOW (ref >60)
GFR calc non Af Amer: 43 mL/min — ABNORMAL LOW (ref >60)
GLUCOSE: 199 mg/dL — AB (ref 65–99)
Potassium: 4 mmol/L (ref 3.5–5.1)
Sodium: 134 mmol/L — ABNORMAL LOW (ref 135–145)

## 2015-07-17 LAB — BODY FLUID CULTURE: Culture: NO GROWTH

## 2015-07-17 LAB — GLUCOSE, CAPILLARY
GLUCOSE-CAPILLARY: 154 mg/dL — AB (ref 65–99)
Glucose-Capillary: 148 mg/dL — ABNORMAL HIGH (ref 65–99)
Glucose-Capillary: 157 mg/dL — ABNORMAL HIGH (ref 65–99)
Glucose-Capillary: 117 mg/dL — ABNORMAL HIGH (ref 65–99)

## 2015-07-17 LAB — PATHOLOGIST SMEAR REVIEW

## 2015-07-17 MED ORDER — ZOLPIDEM TARTRATE 5 MG PO TABS
5.0000 mg | ORAL_TABLET | Freq: Once | ORAL | Status: AC
  Administered 2015-07-17: 5 mg via ORAL
  Filled 2015-07-17: qty 1

## 2015-07-17 MED ORDER — MIDODRINE HCL 2.5 MG PO TABS: 2.5000 mg | ORAL_TABLET | Freq: Three times a day (TID) | ORAL | Status: AC

## 2015-07-17 MED ORDER — FUROSEMIDE 40 MG PO TABS
40.0000 mg | ORAL_TABLET | Freq: Two times a day (BID) | ORAL | Status: DC
  Administered 2015-07-17: 40 mg via ORAL
  Filled 2015-07-17: qty 1

## 2015-07-17 MED ORDER — LACTULOSE 10 GM/15ML PO SOLN: 10.0000 g | Freq: Three times a day (TID) | ORAL | Status: AC

## 2015-07-17 MED ORDER — SPIRONOLACTONE 25 MG PO TABS
25.0000 mg | ORAL_TABLET | Freq: Every day | ORAL | Status: DC
  Administered 2015-07-17: 25 mg via ORAL
  Filled 2015-07-17: qty 1

## 2015-07-17 MED ORDER — OXYCODONE HCL 5 MG PO TABS: 5.0000 mg | ORAL_TABLET | ORAL | Status: AC | PRN

## 2015-07-17 MED ORDER — SPIRONOLACTONE 25 MG PO TABS: 50.0000 mg | ORAL_TABLET | Freq: Two times a day (BID) | ORAL | Status: AC

## 2015-07-17 MED ORDER — SULFAMETHOXAZOLE-TRIMETHOPRIM 800-160 MG PO TABS: 1.0000 | ORAL_TABLET | Freq: Every day | ORAL | Status: AC

## 2015-07-17 MED ORDER — ONDANSETRON HCL 4 MG PO TABS: 4.0000 mg | ORAL_TABLET | Freq: Four times a day (QID) | ORAL | Status: AC | PRN

## 2015-07-17 NOTE — Progress Notes (Signed)
Pt wanted to something to help him sleep and Ambien was ordered. He is an independent pt. He woke up a few hours later and looked sleepy. He said he wanted to use the restroom, I advised him it was not safe I would go with him to the rest room. He said he would go later by himself. I advised him that it was not safe and I attempted to turn on the bed alarm. He refused and asked to turn it off. I turned it off as he requested and also notified the charge nurse. Risks of falling was discussed and pt expresses understanding.

## 2015-07-17 NOTE — Consult Note (Signed)
Chief Complaint: Patient was seen in consultation today for peritoneal drain placement vs [place] shunt procedure Chief Complaint  Patient presents with  . Abdominal Pain  . Emesis   at the request of Dr Phillips Odor  Referring Physician(s): Dr Phillips Odor   History of Present Illness: Curtis Estrada is a 60 y.o. male   Pt with long hx alcoholic cirrhosis End stage liver disease And Hepatitis C Has undergone many paracentesis since as early as 02/2013 Has become more and more frequent Averaging 8 liter per para since 04/2015; weekly  Pt now with Hospice Care Request has been made for IR to consult with pt as to options for ascites management Meds include Lasix 40 mg BID; Aldactone 25 mg daily; Lactulose 10 gr TID  Dr Deanne Coffer has reviewed imaging and chart Has discussed with pt these options: 1. Pt can continue to get weekly or as needed OP paracentesis Although possibly inconvenient; can be done as OP for as long as needed 2. Peritoneal tunneled abdominal drain placement;  This option is more convenient for pt but does carry with it higher risk of infection and majority of pts do not survive as long with this choice secondary fluid imbalance from at home paracentesis several times a week. 3. Maximize medical management. Optimizing and aggressively treating his ascites with medication can lessen need for paracentesis if properly managed. Possible to even treat hypotension to allow maximum amount of fluid to be removed each visit.  [place] shunt procedure not best option at this point. Could discuss this at later date if MD/pt want to consider this option then   Past Medical History  Diagnosis Date  . Hypertension   . 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 ETOH abuse   . Ascites 01/2013  . Hematoma 01/2013    posterior right flank from a fall  . Coagulopathy (HCC) 01/2013    secondary to liver disease.   . Anemia 02/2013  . Hyponatremia 01/2013  . Unspecified  constipation 04/04/2013  . H pylori ulcer 03/05/2013  . Gastric ulcer with hemorrhage 03/03/2013  . Chronic wound of extremity-right great toe 03/03/2013  . Acute blood loss anemia 03/03/2013  . Spontaneous bacterial peritonitis (HCC) 09/20/2014  . Hyperlipidemia   . Pneumonia ? 09/2014; 02/2015  . Chronic bronchitis (HCC)   . GERD (gastroesophageal reflux disease)   . Daily headache   . Arthritis     "shoulders" (03/21/2015)  . Type II diabetes mellitus (HCC)     uncontrolled  . Hepatitis C     Past Surgical History  Procedure Laterality Date  . Tonsillectomy    . Esophagogastroduodenoscopy N/A 03/03/2013    Procedure: ESOPHAGOGASTRODUODENOSCOPY (EGD);  Surgeon: Beverley Fiedler, MD;  Location: [facility] ENDOSCOPY;  Service: Gastroenterology;  Laterality: N/A;  Bedside  . Inguinal hernia repair Right 1960's  . Paracentesis  09/2014; 02/2015; 03/21/2015    Allergies: Other and Penicillins  Medications: Prior to Admission medications   Medication Sig Start Date End Date Taking? Authorizing Provider  albuterol (PROVENTIL HFA;VENTOLIN HFA) 108 (90 BASE) MCG/ACT inhaler Inhale 2 puffs into the lungs every 2 (two) hours as needed for wheezing or shortness of breath (or coughing). 05/30/14  Yes Dione Booze, MD  diphenhydrAMINE (BENADRYL) 12.5 MG/5ML elixir Take 5 mLs (12.5 mg total) by mouth every 6 (six) hours as needed for itching. 06/26/15  Yes Ripudeep Jenna Luo, MD  folic acid (FOLVITE) 1 MG tablet Take 1 tablet (1 mg total) by  mouth daily. 09/21/14  Yes Maryruth Bunhristina P Rama, MD  furosemide (LASIX) 40 MG tablet Take 1 tablet (40 mg total) by mouth 2 (two) times daily. Patient taking differently: Take 80 mg by mouth 2 (two) times daily.  06/27/15  Yes Ripudeep Jenna LuoK Rai, MD  gabapentin (NEURONTIN) 300 MG capsule Take 600 mg by mouth 3 (three) times daily.   Yes Historical Provider, MD  insulin detemir (LEVEMIR) 100 UNIT/ML injection Inject 0.1 mLs (10 Units total) into the skin at bedtime. Patient taking differently:  Inject 18 Units into the skin at bedtime.  04/29/15  Yes Kathlen ModyVijaya Akula, MD  Magnesium Oxide 420 MG TABS Take 420 mg by mouth daily.    Yes Historical Provider, MD  Multiple Vitamin (MULTIVITAMIN WITH MINERALS) TABS tablet Take 1 tablet by mouth daily. 09/21/14  Yes Maryruth Bunhristina P Rama, MD  propranolol (INDERAL) 10 MG tablet Take 10 mg by mouth 2 (two) times daily.   Yes Historical Provider, MD  rifaximin (XIFAXAN) 550 MG TABS tablet Take 1 tablet (550 mg total) by mouth 2 (two) times daily. 03/24/15  Yes Maryann Mikhail, DO  spironolactone (ALDACTONE) 25 MG tablet Take 1 tablet (25 mg total) by mouth daily. Patient taking differently: Take 50 mg by mouth 2 (two) times daily.  06/27/15  Yes Ripudeep Jenna LuoK Rai, MD  traMADol (ULTRAM) 50 MG tablet Take 1 tablet (50 mg total) by mouth every 8 (eight) hours as needed for severe pain. Patient taking differently: Take 25 mg by mouth 2 (two) times daily.  06/26/15  Yes Ripudeep Jenna LuoK Rai, MD  insulin aspart (NOVOLOG) 100 UNIT/ML injection Inject 0-9 Units into the skin 4 (four) times daily. 0-6 Units, Subcutaneous, 3 times daily with meals  CBG < 70: implement hypoglycemia protocol; CBG 70 - 120: 0 units; CBG 121 - 150: 1 unit; CBG 151 - 200: 2 units; CBG 201 - 250: 3 units; CBG 251 - 300: 5 units; CBG 301 - 350: 7 units; CBG 351 - 400: 9 units; CBG > 400: call MD 03/10/13   Maretta BeesShanker M Ghimire, MD     Family History  Problem Relation Age of Onset  . Dementia Mother   . Cancer - Other Father     Social History   Social History  . Marital Status: Divorced    Spouse Name: N/A  . Number of Children: N/A  . Years of Education: N/A   Social History Main Topics  . Smoking status: Current Every Day Smoker -- 0.10 packs/day for 47 years    Types: Cigarettes  . Smokeless tobacco: Never Used  . Alcohol Use: Yes     Comment: 03/21/2015 "last drink was 02/15/2015)  . Drug Use: No  . Sexual Activity: Yes   Other Topics Concern  . None   Social History Narrative      Review of Systems: A 12 point ROS discussed and pertinent positives are indicated in the HPI above.  All other systems are negative.  Review of Systems  Constitutional: Positive for activity change and fatigue. Negative for fever and appetite change.  Respiratory: Positive for cough and shortness of breath.   Cardiovascular: Positive for leg swelling.  Gastrointestinal: Positive for nausea, abdominal pain and abdominal distention.  Musculoskeletal: Negative for gait problem.  Neurological: Positive for weakness.  Psychiatric/Behavioral: Negative for behavioral problems and confusion.    Vital Signs: BP 97/72 mmHg  Pulse 121  Temp(Src) 99.2 F (37.3 C) (Oral)  Resp 16  Ht 5\' 7"  (1.702 m)  Wt  190 lb 4.1 oz (86.3 kg)  BMI 29.79 kg/m2  SpO2 96%  Physical Exam  Constitutional: He is oriented to person, place, and time. He appears well-developed.  Eyes: EOM are normal.  Cardiovascular: Normal rate, regular rhythm and normal heart sounds.   Pulmonary/Chest: Effort normal and breath sounds normal.  Abdominal: Soft. Bowel sounds are normal. He exhibits distension. There is tenderness.  Musculoskeletal: Normal range of motion.  Neurological: He is alert and oriented to person, place, and time.  Skin: Skin is warm and dry.  Psychiatric: He has a normal mood and affect. His behavior is normal. Judgment and thought content normal.  Nursing note and vitals reviewed.   Mallampati Score:     Imaging: Dg Chest 2 View  06/22/2015  CLINICAL DATA:  Abdominal swelling, cirrhosis, multiple abdominal drains EXAM: CHEST  2 VIEW COMPARISON:  05/21/2015 FINDINGS: Study is limited by poor inspiration. Cardiomediastinal silhouette is stable. Mild left basilar atelectasis or scarring again noted. No infiltrate or pulmonary edema. IMPRESSION: Mild left basilar atelectasis or scarring again noted. No infiltrate or pulmonary edema. Electronically Signed   By: Natasha Mead M.D.   On: 06/22/2015 18:46    US Renal  06/22/2015  CLINICAL DATA:  Acute renal failure EXAM: RENAL / URINARY TRACT ULTRASOUND COMPLETE COMPARISON:  None. FINDINGS: Right Kidney: Length: 11.9 cm. Echogenicity within normal limits. No mass or hydronephrosis visualized. Left Kidney: Length: 10 cm. Echogenicity within normal limits. No mass or hydronephrosis visualized. Bladder: Appears normal for degree of bladder distention. Bilateral ureteral jets are visualized. Abundant abdominal ascites. IMPRESSION: 1. No hydronephrosis. No renal calculus. Abundant abdominal ascites. Bilateral ureteral jets are visualized within bladder. Electronically Signed   By: Natasha Mead M.D.   On: 06/22/2015 21:37   US Paracentesis  06/26/2015  INDICATION: Recurrent ascites EXAM: ULTRASOUND-GUIDED PARACENTESIS COMPARISON:  Previous para MEDICATIONS: 10 cc 1% lidocaine COMPLICATIONS: None immediate TECHNIQUE: Informed written consent was obtained from the patient after a discussion of the risks, benefits and alternatives to treatment. A timeout was performed prior to the initiation of the procedure. Initial ultrasound scanning demonstrates a large amount of ascites within the left lower abdominal quadrant. The left lower abdomen was prepped and draped in the usual sterile fashion. 1% lidocaine with epinephrine was used for local anesthesia. Under direct ultrasound guidance, a 19 gauge, 7-cm, Yueh catheter was introduced. An ultrasound image was saved for documentation purposed. The paracentesis was performed. The catheter was removed and a dressing was applied. The patient tolerated the procedure well without immediate post procedural complication. FINDINGS: A total of approximately 8.3 liters of yellow fluid was removed. IMPRESSION: Successful ultrasound-guided paracentesis yielding 8.3 liters of peritoneal fluid. Read by:  Robet Leu [facility] Electronically Signed   By: Gilmer Mor D.O.   On: 06/26/2015 13:36   US Paracentesis  06/23/2015  CLINICAL DATA:   Ascites EXAM: ULTRASOUND GUIDED PARACENTESIS COMPARISON:  06/22/2015 PROCEDURE: An ultrasound guided paracentesis was thoroughly discussed with the patient and questions answered. The benefits, risks, alternatives and complications were also discussed. The patient understands and wishes to proceed with the procedure. Written consent was obtained. Ultrasound was performed to localize and mark an adequate pocket of fluid in the right lower quadrant of the abdomen. The area was then prepped and draped in the normal sterile fashion. 1% Lidocaine was used for local anesthesia. Under ultrasound guidance a safety centesis needle catheter was introduced. Paracentesis was performed. The catheter was removed and a dressing applied. COMPLICATIONS: None immediate FINDINGS:  A total of approximately 8 L of cloudy exudative peritoneal fluid was removed. A fluid sample was sent for laboratory analysis. IMPRESSION: Successful ultrasound guided paracentesis yielding 8 L of ascites. Electronically Signed   By: Judie Petit.  Shick M.D.   On: 06/23/2015 10:41    Labs:  CBC:  Recent Labs  06/24/15 0420 07/14/15 0839 07/15/15 0305 07/16/15 0440  WBC 6.6 10.5 8.1 8.4  HGB 10.9* 14.2 11.8* 11.9*  HCT 32.6* 42.3 35.0* 35.8*  PLT 224 297 279 283    COAGS:  Recent Labs  03/22/15 0758  04/17/15 0616  06/22/15 1458 06/23/15 0458 06/24/15 0420 07/14/15 1536  INR 1.50*  < > 1.40  < > 1.43 1.60* 1.57* 1.43  APTT 42*  --  39*  --   --  44*  --  40*  < > = values in this interval not displayed.  BMP:  Recent Labs  07/15/15 0305 07/16/15 0440 07/16/15 1455 07/17/15 0716  NA 138 136 137 134*  K 3.5 3.1* 3.3* 4.0  CL 102 103 101 100*  CO2 27 25 27 26   GLUCOSE 163* 141* 124* 199*  BUN 42* 33* 30* 29*  CALCIUM 8.3* 7.9* 8.3* 8.0*  CREATININE 3.55* 2.02* 1.76* 1.66*  GFRNONAA 17* 34* 40* 43*  GFRAA 20* 39* 46* 50*    LIVER FUNCTION TESTS:  Recent Labs  06/23/15 0458 06/24/15 0420 07/14/15 0919  07/16/15 0440  BILITOT 1.1 1.1 1.2 1.1  AST 60* 53* 70* 46*  ALT 20 16* 24 17  ALKPHOS 117 82 115 78  PROT 5.3* 4.9* 6.4* 4.8*  ALBUMIN 1.7* 1.9* 2.2* 1.9*    TUMOR MARKERS: No results for input(s): AFPTM, CEA, CA199, CHROMGRNA in the last 8760 hours.  Assessment and Plan:  End stage liver disease Alcoholic cirrhosis Hep C Recurrent ascites- progressively more rapid accumulation Consult with Dr Deanne Coffer regarding options of ascites management Pt seeming to lean toward optimizing medical management at this point He has our office numbers if needed Call us if need further assistance  Thank you for this interesting consult.  I greatly enjoyed meeting Curtis Estrada and look forward to participating in their care.  A copy of this report was sent to the requesting provider on this date.  Signed: Arius Harnois A 07/17/2015, 3:23 PM   I spent a total of 40 Minutes    in face to face in clinical consultation, greater than 50% of which was counseling/coordinating care for consult for peritoneal abd drain

## 2015-07-17 NOTE — Progress Notes (Signed)
[facility] PROGRESS NOTE  [name] ZOX:[id number] DOB: 1956/04/17 DOA: 07/14/2015 [facility]  Assessment/Plan: 1. Decompensated liver cirrhosis with ascites- patient status post paracentesis, again complains of abdominal discomfort. Patient is requiring repeated paracentesis very frequently, palliative care consulted.  Patient agrees to  have Aspira drain. Started Bactrim for SBP prophylaxis. Lactulose 10 g TID 2. Diabetes mellitus- sliding-scale insulin 3. Acute on CKD- creatinine has improved, today the creatinine is 1.66. Follow BMP in a.m. Will restart lasix and aldactone. 4. Hypokalemia- replaced potassium. 5. DVT prophylaxis-Lovenox  Code Status: Full code Family Communication: No family present at bedside Disposition Plan: Patient to go home with home hospice.  Consultants:  None  Procedures:  None  Antibiotics:  Rifaximin  HPI/Subjective: 60 y.o. male with ETOH / HCV cirrhosis requiring frequent large volume paracentesis. No ETOH in months. for a few days and couldn't tolerate diuretics. Abdomen started swelling a week ago. Doesn't use salt but eats canned foods and deli sandwhiches.  Patient denies any complaints this morning. Plan for peritoneal catheter placement today.   Objective: Filed Vitals:   07/17/15 0528 07/17/15 0644  BP: 93/63 97/71  Pulse: 108 111  Temp:    Resp:      Intake/Output Summary (Last 24 hours) at 07/17/15 1306 Last data filed at 07/17/15 0945  Gross per 24 hour  Intake    240 ml  Output      0 ml  Net    240 ml   Filed Weights   07/14/15 1508 07/15/15 0544 07/17/15 0335  Weight: 84.006 kg (185 lb 3.2 oz) 85.1 kg (187 lb 9.8 oz) 86.3 kg (190 lb 4.1 oz)    Exam:   General:  *Appears in no acute distress  Cardiovascular: S1-S2 regular  Respiratory: Clear to auscultation bilaterally  Abdomen: Distended, mild generalized tenderness to palpation. No guarding or rigidity.  Musculoskeletal: Trace  edema bilaterally in the lower extremities  Data Reviewed: Basic Metabolic Panel:  Recent Labs Lab 07/14/15 0919 07/15/15 0305 07/16/15 0440 07/16/15 1455 07/17/15 0716  NA 137 138 136 137 134*  K 5.9* 3.5 3.1* 3.3* 4.0  CL 102 102 103 101 100*  CO2 24 27 25 27 26   GLUCOSE 129* 163* 141* 124* 199*  BUN 44* 42* 33* 30* 29*  CREATININE 4.35* 3.55* 2.02* 1.76* 1.66*  CALCIUM 8.8* 8.3* 7.9* 8.3* 8.0*   Liver Function Tests:  Recent Labs Lab 07/14/15 0919 07/16/15 0440  AST 70* 46*  ALT 24 17  ALKPHOS 115 78  BILITOT 1.2 1.1  PROT 6.4* 4.8*  ALBUMIN 2.2* 1.9*    Recent Labs Lab 07/14/15 0919  LIPASE 64*   No results for input(s): AMMONIA in the last 168 hours. CBC:  Recent Labs Lab 07/14/15 0839 07/15/15 0305 07/16/15 0440  WBC 10.5 8.1 8.4  HGB 14.2 11.8* 11.9*  HCT 42.3 35.0* 35.8*  MCV 74.3* 73.7* 72.3*  PLT 297 279 283   BNP (last 3 results)  Recent Labs  04/23/15 1943 06/22/15 1458 06/22/15 2100  BNP 25.7 23.0 25.2    ProBNP (last 3 results) No results for input(s): PROBNP in the last 8760 hours.  CBG:  Recent Labs Lab 07/16/15 1151 07/16/15 1701 07/17/15 0644 07/17/15 0820 07/17/15 1137  GLUCAP 163* 145* 148* 157* 154*    Recent Results (from the past 240 hour(s))  Body fluid culture     Status: None   Collection Time: 07/14/15 11:25 AM  Result Value Ref Range Status   Specimen  Description PERITONEAL CAVITY  Final   Special Requests NONE  Final   Gram Stain   Final    MODERATE WBC PRESENT, PREDOMINANTLY MONONUCLEAR NO ORGANISMS SEEN    Culture NO GROWTH 3 DAYS  Final   Report Status 07/17/2015 FINAL  Final     Studies: No results found.  Scheduled Meds: . enoxaparin (LOVENOX) injection  40 mg Subcutaneous Q24H  . folic acid  1 mg Oral Daily  . gabapentin  600 mg Oral TID  . insulin aspart  0-9 Units Subcutaneous TID WC  . lactulose  10 g Oral TID  . rifaximin  550 mg Oral BID  . sodium chloride  3 mL Intravenous  Q12H  . sulfamethoxazole-trimethoprim  1 tablet Oral Daily   Continuous Infusions:   Active Problems:   Uncontrolled type 2 diabetes mellitus (HCC)   History of alcohol abuse   Decompensated cirrhosis related to hepatitis C virus (HCV) (HCC)   Hyperkalemia   Goals of care, counseling/discussion    Time spent: 25 min    [facility]  [facility] Pager [phone]*. If 7PM-7AM, please contact night-coverage at [email or web address], password [facility] 07/17/2015, 1:06 PM

## 2015-07-17 NOTE — Discharge Summary (Signed)
Physician Discharge Summary  Bralyn Folkert ZOX:[id number] DOB: Sep 03, 1955 DOA: 07/14/2015  PCP: [facility]  Admit date: 07/14/2015 Discharge date: 07/17/2015  Time spent: 25* minutes  Recommendations for Outpatient Follow-up:  1. 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 Estrada with ETOH / HCV cirrhosis requiring frequent large volume paracentesis. No ETOH in months. for a few days and couldn't tolerate diuretics. Abdomen started swelling a week ago. Doesn't use salt but eats canned foods and deli sandwhiches.   Hospital Course:  1. Decompensated liver cirrhosis with ascites- patient status post paracentesis, again complains of abdominal discomfort. Patient is requiring repeated paracentesis very frequently, palliative care consulted.Initially  patient agreed to have Aspira drain, later after discussion with IR, he decided not to get peritoneal catheter. . Started Bactrim for SBP prophylaxis. Lactulose 10 g TID, Lasix 40 mg po bid, spironolactone 50 mg po bid, Rifaximin. Patient agreed for home hospice. Hospice RN saw the patient in the hospital; and told that they will follow at home in am. 2. Hypotension- To prevent hypotension, will start Midodrine 2.5 mg po TID 3. Diabetes mellitus- continue sliding scale insulin, Levemir. 4. Acute on CKD- creatinine has improved.  5. Hypokalemia- replaced potassium, today potassium is 4.0  Procedures:  None   Consults- Palliative care Discharge Exam: Filed  Vitals:   07/17/15 1330 07/17/15 1804  BP: 97/72 95/68  Pulse: 121 109  Temp: 99.2 F (37.3 C)   Resp: 16       Discharge Instructions   Discharge Instructions    Diet - low sodium heart healthy    Complete by:  As directed      Increase activity slowly    Complete by:  As directed           Current Discharge Medication List    START taking these medications   Details  midodrine (PROAMATINE) 2.5 MG tablet Take 1 tablet (2.5 mg total) by mouth 3 (three) times daily with meals. Qty: 90 tablet, Refills: 3    ondansetron (ZOFRAN) 4 MG tablet Take 1 tablet (4 mg total) by mouth every 6 (six) hours as needed for nausea. Qty: 20 tablet, Refills: 0    oxyCODONE (OXY IR/ROXICODONE) 5 MG immediate release tablet Take 1 tablet (5 mg total) by mouth every 4 (four) hours as needed for moderate pain. Qty: 30 tablet, Refills: 0    sulfamethoxazole-trimethoprim (BACTRIM DS,SEPTRA DS) 800-160 MG tablet Take 1 tablet by mouth daily. Qty: 30 tablet, Refills: 3      CONTINUE these medications which have CHANGED   Details  lactulose (CHRONULAC) 10 GM/15ML solution Take 15 mLs (10 g total) by mouth 3 (three) times daily. Qty: 240 mL, Refills: 0    spironolactone (ALDACTONE) 25 MG tablet Take 2 tablets (50 mg total) by mouth 2 (two) times daily.      CONTINUE these medications which have NOT CHANGED   Details  albuterol (PROVENTIL HFA;VENTOLIN HFA) 108 (90 BASE) MCG/ACT inhaler Inhale  2 puffs into the lungs every 2 (two) hours as needed for wheezing or shortness of breath (or coughing). Qty: 1 Inhaler, Refills: 0    diphenhydrAMINE (BENADRYL) 12.5 MG/5ML elixir Take 5 mLs (12.5 mg total) by mouth every 6 (six) hours as needed for itching. Qty: 120 mL, Refills: 0    folic acid (FOLVITE) 1 MG tablet Take 1 tablet (1 mg total) by mouth daily. Qty: 30 tablet, Refills: 2    furosemide (LASIX) 40 MG tablet Take 1 tablet (40 mg total) by mouth 2 (two) times daily. Qty: 30 tablet,  Refills: 1    gabapentin (NEURONTIN) 300 MG capsule Take 600 mg by mouth 3 (three) times daily.    insulin detemir (LEVEMIR) 100 UNIT/ML injection Inject 0.1 mLs (10 Units total) into the skin at bedtime. Qty: 10 mL, Refills: 0    Magnesium Oxide 420 MG TABS Take 420 mg by mouth daily.     Multiple Vitamin (MULTIVITAMIN WITH MINERALS) TABS tablet Take 1 tablet by mouth daily. Qty: 30 tablet, Refills: 2    propranolol (INDERAL) 10 MG tablet Take 10 mg by mouth 2 (two) times daily.    rifaximin (XIFAXAN) 550 MG TABS tablet Take 1 tablet (550 mg total) by mouth 2 (two) times daily. Qty: 60 tablet, Refills: 0    insulin aspart (NOVOLOG) 100 UNIT/ML injection Inject 0-9 Units into the skin 4 (four) times daily. 0-6 Units, Subcutaneous, 3 times daily with meals  CBG < 70: implement hypoglycemia protocol; CBG 70 - 120: 0 units; CBG 121 - 150: 1 unit; CBG 151 - 200: 2 units; CBG 201 - 250: 3 units; CBG 251 - 300: 5 units; CBG 301 - 350: 7 units; CBG 351 - 400: 9 units; CBG > 400: call MD      STOP taking these medications     traMADol (ULTRAM) 50 MG tablet         The results of significant diagnostics from this hospitalization (including imaging, microbiology, ancillary and laboratory) are listed below for reference.    Significant Diagnostic Studies: Dg Chest 2 View  06/22/2015  CLINICAL DATA:  Abdominal swelling, cirrhosis, multiple abdominal drains EXAM: CHEST  2 VIEW COMPARISON:  05/21/2015 FINDINGS: Study is limited by poor inspiration. Cardiomediastinal silhouette is stable. Mild left basilar atelectasis or scarring again noted. No infiltrate or pulmonary edema. IMPRESSION: Mild left basilar atelectasis or scarring again noted. No infiltrate or pulmonary edema. Electronically Signed   By: Natasha Mead M.D.   On: 06/22/2015 18:46   US Renal  06/22/2015  CLINICAL DATA:  Acute renal failure EXAM: RENAL / URINARY TRACT ULTRASOUND COMPLETE COMPARISON:  None. FINDINGS: Right Kidney: Length:  11.9 cm. Echogenicity within normal limits. No mass or hydronephrosis visualized. Left Kidney: Length: 10 cm. Echogenicity within normal limits. No mass or hydronephrosis visualized. Bladder: Appears normal for degree of bladder distention. Bilateral ureteral jets are visualized. Abundant abdominal ascites. IMPRESSION: 1. No hydronephrosis. No renal calculus. Abundant abdominal ascites. Bilateral ureteral jets are visualized within bladder. Electronically Signed   By: Natasha Mead M.D.   On: 06/22/2015 21:37   US Paracentesis  06/26/2015  INDICATION: Recurrent ascites EXAM: ULTRASOUND-GUIDED PARACENTESIS COMPARISON:  Previous para MEDICATIONS: 10 cc 1% lidocaine COMPLICATIONS: None immediate TECHNIQUE: Informed written consent was obtained from the patient after a discussion of the risks, benefits and alternatives to treatment. A timeout was performed prior to the initiation of the procedure. Initial ultrasound scanning demonstrates a large amount of ascites within the left lower  abdominal quadrant. The left lower abdomen was prepped and draped in the usual sterile fashion. 1% lidocaine with epinephrine was used for local anesthesia. Under direct ultrasound guidance, a 19 gauge, 7-cm, Yueh catheter was introduced. An ultrasound image was saved for documentation purposed. The paracentesis was performed. The catheter was removed and a dressing was applied. The patient tolerated the procedure well without immediate post procedural complication. FINDINGS: A total of approximately 8.3 liters of yellow fluid was removed. IMPRESSION: Successful ultrasound-guided paracentesis yielding 8.3 liters of peritoneal fluid. Read by:  Robet Leu [facility] Electronically Signed   By: Gilmer Mor D.O.   On: 06/26/2015 13:36   US Paracentesis  06/23/2015  CLINICAL DATA:  Ascites EXAM: ULTRASOUND GUIDED PARACENTESIS COMPARISON:  06/22/2015 PROCEDURE: An ultrasound guided paracentesis was thoroughly discussed with the patient and  questions answered. The benefits, risks, alternatives and complications were also discussed. The patient understands and wishes to proceed with the procedure. Written consent was obtained. Ultrasound was performed to localize and mark an adequate pocket of fluid in the right lower quadrant of the abdomen. The area was then prepped and draped in the normal sterile fashion. 1% Lidocaine was used for local anesthesia. Under ultrasound guidance a safety centesis needle catheter was introduced. Paracentesis was performed. The catheter was removed and a dressing applied. COMPLICATIONS: None immediate FINDINGS: A total of approximately 8 L of cloudy exudative peritoneal fluid was removed. A fluid sample was sent for laboratory analysis. IMPRESSION: Successful ultrasound guided paracentesis yielding 8 L of ascites. Electronically Signed   By: Judie Petit.  Shick M.D.   On: 06/23/2015 10:41    Microbiology: Recent Results (from the past 240 hour(s))  Body fluid culture     Status: None   Collection Time: 07/14/15 11:25 AM  Result Value Ref Range Status   Specimen Description PERITONEAL CAVITY  Final   Special Requests NONE  Final   Gram Stain   Final    MODERATE WBC PRESENT, PREDOMINANTLY MONONUCLEAR NO ORGANISMS SEEN    Culture NO GROWTH 3 DAYS  Final   Report Status 07/17/2015 FINAL  Final     Labs: Basic Metabolic Panel:  Recent Labs Lab 07/14/15 0919 07/15/15 0305 07/16/15 0440 07/16/15 1455 07/17/15 0716  NA 137 138 136 137 134*  K 5.9* 3.5 3.1* 3.3* 4.0  CL 102 102 103 101 100*  CO2 24 27 25 27 26   GLUCOSE 129* 163* 141* 124* 199*  BUN 44* 42* 33* 30* 29*  CREATININE 4.35* 3.55* 2.02* 1.76* 1.66*  CALCIUM 8.8* 8.3* 7.9* 8.3* 8.0*   Liver Function Tests:  Recent Labs Lab 07/14/15 0919 07/16/15 0440  AST 70* 46*  ALT 24 17  ALKPHOS 115 78  BILITOT 1.2 1.1  PROT 6.4* 4.8*  ALBUMIN 2.2* 1.9*    Recent Labs Lab 07/14/15 0919  LIPASE 64*   No results for input(s): AMMONIA in the  last 168 hours. CBC:  Recent Labs Lab 07/14/15 0839 07/15/15 0305 07/16/15 0440  WBC 10.5 8.1 8.4  HGB 14.2 11.8* 11.9*  HCT 42.3 35.0* 35.8*  MCV 74.3* 73.7* 72.3*  PLT 297 279 283   Cardiac Enzymes: No results for input(s): CKTOTAL, CKMB, CKMBINDEX, TROPONINI in the last 168 hours. BNP: BNP (last 3 results)  Recent Labs  04/23/15 1943 06/22/15 1458 06/22/15 2100  BNP 25.7 23.0 25.2    ProBNP (last 3 results) No results for input(s): PROBNP in the last 8760 hours.  CBG:  Recent Labs Lab 07/16/15 1701 07/17/15 6045  07/17/15 0820 07/17/15 1137 07/17/15 1705  GLUCAP 145* 148* 157* 154* 117*       Signed:  Meredeth IdeLAMA,Evania Lyne S MD   [facility] 07/17/2015, 6:30 PM

## 2015-07-17 NOTE — Care Management Note (Addendum)
Case Management Note  Patient Details  Name: Curtis Estrada MRN: [id number] Date of Birth: 07-10-1956  Subjective/Objective:                  Familiar patient, frequent admits for ascites, admitted from home, [address], has H[place] through [employer]. [place] VA SW is [employer] Mischler pager [phone]. Candise BowensJen notified that patient will be receiving a drain for his ascites (today per MD note) and home hospice was recommended. Patient was seen by palliative this admission. Patient's sister was also at the bedside today, initial encounter for CM. J Mischler stated that sister called her earlier to update her that hospice services were recommended. CM faxed requested info to J Mischler to initiate home hospice. [facility] and he has rolling walker at home.   Action/Plan:  Will continue to arrange home hospice through assistance with VA.  [facility], [facility].  Contact numbers for patient are: Patient [phone] Curtis JackSister Mamey [phone] Paul DykesSister Madeline [phone] Vicenta AlySister Joyce [phone]   Expected Discharge Date:  07/16/15               Expected Discharge Plan:  Home/Self Care  In-House Referral:     Discharge planning Services  CM Consult  Post Acute Care Choice:    Choice offered to:     DME Arranged:    DME Agency:     HH Arranged:    HH Agency:     Status of Service:  In process, will continue to follow  Medicare Important Message Given:    Date Medicare IM Given:    Medicare IM give by:    Date Additional Medicare IM Given:    Additional Medicare Important Message give by:     If discussed at Long Length of Stay Meetings, dates discussed:    Additional Comments:  Lawerance SabalDebbie Shanan Mcmiller, RN 07/17/2015, 2:32 PM

## 2015-07-17 NOTE — Progress Notes (Signed)
Nsg Discharge Note  Admit Date:  07/14/2015 Discharge date: 07/17/2015   Curtis Estrada to be D/C'd home with hospice per MD order.  AVS completed.  Copy for chart, and copy for patient signed, and dated. Patient able to verbalize understanding.  Discharge Medication:   Medication List    STOP taking these medications        traMADol 50 MG tablet  Commonly known as:  ULTRAM      TAKE these medications        albuterol 108 (90 Base) MCG/ACT inhaler  Commonly known as:  PROVENTIL HFA;VENTOLIN HFA  Inhale 2 puffs into the lungs every 2 (two) hours as needed for wheezing or shortness of breath (or coughing).     diphenhydrAMINE 12.5 MG/5ML elixir  Commonly known as:  BENADRYL  Take 5 mLs (12.5 mg total) by mouth every 6 (six) hours as needed for itching.     folic acid 1 MG tablet  Commonly known as:  FOLVITE  Take 1 tablet (1 mg total) by mouth daily.     furosemide 40 MG tablet  Commonly known as:  LASIX  Take 1 tablet (40 mg total) by mouth 2 (two) times daily.     gabapentin 300 MG capsule  Commonly known as:  NEURONTIN  Take 600 mg by mouth 3 (three) times daily.     insulin aspart 100 UNIT/ML injection  Commonly known as:  novoLOG  Inject 0-9 Units into the skin 4 (four) times daily. 0-6 Units, Subcutaneous, 3 times daily with meals  CBG < 70: implement hypoglycemia protocol; CBG 70 - 120: 0 units; CBG 121 - 150: 1 unit; CBG 151 - 200: 2 units; CBG 201 - 250: 3 units; CBG 251 - 300: 5 units; CBG 301 - 350: 7 units; CBG 351 - 400: 9 units; CBG > 400: call MD     insulin detemir 100 UNIT/ML injection  Commonly known as:  LEVEMIR  Inject 0.1 mLs (10 Units total) into the skin at bedtime.     lactulose 10 GM/15ML solution  Commonly known as:  CHRONULAC  Take 15 mLs (10 g total) by mouth 3 (three) times daily.     Magnesium Oxide 420 MG Tabs  Take 420 mg by mouth daily.     midodrine 2.5 MG tablet  Commonly known as:  PROAMATINE  Take 1 tablet (2.5 mg total) by  mouth 3 (three) times daily with meals.     multivitamin with minerals Tabs tablet  Take 1 tablet by mouth daily.     ondansetron 4 MG tablet  Commonly known as:  ZOFRAN  Take 1 tablet (4 mg total) by mouth every 6 (six) hours as needed for nausea.     oxyCODONE 5 MG immediate release tablet  Commonly known as:  Oxy IR/ROXICODONE  Take 1 tablet (5 mg total) by mouth every 4 (four) hours as needed for moderate pain.     propranolol 10 MG tablet  Commonly known as:  INDERAL  Take 10 mg by mouth 2 (two) times daily.     rifaximin 550 MG Tabs tablet  Commonly known as:  XIFAXAN  Take 1 tablet (550 mg total) by mouth 2 (two) times daily.     spironolactone 25 MG tablet  Commonly known as:  ALDACTONE  Take 2 tablets (50 mg total) by mouth 2 (two) times daily.     sulfamethoxazole-trimethoprim 800-160 MG tablet  Commonly known as:  BACTRIM DS,SEPTRA DS  Take 1 tablet  by mouth daily.        Discharge Assessment: Filed Vitals:   07/17/15 1330 07/17/15 1804  BP: 97/72 95/68  Pulse: 121 109  Temp: 99.2 F (37.3 C)   Resp: 16    Skin clean, dry and intact without evidence of skin break down, no evidence of skin tears noted. IV catheter discontinued with catheter tip intact. Site without signs and symptoms of complications - no redness or edema noted at insertion site, patient denies c/o pain - only slight tenderness at site.  Dressing with slight pressure applied.  D/c Instructions-Education: Discharge instructions given to patient with verbalized understanding. D/c education completed with patient including follow up instructions, medication list, d/c activities limitations if indicated, with other d/c instructions as indicated by MD - patient able to verbalize understanding, all questions fully answered. Patient instructed to return to ED, call 911, or call MD for any changes in condition.  RN reiterated the importance of getting his medications tonight per [facility] RN. Pt verablized understanding and informed RN that he would have his sister take him to the pharmacy before heading home.  Patient medications that were locked in pharmacy, charge RN has went down there to get it and return to patient. Pt sitting on side of bed eating dinner at this time and is waiting for his sister to arrive to pick him up. Bed is low and locked. Call bell within reach. Will monitor pt until night shift RN arrives.   Tobin Chad, RN 07/17/2015 7:14 PM

## 2015-07-17 NOTE — Progress Notes (Signed)
[place] [facility] [place]. She was notified of plan for DC with Home Hospice and that patient was having drain for ascites placed today.

## 2015-07-17 NOTE — Progress Notes (Signed)
Hospice coordinator Misty Stanley(Lisa) called RN to inform RN that they will be going to his house tomorrow to see him. Also that when MD is to discharge pt, MD is to have pain and nausea medication prescriptions written for pt. RN paged MD and waiting for MD to return call. Tobin Chadracy Arley Salamone 07/17/2015 1655.

## 2015-07-23 ENCOUNTER — Other Ambulatory Visit (HOSPITAL_COMMUNITY): Payer: Self-pay | Admitting: Internal Medicine

## 2015-07-23 DIAGNOSIS — K7031 Alcoholic cirrhosis of liver with ascites: Secondary | ICD-10-CM

## 2015-07-27 ENCOUNTER — Other Ambulatory Visit: Payer: Self-pay | Admitting: Internal Medicine

## 2015-07-27 DIAGNOSIS — K7031 Alcoholic cirrhosis of liver with ascites: Secondary | ICD-10-CM

## 2015-07-31 ENCOUNTER — Other Ambulatory Visit: Payer: Self-pay | Admitting: Radiology

## 2015-07-31 ENCOUNTER — Ambulatory Visit
Admission: RE | Admit: 2015-07-31 | Discharge: 2015-07-31 | Disposition: A | Discharge status: Home / Self Care | Payer: Non-veteran care | Source: Ambulatory Visit | Attending: Internal Medicine | Admitting: Internal Medicine

## 2015-07-31 DIAGNOSIS — K7031 Alcoholic cirrhosis of liver with ascites: Secondary | ICD-10-CM

## 2015-07-31 DIAGNOSIS — B182 Chronic viral hepatitis C: Secondary | ICD-10-CM

## 2015-07-31 NOTE — Consult Note (Signed)
Chief Complaint: Recurrent Ascites  Referring Physician(s): Hertweck,Donald  History of Present Illness: Curtis Estrada is a 60 y.o. male presenting today as a scheduled appointment, kindly referred by Dr. Barbee Shropshire, for evaluation for recurrent ascites secondary to his known liver disease.     Our first imaging record of a paracentesis is 03/06/2013. During the course of 2016 the patient had multiple paracentesis performed. Most recently in October November and December, he was requiring large volume paracentesis every week or 2. His last performed 06/26/2015 yielded 8.3 L.  He has been getting majority of his health care at a [facility] in [place], and we do not have access to his records.  He has had a recent admission in December 2016 to [facility] for his abdominal swelling and vomiting. Paracentesis was performed in the emergency department with at least 9 L removed. Admission diagnoses included his D compensated cirrhosis and ascites, acute on chronic renal failure, hyperkalemia, and diabetes.  The source of his liver disease is alcoholic cirrhosis complicated by chronic hepatitis C infection.  He received a consult during this admission from palliative care and interventional radiology.  A tunneled catheter was deferred at the time of this admission.  I had a long discussion with him regarding the source of his ascites including his liver disease, the relevant anatomy with portal hypertension, pathology/pathophysiology, and potential therapies. The therapies that I mention include liver transplant, TIPS placement, large volume paracentesis, Denver shunt, and as a last option a tunneled external drain such as a Pleurx.  He tells me he has not yet had a conversation regarding a potential liver transplant.  He has a history in his past of spontaneous bacterial peritonitis, although we do not have a positive culture from the most recent paracentesis. There are some additional labs  that I would like to acquire from his next paracentesis.  I did discuss with him the idea of an externalized tunneled peritoneal catheter such as a Pleurx catheter. I would like to reserve this as a last resort for comfort measures only, as shunting his ascitic fluid externally would likely lead to accelerated dehydration and could shorten his life expectancy. I did share this thought process with him.  Current MELD calculated at 18.  Tbili is 1.1 Current Child-Pugh calculated at numeric 10, Category C.    Past Medical History  Diagnosis Date  . Hypertension   . 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 ETOH abuse   . Ascites 01/2013  . Hematoma 01/2013    posterior right flank from a fall  . Coagulopathy (HCC) 01/2013    secondary to liver disease.   . Anemia 02/2013  . Hyponatremia 01/2013  . Unspecified constipation 04/04/2013  . H pylori ulcer 03/05/2013  . Gastric ulcer with hemorrhage 03/03/2013  . Chronic wound of extremity-right great toe 03/03/2013  . Acute blood loss anemia 03/03/2013  . Spontaneous bacterial peritonitis (HCC) 09/20/2014  . Hyperlipidemia   . Pneumonia ? 09/2014; 02/2015  . Chronic bronchitis (HCC)   . GERD (gastroesophageal reflux disease)   . Daily headache   . Arthritis     "shoulders" (03/21/2015)  . Type II diabetes mellitus (HCC)     uncontrolled  . Hepatitis C     Past Surgical History  Procedure Laterality Date  . Tonsillectomy    . Esophagogastroduodenoscopy N/A 03/03/2013    Procedure: ESOPHAGOGASTRODUODENOSCOPY (EGD);  Surgeon: Beverley Fiedler, MD;  Location: [facility] ENDOSCOPY;  Service: Gastroenterology;  Laterality: N/A;  Bedside  . Inguinal hernia repair Right 1960's  . Paracentesis  09/2014; 02/2015; 03/21/2015    Allergies: Other and Penicillins  Medications: Prior to Admission medications   Medication Sig Start Date End Date Taking? Authorizing Provider  albuterol (PROVENTIL HFA;VENTOLIN HFA) 108 (90 BASE) MCG/ACT inhaler Inhale 2 puffs into  the lungs every 2 (two) hours as needed for wheezing or shortness of breath (or coughing). 05/30/14  Yes Dione Booze, MD  diphenhydrAMINE (BENADRYL) 12.5 MG/5ML elixir Take 5 mLs (12.5 mg total) by mouth every 6 (six) hours as needed for itching. 06/26/15  Yes Ripudeep Jenna Luo, MD  folic acid (FOLVITE) 1 MG tablet Take 1 tablet (1 mg total) by mouth daily. 09/21/14  Yes Maryruth Bun Rama, MD  furosemide (LASIX) 40 MG tablet Take 1 tablet (40 mg total) by mouth 2 (two) times daily. Patient taking differently: Take 80 mg by mouth 2 (two) times daily.  06/27/15  Yes Ripudeep Jenna Luo, MD  gabapentin (NEURONTIN) 300 MG capsule Take 600 mg by mouth 3 (three) times daily.   Yes Historical Provider, MD  insulin aspart (NOVOLOG) 100 UNIT/ML injection Inject 0-9 Units into the skin 4 (four) times daily. 0-6 Units, Subcutaneous, 3 times daily with meals  CBG < 70: implement hypoglycemia protocol; CBG 70 - 120: 0 units; CBG 121 - 150: 1 unit; CBG 151 - 200: 2 units; CBG 201 - 250: 3 units; CBG 251 - 300: 5 units; CBG 301 - 350: 7 units; CBG 351 - 400: 9 units; CBG > 400: call MD 03/10/13  Yes Shanker Levora Dredge, MD  insulin detemir (LEVEMIR) 100 UNIT/ML injection Inject 0.1 mLs (10 Units total) into the skin at bedtime. Patient taking differently: Inject 18 Units into the skin at bedtime.  04/29/15  Yes Kathlen Mody, MD  lactulose (CHRONULAC) 10 GM/15ML solution Take 15 mLs (10 g total) by mouth 3 (three) times daily. 07/17/15  Yes Meredeth Ide, MD  Magnesium Oxide 420 MG TABS Take 420 mg by mouth daily.    Yes Historical Provider, MD  midodrine (PROAMATINE) 2.5 MG tablet Take 1 tablet (2.5 mg total) by mouth 3 (three) times daily with meals. 07/17/15  Yes Meredeth Ide, MD  Multiple Vitamin (MULTIVITAMIN WITH MINERALS) TABS tablet Take 1 tablet by mouth daily. 09/21/14  Yes Christina P Rama, MD  ondansetron (ZOFRAN) 4 MG tablet Take 1 tablet (4 mg total) by mouth every 6 (six) hours as needed for nausea. 07/17/15  Yes Meredeth Ide, MD  oxyCODONE (OXY IR/ROXICODONE) 5 MG immediate release tablet Take 1 tablet (5 mg total) by mouth every 4 (four) hours as needed for moderate pain. 07/17/15  Yes Meredeth Ide, MD  propranolol (INDERAL) 10 MG tablet Take 10 mg by mouth 2 (two) times daily.   Yes Historical Provider, MD  spironolactone (ALDACTONE) 25 MG tablet Take 2 tablets (50 mg total) by mouth 2 (two) times daily. 07/17/15  Yes Meredeth Ide, MD  traMADol (ULTRAM) 50 MG tablet Take by mouth every 6 (six) hours as needed.   Yes Historical Provider, MD  rifaximin (XIFAXAN) 550 MG TABS tablet Take 1 tablet (550 mg total) by mouth 2 (two) times daily. 03/24/15   Maryann Mikhail, DO  sulfamethoxazole-trimethoprim (BACTRIM DS,SEPTRA DS) 800-160 MG tablet Take 1 tablet by mouth daily. 07/17/15   Meredeth Ide, MD     Family History  Problem Relation Age of Onset  . Dementia Mother   . Cancer -  Other Father     Social History   Social History  . Marital Status: Divorced    Spouse Name: N/A  . Number of Children: N/A  . Years of Education: N/A   Social History Main Topics  . Smoking status: Current Every Day Smoker -- 0.10 packs/day for 47 years    Types: Cigarettes  . Smokeless tobacco: Never Used  . Alcohol Use: Yes     Comment: 03/21/2015 "last drink was 02/15/2015)  . Drug Use: No  . Sexual Activity: Yes   Other Topics Concern  . Not on file   Social History Narrative       Review of Systems: A 12 point ROS discussed and pertinent positives are indicated in the HPI above.  All other systems are negative.  Review of Systems  Vital Signs: BP 81/56 mmHg  Pulse 87  Temp(Src) 98.4 F (36.9 C) (Oral)  Resp 12  Ht  (1.702 m)  Wt 190 lb (86.183 kg)  BMI 29.75 kg/m2  SpO2 95%  Physical Exam  Atraumatic, normocephalic. Mucous membranes moist pink. No scleral icterus or injection. Alert and oriented to person place and time. Conjugate gaze. No glasses. Using supplemental oxygen. No labored  breathing. Distended abdomen. Genitourinary deferred. No swelling of the lower extremity.  Mallampati Score:  2  Imaging: No results found.  Labs:  CBC:  Recent Labs  06/24/15 0420 07/14/15 0839 07/15/15 0305 07/16/15 0440  WBC 6.6 10.5 8.1 8.4  HGB 10.9* 14.2 11.8* 11.9*  HCT 32.6* 42.3 35.0* 35.8*  PLT 224 297 279 283    COAGS:  Recent Labs  03/22/15 0758  04/17/15 0616  06/22/15 1458 06/23/15 0458 06/24/15 0420 07/14/15 1536  INR 1.50*  < > 1.40  < > 1.43 1.60* 1.57* 1.43  APTT 42*  --  39*  --   --  44*  --  40*  < > = values in this interval not displayed.  BMP:  Recent Labs  07/15/15 0305 07/16/15 0440 07/16/15 1455 07/17/15 0716  NA 138 136 137 134*  K 3.5 3.1* 3.3* 4.0  CL 102 103 101 100*  CO2 GLUCOSE 163* 141* 124* 199*  BUN 42* 33* 30* 29*  CALCIUM 8.3* 7.9* 8.3* 8.0*  CREATININE 3.55* 2.02* 1.76* 1.66*  GFRNONAA 17* 34* 40* 43*  GFRAA 20* 39* 46* 50*    LIVER FUNCTION TESTS:  Recent Labs  06/23/15 0458 06/24/15 0420 07/14/15 0919 07/16/15 0440  BILITOT 1.1 1.1 1.2 1.1  AST 60* 53* 70* 46*  ALT 20 16* 24 17  ALKPHOS 117 82 115 78  PROT 5.3* 4.9* 6.4* 4.8*  ALBUMIN 1.7* 1.9* 2.2* 1.9*    TUMOR MARKERS: No results for input(s): AFPTM, CEA, CA199, CHROMGRNA in the last 8760 hours.  Assessment and Plan:  Mr. Canepa is a 60 year old gentleman with recurrent ascites secondary to liver disease (alcohol-related and hepatitis C), with multiple large volume paracentesis. His last was in the emergency department 06/26/2015 yielding 9 L.  He has been referred by palliative care for potentially a tunneled external catheter, a solution which I would prefer to employ as an absolute last option given that it would likely dehydrate him very quickly. Potential treatment options for him would be liver transplant, TIPS, Denver shunt, or continued large volume paracentesis.  Because he has not yet had a conversation regarding  potential liver transplant candidacy I would like for him to see Annamarie Major of [facility] Hepatology service.  I will also establish an appointment for his next paracentesis, hopefully tomorrow at [facility]. We will order a lab panel for his ascitic fluid. We can also order an updated lab panel to evaluate his most current meld score.  Once he has had his appointment with Ms. Bradley Ferris and we have the results of his lab studies, we will contact him regarding our recommendations for further therapy. Until this point he can continue large volume paracentesis.  He understands and agrees with our plan of care.  Thank you for this interesting consult.  I greatly enjoyed meeting Curtis Estrada and look forward to participating in their care.  A copy of this report was sent to the requesting provider on this date.  Electronically Signed: Gilmer Mor 07/31/2015, 2:15 PM   I spent a total of  40 Minutes   in face to face in clinical consultation, greater than 50% of which was counseling/coordinating care for recurrent ascites, liver disease, possible TIPS, possible Denver shunt, possible tunneled catheter.

## 2015-08-03 ENCOUNTER — Ambulatory Visit (HOSPITAL_COMMUNITY)
Admission: RE | Admit: 2015-08-03 | Discharge: 2015-08-03 | Disposition: A | Discharge status: Home / Self Care | Source: Ambulatory Visit | Attending: Internal Medicine | Admitting: Internal Medicine

## 2015-08-03 ENCOUNTER — Encounter (HOSPITAL_COMMUNITY): Payer: Self-pay

## 2015-08-03 ENCOUNTER — Encounter (HOSPITAL_COMMUNITY)
Admission: RE | Admit: 2015-08-03 | Discharge: 2015-08-03 | Disposition: A | Discharge status: Home / Self Care | Payer: Non-veteran care | Source: Ambulatory Visit | Attending: Internal Medicine | Admitting: Internal Medicine

## 2015-08-03 DIAGNOSIS — Z029 Encounter for administrative examinations, unspecified: Secondary | ICD-10-CM | POA: Diagnosis present

## 2015-08-03 DIAGNOSIS — K7031 Alcoholic cirrhosis of liver with ascites: Secondary | ICD-10-CM | POA: Diagnosis present

## 2015-08-03 LAB — BODY FLUID CELL COUNT WITH DIFFERENTIAL
Lymphs, Fluid: 45 %
Monocyte-Macrophage-Serous Fluid: 54 % (ref 50–90)
NEUTROPHIL FLUID: 1 % (ref 0–25)
WBC FLUID: 289 uL (ref 0–1000)

## 2015-08-03 LAB — PATHOLOGIST SMEAR REVIEW

## 2015-08-03 LAB — GRAM STAIN

## 2015-08-03 MED ORDER — SODIUM CHLORIDE 0.9 % IV SOLN
Freq: Once | INTRAVENOUS | Status: AC
  Administered 2015-08-03: 12:00:00 via INTRAVENOUS

## 2015-08-03 MED ORDER — ALBUMIN HUMAN 25 % IV SOLN
50.0000 g | Freq: Once | INTRAVENOUS | Status: AC
Start: 2015-08-03 — End: 2015-08-03
  Administered 2015-08-03: 50 g via INTRAVENOUS
  Filled 2015-08-03: qty 200

## 2015-08-03 NOTE — Procedures (Signed)
Successful US guided paracentesis from [facility].  Yielded 9.7L of cloudy yellow fluid.  No immediate complications.  Pt tolerated well.   Specimen was sent for labs.  Brayton El PA-C 08/03/2015 11:41 AM

## 2015-08-03 NOTE — Discharge Instructions (Signed)
Albumin injection °What is this medicine? °ALBUMIN (al BYOO min) is used to treat or prevent shock following serious injury, bleeding, surgery, or burns by increasing the volume of blood plasma. This medicine can also replace low blood protein. °This medicine may be used for other purposes; ask your health care provider or pharmacist if you have questions. °What should I tell my health care provider before I take this medicine? °They need to know if you have any of the following conditions: °-anemia °-heart disease °-kidney disease °-an unusual or allergic reaction to albumin, other medicines, foods, dyes, or preservatives °-pregnant or trying to get pregnant °-breast-feeding °How should I use this medicine? °This medicine is for infusion into a vein. It is given by a health-care professional in a hospital or clinic. °Talk to your pediatrician regarding the use of this medicine in children. While this drug may be prescribed for selected conditions, precautions do apply. °Overdosage: If you think you have taken too much of this medicine contact a poison control center or emergency room at once. °NOTE: This medicine is only for you. Do not share this medicine with others. °What if I miss a dose? °This does not apply. °What may interact with this medicine? °Interactions are not expected. °This list may not describe all possible interactions. Give your health care provider a list of all the medicines, herbs, non-prescription drugs, or dietary supplements you use. Also tell them if you smoke, drink alcohol, or use illegal drugs. Some items may interact with your medicine. °What should I watch for while using this medicine? °Your condition will be closely monitored while you receive this medicine. °Some products are derived from human plasma, and there is a small risk that these products may contain certain types of virus or bacteria. All products are processed to kill most viruses and bacteria. If you have questions  concerning the risk of infections, discuss them with your doctor or health care professional. °What side effects may I notice from receiving this medicine? °Side effects that you should report to your doctor or health care professional as soon as possible: °-allergic reactions like skin rash, itching or hives, swelling of the face, lips, or tongue °-breathing problems °-changes in heartbeat °-fever, chills °-pain, redness or swelling at the injection site °-signs of viral infection including fever, drowsiness, chills, runny nose followed in about 2 weeks by a rash and joint pain °-tightness in the chest °Side effects that usually do not require medical attention (report to your doctor or health care professional if they continue or are bothersome): °-increased salivation °-nausea, vomiting °This list may not describe all possible side effects. Call your doctor for medical advice about side effects. You may report side effects to FDA at 1-800-FDA-1088. °Where should I keep my medicine? °This does not apply. You will not be given this medicine to store at home. °NOTE: This sheet is a summary. It may not cover all possible information. If you have questions about this medicine, talk to your doctor, pharmacist, or health care provider. °  °© 2016, [employer]. (2007-09-23 10:18:55) ° °

## 2015-08-07 ENCOUNTER — Other Ambulatory Visit (HOSPITAL_COMMUNITY): Payer: Self-pay | Admitting: Internal Medicine

## 2015-08-07 DIAGNOSIS — R188 Other ascites: Secondary | ICD-10-CM

## 2015-08-08 LAB — CULTURE, BODY FLUID-BOTTLE: CULTURE: NO GROWTH

## 2015-08-08 LAB — CULTURE, BODY FLUID W GRAM STAIN -BOTTLE

## 2015-08-10 ENCOUNTER — Ambulatory Visit (HOSPITAL_COMMUNITY)
Admission: RE | Admit: 2015-08-10 | Discharge: 2015-08-10 | Disposition: A | Discharge status: Home / Self Care | Source: Ambulatory Visit | Attending: Internal Medicine | Admitting: Internal Medicine

## 2015-08-10 DIAGNOSIS — B1920 Unspecified viral hepatitis C without hepatic coma: Secondary | ICD-10-CM | POA: Diagnosis not present

## 2015-08-10 DIAGNOSIS — K7031 Alcoholic cirrhosis of liver with ascites: Secondary | ICD-10-CM | POA: Diagnosis not present

## 2015-08-10 DIAGNOSIS — R188 Other ascites: Secondary | ICD-10-CM | POA: Diagnosis not present

## 2015-08-10 NOTE — Procedures (Signed)
  Ultrasound-guided therapeutic paracentesis performed yielding 8.6 liters of cloudy yellow colored fluid. No immediate complications.  Ting Cage E 08/10/2015

## 2015-08-13 ENCOUNTER — Other Ambulatory Visit (HOSPITAL_COMMUNITY): Payer: Self-pay | Admitting: Internal Medicine

## 2015-08-13 DIAGNOSIS — K7031 Alcoholic cirrhosis of liver with ascites: Secondary | ICD-10-CM

## 2015-08-20 ENCOUNTER — Ambulatory Visit (HOSPITAL_COMMUNITY)
Admission: RE | Admit: 2015-08-20 | Discharge: 2015-08-20 | Disposition: A | Discharge status: Home / Self Care | Source: Ambulatory Visit | Attending: Internal Medicine | Admitting: Internal Medicine

## 2015-08-20 DIAGNOSIS — K7031 Alcoholic cirrhosis of liver with ascites: Secondary | ICD-10-CM | POA: Insufficient documentation

## 2015-08-20 NOTE — Procedures (Signed)
Ultrasound-guided  therapeutic paracentesis performed yielding 9 liters (maximum ordered) of turbid, light yellow  fluid. No immediate complications.

## 2015-08-21 ENCOUNTER — Ambulatory Visit (HOSPITAL_COMMUNITY)

## 2015-08-24 ENCOUNTER — Other Ambulatory Visit: Payer: Self-pay | Admitting: Interventional Radiology

## 2015-08-24 DIAGNOSIS — K7031 Alcoholic cirrhosis of liver with ascites: Secondary | ICD-10-CM

## 2015-08-27 ENCOUNTER — Ambulatory Visit (HOSPITAL_COMMUNITY)
Admission: RE | Admit: 2015-08-27 | Discharge: 2015-08-27 | Disposition: A | Discharge status: Home / Self Care | Source: Ambulatory Visit | Attending: Internal Medicine | Admitting: Internal Medicine

## 2015-08-27 ENCOUNTER — Other Ambulatory Visit: Payer: Self-pay | Admitting: Internal Medicine

## 2015-08-27 DIAGNOSIS — K7031 Alcoholic cirrhosis of liver with ascites: Secondary | ICD-10-CM | POA: Insufficient documentation

## 2015-08-27 NOTE — Procedures (Signed)
Ultrasound-guided diagnostic and therapeutic paracentesis performed yielding 9.5 liters of turbid, light yellow  fluid. No immediate complications. A portion of the fluid was sent to the lab for preordered studies.

## 2015-08-28 ENCOUNTER — Ambulatory Visit (HOSPITAL_COMMUNITY)

## 2015-08-28 LAB — PH, BODY FLUID: PH, BODY FLUID: 7.6

## 2015-08-29 LAB — CEA (CARCINOEMBRYONIC ANTIGEN), FLUID: CEA Fluid: <0.5 ng/mL (ref <10.0)

## 2015-09-03 ENCOUNTER — Ambulatory Visit (HOSPITAL_COMMUNITY)
Admission: RE | Admit: 2015-09-03 | Discharge: 2015-09-03 | Disposition: A | Discharge status: Home / Self Care | Source: Ambulatory Visit | Attending: Internal Medicine | Admitting: Internal Medicine

## 2015-09-03 DIAGNOSIS — R188 Other ascites: Secondary | ICD-10-CM | POA: Insufficient documentation

## 2015-09-03 DIAGNOSIS — K7031 Alcoholic cirrhosis of liver with ascites: Secondary | ICD-10-CM | POA: Insufficient documentation

## 2015-09-03 NOTE — Procedures (Signed)
Ultrasound-guided  therapeutic paracentesis performed yielding 9.5  liters of turbid, light yellow fluid. No immediate complications. Please contact IR clinic at [phone] if aspira drain consult needed.

## 2015-09-06 ENCOUNTER — Other Ambulatory Visit

## 2015-09-12 DEATH — deceased

## 2017-04-09 IMAGING — US US PARACENTESIS
1 series · 4 of 4 positions shown · non-contrast
Comparison: Paracentesis 03/21/15.

MEDICATIONS:
None.

COMPLICATIONS:
None immediate

INDICATION: Hepatitis C, cirrhosis, recurrent ascites and request for
paracentesis.

EXAM:
ULTRASOUND-GUIDED PARACENTESIS
TECHNIQUE: Informed written consent was obtained from the patient after a
discussion of the risks, benefits and alternatives to treatment. A
timeout was performed prior to the initiation of the procedure.

[Series 1: us paracentesis · 0.27mm/px · 4 of 4 slices shown]
[im 1/4]
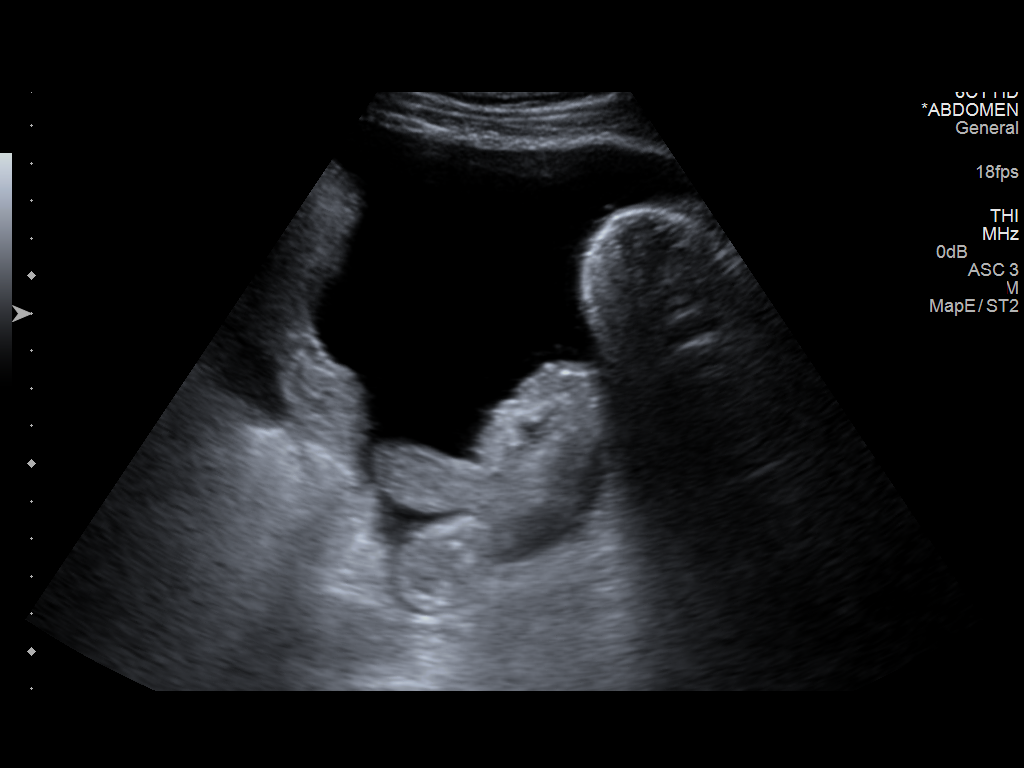
[im 2/4]
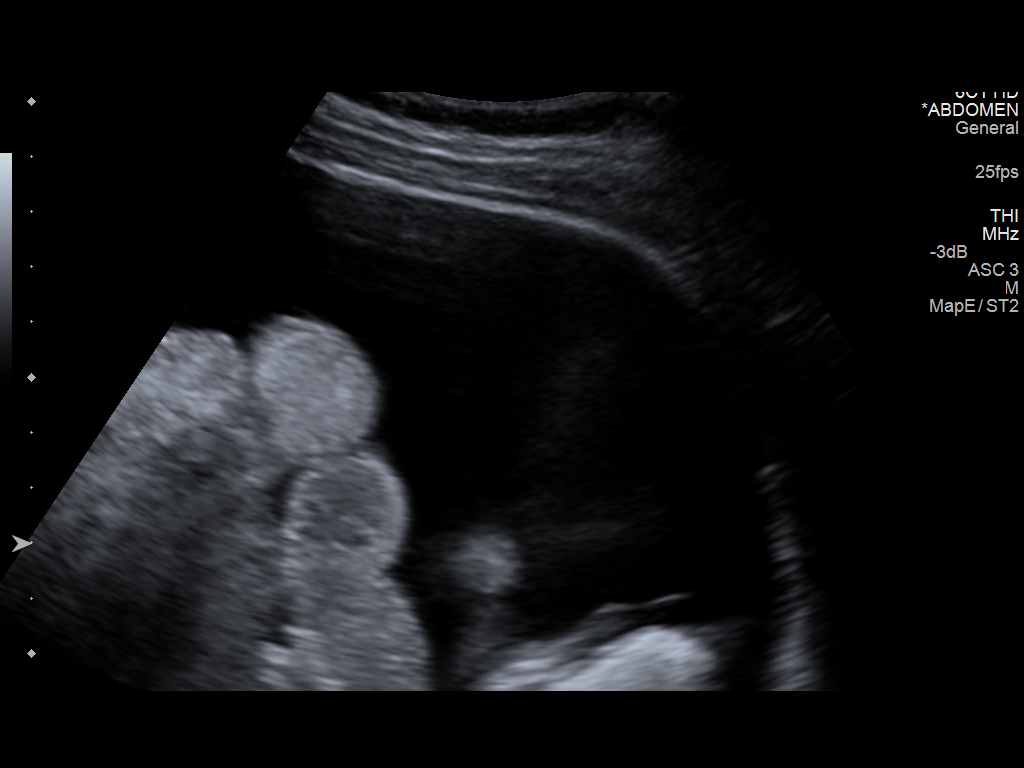
[im 3/4]
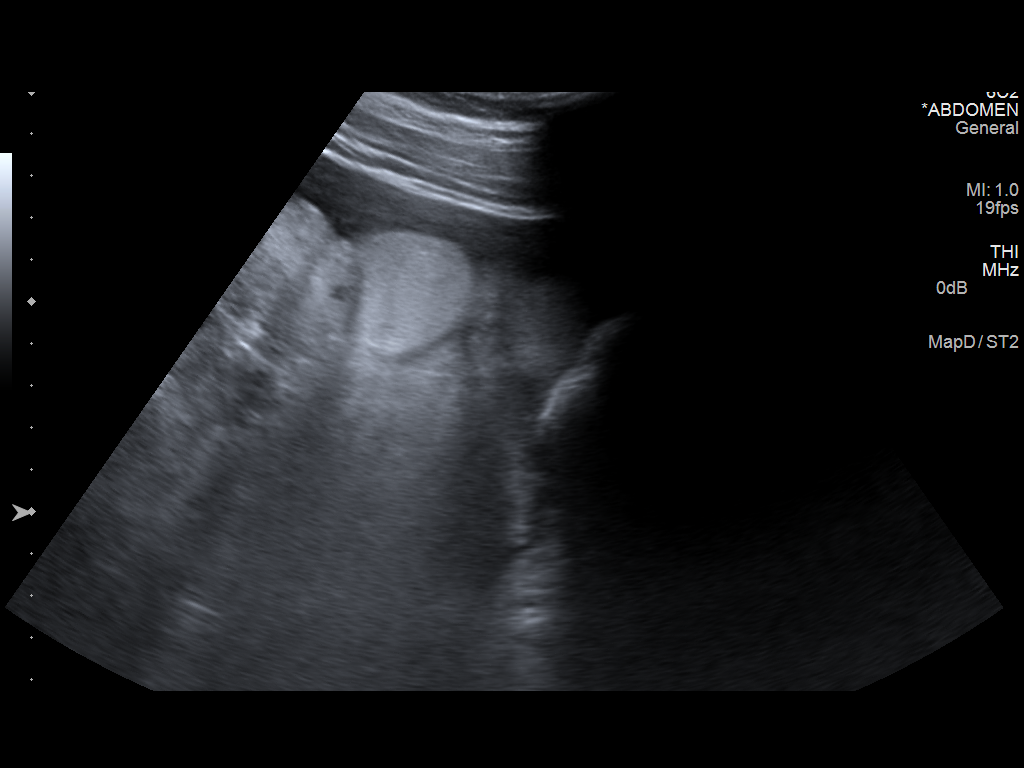
[im 4/4]
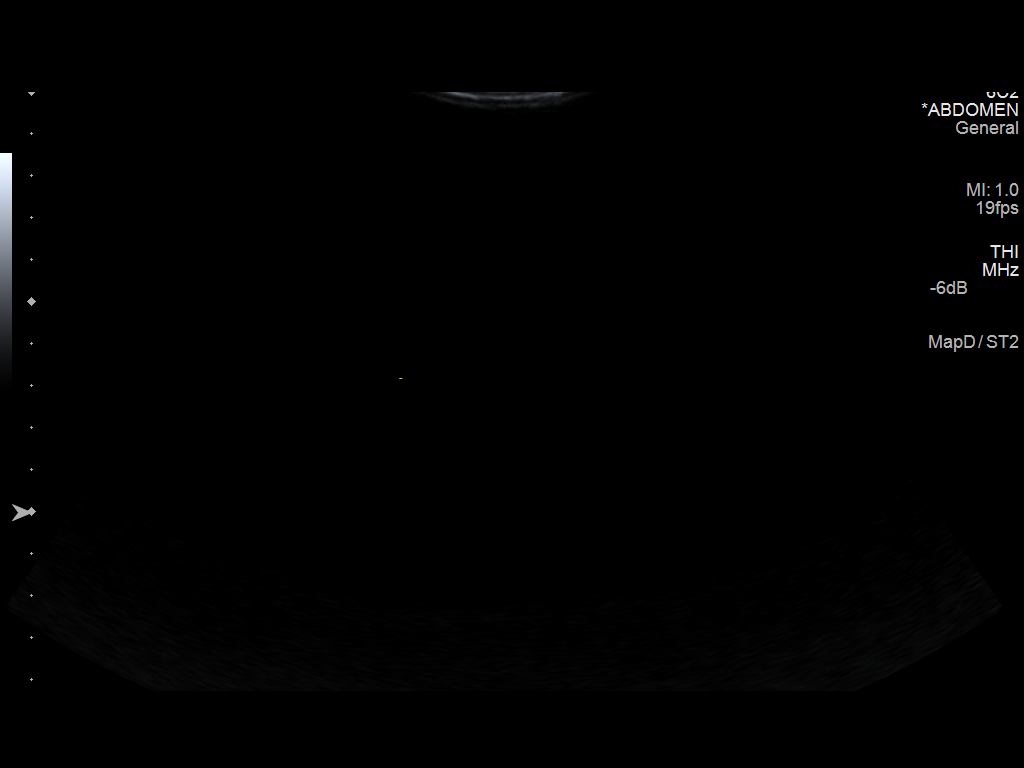

[4 of 4 positions shown; findings below may reference images not displayed]

Initial ultrasound scanning demonstrates a large amount of ascites
within the left lower abdominal quadrant. The left lower abdomen was
prepped and draped in the usual sterile fashion. 1% lidocaine with
epinephrine was used for local anesthesia.

Under direct ultrasound guidance, a 19 gauge, 10-cm, Yueh catheter
was introduced. An ultrasound image was saved for documentation
purposed. The paracentesis was performed. The catheter was removed
and a dressing was applied. The patient tolerated the procedure well
without immediate post procedural complication.
FINDINGS: A total of approximately 4.5 liters of serous fluid was removed.
IMPRESSION: Successful ultrasound-guided paracentesis yielding 4.5 liters of
peritoneal fluid.

## 2017-06-02 IMAGING — DX DG CHEST 2V
2 series · 2 of 2 positions shown · non-contrast
Comparison: February 12, 2015.

CLINICAL DATA: Chest pain, cough.

EXAM:
CHEST  2 VIEW

[chest pa]
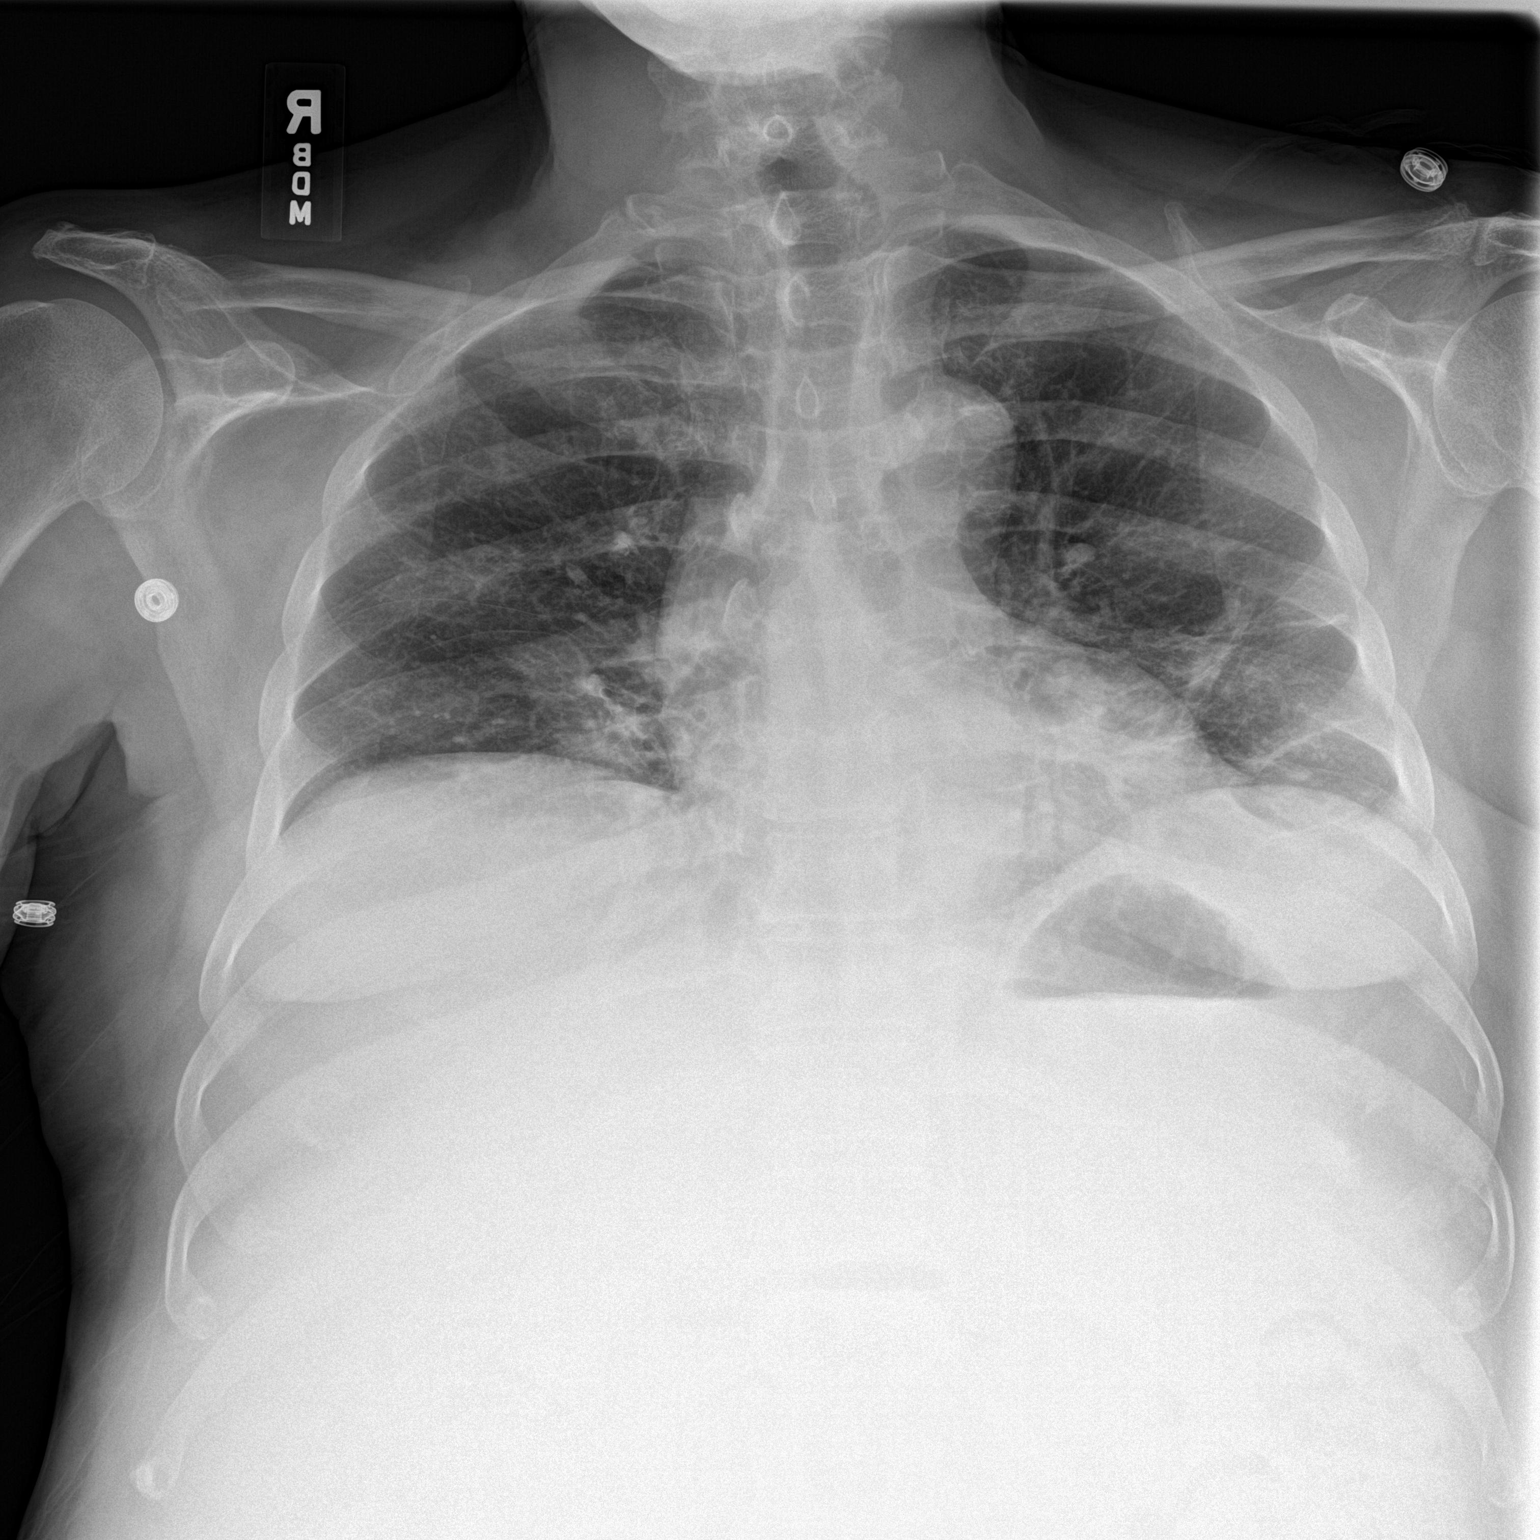

[chest lat]
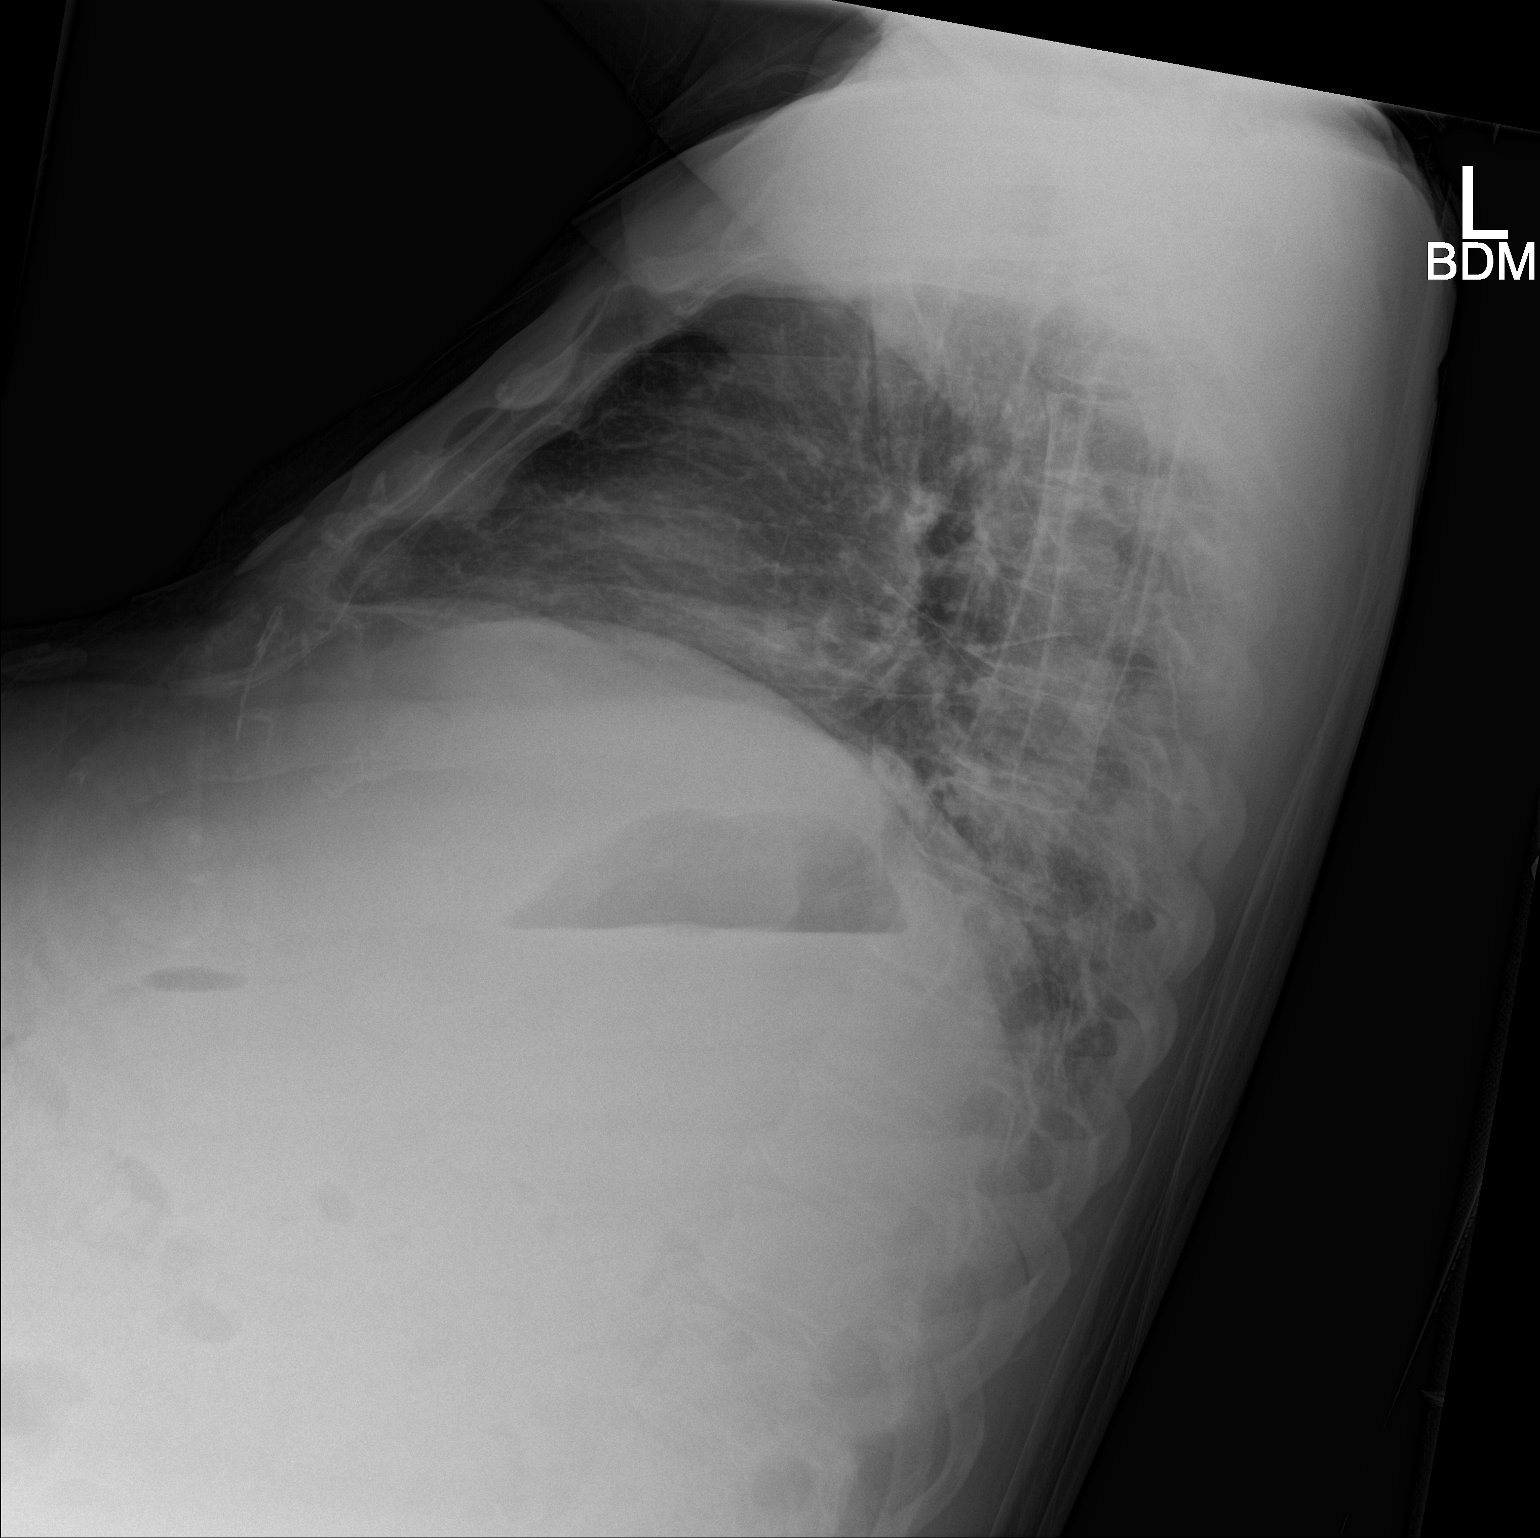

[2 of 2 positions shown; findings below may reference images not displayed]

FINDINGS: The heart size and mediastinal contours are within normal limits. No
pneumothorax or significant pleural effusion is noted right lung is
clear. Grossly stable linear densities are noted in left lung base
most consistent with scarring or subsegmental atelectasis. The
visualized skeletal structures are unremarkable.
IMPRESSION: Grossly stable left basilar linear densities are noted most
consistent with scarring or subsegmental atelectasis. No significant
changes noted compared to prior exam.
